# Patient Record
Sex: Female | Born: 1980 | Race: White | Hispanic: No | Smoking: Current every day smoker
Health system: Southern US, Community
[De-identification: ages and names within clinical notes are randomized; demographics above are authoritative.]

## PROBLEM LIST (undated history)

## (undated) DIAGNOSIS — F419 Anxiety disorder, unspecified: Secondary | ICD-10-CM

## (undated) DIAGNOSIS — B1920 Unspecified viral hepatitis C without hepatic coma: Secondary | ICD-10-CM

## (undated) DIAGNOSIS — F909 Attention-deficit hyperactivity disorder, unspecified type: Secondary | ICD-10-CM

## (undated) DIAGNOSIS — J45909 Unspecified asthma, uncomplicated: Secondary | ICD-10-CM

## (undated) DIAGNOSIS — F1199 Opioid use, unspecified with unspecified opioid-induced disorder: Secondary | ICD-10-CM

## (undated) DIAGNOSIS — K589 Irritable bowel syndrome without diarrhea: Secondary | ICD-10-CM

## (undated) DIAGNOSIS — F4310 Post-traumatic stress disorder, unspecified: Secondary | ICD-10-CM

## (undated) DIAGNOSIS — F1190 Opioid use, unspecified, uncomplicated: Secondary | ICD-10-CM

## (undated) HISTORY — DX: Opioid use, unspecified, uncomplicated: F11.90

## (undated) HISTORY — DX: Attention-deficit hyperactivity disorder, unspecified type: F90.9

## (undated) HISTORY — DX: Opioid use, unspecified with unspecified opioid-induced disorder: F11.99

---

## 2003-12-31 HISTORY — PX: BREAST REDUCTION SURGERY: SHX8

## 2005-12-23 ENCOUNTER — Emergency Department: Payer: Self-pay | Admitting: Emergency Medicine

## 2007-06-21 ENCOUNTER — Emergency Department: Payer: Self-pay | Admitting: Emergency Medicine

## 2007-11-07 ENCOUNTER — Emergency Department: Payer: Self-pay | Admitting: Unknown Physician Specialty

## 2008-01-15 ENCOUNTER — Emergency Department: Payer: Self-pay | Admitting: Emergency Medicine

## 2008-01-18 ENCOUNTER — Emergency Department: Payer: Self-pay | Admitting: Emergency Medicine

## 2008-01-29 ENCOUNTER — Ambulatory Visit: Payer: Self-pay | Admitting: Unknown Physician Specialty

## 2008-02-01 ENCOUNTER — Ambulatory Visit: Payer: Self-pay | Admitting: Unknown Physician Specialty

## 2008-02-13 ENCOUNTER — Emergency Department: Payer: Self-pay | Admitting: Emergency Medicine

## 2008-02-28 ENCOUNTER — Ambulatory Visit: Payer: Self-pay | Admitting: Unknown Physician Specialty

## 2008-03-30 ENCOUNTER — Ambulatory Visit: Payer: Self-pay | Admitting: Unknown Physician Specialty

## 2008-04-07 ENCOUNTER — Emergency Department: Payer: Self-pay | Admitting: Emergency Medicine

## 2008-04-07 ENCOUNTER — Other Ambulatory Visit: Payer: Self-pay

## 2009-03-02 ENCOUNTER — Emergency Department (HOSPITAL_COMMUNITY): Admission: EM | Admit: 2009-03-02 | Discharge: 2009-03-02 | Payer: Self-pay | Admitting: Emergency Medicine

## 2010-05-05 ENCOUNTER — Inpatient Hospital Stay: Payer: Self-pay | Admitting: Psychiatry

## 2010-08-31 ENCOUNTER — Emergency Department (HOSPITAL_COMMUNITY): Admission: EM | Admit: 2010-08-31 | Discharge: 2010-08-31 | Payer: Self-pay | Admitting: Family Medicine

## 2010-09-01 ENCOUNTER — Emergency Department (HOSPITAL_COMMUNITY): Admission: EM | Admit: 2010-09-01 | Discharge: 2010-09-01 | Payer: Self-pay | Admitting: Family Medicine

## 2011-01-24 ENCOUNTER — Ambulatory Visit (HOSPITAL_COMMUNITY)
Admission: RE | Admit: 2011-01-24 | Discharge: 2011-01-24 | Payer: Self-pay | Source: Home / Self Care | Attending: Family Medicine | Admitting: Family Medicine

## 2011-01-30 ENCOUNTER — Ambulatory Visit: Admit: 2011-01-30 | Payer: Self-pay | Admitting: Obstetrics & Gynecology

## 2011-01-30 ENCOUNTER — Other Ambulatory Visit: Payer: Self-pay | Admitting: Obstetrics & Gynecology

## 2011-01-30 ENCOUNTER — Encounter: Payer: Self-pay | Admitting: Obstetrics & Gynecology

## 2011-01-30 ENCOUNTER — Other Ambulatory Visit (HOSPITAL_COMMUNITY)
Admission: RE | Admit: 2011-01-30 | Discharge: 2011-01-30 | Disposition: A | Discharge status: Home / Self Care | Payer: Self-pay | Source: Ambulatory Visit | Attending: Obstetrics & Gynecology | Admitting: Obstetrics & Gynecology

## 2011-01-30 DIAGNOSIS — Z01419 Encounter for gynecological examination (general) (routine) without abnormal findings: Secondary | ICD-10-CM | POA: Insufficient documentation

## 2011-02-13 ENCOUNTER — Emergency Department (HOSPITAL_COMMUNITY)
Admission: EM | Admit: 2011-02-13 | Discharge: 2011-02-14 | Disposition: A | Discharge status: Home / Self Care | Payer: Self-pay | Attending: Emergency Medicine | Admitting: Emergency Medicine

## 2011-02-13 DIAGNOSIS — R1110 Vomiting, unspecified: Secondary | ICD-10-CM | POA: Insufficient documentation

## 2011-02-13 DIAGNOSIS — F1910 Other psychoactive substance abuse, uncomplicated: Secondary | ICD-10-CM | POA: Insufficient documentation

## 2011-02-13 DIAGNOSIS — R1013 Epigastric pain: Secondary | ICD-10-CM | POA: Insufficient documentation

## 2011-02-13 LAB — URINALYSIS, ROUTINE W REFLEX MICROSCOPIC
Hgb urine dipstick: NEGATIVE
Ketones, ur: NEGATIVE mg/dL
Protein, ur: 30 mg/dL — AB
Urine Glucose, Fasting: NEGATIVE mg/dL

## 2011-02-13 LAB — DIFFERENTIAL
Basophils Absolute: 0 10*3/uL (ref 0.0–0.1)
Basophils Relative: 1 % (ref 0–1)
Eosinophils Absolute: 0.1 10*3/uL (ref 0.0–0.7)
Monocytes Absolute: 0.6 10*3/uL (ref 0.1–1.0)
Monocytes Relative: 13 % — ABNORMAL HIGH (ref 3–12)
Neutrophils Relative %: 45 % (ref 43–77)

## 2011-02-13 LAB — COMPREHENSIVE METABOLIC PANEL
Albumin: 4 g/dL (ref 3.5–5.2)
BUN: 12 mg/dL (ref 6–23)
Creatinine, Ser: 0.69 mg/dL (ref 0.4–1.2)
Total Protein: 7.1 g/dL (ref 6.0–8.3)

## 2011-02-13 LAB — CBC
MCH: 31.6 pg (ref 26.0–34.0)
MCHC: 34.8 g/dL (ref 30.0–36.0)
Platelets: 364 10*3/uL (ref 150–400)
RBC: 4.31 MIL/uL (ref 3.87–5.11)

## 2011-02-13 LAB — RAPID URINE DRUG SCREEN, HOSP PERFORMED
Barbiturates: NOT DETECTED
Benzodiazepines: NOT DETECTED
Cocaine: NOT DETECTED
Opiates: NOT DETECTED

## 2011-02-13 LAB — PREGNANCY, URINE: Preg Test, Ur: NEGATIVE

## 2011-02-13 LAB — URINE MICROSCOPIC-ADD ON

## 2011-02-13 LAB — ETHANOL: Alcohol, Ethyl (B): 8 mg/dL (ref 0–10)

## 2011-02-14 DIAGNOSIS — F1010 Alcohol abuse, uncomplicated: Secondary | ICD-10-CM

## 2011-03-22 NOTE — Progress Notes (Signed)
NAME:  Taylor Wiggins, Taylor Wiggins NO.:  [id number]  MEDICAL RECORD NO.:  [id number]           PATIENT TYPE:  A  LOCATION:  [facility]                   FACILITY:  [facility]  PHYSICIAN:  Scheryl Darter, MD       DATE OF BIRTH:  [birth year]  DATE OF SERVICE:                                 CLINIC NOTE  The patient comes to establish care for Pap smears and to discuss contraception.  The patient is a 30 year old white female gravida 1, para 0-0-1-0, last menstrual period December 24, 2010.  She was using condoms for contraception.  She is only having intercourse infrequently and four times she had abstaining from sex.  She had looked into having Mirena, but since she is not delivered a baby yet, she would prefer to not use that method.  PAST MEDICAL HISTORY: 1. Asthma. 2. Depression. 3. Heart murmur.  PAST SURGICAL HISTORY: 1. Breast reduction. 2. Termination of pregnancy 4 years ago.  MEDICATIONS:  None.  No known drug allergies.  Family history of alcoholism and diabetes, blood clots in her grandmother and heart disease and hypertension.  SOCIAL HISTORY:  The patient is single.  She currently lives with friends.  She smokes about half pack of cigarettes a day.  She denies alcohol or drug use.  She used to drink alcohol and has quit.  REVIEW OF SYSTEMS:  She is somewhat irregular, but only occasionally a weekly.  Periods are not heavy.  She occasionally needs to take an Advil because of cramps.  No abnormal discharge.  No urinary symptoms.  PHYSICAL EXAMINATION:  GENERAL:  Patient in no acute distress.  Affect appears normal. VITAL SIGNS:  Weight is 132.1 pounds, blood pressure 107/80, pulse 78, temperature 98.6. CHEST:  Clear. HEART:  Regular rhythm.  No thyromegaly. BREASTS:  Symmetric with well-healed scars from breast reduction surgery.  No masses or tenderness. ABDOMEN:  Flat, soft, nontender.  No mass. EXTREMITIES:  Show no swelling. PELVIC:  External  genitalia, vagina and cervix appeared normal.  Pap was done.  Uterus normal size.  No adnexal masses or tenderness.  IMPRESSION: 1. Normal gynecologic exam. 2. Desire for contraception.  PLAN:  We discussed different methods of contraception including hormonal method.  She says she is satisfied with using condoms at this time.  I recommended she use spermicide as well.  Also recommend that if she does get on another method of birth control like oral contraceptives, she should continue to use condoms to reduce the risk of sexually transmitted diseases.  She should have yearly exams and Pap test.  She will be notified of the result of today's Pap.     Scheryl Darter, MD    JA/MEDQ  D:  01/30/2011  T:  01/31/2011  Job:  [id number]

## 2011-04-11 LAB — RAPID URINE DRUG SCREEN, HOSP PERFORMED
Barbiturates: NOT DETECTED
Benzodiazepines: NOT DETECTED

## 2011-04-11 LAB — CBC
HCT: 39.5 % (ref 36.0–46.0)
Hemoglobin: 13.7 g/dL (ref 12.0–15.0)
MCV: 95.6 fL (ref 78.0–100.0)
Platelets: 367 10*3/uL (ref 150–400)
RBC: 4.13 MIL/uL (ref 3.87–5.11)
WBC: 5.3 10*3/uL (ref 4.0–10.5)

## 2011-04-11 LAB — POCT I-STAT, CHEM 8
Calcium, Ion: 1.07 mmol/L — ABNORMAL LOW (ref 1.12–1.32)
Creatinine, Ser: 0.8 mg/dL (ref 0.4–1.2)
Glucose, Bld: 87 mg/dL (ref 70–99)
Hemoglobin: 14.6 g/dL (ref 12.0–15.0)
TCO2: 27 mmol/L (ref 0–100)

## 2011-04-11 LAB — DIFFERENTIAL
Eosinophils Absolute: 0 10*3/uL (ref 0.0–0.7)
Eosinophils Relative: 0 % (ref 0–5)
Lymphs Abs: 2 10*3/uL (ref 0.7–4.0)
Monocytes Absolute: 0.4 10*3/uL (ref 0.1–1.0)
Monocytes Relative: 7 % (ref 3–12)

## 2011-04-11 LAB — URINALYSIS, ROUTINE W REFLEX MICROSCOPIC
Glucose, UA: NEGATIVE mg/dL
Protein, ur: NEGATIVE mg/dL

## 2011-05-17 ENCOUNTER — Other Ambulatory Visit: Payer: Self-pay | Admitting: Obstetrics and Gynecology

## 2011-05-17 ENCOUNTER — Ambulatory Visit (INDEPENDENT_AMBULATORY_CARE_PROVIDER_SITE_OTHER): Payer: Self-pay | Admitting: Obstetrics and Gynecology

## 2011-05-17 DIAGNOSIS — Z7251 High risk heterosexual behavior: Secondary | ICD-10-CM

## 2011-05-17 NOTE — Group Therapy Note (Signed)
NAMEALEGRA, ROST NO.:  [id number]  MEDICAL RECORD NO.:  [id number]           PATIENT TYPE:  A  LOCATION:  [facility]                   FACILITY:  [facility]  PHYSICIAN:  Catalina Antigua, MD     DATE OF BIRTH:  10-05-1981  DATE OF SERVICE:  05/17/2011                                 CLINIC NOTE  HISTORY OF PRESENT ILLNESS:  This is a 30 year old G1, P 0-0-1-0 with an LMP of March 16, 2011 who presents today requesting STD testing and discussion of birth control.  She is currently sexually active using condoms occasionally but does desire the use of other forms of birth control, particularly the oral contraceptive pills.  The patient is also requesting the evaluation of a cyst on her groin and states it has been present for the past couple of weeks but has progressively been decreasing in size and is nontender to touch.  PHYSICAL EXAMINATION:  VITAL SIGNS:  Her blood pressure is 110/81, pulse of 91, weight of 59.6 kg, and height of 67-1/2 inches. ABDOMEN:  Soft, nontender, and nondistended. PELVIC:  She has a 5-mm lesion located on the right aspect of her mons on the inferior aspect that is consistent with her ingrown hair.  It is nontender to touch, nonfluctuant.  She had normal vaginal mucosa and normal-appearing cervix.  No abnormal bleeding or discharge.  ASSESSMENT AND PLAN:  This is a 30 year old G1, P 0-0-1-0 who is here for initiation of birth control and STD testing.  GC and Chlamydia cultures were collected.  Blood work will be collected for hepatitis B, hepatitis C, syphilis, and HIV testing.  The patient does report that she has been under a lot of stress lately which may explain her irregular period as her urine pregnancy test has been negative.  The patient desires to be initiated on oral contraceptive pills.  The patient has no risk factors and therefore a prescription for Cyclessa is provided.  The patient will follow up on a p.r.n. basis and in  February for her annual exam.          ______________________________ Catalina Antigua, MD    PC/MEDQ  D:  05/17/2011  T:  05/17/2011  Job:  [id number]

## 2011-07-23 ENCOUNTER — Other Ambulatory Visit (HOSPITAL_COMMUNITY): Admission: RE | Admit: 2011-07-23 | Payer: Self-pay | Source: Ambulatory Visit | Admitting: Internal Medicine

## 2011-07-23 ENCOUNTER — Other Ambulatory Visit: Payer: Self-pay | Admitting: Family Medicine

## 2011-07-29 ENCOUNTER — Telehealth: Payer: Self-pay

## 2011-07-29 NOTE — Telephone Encounter (Signed)
Called pt and left message.  Pt called and said she was returning our call.

## 2011-08-01 NOTE — Telephone Encounter (Signed)
Pt never returned our call and no notes documented in the chart indicating what the call could be about.  Message left to call if any further concerns.

## 2012-02-15 ENCOUNTER — Emergency Department: Payer: Self-pay | Admitting: Emergency Medicine

## 2012-02-15 LAB — COMPREHENSIVE METABOLIC PANEL
Alkaline Phosphatase: 43 U/L — ABNORMAL LOW (ref 50–136)
Anion Gap: 15 (ref 7–16)
BUN: 11 mg/dL (ref 7–18)
Chloride: 104 mmol/L (ref 98–107)
Co2: 23 mmol/L (ref 21–32)
Creatinine: 0.59 mg/dL — ABNORMAL LOW (ref 0.60–1.30)
EGFR (African American): >60
EGFR (Non-African Amer.): >60
Potassium: 3.7 mmol/L (ref 3.5–5.1)
SGOT(AST): 27 U/L (ref 15–37)
Sodium: 142 mmol/L (ref 136–145)
Total Protein: 8.1 g/dL (ref 6.4–8.2)

## 2012-02-15 LAB — DRUG SCREEN, URINE
Amphetamines, Ur Screen: NEGATIVE (ref ?–1000)
Cocaine Metabolite,Ur ~~LOC~~: NEGATIVE (ref ?–300)
MDMA (Ecstasy)Ur Screen: NEGATIVE (ref ?–500)
Opiate, Ur Screen: NEGATIVE (ref ?–300)
Phencyclidine (PCP) Ur S: NEGATIVE (ref ?–25)
Tricyclic, Ur Screen: NEGATIVE (ref ?–1000)

## 2012-02-15 LAB — CBC
HCT: 39.1 % (ref 35.0–47.0)
MCV: 97 fL (ref 80–100)
Platelet: 367 10*3/uL (ref 150–440)
RBC: 4.04 10*6/uL (ref 3.80–5.20)
WBC: 10.1 10*3/uL (ref 3.6–11.0)

## 2012-02-15 LAB — ETHANOL
Ethanol %: 0.199 % — ABNORMAL HIGH (ref 0.000–0.080)
Ethanol: 326 mg/dL
Ethanol: 199 mg/dL

## 2012-02-15 LAB — SALICYLATE LEVEL: Salicylates, Serum: <1.7 mg/dL

## 2012-02-15 LAB — TSH: Thyroid Stimulating Horm: 1.11 u[IU]/mL

## 2012-05-24 IMAGING — US US ABDOMEN COMPLETE
2 series · 13 of 25 positions shown · non-contrast
Comparison: None

CLINICAL DATA: Abdominal pain.

ABDOMINAL ULTRASOUND COMPLETE

[Series 1: us abdomen complete · 0.26mm/px · 12 of 66 slices shown (1 of 2)]
[im 1/66]
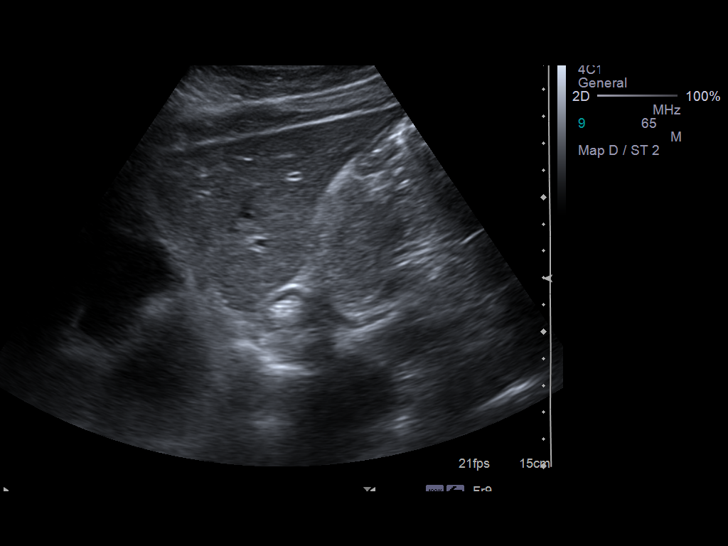
[im 6/66]
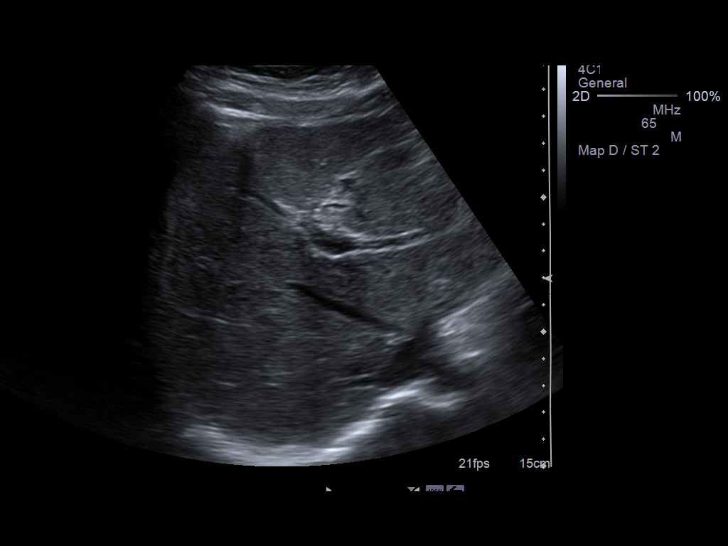
[im 12/66]
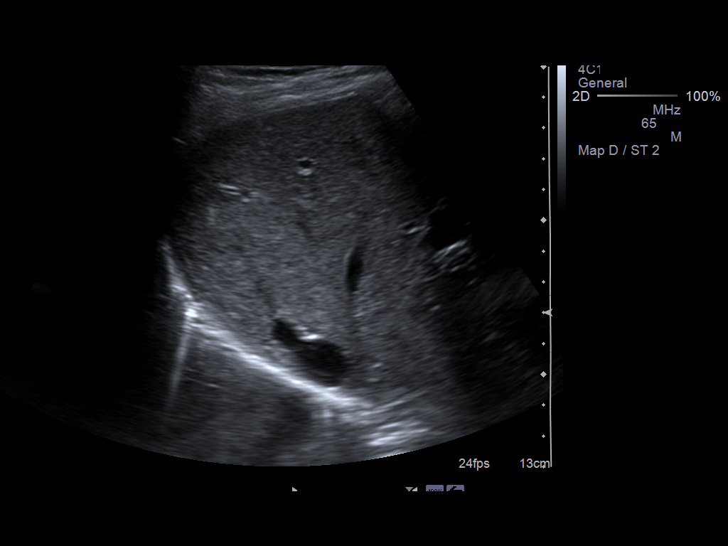
[im 17/66]
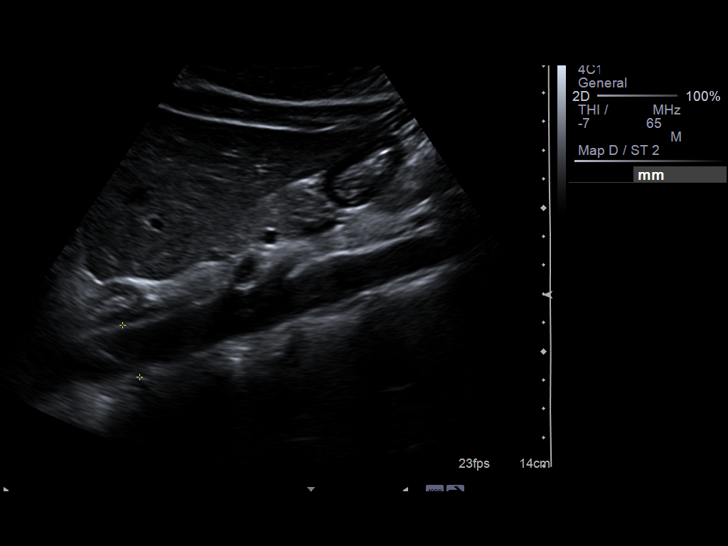
[im 23/66]
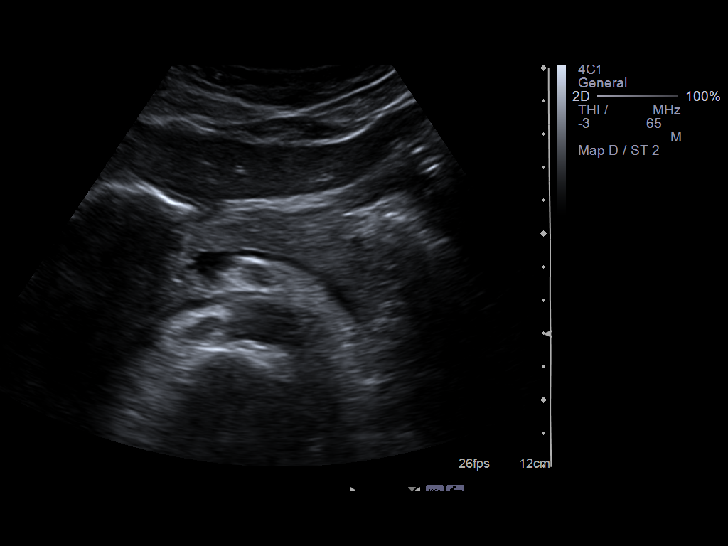
[im 29/66]
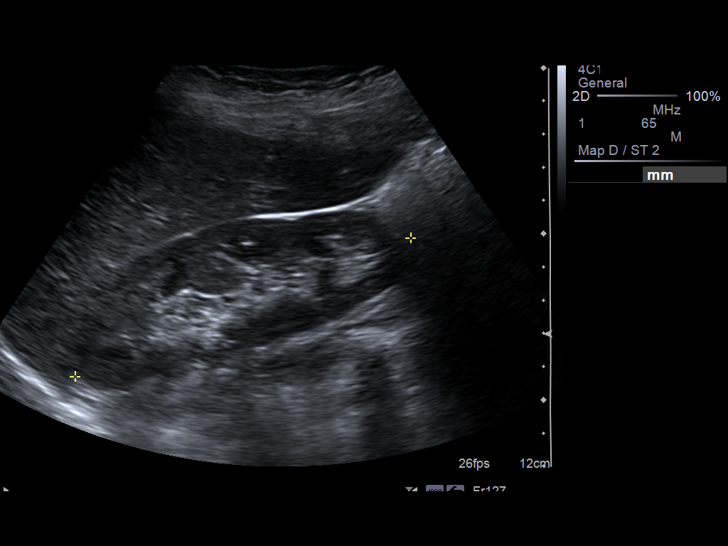
[im 34/66]
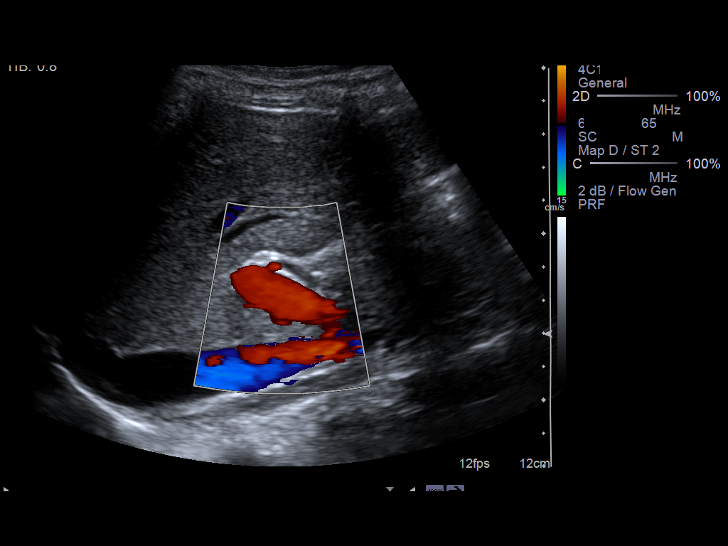
[im 40/66]
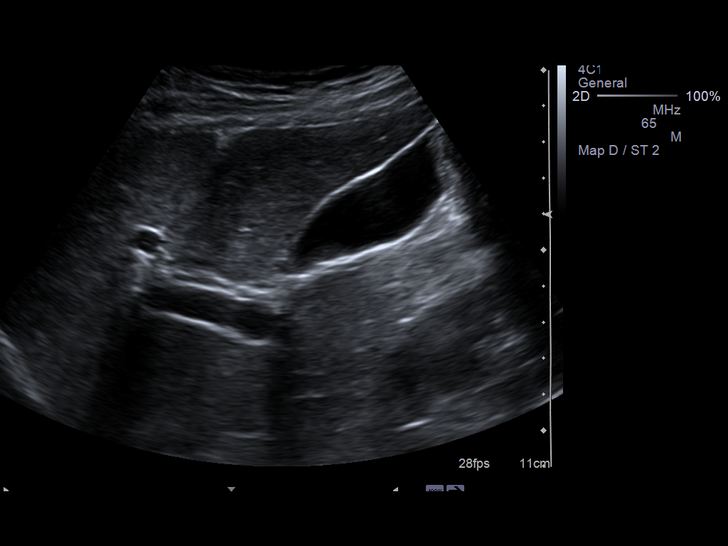
[im 46/66]
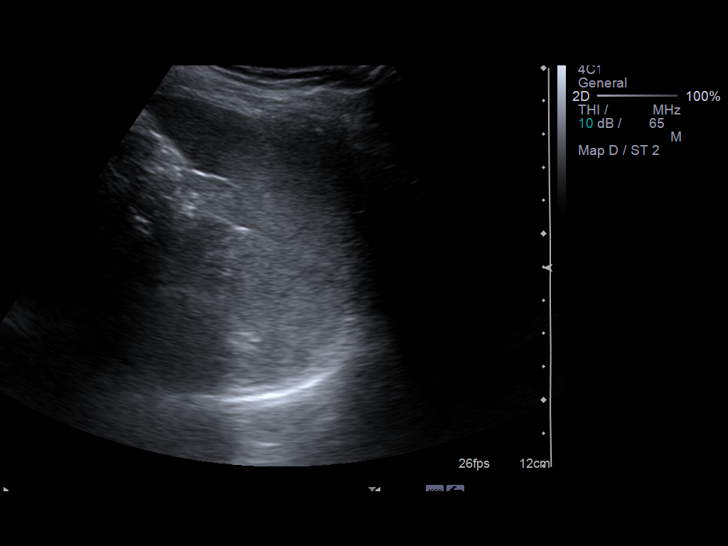
[im 51/66]
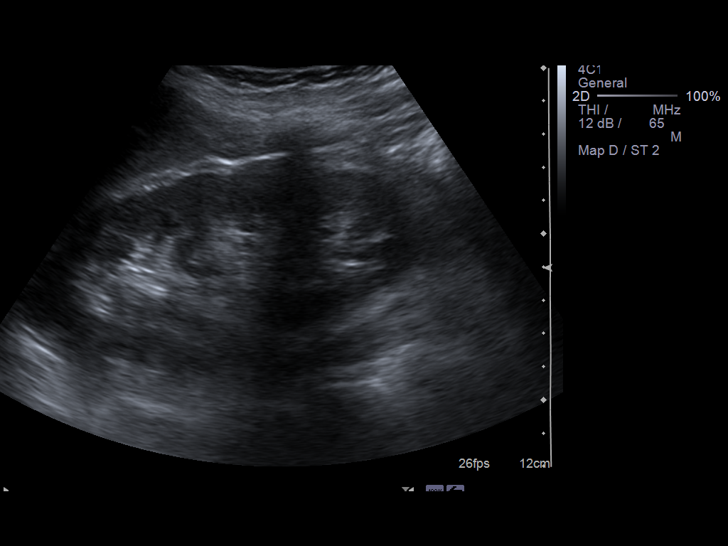
[im 57/66]
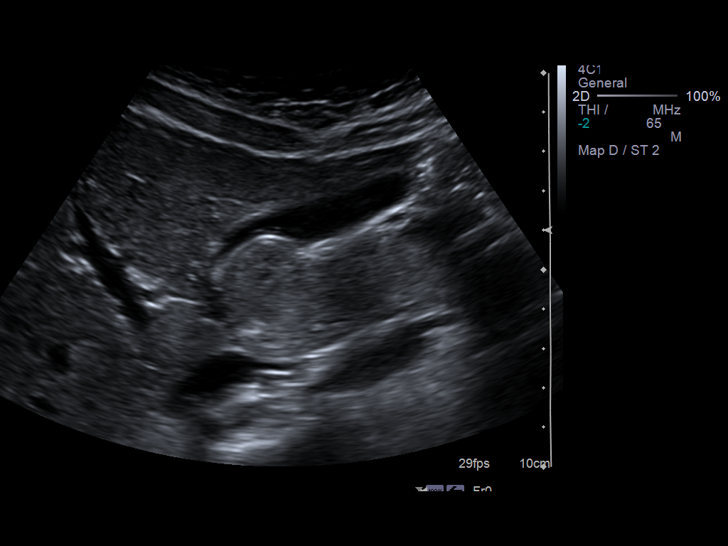
[im 63/66]
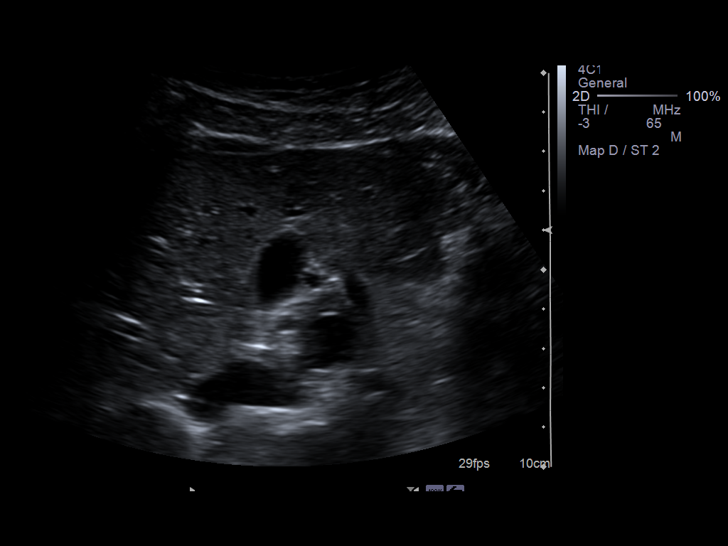

[Series 2: us abdomen complete · 0.21mm/px · 1 of 2 slices shown (2 of 2)]
[im 1/2]
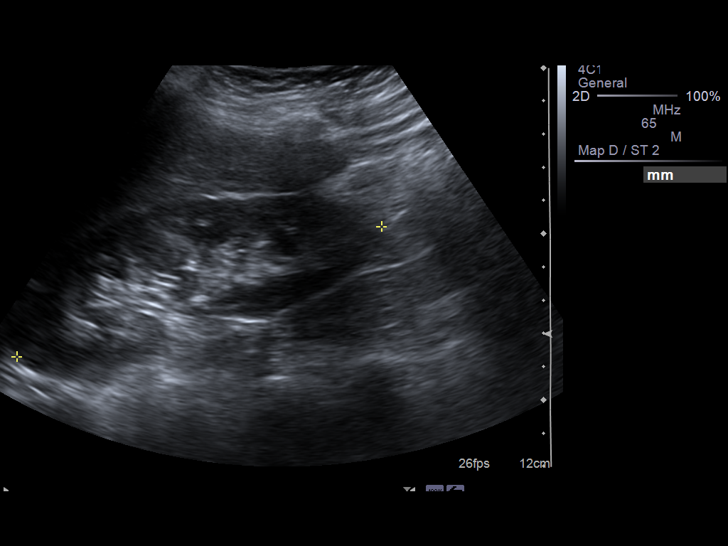

[13 of 25 positions shown; findings below may reference images not displayed]

FINDINGS: Gallbladder:  The gallbladder is unremarkable. There is no evidence
of gallstones, gallbladder wall thickening, or pericholecystic
fluid.

Common Bile Duct:  There is no evidence of intrahepatic or
extrahepatic biliary dilation. The CBD measures 3.5 mm in greatest
diameter.

Liver:  The liver is within normal limits in parenchymal
echogenicity. No focal abnormalities are identified.

IVC:  Appears normal.

Pancreas:  Although the pancreas is difficult to visualize in its
entirety, no focal pancreatic abnormality is identified.

Spleen:  Within normal limits in size and echotexture.

Right kidney:  The right kidney is normal in size and parenchymal
echogenicity.  There is no evidence of solid mass, hydronephrosis
or definite renal calculi.  The right kidney measures 11.7 cm.

Left kidney:  The left kidney is normal in size and parenchymal
echogenicity.  There is no evidence of solid mass, hydronephrosis
or definite renal calculi.  A 2 x 3 cm upper pole cyst is noted.
The left kidney measures 12.6 cm.

Abdominal Aorta:  No abdominal aortic aneurysm identified.

There is no evidence of ascites.
IMPRESSION: Left renal cyst otherwise negative abdominal
ultrasound.

## 2012-05-26 ENCOUNTER — Emergency Department: Payer: Self-pay | Admitting: Emergency Medicine

## 2012-08-24 ENCOUNTER — Encounter (HOSPITAL_COMMUNITY): Payer: Self-pay | Admitting: *Deleted

## 2012-08-24 ENCOUNTER — Emergency Department (INDEPENDENT_AMBULATORY_CARE_PROVIDER_SITE_OTHER)
Admission: EM | Admit: 2012-08-24 | Discharge: 2012-08-24 | Disposition: A | Discharge status: Home / Self Care | Payer: Self-pay | Source: Home / Self Care | Attending: Emergency Medicine | Admitting: Emergency Medicine

## 2012-08-24 DIAGNOSIS — M549 Dorsalgia, unspecified: Secondary | ICD-10-CM

## 2012-08-24 MED ORDER — CYCLOBENZAPRINE HCL 10 MG PO TABS: 10.0000 mg | ORAL_TABLET | Freq: Two times a day (BID) | ORAL | Status: AC | PRN

## 2012-08-24 MED ORDER — HYDROCODONE-ACETAMINOPHEN 5-325 MG PO TABS: 2.0000 | ORAL_TABLET | ORAL | Status: AC | PRN

## 2012-08-24 NOTE — ED Notes (Signed)
Pt reports " I moved a couch yesterday and I pulled something in my back"

## 2012-08-24 NOTE — ED Provider Notes (Signed)
History     CSN: [id number]  Arrival date & time 08/24/12  1556   First MD Initiated Contact with Patient 08/24/12 1757      Chief Complaint  Patient presents with  . Back Pain    (Consider location/radiation/quality/duration/timing/severity/associated sxs/prior treatment) Patient is a 31 y.o. female presenting with back pain. The history is provided by the patient. No language interpreter was used.  Back Pain  This is a new problem. The current episode started yesterday. The problem occurs constantly. The problem has not changed since onset.The pain is associated with no known injury. The pain is present in the thoracic spine. The quality of the pain is described as aching. The pain is at a severity of 6/10. The pain is moderate. The pain is worse during the day. She has tried nothing for the symptoms. The treatment provided moderate relief.    History reviewed. No pertinent past medical history.  History reviewed. No pertinent past surgical history.  History reviewed. No pertinent family history.  History  Substance Use Topics  . Smoking status: Current Some Day Smoker -- 0.5 packs/day    Types: Cigarettes  . Smokeless tobacco: Not on file  . Alcohol Use: Yes     socially    OB History    Grav Para Term Preterm Abortions TAB SAB Ect Mult Living                  Review of Systems  Musculoskeletal: Positive for back pain.  All other systems reviewed and are negative.    Allergies  Review of patient's allergies indicates no known allergies.  Home Medications  No current outpatient prescriptions on file.  BP 119/80  Pulse 82  Temp 98.1 F (36.7 C) (Oral)  Resp 18  SpO2 99%  LMP 07/20/2012  Physical Exam  Nursing note and vitals reviewed. Constitutional: She is oriented to person, place, and time. She appears well-developed and well-nourished.  HENT:  Head: Normocephalic and atraumatic.  Right Ear: External ear normal.  Left Ear: External ear normal.    Eyes: Conjunctivae are normal. Pupils are equal, round, and reactive to light.  Neck: Normal range of motion. Neck supple.  Cardiovascular: Normal rate, regular rhythm and normal heart sounds.   Pulmonary/Chest: Effort normal and breath sounds normal.  Abdominal: Soft. Bowel sounds are normal.  Musculoskeletal: Normal range of motion.  Neurological: She is alert and oriented to person, place, and time. She has normal reflexes.  Skin: Skin is warm.  Psychiatric: She has a normal mood and affect.    ED Course  Procedures (including critical care time)  Labs Reviewed - No data to display No results found.   No diagnosis found.    MDM  Pt given flexeril and hydrocodone         Lonia Skinner [place], [place] 08/24/12 1829

## 2012-08-24 NOTE — ED Provider Notes (Signed)
Medical screening examination/treatment/procedure(s) were performed by non-physician practitioner and as supervising physician I was immediately available for consultation/collaboration.  Leslee Home, M.D.   Reuben Likes, MD 08/24/12 [phone]

## 2012-08-25 ENCOUNTER — Telehealth (HOSPITAL_COMMUNITY): Payer: Self-pay | Admitting: *Deleted

## 2012-08-25 NOTE — ED Notes (Signed)
Pt. called and said she is having itching from the Hydrocodone and its not helping the pain.  I told her I would call back.  Discussed with Dr. Lorenz Coaster and he was made aware the pt. 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  He said to tell pt. The itching is not necessarily an allergic reaction. It can be a side effect.  He suggests she take Benadryl before she has to take it, she can try breaking it in half but he is not giving anything stronger.  If not relieving her pain she can come back but he doubts they will give her anything stronger.  I called pt. and told her this.  She said she would try the Benadryl. Vassie Moselle 08/25/2012

## 2012-10-01 ENCOUNTER — Encounter (HOSPITAL_COMMUNITY): Payer: Self-pay | Admitting: *Deleted

## 2012-10-01 ENCOUNTER — Emergency Department (INDEPENDENT_AMBULATORY_CARE_PROVIDER_SITE_OTHER)
Admission: EM | Admit: 2012-10-01 | Discharge: 2012-10-01 | Disposition: A | Discharge status: Home / Self Care | Payer: Self-pay | Source: Home / Self Care

## 2012-10-01 DIAGNOSIS — M545 Low back pain: Secondary | ICD-10-CM

## 2012-10-01 MED ORDER — CYCLOBENZAPRINE HCL 5 MG PO TABS: 5.0000 mg | ORAL_TABLET | Freq: Two times a day (BID) | ORAL | Status: DC | PRN

## 2012-10-01 MED ORDER — HYDROCODONE-ACETAMINOPHEN 5-325 MG PO TABS: 1.0000 | ORAL_TABLET | ORAL | Status: DC | PRN

## 2012-10-01 NOTE — ED Notes (Signed)
Pt states that back pain has not gotten better since last visit and does not know what to do - ask for possible referral.

## 2012-10-01 NOTE — ED Provider Notes (Signed)
Medical screening examination/treatment/procedure(s) were performed by non-physician practitioner and as supervising physician I was immediately available for consultation/collaboration.  Raynald Blend, MD 10/01/12 1747

## 2012-10-01 NOTE — ED Provider Notes (Signed)
History     CSN: [id number]  Arrival date & time 10/01/12  1555   None     Chief Complaint  Patient presents with  . Back Pain    (Consider location/radiation/quality/duration/timing/severity/associated sxs/prior treatment) HPI Comments: 31 year old female with chief complaint of low back pain. She's had this pain for approximately one month. She was seen in the urgent care by Dr. Lorenz Coaster at the end of August and treated conservatively with narcotics and instructions. She has since been continuing to move at work sideways it aggravates her lower back pain. The pain is located across the lower lumbar para musculature is worse with bending stooping lifting carrying. She also states her some sharp pains that she down the left thigh. Denies numbness focal weakness, loss control of bowel or bladder. No known blunt trauma fall or other type injury.   History reviewed. No pertinent past medical history.  History reviewed. No pertinent past surgical history.  Family History  Problem Relation Age of Onset  . Family history unknown: Yes    History  Substance Use Topics  . Smoking status: Current Some Day Smoker -- 0.5 packs/day    Types: Cigarettes  . Smokeless tobacco: Not on file  . Alcohol Use: Yes     socially    OB History    Grav Para Term Preterm Abortions TAB SAB Ect Mult Living                  Review of Systems  Constitutional: Negative for fever, chills and activity change.  HENT: Negative.   Respiratory: Negative.   Cardiovascular: Negative.   Musculoskeletal:       As per HPI  Skin: Negative for color change, pallor and rash.  Neurological: Negative.     Allergies  Review of patient's allergies indicates no known allergies.  Home Medications   Current Outpatient Rx  Name Route Sig Dispense Refill  . CYCLOBENZAPRINE HCL 5 MG PO TABS Oral Take 1 tablet (5 mg total) by mouth 2 (two) times daily as needed for muscle spasms. 20 tablet 0  .  HYDROCODONE-ACETAMINOPHEN 5-325 MG PO TABS Oral Take 1 tablet by mouth every 4 (four) hours as needed for pain. 10 tablet 0    BP 117/79  Pulse 71  Temp 97.5 F (36.4 C) (Oral)  Resp 18  SpO2 99%  LMP 09/01/2012  Physical Exam  Constitutional: She is oriented to person, place, and time. She appears well-developed and well-nourished. No distress.  HENT:  Head: Normocephalic and atraumatic.  Eyes: EOM are normal. Pupils are equal, round, and reactive to light.  Neck: Normal range of motion. Neck supple.  Musculoskeletal:       Tender across the bilateral paralumbar musculature. No spinal tenderness. Flexion beyond 90 without pain. Lower extremities motor and sensory is intact  Lymphadenopathy:    She has no cervical adenopathy.  Neurological: She is alert and oriented to person, place, and time. No cranial nerve deficit.  Skin: Skin is warm and dry. No rash noted.    ED Course  Procedures (including critical care time)  Labs Reviewed - No data to display No results found.   1. Lumbago       MDM  I discussed stretches for her back. She states she does not really want to do that. She is willing to accept a referral. And she also wants something for pain. We'll refer her to on call orthopedist Dr. Ranell Patrick. She will benefit from physical therapy. To 1Q4  to 6 hours when necessary pain #10 Cyclobenzaprine 5 mg twice a day at home when necessary muscle spasms. Continued the ibuprofen or Aleve. No bending lifting pulling and lifting overhead or other movements exacerbate lower back pain.         Hayden Rasmussen, NP 10/01/12 1728

## 2012-10-19 ENCOUNTER — Encounter (HOSPITAL_COMMUNITY): Payer: Self-pay

## 2012-10-19 ENCOUNTER — Emergency Department (INDEPENDENT_AMBULATORY_CARE_PROVIDER_SITE_OTHER)
Admission: EM | Admit: 2012-10-19 | Discharge: 2012-10-19 | Disposition: A | Discharge status: Home / Self Care | Payer: Self-pay | Source: Home / Self Care | Attending: Family Medicine | Admitting: Family Medicine

## 2012-10-19 DIAGNOSIS — K219 Gastro-esophageal reflux disease without esophagitis: Secondary | ICD-10-CM

## 2012-10-19 MED ORDER — PANTOPRAZOLE SODIUM 40 MG PO TBEC: 40.0000 mg | DELAYED_RELEASE_TABLET | Freq: Every day | ORAL | Status: DC

## 2012-10-19 MED ORDER — GI COCKTAIL ~~LOC~~
ORAL | Status: AC
  Filled 2012-10-19: qty 30

## 2012-10-19 NOTE — ED Provider Notes (Signed)
History     CSN: [id number]  Arrival date & time 10/19/12  1621   First MD Initiated Contact with Patient 10/19/12 1637      Chief Complaint  Patient presents with  . Heartburn    (Consider location/radiation/quality/duration/timing/severity/associated sxs/prior treatment) Patient is a 31 y.o. female presenting with heartburn. The history is provided by the patient.  Heartburn This is a new problem. The current episode started more than 1 week ago. The problem has been gradually worsening. Associated symptoms include abdominal pain. The symptoms are aggravated by swallowing and stress (stil with back pain, under a lot of stress from a lot of issues at once.).    History reviewed. No pertinent past medical history.  History reviewed. No pertinent past surgical history.  No family history on file.  History  Substance Use Topics  . Smoking status: Current Some Day Smoker -- 0.5 packs/day    Types: Cigarettes  . Smokeless tobacco: Not on file  . Alcohol Use: Yes     socially    OB History    Grav Para Term Preterm Abortions TAB SAB Ect Mult Living                  Review of Systems  Constitutional: Negative.   Gastrointestinal: Positive for heartburn and abdominal pain.    Allergies  Review of patient's allergies indicates no known allergies.  Home Medications   Current Outpatient Rx  Name Route Sig Dispense Refill  . CYCLOBENZAPRINE HCL 5 MG PO TABS Oral Take 1 tablet (5 mg total) by mouth 2 (two) times daily as needed for muscle spasms. 20 tablet 0  . HYDROCODONE-ACETAMINOPHEN 5-325 MG PO TABS Oral Take 1 tablet by mouth every 4 (four) hours as needed for pain. 10 tablet 0  . PANTOPRAZOLE SODIUM 40 MG PO TBEC Oral Take 1 tablet (40 mg total) by mouth daily. 30 tablet 1    BP 113/77  Pulse 88  Temp 98.1 F (36.7 C) (Oral)  Resp 16  SpO2 100%  LMP 10/15/2012  Physical Exam  Nursing note and vitals reviewed. Constitutional: She appears well-developed  and well-nourished.  Abdominal: Soft. Normal appearance and bowel sounds are normal. She exhibits no distension and no mass. There is no hepatosplenomegaly. There is tenderness in the epigastric area. There is no rigidity, no rebound, no guarding, no CVA tenderness and no tenderness at McBurney's point.  Skin: Skin is warm and dry.    ED Course  Procedures (including critical care time)  Labs Reviewed - No data to display No results found.   1. GERD (gastroesophageal reflux disease)       MDM          Linna Hoff, MD 10/19/12 1930

## 2012-10-19 NOTE — ED Notes (Signed)
Patient states that she has been having acid reflux / heartburn sx since Saturday, has tried Zantac OTC with no relief, also states that she has lower back pain

## 2013-04-25 ENCOUNTER — Emergency Department (HOSPITAL_COMMUNITY)
Admission: EM | Admit: 2013-04-25 | Discharge: 2013-04-26 | Disposition: A | Discharge status: Other Acute Inpatient Hospital | Payer: Self-pay | Attending: Emergency Medicine | Admitting: Emergency Medicine

## 2013-04-25 ENCOUNTER — Encounter (HOSPITAL_COMMUNITY): Payer: Self-pay

## 2013-04-25 DIAGNOSIS — F17200 Nicotine dependence, unspecified, uncomplicated: Secondary | ICD-10-CM | POA: Insufficient documentation

## 2013-04-25 DIAGNOSIS — Z3202 Encounter for pregnancy test, result negative: Secondary | ICD-10-CM | POA: Insufficient documentation

## 2013-04-25 DIAGNOSIS — R45851 Suicidal ideations: Secondary | ICD-10-CM | POA: Insufficient documentation

## 2013-04-25 DIAGNOSIS — Z79899 Other long term (current) drug therapy: Secondary | ICD-10-CM | POA: Insufficient documentation

## 2013-04-25 DIAGNOSIS — F1910 Other psychoactive substance abuse, uncomplicated: Secondary | ICD-10-CM | POA: Insufficient documentation

## 2013-04-25 LAB — COMPREHENSIVE METABOLIC PANEL
AST: 18 U/L (ref 0–37)
Albumin: 4.2 g/dL (ref 3.5–5.2)
Calcium: 9.1 mg/dL (ref 8.4–10.5)
Chloride: 103 mEq/L (ref 96–112)
Creatinine, Ser: 0.7 mg/dL (ref 0.50–1.10)
Sodium: 138 mEq/L (ref 135–145)
Total Bilirubin: 0.1 mg/dL — ABNORMAL LOW (ref 0.3–1.2)

## 2013-04-25 LAB — CBC
MCV: 92.8 fL (ref 78.0–100.0)
Platelets: 432 10*3/uL — ABNORMAL HIGH (ref 150–400)
RDW: 13.5 % (ref 11.5–15.5)
WBC: 6.7 10*3/uL (ref 4.0–10.5)

## 2013-04-25 LAB — RAPID URINE DRUG SCREEN, HOSP PERFORMED
Amphetamines: NOT DETECTED
Cocaine: NOT DETECTED
Opiates: NOT DETECTED
Tetrahydrocannabinol: NOT DETECTED

## 2013-04-25 MED ORDER — NICOTINE 21 MG/24HR TD PT24
21.0000 mg | MEDICATED_PATCH | Freq: Every day | TRANSDERMAL | Status: DC
  Administered 2013-04-25: 21 mg via TRANSDERMAL
  Filled 2013-04-25: qty 1

## 2013-04-25 MED ORDER — ONDANSETRON HCL 4 MG PO TABS: 4.0000 mg | ORAL_TABLET | Freq: Three times a day (TID) | ORAL | Status: DC | PRN

## 2013-04-25 MED ORDER — ZOLPIDEM TARTRATE 5 MG PO TABS
5.0000 mg | ORAL_TABLET | Freq: Every evening | ORAL | Status: DC | PRN
  Administered 2013-04-26: 5 mg via ORAL
  Filled 2013-04-25: qty 1

## 2013-04-25 MED ORDER — IBUPROFEN 600 MG PO TABS: 600.0000 mg | ORAL_TABLET | Freq: Three times a day (TID) | ORAL | Status: DC | PRN

## 2013-04-25 MED ORDER — ACETAMINOPHEN 325 MG PO TABS: 650.0000 mg | ORAL_TABLET | ORAL | Status: DC | PRN

## 2013-04-25 MED ORDER — ALUM & MAG HYDROXIDE-SIMETH 200-200-20 MG/5ML PO SUSP: 30.0000 mL | ORAL | Status: DC | PRN

## 2013-04-25 NOTE — ED Provider Notes (Signed)
History     CSN: [id number]  Arrival date & time 04/25/13  2038   First MD Initiated Contact with Patient 04/25/13 2129      Chief Complaint  Patient presents with  . Medical Clearance    (Consider location/radiation/quality/duration/timing/severity/associated sxs/prior treatment) HPI Pt presenting with c/o alcohol and substance abuse as well as suicidal thoughts.  She states she was in a rehab program for 30 days and was doing better until she relapsed with substance use - states she has been drinking daily.  States she has suicidal thoughts and dreams, she states she feels she is not able to control these thoughts and is afraid she will act on them.  No definite plan.  Denies recent illness, no fever/cough/vomiting/abdominal pain or other symptoms.  There are no other associated systemic symptoms, there are no other alleviating or modifying factors.   History reviewed. No pertinent past medical history.  History reviewed. No pertinent past surgical history.  History reviewed. No pertinent family history.  History  Substance Use Topics  . Smoking status: Current Some Day Smoker -- 0.50 packs/day    Types: Cigarettes  . Smokeless tobacco: Not on file  . Alcohol Use: Yes     Comment: socially    OB History   Grav Para Term Preterm Abortions TAB SAB Ect Mult Living                  Review of Systems ROS reviewed and all otherwise negative except for mentioned in HPI  Allergies  Review of patient's allergies indicates no known allergies.  Home Medications   Current Outpatient Rx  Name  Route  Sig  Dispense  Refill  . diazepam (VALIUM) 2 MG tablet   Oral   Take 2 mg by mouth every 6 (six) hours as needed for anxiety.         . diphenhydrAMINE (BENADRYL ALLERGY) 25 MG tablet   Oral   Take 25 mg by mouth at bedtime as needed (sleep).           BP 112/86  Pulse 108  Temp(Src) 98.6 F (37 C) (Oral)  Resp 18  SpO2 95%  LMP 04/25/2013 Vitals  reviewed Physical Exam Physical Examination: General appearance - alert, well appearing, and in no distress Mental status - alert, oriented to person, place, and time Eyes - no scleral icterus, no conjunctival injection Mouth - mucous membranes moist, pharynx normal without lesions Chest - clear to auscultation, no wheezes, rales or rhonchi, symmetric air entry Heart - normal rate, regular rhythm, normal S1, S2, no murmurs, rubs, clicks or gallops Abdomen - soft, nontender, nondistended, no masses or organomegaly Extremities - peripheral pulses normal, no pedal edema, no clubbing or cyanosis Skin - normal coloration and turgor, no rashes Psych- cooperative, intermittently tearful  ED Course  Procedures (including critical care time)  10:36 PM pt moved to psych ED, d/w ACT team who will assess patient  Labs Reviewed  CBC - Abnormal; Notable for the following:    Platelets 432 (*)    All other components within normal limits  COMPREHENSIVE METABOLIC PANEL - Abnormal; Notable for the following:    Total Bilirubin 0.1 (*)    All other components within normal limits  ETHANOL - Abnormal; Notable for the following:    Alcohol, Ethyl (B) 257 (*)    All other components within normal limits  URINE RAPID DRUG SCREEN (HOSP PERFORMED)  POCT PREGNANCY, URINE   No results found.   1.  Substance abuse   2. Suicidal ideation       MDM  Pt presenting with c/o alcohol and benzo abuse, suicidal thoughts.  She is medically clear, d/w ACT who will see patient and evaluate.  Psych holding orders written and patient moved to psych ED.         Ethelda Chick, MD 04/26/13 [phone]

## 2013-04-25 NOTE — ED Notes (Signed)
Pt was recently released from a treatment center and started drinking again, she states that she feels like she wants to harm herself and she states that she's acting out in public and destroying her property. Pt wants long term inpatient help.

## 2013-04-25 NOTE — ED Notes (Signed)
Pt searched and wanded by security. Pt with 2 belongings bags, located in triage

## 2013-04-25 NOTE — ED Notes (Signed)
Pt to room; no s/s of distress noted at this time.

## 2013-04-26 ENCOUNTER — Inpatient Hospital Stay (HOSPITAL_COMMUNITY)
Admission: AD | Admit: 2013-04-26 | Discharge: 2013-04-28 | DRG: 897 | Disposition: A | Discharge status: Home / Self Care | Payer: No Typology Code available for payment source | Attending: Psychiatry | Admitting: Psychiatry

## 2013-04-26 ENCOUNTER — Encounter (HOSPITAL_COMMUNITY): Payer: Self-pay | Admitting: *Deleted

## 2013-04-26 ENCOUNTER — Encounter (HOSPITAL_COMMUNITY): Payer: Self-pay | Admitting: Emergency Medicine

## 2013-04-26 DIAGNOSIS — Z79899 Other long term (current) drug therapy: Secondary | ICD-10-CM

## 2013-04-26 DIAGNOSIS — R45851 Suicidal ideations: Secondary | ICD-10-CM

## 2013-04-26 DIAGNOSIS — F10239 Alcohol dependence with withdrawal, unspecified: Principal | ICD-10-CM | POA: Diagnosis present

## 2013-04-26 DIAGNOSIS — F10939 Alcohol use, unspecified with withdrawal, unspecified: Principal | ICD-10-CM | POA: Diagnosis present

## 2013-04-26 DIAGNOSIS — F1020 Alcohol dependence, uncomplicated: Secondary | ICD-10-CM | POA: Diagnosis present

## 2013-04-26 MED ORDER — HYDROXYZINE HCL 25 MG PO TABS
25.0000 mg | ORAL_TABLET | Freq: Four times a day (QID) | ORAL | Status: DC | PRN
  Administered 2013-04-26: 25 mg via ORAL
  Filled 2013-04-26: qty 1

## 2013-04-26 MED ORDER — MAGNESIUM HYDROXIDE 400 MG/5ML PO SUSP
30.0000 mL | Freq: Every day | ORAL | Status: DC | PRN
  Administered 2013-04-26: 30 mL via ORAL

## 2013-04-26 MED ORDER — ADULT MULTIVITAMIN W/MINERALS CH
1.0000 | ORAL_TABLET | Freq: Every day | ORAL | Status: DC
  Administered 2013-04-26 – 2013-04-28 (×3): 1 via ORAL
  Filled 2013-04-26 (×5): qty 1

## 2013-04-26 MED ORDER — CHLORDIAZEPOXIDE HCL 25 MG PO CAPS: 25.0000 mg | ORAL_CAPSULE | ORAL | Status: DC

## 2013-04-26 MED ORDER — ONDANSETRON 4 MG PO TBDP: 4.0000 mg | ORAL_TABLET | Freq: Four times a day (QID) | ORAL | Status: DC | PRN

## 2013-04-26 MED ORDER — CLONIDINE HCL 0.1 MG PO TABS: 0.1000 mg | ORAL_TABLET | Freq: Every day | ORAL | Status: DC

## 2013-04-26 MED ORDER — LOPERAMIDE HCL 2 MG PO CAPS: 2.0000 mg | ORAL_CAPSULE | ORAL | Status: DC | PRN

## 2013-04-26 MED ORDER — CHLORDIAZEPOXIDE HCL 25 MG PO CAPS
25.0000 mg | ORAL_CAPSULE | Freq: Three times a day (TID) | ORAL | Status: AC
  Administered 2013-04-27 (×2): 25 mg via ORAL
  Filled 2013-04-26 (×3): qty 1

## 2013-04-26 MED ORDER — CHLORDIAZEPOXIDE HCL 25 MG PO CAPS: 25.0000 mg | ORAL_CAPSULE | Freq: Every day | ORAL | Status: DC

## 2013-04-26 MED ORDER — CLONIDINE HCL 0.1 MG PO TABS: 0.1000 mg | ORAL_TABLET | ORAL | Status: DC

## 2013-04-26 MED ORDER — NAPROXEN 500 MG PO TABS: 500.0000 mg | ORAL_TABLET | Freq: Two times a day (BID) | ORAL | Status: DC | PRN

## 2013-04-26 MED ORDER — DICYCLOMINE HCL 20 MG PO TABS
20.0000 mg | ORAL_TABLET | Freq: Four times a day (QID) | ORAL | Status: DC | PRN
  Administered 2013-04-26: 20 mg via ORAL
  Filled 2013-04-26: qty 1

## 2013-04-26 MED ORDER — DIPHENHYDRAMINE HCL 25 MG PO CAPS
50.0000 mg | ORAL_CAPSULE | Freq: Every evening | ORAL | Status: DC | PRN
  Administered 2013-04-26 – 2013-04-27 (×2): 50 mg via ORAL

## 2013-04-26 MED ORDER — VITAMIN B-1 100 MG PO TABS
100.0000 mg | ORAL_TABLET | Freq: Every day | ORAL | Status: DC
  Administered 2013-04-27 – 2013-04-28 (×2): 100 mg via ORAL
  Filled 2013-04-26 (×4): qty 1

## 2013-04-26 MED ORDER — ONDANSETRON 4 MG PO TBDP
4.0000 mg | ORAL_TABLET | Freq: Four times a day (QID) | ORAL | Status: DC | PRN
  Administered 2013-04-26: 4 mg via ORAL

## 2013-04-26 MED ORDER — METHOCARBAMOL 500 MG PO TABS: 500.0000 mg | ORAL_TABLET | Freq: Three times a day (TID) | ORAL | Status: DC | PRN

## 2013-04-26 MED ORDER — ACETAMINOPHEN 325 MG PO TABS
650.0000 mg | ORAL_TABLET | Freq: Four times a day (QID) | ORAL | Status: DC | PRN
  Administered 2013-04-26 – 2013-04-27 (×2): 650 mg via ORAL

## 2013-04-26 MED ORDER — THIAMINE HCL 100 MG/ML IJ SOLN: 100.0000 mg | Freq: Once | INTRAMUSCULAR | Status: DC

## 2013-04-26 MED ORDER — HYDROXYZINE HCL 25 MG PO TABS: 25.0000 mg | ORAL_TABLET | Freq: Four times a day (QID) | ORAL | Status: DC | PRN

## 2013-04-26 MED ORDER — CHLORDIAZEPOXIDE HCL 25 MG PO CAPS: 25.0000 mg | ORAL_CAPSULE | Freq: Four times a day (QID) | ORAL | Status: DC | PRN

## 2013-04-26 MED ORDER — SERTRALINE HCL 25 MG PO TABS
25.0000 mg | ORAL_TABLET | Freq: Every day | ORAL | Status: DC
  Administered 2013-04-26 – 2013-04-28 (×3): 25 mg via ORAL
  Filled 2013-04-26: qty 1
  Filled 2013-04-26: qty 14
  Filled 2013-04-26 (×5): qty 1

## 2013-04-26 MED ORDER — ALUM & MAG HYDROXIDE-SIMETH 200-200-20 MG/5ML PO SUSP
30.0000 mL | ORAL | Status: DC | PRN
  Administered 2013-04-26: 30 mL via ORAL

## 2013-04-26 MED ORDER — CLONIDINE HCL 0.1 MG PO TABS: 0.1000 mg | ORAL_TABLET | Freq: Four times a day (QID) | ORAL | Status: DC

## 2013-04-26 MED ORDER — CHLORDIAZEPOXIDE HCL 25 MG PO CAPS
25.0000 mg | ORAL_CAPSULE | Freq: Once | ORAL | Status: AC
  Administered 2013-04-26: 25 mg via ORAL
  Filled 2013-04-26: qty 1

## 2013-04-26 MED ORDER — CHLORDIAZEPOXIDE HCL 25 MG PO CAPS
25.0000 mg | ORAL_CAPSULE | Freq: Four times a day (QID) | ORAL | Status: AC
  Administered 2013-04-26 (×3): 25 mg via ORAL
  Filled 2013-04-26 (×4): qty 1

## 2013-04-26 NOTE — Progress Notes (Signed)
D: Patient in bed resting on approach.  Patient states sge is here strictly for detox.  Patient did not go into detail about her  OD on heroine or the use of other drugs.  Patient states, "I just want to be done with that stuff."   Patient states the bed in uncomfortable and states it is making her back sore. Patient denies SI/HI and denies AVH. A: Staff to monitor Q 15 mins for safety.  Encouragement and support offered.  Scheduled medications administered per orders.  Tylenol administered prn for pain. R: Patient remains safe on the unit.  Patient attended group tonight.  Patient visible on the unit and interacting with peers.  Patient cooperative and taking administered medications.

## 2013-04-26 NOTE — Tx Team (Signed)
Initial Interdisciplinary Treatment Plan  PATIENT STRENGTHS: (choose at least two) Ability for insight Average or above average intelligence Communication skills Physical Health  PATIENT STRESSORS: Financial difficulties Loss of housing Substance abuse   PROBLEM LIST: Problem List/Patient Goals Date to be addressed Date deferred Reason deferred Estimated date of resolution  Polysubstance dependence 04/26/13     SI (no plan)    "                                                DISCHARGE CRITERIA:  Adequate post-discharge living arrangements Improved stabilization in mood, thinking, and/or behavior Reduction of life-threatening or endangering symptoms to within safe limits Verbal commitment to aftercare and medication compliance Withdrawal symptoms are absent or subacute and managed without 24-hour nursing intervention  PRELIMINARY DISCHARGE PLAN: Attend aftercare/continuing care group Attend 12-step recovery group Placement in alternative living arrangements  PATIENT/FAMIILY INVOLVEMENT: This treatment plan has been presented to and reviewed with the patient, Taylor Wiggins, and/or family member.  The patient and family have been given the opportunity to ask questions and make suggestions.  Merian Capron [facility] 04/26/2013, 3:13 AM

## 2013-04-26 NOTE — ED Notes (Signed)
Patient agitated, with c/o noise from neighboring patients. RN explained that it may get noisy in an ER setting. Pt removed her Nicotine patch stating "I don't want it". Pt moved to another room due to noise. Pt given sleep aid as ordered.

## 2013-04-26 NOTE — Progress Notes (Signed)
Pt vol admittted from [facility] for polysub and SI with no plan. Pt reports attempted heroin OD last week via injection. Pt drinking up to 2 large bottles of wine every other day as well as using grandmother's valium 2-5mg /daily. Also using percocet 2x/week. Pt's stressors include lack of family support (both parents are substance abusers), unemployment, financial strain and pt is losing housing next week due to roommate's move out of state. On admit pt's CIWA and COWS are both a 7. Oriented to unit and given meal and fluids. Medicated with librium, zofran and bendaryl. Pt is calm, cooperative but anxious. Complains of all over body aches but refuses tylenol. No SI/HI/AVH. No medical hx other than reflux and seasonal allergies. NKDA. Lawrence Marseilles

## 2013-04-26 NOTE — Progress Notes (Signed)
Patient has been accepted at [facility] by Dr. Baron Sane to the services of Dr. Dub Mikes, room 303.1

## 2013-04-26 NOTE — Progress Notes (Signed)
D:  Patient's self inventory sheet, patient has poor sleep, poor appetite, low energy level, improving attention span.  Rated depression and hopelessness #6, anxiety #8.  Has experienced withdrawals consisting of chilling, aches, cravings, agitation.  Denied physical problems in past 24 hours.  Zero pain goal.  Worst pain #9.  Denied HI.  Denied A/V hallucinations.  No discharge plans.  Needs assistance buying meds after discharge. A:  Medications administered per MD orders.  Support and encouragement given throughout day.   R:  Following treatment plan.  Denied SSI & HI.  Denied A/V hallucinations.  Contracts for safety.

## 2013-04-26 NOTE — ED Notes (Signed)
Pt moved to room 36.  Pt's belongings moved to locker 36.
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
## 2013-04-26 NOTE — [facility] Suicide Risk Assessment (Signed)
[facility] INPATIENT:  Family/Significant Other Suicide Prevention Education  Suicide Prevention Education:  Patient Refusal for Family/Significant Other Suicide Prevention Education: The patient Taylor Wiggins has refused to provide written consent for family/significant other to be provided Family/Significant Other Suicide Prevention Education during admission and/or prior to discharge.  Physician notified.  Daryel Gerald B 04/26/2013, 5:19 PM

## 2013-04-26 NOTE — Tx Team (Signed)
  Interdisciplinary Treatment Plan Update   Date Reviewed:  04/26/2013  Time Reviewed:  8:30 AM  Progress in Treatment:   Attending groups: No Participating in groups:No Taking medication as prescribed: Yes  Tolerating medication: Yes Family/Significant other contact made: Yes  Patient understands diagnosis: Yes As evidenced by asking for help with opiate detox Discussing patient identified problems/goals with staff: Yes Medical problems stabilized or resolved: Yes Denies suicidal/homicidal ideation: Yes  In tx team Patient has not harmed self or others: Yes  For review of initial/current patient goals, please see plan of care.  Estimated Length of Stay:  2-3 days  Reason for Continuation of Hospitalization: Depression Medication stabilization Withdrawal symptoms  New Problems/Goals identified:  N/A  Discharge Plan or Barriers:   return home, follow up outpt  Additional Comments:Taylor Wiggins is an 32 y.o. female. Pt presents voluntarily to [facility] with request for detox from alcohol and she endorses SI with no plan. Pt has one prior suicide attempt last Thurs when she attempted to OD by injecting herself w/ heroin. Pt says she has only used heroin 8 times in her life and last Thurs was first use in a few mos. She is unsure of amount injected. Pt was at [facility] 30 days ago but was transferred from [facility] to [facility] as [facility] was having funding issues. She notes she stayed clean and sober for 2 weeks but then relapsed. Pt requests to go to [facility]. Pt endorses severe anxiety with frequent panic attacks with last panic attack today. She says she has no support system and is losing her housing next week when her roommate moves out of state. Pt is a Writer. Pt went to [facility] in 2007 and [facility] inpatient in 2012. Pt's BAL was 257 upon arrival at 2200 and UDS was positive for benzos. Pt states she isn't prescribed Valium but takes approx. 2 mg to 5 mg daily to control panic attacks.  She endorses depressed mood with isolating, loss of interest, anger/irritability, fatigue and early insomnia. Pt's parents are alcoholics and aren't supportive. Pt drinks alcohol every other day, approx. 2 lg bottles of wine. She isn't sure how much she drank tonight. Current withdrawal symptom is increased anxiety. Pt uses percocets 1-2 times per week, unsure of amount used. Pt has no hx of seizures and she has no court dates coming up. She says she really wants help and wants to get clean and sober and deal with issues in her life without using. Affect is depressed and anxious. She denies HI and denies [facility]. No delusions noted. She reports decreased concentration.   Attendees:  Signature:Himabindu Daleen Bo, MD 04/26/2013 8:30 AM   Signature: Richelle Ito, LCSW 04/26/2013 8:30 AM  Signature: Fransisca Kaufmann, NP  04/26/2013 8:30 AM  Signature: Neill Loft, RN  04/26/2013 8:30 AM  Signature:  04/26/2013 8:30 AM  Signature:  04/26/2013 8:30 AM  Signature:   04/26/2013 8:30 AM  Signature:    Signature:    Signature:    Signature:    Signature:    Signature:      Scribe for Treatment Team:   Richelle Ito, LCSW  04/26/2013 8:30 AM

## 2013-04-26 NOTE — BH Assessment (Addendum)
Assessment Note   Taylor Wiggins is an 32 y.o. female. Pt presents voluntarily to [facility] with request for detox from alcohol and she endorses SI with no plan. Pt has one prior suicide attempt last Thurs when she attempted to OD by injecting herself w/ heroin. Pt says she has only used heroin 8 times in her life and last Thurs was first use in a few mos. She is unsure of amount injected. Pt was at [facility] 30 days ago but was transferred from [facility] to [facility] as [facility] was having funding issues. She notes she stayed clean and sober for 2 weeks but then relapsed. Pt requests to go to [facility]. Pt endorses severe anxiety with frequent panic attacks with last panic attack today. She says she has no support system and is losing her housing next week when her roommate moves out of state. Pt is a Writer. Pt went to [facility] in 2007 and [facility] inpatient in 2012. Pt's BAL was 257 upon arrival at 2200 and UDS was positive for benzos. Pt states she isn't prescribed Valium but takes approx. 2 mg to 5 mg daily to control panic attacks. She endorses depressed mood with isolating, loss of interest, anger/irritability, fatigue and early insomnia. Pt's parents are alcoholics and aren't supportive.  Pt drinks alcohol every other day, approx. 2 lg bottles of wine. She isn't sure how much she drank tonight. Current withdrawal symptom is increased anxiety. Pt uses percocets 1-2 times per week, unsure of amount used. Pt has no hx of seizures and she has no court dates coming up. She says she really wants help and wants to get clean and sober and deal with issues in her life without using. Affect is depressed and anxious. She denies HI and denies [facility]. No delusions noted. She reports decreased concentration.  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  No pertinent past medical history. Axis IV: housing problems, other psychosocial or environmental problems, problems related to social environment and problems with primary support group Axis V: 31-40 impairment in reality testing  Past Medical History: History reviewed. No pertinent past medical history.  History reviewed. No pertinent past surgical history.  Family History: History reviewed. No pertinent family history.  Social History:  reports that she has been smoking Cigarettes.  She has been smoking about 0.50 packs per day. She does not have any smokeless tobacco history on file. She reports that  drinks alcohol. She reports that she uses illicit drugs (Heroin and Benzodiazepines).  Additional Social History:  Alcohol / Drug Use Pain Medications: pt uses percocets 2 x weekly Prescriptions: see PTA meds list Over the Counter: see PTA meds list History of alcohol / drug use?: Yes Longest period of sobriety (when/how long): 1.5 years clean and sober Withdrawal Symptoms: Agitation (anxiety) Substance #1 Name of Substance 1: etoh 1 - Age of First Use: 19 1 - Amount (size/oz): 2 lg wine bottles 1 - Frequency: every other day 1 - Duration: 12 years 1 - Last Use / Amount: 04/25/13 - lots of wine Substance #2 Name of Substance 2: valium 2 - Age of First Use: 21 2 - Amount (size/oz): 2mg  to 5 mg 2 - Frequency: daily 2 - Duration: years 2 - Last Use /  Amount: 04/25/13 Substance #3 Name of Substance 3: Percocet 3 - Age of First Use: 19 3 - Amount (size/oz): varies 3 - Frequency: twice weekly 3 - Duration: years 3 - Last Use / Amount: unsure of last use Substance #4 Name of Substance 4: heroin 4 - Age of First Use: 31 4 - Amount (size/oz): unsure of amoutn 4 - Frequency: has used 8 times total 4 - Duration: for past several mos 4 - Last Use / Amount: 04/22/13 - unsure of amount but tried to OD to commit suicide  CIWA: CIWA-Ar BP: 112/86 mmHg Pulse Rate: 108 Nausea and  Vomiting: no nausea and no vomiting Tactile Disturbances: none Tremor: no tremor Auditory Disturbances: not present Paroxysmal Sweats: no sweat visible Visual Disturbances: not present Anxiety: no anxiety, at ease Headache, Fullness in Head: none present Agitation: normal activity Orientation and Clouding of Sensorium: oriented and can do serial additions CIWA-Ar Total: 0 COWS:    Allergies: No Known Allergies  Home Medications:  (Not in a hospital admission)  OB/GYN Status:  Patient's last menstrual period was 04/25/2013.  General Assessment Data Location of Assessment: [facility] ED Living Arrangements: Non-relatives/Friends Can pt return to current living arrangement?: No Admission Status: Voluntary Is patient capable of signing voluntary admission?: Yes Transfer from: Acute Hospital Referral Source: Self/Family/Friend  Education Status Is patient currently in school?: No Current Grade: na Highest grade of school patient has completed: 95 Name of school: [employer]  Risk to self Suicidal Ideation: Yes-Currently Present Suicidal Intent: No Is patient at risk for suicide?: Yes Suicidal Plan?: No (pt denies intent or plan) Access to Means: No What has been your use of drugs/alcohol within the last 12 months?: etoh and opioid abuse Previous Attempts/Gestures: Yes How many times?: 1 (tried to OD on heroin 04/22/13) Other Self Harm Risks: none Triggers for Past Attempts: Other (Comment) (depression, etoh relapse) Intentional Self Injurious Behavior: None Family Suicide History: No (mom and dad alcohlics) Recent stressful life event(s): Conflict (Comment);Turmoil (Comment) (breakup with significant other, relapse, losing housing) Persecutory voices/beliefs?: No Depression: Yes Depression Symptoms: Loss of interest in usual pleasures;Isolating;Feeling angry/irritable;Insomnia;Fatigue Substance abuse history and/or treatment for substance abuse?: Yes Suicide prevention  information given to non-admitted patients: Not applicable  Risk to Others Homicidal Ideation: No Thoughts of Harm to Others: No Current Homicidal Intent: No Current Homicidal Plan: No Access to Homicidal Means: No Identified Victim: none History of harm to others?: No Assessment of Violence: None Noted Violent Behavior Description: pt calm and cooperative Does patient have access to weapons?: No Criminal Charges Pending?: No Does patient have a court date: No  Psychosis Hallucinations: None noted Delusions: None noted  Mental Status Report Appear/Hygiene: Other (Comment) (unremarkable) Eye Contact: Good Motor Activity: Freedom of movement Speech: Logical/coherent Level of Consciousness: Alert Mood: Depressed;Anxious;Sad;Anhedonia Affect: Appropriate to circumstance;Anxious;Depressed Anxiety Level: Panic Attacks Panic attack frequency: almost daily Most recent panic attack: 04/25/13 Thought Processes: Relevant;Coherent Judgement: Unimpaired Orientation: Person;Place;Time;Situation Obsessive Compulsive Thoughts/Behaviors: None  Cognitive Functioning Concentration: Decreased Memory: Remote Intact;Recent Intact IQ: Average Insight: Good Impulse Control: Poor Appetite: Fair Weight Loss: 0 Weight Gain: 0 Sleep: Decreased Total Hours of Sleep: 5 Vegetative Symptoms: None  ADLScreening [facility] Assessment Services) Patient's cognitive ability adequate to safely complete daily activities?: Yes Patient able to express need for assistance with ADLs?: Yes Independently performs ADLs?: Yes (appropriate for developmental age)  Abuse/Neglect [facility]) Physical Abuse: Denies Verbal Abuse: Denies Sexual Abuse: Denies  Prior Inpatient Therapy Prior Inpatient Therapy: Yes Prior Therapy Dates: 2014 /  2012 / 2007 Prior Therapy Facilty/Provider(s): [facility] C[facility] Reason for Treatment: depression, anxiety, substance abuse  Prior Outpatient  Therapy Prior Outpatient Therapy: No Prior Therapy Dates: na Prior Therapy Facilty/Provider(s): na Reason for Treatment: na  ADL Screening (condition at time of admission) Patient's cognitive ability adequate to safely complete daily activities?: Yes Patient able to express need for assistance with ADLs?: Yes Independently performs ADLs?: Yes (appropriate for developmental age) Weakness of Legs: None Weakness of Arms/Hands: None  Home Assistive Devices/Equipment Home Assistive Devices/Equipment: None    Abuse/Neglect Assessment (Assessment to be complete while patient is alone) Physical Abuse: Denies Verbal Abuse: Denies Sexual Abuse: Denies Exploitation of patient/patient's resources: Denies Self-Neglect: Denies Values / Beliefs Cultural Requests During Hospitalization: None Spiritual Requests During Hospitalization: None   Advance Directives (For Healthcare) Advance Directive: Patient does not have advance directive;Patient would not like information    Additional Information 1:1 In Past 12 Months?: No CIRT Risk: No Elopement Risk: No Does patient have medical clearance?: Yes     Disposition:  Disposition Initial Assessment Completed for this Encounter: Yes Disposition of Patient: Inpatient treatment program Type of inpatient treatment program: Adult  On Site Evaluation by:   Reviewed with Physician:     Donnamarie Rossetti P 04/26/2013 12:22 AM

## 2013-04-26 NOTE — BHH Counselor (Signed)
Support paperwork signed and faxed to [employer]. [employer] placed in pt's chart. EDP Otter & Marcelino Duster RN notified.   Evette Cristal, [place] Assessment Counselor

## 2013-04-26 NOTE — BHH Suicide Risk Assessment (Signed)
Suicide Risk Assessment  Admission Assessment     Nursing information obtained from:  Patient Demographic factors:  Caucasian;Unemployed Current Mental Status:  Suicidal ideation indicated by patient Loss Factors:  Loss of significant relationship;Financial problems / change in socioeconomic status Historical Factors:  Family history of mental illness or substance abuse;Prior suicide attempts Risk Reduction Factors:  Positive social support  CLINICAL FACTORS:   Alcohol/Substance Abuse/Dependencies  COGNITIVE FEATURES THAT CONTRIBUTE TO RISK:  Cognitively intact  SUICIDE RISK:   Mild:  Suicidal ideation of limited frequency, intensity, duration, and specificity.  There are no identifiable plans, no associated intent, mild dysphoria and related symptoms, good self-control (both objective and subjective assessment), few other risk factors, and identifiable protective factors, including available and accessible social support.  PLAN OF CARE: Start Librium protocol for alcohol detox. Start Zoloft at 25mg  po qd for mood symptoms. Provide supportive counselling and education.  I certify that inpatient services furnished can reasonably be expected to improve the patient's condition.  Larrisa Cravey 04/26/2013, 1:37 PM

## 2013-04-26 NOTE — BHH Counselor (Signed)
Adult Comprehensive Assessment  Patient ID: Taylor Wiggins, female   DOB: 10/20/1981, 32 y.o.   MRN: [id number]  Information Source: Information source: Patient  Current Stressors:  Educational / Learning stressors: N/A Employment / Job issues: Yes  Currently unemployed Family Relationships: Yes  Parents are also TEFL teacher / Lack of resources (include bankruptcy): Yes  No current income Housing / Lack of housing: Not acute  Says she can stay at current place until the end of May Physical health (include injuries & life threatening diseases): N/A Social relationships: Yes  Limited Substance abuse: Yes  Alcohol and opitates Bereavement / Loss: Yes  Recent break up of relationship  Living/Environment/Situation:  Living Arrangements: Non-relatives/Friends Living conditions (as described by patient or guardian): Temporary  Must fine another place to live by the end of May What is atmosphere in current home: Temporary  Family History:  Marital status: Single Does patient have children?: No  Childhood History:  By whom was/is the patient raised?: Mother Additional childhood history information: parents had joint custody Description of patient's relationship with caregiver when they were a child: good Patient's description of current relationship with people who raised him/her: good Does patient have siblings?: No Did patient suffer any verbal/emotional/physical/sexual abuse as a child?: No Did patient suffer from severe childhood neglect?: No Has patient ever been sexually abused/assaulted/raped as an adolescent or adult?: No Was the patient ever a victim of a crime or a disaster?: No Witnessed domestic violence?: No Has patient been effected by domestic violence as an adult?: No  Education:  Highest grade of school patient has completed: Engineer, mining 2007 Currently a student?: No Learning disability?: No  Employment/Work Situation:   Employment situation:  Unemployed What is the longest time patient has a held a job?: 2 years 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, administration Has patient ever been in the [employer]?: No Has patient ever served in Buyer, retail?: No  Financial Resources:   Financial resources: No income Does patient have a Lawyer or guardian?: No  Alcohol/Substance Abuse:   If attempted suicide, did drugs/alcohol play a role in this?: Yes Alcohol/Substance Abuse Treatment Hx: Past Tx, Inpatient If yes, describe treatment: [facility], then [facility]  Social Support System:   Patient's Community Support System: Fair Museum/gallery exhibitions officer System: folks in [place], family Type of faith/religion: [employer] How does patient's faith help to cope with current illness?: gives me hope  Leisure/Recreation:   Leisure and Hobbies: walk around [employer], watch TV  Strengths/Needs:   What things does the patient do well?: listening, being a good friend In what areas does patient struggle / problems for patient: sobriety  Discharge Plan:   Does patient have access to transportation?: Yes Will patient be returning to same living situation after discharge?: Yes Currently receiving community mental health services: Yes (From Whom) ([facility]) Does patient have financial barriers related to discharge medications?: Yes Patient description of barriers related to discharge medications: No income  Summary/Recommendations:   Summary and Recommendations (to be completed by the evaluator): Taylor Wiggins is a 32 year old Caucasian female who is here for depression secondary to the break up of a relationship, and detox.  She plans to return to [facility] IOP at d/c.  She can benefit from crises stabilization, med managment, detox, therapeutic milieu and referral for services.  Daryel Gerald B. 04/26/2013

## 2013-04-26 NOTE — ED Notes (Signed)
Pt has 1 [employer] bag and 1 black purse locked in locker 41.  Pt wanted to confirm that she had cash and a cell phone in her locker.  Belongings were checked by myself and [employer].  We both confirm that there is $14 cash and a Samsung cell phone in her belongings.  Info reported to pt.

## 2013-04-26 NOTE — ED Notes (Signed)
[name]; Dr. Norlene Campbell notified of complaints. Taylor Wiggins

## 2013-04-26 NOTE — Progress Notes (Signed)
Adult Psychoeducational Group Note  Date:  04/26/2013 Time:  9:30 PM  Group Topic/Focus:  Rediscovering Joy:   The focus of this group is to explore various ways to relieve stress in a positive manner.  Participation Level:  Active  Participation Quality:  Appropriate and Attentive  Affect:  Appropriate  Cognitive:  Appropriate  Insight: Appropriate  Engagement in Group:  Supportive  Modes of Intervention:  Problem-solving  Additional Comments: Taylor Wiggins was not a talkative person but was able to have a positive look on things.   Annell Greening Cassel 04/26/2013, 9:30 PM

## 2013-04-27 NOTE — Progress Notes (Signed)
Grief and Loss Group  Group members discussed significant losses in their life and their feelings associated with their losses. Members shared coping strategies that helped them through their grief and loss process.   Pt shared the multiple losses of her job, her romantic relationship, and her material possessions. Pt discussed the loss of her sobriety and the motivation to get it back. Pt shared both negative and positive coping strategies. Pt was receptive to feedback from other group members.   Sherol Dade Counselor Intern Haroldine Laws

## 2013-04-27 NOTE — Progress Notes (Addendum)
D: Patient in the hallway on approach.  Patient states she is feeling much better.  Patient states she believes she is ready to go home tomorrow.  Patient states she is going to work on relapse prevention and taking medications.  No scheduled medications administered tonight.  Patient states she is not having withdrawal symptoms.  Patient denies SI/HI and denies AVH. A: Staff to monitor Q 15 mins for safety.  Encouragement and support offered.  No scheduled medications  administered tonight.   R: Patient remains safe on the unit. Patient attended 500 hall group tonight because there was not a AA group this evening.  Patient taking administered medications.

## 2013-04-27 NOTE — Progress Notes (Signed)
[facility] MD Progress Note  04/27/2013 11:07 AM Taylor Wiggins  MRN:  [id number] Subjective:  Patient more awake today, denies withdrawal symptoms today. Tolerating Zoloft well without side effects. States she is looking forward to discharge and will resume living at her previous residence with room mate.  Diagnosis:  Axis I: Alcohol Abuse and Substance Induced Mood Disorder Axis II: Deferred Axis III: History reviewed. No pertinent past medical history. Axis IV: economic problems, occupational problems and other psychosocial or environmental problems Axis V: 41-50 serious symptoms  ADL's:  Intact  Sleep: Fair  Appetite:  Fair    Psychiatric Specialty Exam: Review of Systems  Constitutional: Negative.   HENT: Negative.   Eyes: Negative.   Respiratory: Negative.   Cardiovascular: Negative.   Gastrointestinal: Negative.   Genitourinary: Negative.   Musculoskeletal: Negative.   Skin: Negative.   Neurological: Negative.   Endo/Heme/Allergies: Negative.   Psychiatric/Behavioral: Positive for depression and substance abuse. The patient is nervous/anxious.     Blood pressure 114/80, pulse 64, temperature 97.6 F (36.4 C), temperature source Oral, resp. rate 18, height 5\' 7"  (1.702 m), weight 75.751 kg (167 lb), last menstrual period 04/25/2013.Body mass index is 26.15 kg/(m^2).  General Appearance: Casual  Eye Contact::  Fair  Speech:  Normal Rate  Volume:  Decreased  Mood:  Anxious and Dysphoric  Affect:  Constricted and Depressed  Thought Process:  Circumstantial  Orientation:  Full (Time, Place, and Person)  Thought Content:  Rumination  Suicidal Thoughts:  No  Homicidal Thoughts:  No  Memory:  Immediate;   Fair Recent;   Fair Remote;   Fair  Judgement:  Fair  Insight:  Shallow  Psychomotor Activity:  Decreased  Concentration:  Fair  Recall:  Fair  Akathisia:  No  Handed:  Right  AIMS (if indicated):     Assets:  Communication Skills Desire for Improvement Housing   Sleep:  Number of Hours: 6.75   Current Medications: Current Facility-Administered Medications  Medication Dose Route Frequency Provider Last Rate Last Dose  . acetaminophen (TYLENOL) tablet 650 mg  650 mg Oral Q6H PRN Larena Sox, MD   650 mg at 04/26/13 2132  . alum & mag hydroxide-simeth (MAALOX/MYLANTA) 200-200-20 MG/5ML suspension 30 mL  30 mL Oral Q4H PRN Larena Sox, MD   30 mL at 04/26/13 1649  . chlordiazePOXIDE (LIBRIUM) capsule 25 mg  25 mg Oral Q6H PRN Larena Sox, MD      . chlordiazePOXIDE (LIBRIUM) capsule 25 mg  25 mg Oral TID Larena Sox, MD   25 mg at 04/27/13 0755   Followed by  . [START ON 04/28/2013] chlordiazePOXIDE (LIBRIUM) capsule 25 mg  25 mg Oral BH-qamhs Larena Sox, MD       Followed by  . [START ON 04/29/2013] chlordiazePOXIDE (LIBRIUM) capsule 25 mg  25 mg Oral Daily Larena Sox, MD      . diphenhydrAMINE (BENADRYL) capsule 50 mg  50 mg Oral QHS PRN Larena Sox, MD   50 mg at 04/26/13 0242  . hydrOXYzine (ATARAX/VISTARIL) tablet 25 mg  25 mg Oral Q6H PRN Larena Sox, MD      . loperamide (IMODIUM) capsule 2-4 mg  2-4 mg Oral PRN Larena Sox, MD      . magnesium hydroxide (MILK OF MAGNESIA) suspension 30 mL  30 mL Oral Daily PRN Larena Sox, MD   30 mL at 04/26/13 1650  . multivitamin with minerals tablet 1 tablet  1 tablet Oral Daily Larena Sox, MD   1 tablet at 04/27/13 0755  . ondansetron (ZOFRAN-ODT) disintegrating tablet 4 mg  4 mg Oral Q6H PRN Larena Sox, MD   4 mg at 04/26/13 0242  . sertraline (ZOLOFT) tablet 25 mg  25 mg Oral Daily Binta Statzer, MD   25 mg at 04/27/13 0756  . thiamine (B-1) injection 100 mg  100 mg Intramuscular Once Larena Sox, MD      . thiamine (VITAMIN B-1) tablet 100 mg  100 mg Oral Daily Larena Sox, MD   100 mg at 04/27/13 0756    Lab Results:  Results for orders placed during the hospital encounter of 04/25/13 (from the past 48 hour(s))  URINE RAPID DRUG  SCREEN (HOSP PERFORMED)     Status: None   Collection Time    04/25/13  9:43 PM      Result Value Range   Opiates NONE DETECTED  NONE DETECTED   Cocaine NONE DETECTED  NONE DETECTED   Benzodiazepines NONE DETECTED  NONE DETECTED   Amphetamines NONE DETECTED  NONE DETECTED   Tetrahydrocannabinol NONE DETECTED  NONE DETECTED   Barbiturates NONE DETECTED  NONE DETECTED   Comment:            DRUG SCREEN FOR MEDICAL PURPOSES     ONLY.  IF CONFIRMATION IS NEEDED     FOR ANY PURPOSE, NOTIFY LAB     WITHIN 5 DAYS.                LOWEST DETECTABLE LIMITS     FOR URINE DRUG SCREEN     Drug Class       Cutoff (ng/mL)     Amphetamine      1000     Barbiturate      200     Benzodiazepine   200     Tricyclics       300     Opiates          300     Cocaine          300     THC              50  CBC     Status: Abnormal   Collection Time    04/25/13  9:50 PM      Result Value Range   WBC 6.7  4.0 - 10.5 K/uL   RBC 4.29  3.87 - 5.11 MIL/uL   Hemoglobin 13.6  12.0 - 15.0 g/dL   HCT 19.1  47.8 - 29.5 %   MCV 92.8  78.0 - 100.0 fL   MCH 31.7  26.0 - 34.0 pg   MCHC 34.2  30.0 - 36.0 g/dL   RDW 62.1  30.8 - 65.7 %   Platelets 432 (*) 150 - 400 K/uL  COMPREHENSIVE METABOLIC PANEL     Status: Abnormal   Collection Time    04/25/13  9:50 PM      Result Value Range   Sodium 138  135 - 145 mEq/L   Potassium 3.7  3.5 - 5.1 mEq/L   Chloride 103  96 - 112 mEq/L   CO2 23  19 - 32 mEq/L   Glucose, Bld 98  70 - 99 mg/dL   BUN 8  6 - 23 mg/dL   Creatinine, Ser 8.46  0.50 - 1.10 mg/dL   Calcium 9.1  8.4 - 96.2 mg/dL   Total Protein 7.7  6.0 - 8.3 g/dL   Albumin 4.2  3.5 - 5.2 g/dL   AST 18  0 - 37 U/L   ALT 17  0 - 35 U/L   Alkaline Phosphatase 56  39 - 117 U/L   Total Bilirubin 0.1 (*) 0.3 - 1.2 mg/dL   GFR calc non Af Amer >90  >90 mL/min   GFR calc Af Amer >90  >90 mL/min   Comment:            The eGFR has been calculated     using the CKD EPI equation.     This calculation has not been      validated in all clinical     situations.     eGFR's persistently     <90 mL/min signify     possible Chronic Kidney Disease.  ETHANOL     Status: Abnormal   Collection Time    04/25/13  9:50 PM      Result Value Range   Alcohol, Ethyl (B) 257 (*) 0 - 11 mg/dL   Comment:            LOWEST DETECTABLE LIMIT FOR     SERUM ALCOHOL IS 11 mg/dL     FOR MEDICAL PURPOSES ONLY  POCT PREGNANCY, URINE     Status: None   Collection Time    04/25/13  9:51 PM      Result Value Range   Preg Test, Ur NEGATIVE  NEGATIVE   Comment:            THE SENSITIVITY OF THIS     METHODOLOGY IS >24 mIU/mL    Physical Findings: AIMS: Facial and Oral Movements Muscles of Facial Expression: None, normal Lips and Perioral Area: None, normal Jaw: None, normal Tongue: None, normal,Extremity Movements Upper (arms, wrists, hands, fingers): None, normal Lower (legs, knees, ankles, toes): None, normal, Trunk Movements Neck, shoulders, hips: None, normal, Overall Severity Severity of abnormal movements (highest score from questions above): None, normal Incapacitation due to abnormal movements: None, normal Patient's awareness of abnormal movements (rate only patient's report): No Awareness, Dental Status Current problems with teeth and/or dentures?: No Does patient usually wear dentures?: No  CIWA:  CIWA-Ar Total: 2 COWS:  COWS Total Score: 1  Treatment Plan Summary: Daily contact with patient to assess and evaluate symptoms and progress in treatment Medication management  Plan: Continue current plan. Provide supportive counselling and education. Plan for discharge tomorrow if stabilized.  Medical Decision Making Problem Points:  Established problem, stable/improving (1), Review of last therapy session (1) and Review of psycho-social stressors (1) Data Points:  Review of medication regiment & side effects (2)  I certify that inpatient services furnished can reasonably be expected to improve the  patient's condition.   Yoali Conry 04/27/2013, 11:07 AM

## 2013-04-27 NOTE — Progress Notes (Signed)
Adult Psychoeducational Group Note  Date:  04/27/2013 Time:  9:05 PM  Group Topic/Focus:  Wrap-Up Group:   The focus of this group is to help patients review their daily goal of treatment and discuss progress on daily workbooks.  Participation Level:  Active  Participation Quality:  Appropriate and Sharing  Affect:  Appropriate  Cognitive:  Alert and Appropriate  Insight: Appropriate  Engagement in Group:  Developing/Improving  Modes of Intervention:  Discussion and Problem-solving  Additional Comments:  Patient stated that she has decreased depression with no withdrawal symptoms. Patient shared that she will be discharged tomorrow and plans to go to [facility] for aftercare treatment. Patient plans to incorporate the use of her support system with includes her sponsor and her friend.   Lyndee Hensen 04/27/2013, 9:05 PM

## 2013-04-27 NOTE — Progress Notes (Signed)
D:  Patient's self inventory sheet, patient sleeps well, has good appetite, normal energy level, improving attention span.  Rated depression #1, denied hopelessness.  Denied withdrawals.  Denied SI.  Zero pain goal.  After discharge, plans to following through with [facility] and move, AA & NA.  Denied HI.  Denied A/V hallucinations.  Denied pain.  "I feel great, normal."  Does have discharge plans.  No problems taking meds after discharge. A:  Medications administered per MD orders.  Support and encouragement given throughout day.  R:  Following treatment plan.  Denied SI and HI, contracts for safety.  Denied A/V hallucinations.   Will continue to monitor patient for safety with 15 minute checks.

## 2013-04-27 NOTE — [facility] Group Notes (Signed)
[facility] LCSW Group Therapy      Feelings About Diagnosis 1:15 - 2:30 PM          04/27/2013 3:05 PM  Type of Therapy:  Group Therapy  Participation Level:  Active  Participation Quality:  Appropriate and Attentive  Affect:  Appropriate  Cognitive:  Alert and Appropriate  Insight:  Engaged  Engagement in Therapy:  Engaged  Modes of Intervention:  Discussion, Education, Exploration, Problem-Solving, Rapport Building, Support  Summary of Progress/Problems:  Patient shared she is an alcoholic and has steps she must take to maintain sobriety.  Patient shared she has to go to AA, work with her sponsor and stay away from places where alcohol is served.  Wynn Banker 04/27/2013, 3:05 PM

## 2013-04-27 NOTE — [facility] Group Notes (Signed)
[facility] LCSW Aftercare Discharge Planning Group Note   04/27/2013 12:20 PM  Participation Quality:  Appropriate  Mood/Affect:  Appropriate  Depression Rating:  0  Anxiety Rating:  3  Thoughts of Suicide:  No  Will you contract for safety?   NA  Current AVH:  Yes  Plan for Discharge/Comments:  Patient reports being "great" and not having any withdrawal symptoms.  She is hopeful to discharge home today.  She reports she and CSW have plans in place for d/c  Transportation Means:   Patient has transportation.  Supports:  Patient has a good support system.   Sire Poet, Joesph July

## 2013-04-27 NOTE — Progress Notes (Signed)
Adult Psychoeducational Group Note  Date:  04/27/2013 Time:  1:30 PM  Group Topic/Focus:  Therapeutic Activity- "Apples to Apples"  Participation Level:  Active  Participation Quality:  Appropriate, Attentive and Sharing  Affect:  Appropriate  Cognitive:  Alert and Appropriate  Insight: Appropriate  Engagement in Group:  Engaged  Modes of Intervention:  Activity  Additional Comments:  Pt appeared to have enjoyed the activity and agreed that she didn't feel that it was unfair when the judge chose the best card. Pt did state that the activity was refreshing and fun.  Sharyn Lull 04/27/2013, 1:30 PM

## 2013-04-28 DIAGNOSIS — F1020 Alcohol dependence, uncomplicated: Secondary | ICD-10-CM | POA: Diagnosis present

## 2013-04-28 MED ORDER — SERTRALINE HCL 25 MG PO TABS: 25.0000 mg | ORAL_TABLET | Freq: Every day | ORAL | Status: DC

## 2013-04-28 NOTE — [facility] Suicide Risk Assessment (Signed)
Suicide Risk Assessment  Discharge Assessment     Demographic Factors:  Female, caucasian, single  Mental Status Per Nursing Assessment::   On Admission:  Suicidal ideation indicated by patient  Current Mental Status by Physician: Alert and oriented to 4. Denies AH/VH/SI/HI.  Loss Factors: Financial problems/change in socioeconomic status  Historical Factors: Impulsivity  Risk Reduction Factors:   Positive social support and Positive coping skills or problem solving skills  Continued Clinical Symptoms:  Alcohol/Substance Abuse/Dependencies  Cognitive Features That Contribute To Risk:  Cognitively intact   Suicide Risk:  Minimal: No identifiable suicidal ideation.  Patients presenting with no risk factors but with morbid ruminations; may be classified as minimal risk based on the severity of the depressive symptoms  Discharge Diagnoses:   AXIS I:  Alcohol Abuse AXIS II:  Deferred AXIS III:  History reviewed. No pertinent past medical history. AXIS IV:  economic problems, occupational problems and other psychosocial or environmental problems AXIS V:  61-70 mild symptoms  Plan Of Care/Follow-up recommendations:  Activity:  as tolerated Diet:  regular Follow  up with outpatient appointments.  Is patient on multiple antipsychotic therapies at discharge:  No   Has Patient had three or more failed trials of antipsychotic monotherapy by history:  No  Recommended Plan for Multiple Antipsychotic Therapies: NA  Jayin Derousse 04/28/2013, 9:36 AM

## 2013-04-28 NOTE — Progress Notes (Signed)
Adult Psychoeducational Group Note  Date:  04/28/2013 Time:  12:07 PM  Group Topic/Focus:  Identifying Needs:   The focus of this group is to help patients identify their personal needs that have been historically problematic and identify healthy behaviors to address their needs.  Participation Level:  Active  Participation Quality:  Attentive  Affect:  Appropriate and Flat  Cognitive:  Alert and Oriented  Insight: Good  Engagement in Group:  Engaged  Modes of Intervention:  Discussion, Education and Support  Additional Comments:  PTs goal is to stay positive.  Emelly Wurtz T 04/28/2013, 12:07 PM

## 2013-04-28 NOTE — Progress Notes (Signed)
Adult Psychoeducational Group Note  Date:  04/28/2013 Time:  11:13 AM  Group Topic/Focus:  Therapeutic Activity- Our Perception "Apples to Apples"  Participation Level:  Active  Participation Quality:  Appropriate, Attentive and Sharing  Affect:  Appropriate  Cognitive:  Alert and Appropriate  Insight: Appropriate  Engagement in Group:  Engaged  Modes of Intervention:  Activity  Additional Comments:  Pt was appropriate and sharing while attending group. Pt participated in a therapeutic activity that highlighted every patient's perception. Pt appeared pleasant and excited about the interactions that took place during this activity.   Sharyn Lull 04/28/2013, 11:13 AM

## 2013-04-28 NOTE — Tx Team (Signed)
  Interdisciplinary Treatment Plan Update   Date Reviewed:  04/28/2013  Time Reviewed:  10:36 AM  Progress in Treatment:   Attending groups: Yes Participating in groups:Yes Taking medication as prescribed: Yes  Tolerating medication: Yes Family/Significant other contact made: Yes  Patient understands diagnosis: Yes As evidenced by asking for help with opiate detox Discussing patient identified problems/goals with staff: Yes Medical problems stabilized or resolved: Yes Denies suicidal/homicidal ideation: Yes  In tx team Patient has not harmed self or others: Yes  For review of initial/current patient goals, please see plan of care.  Estimated Length of Stay:  D/C today  Reason for Continuation of Hospitalization:   New Problems/Goals identified:  N/A  Discharge Plan or Barriers:   return home, follow up outpt  Additional Comments:   Attendees:  Signature:Himabindu Daleen Bo, MD 04/28/2013 10:36 AM   Signature: Richelle Ito, LCSW 04/28/2013 10:36 AM  Signature: Tera Helper, NP  04/28/2013 10:36 AM  Signature:  04/28/2013 10:36 AM  Signature:  04/28/2013 10:36 AM  Signature:  04/28/2013 10:36 AM  Signature:   04/28/2013 10:36 AM  Signature:    Signature:    Signature:    Signature:    Signature:    Signature:      Scribe for Treatment Team:   Richelle Ito, LCSW  04/28/2013 10:36 AM

## 2013-04-28 NOTE — Progress Notes (Signed)
Discharge Note  D: Patient's mood was appropriate to the circumstance. She reported on the self inventory sheet that her energy level is normal. Patient rated depression "1" and feelings of hopelessness "0". Patient reported that changes she plans to make to better care for self are to attend AA, NA, therapy and [facility].  A: Support and encouragement provided to patient. Administered scheduled medications per ordering MD. Discharge instructions/prescription/medication samples given to patient. Returned belongings to patient.  R: Patient receptive. Denies SI/HI/AVH. Patient d/c without incident. Patient verbalized understanding of discharge instructions and prescriptions.

## 2013-04-28 NOTE — Progress Notes (Signed)
[facility] Adult Case Management Discharge Plan :  Will you be returning to the same living situation after discharge: Yes,  home At discharge, do you have transportation home?:Yes,  roomate Do you have the ability to pay for your medications:Yes,  mental health  Release of information consent forms completed and in the chart;  Patient's signature needed at discharge.  Patient to Follow up at: Follow-up Information   Follow up with [facility]. (Go to the walk-in clinic M-F between 8 and 9AM for your hospital follow up appointment.  This is where you will see a psychiatrist for your medications.  they also have a pharmacy there.)    Contact information:   [address] [place]  [phone]      Follow up with [facility] On 04/30/2013. (Resume your IOP groups on Friday)    Contact information:    [address]  [place]  [phone]      Patient denies SI/HI:   Yes,  yes    Safety Planning and Suicide Prevention discussed:  Yes,  yes  Ida Rogue 04/28/2013, 10:36 AM

## 2013-04-28 NOTE — [facility] Group Notes (Signed)
[facility] LCSW Aftercare Discharge Planning Group Note   04/28/2013 1:12 PM  Participation Quality:  Appropriate  Mood/Affect:  Appropriate  Depression Rating:  0  Anxiety Rating:  0  Thoughts of Suicide:  No  Will you contract for safety?   NA  Current AVH:  No  Plan for Discharge/Comments:  Patient reports doing great and looks forward to discharging home today. She shared plans to see her MD, check on a job and follow up with an AA meeting this evening.  Transportation Means:  Patient has transportation.   Supports:  Patient has a good support system.    Kayonna Lawniczak, Joesph July

## 2013-04-28 NOTE — Discharge Summary (Signed)
Physician Discharge Summary Note  Patient:  Taylor Wiggins is an 32 y.o., female MRN:  [id number] DOB:  12/14/1981 Patient phone:  [phone] (home)  Patient address:   [address] [place] [address],   Date of Admission:  04/26/2013 Date of Discharge: 04/28/2013  Reason for Admission:  Alcohol dependency/withdrawal  Discharge Diagnoses: Active Problems:   Alcohol dependence  Review of Systems  Constitutional: Negative.   HENT: Negative.   Eyes: Negative.   Respiratory: Negative.   Cardiovascular: Negative.   Gastrointestinal: Negative.   Genitourinary: Negative.   Musculoskeletal: Negative.   Skin: Negative.   Neurological: Negative.   Endo/Heme/Allergies: Negative.   Psychiatric/Behavioral: Positive for substance abuse.   Axis Diagnosis:   AXIS I:  Alcohol Abuse, Substance Abuse and Substance Induced Mood Disorder AXIS II:  Deferred AXIS III:  History reviewed. No pertinent past medical history. AXIS IV:  other psychosocial or environmental problems, problems related to social environment and problems with primary support group AXIS V:  61-70 mild symptoms  Level of Care:  IOP  Hospital Course:  On admission:  Taylor Wiggins presented to the ED with request for detox from alcohol and she endorses SI with no plan. Per [facility] assessment, Pt has one prior suicide attempt last Thurs when she attempted to OD by injecting herself w/ heroin. Pt says she has only used heroin 8 times in her life and last Thurs was first use in a few mos. She is unsure of amount injected. Pt was at [facility] 30 days ago but was transferred from [facility] to [facility] as [facility] was having funding issues. She notes she stayed clean and sober for 2 weeks but then relapsed.  Patient drowsy from Librium, information obtained from [facility] assessment.   Pt endorsed severe anxiety with frequent panic attacks with last panic attack today. She says she has no support system and is losing her housing next week when her roommate  moves out of state. Pt's BAL was 257 upon arrival and UDS was positive for benzos. Pt states she isn't prescribed Valium but takes approx. 2 mg to 5 mg daily to control panic attacks. She endorses depressed mood with isolating, loss of interest, anger/irritability, fatigue and early insomnia. Pt's parents are alcoholics and aren't supportive. Pt drinks alcohol every other day, approx. 2 lg bottles of wine. She isn't sure how much she drank tonight. Current withdrawal symptom is increased anxiety. Pt uses percocets 1-2 times per week, unsure of amount used. Pt has no hx of seizures and she has no court dates coming up. She says she really wants help and wants to get clean and sober and deal with issues in her life without using. Affect is depressed and anxious. She denies HI and denies [facility]. No delusions noted. She reports decreased concentration.   During hospitalization:  Librium protocol was implemented to assist patient to medically detox from alcohol, Vistaril given for anxiety, and Zoloft 25 mg for depression.  The Zoloft 25 mg for depression continued for after discharge.  She attended most groups with participation.  Patient denied suicidal/homicidal ideations and auditory/visual hallucinations, follow-up appointments encouraged to attend, outside support groups encouraged and information given.  She will go to [facility] three times a week to continue her sobriety and will start Friday.  Taylor Wiggins will go to [facility] for her psychiatry care.  Taylor Wiggins is mentally and physically stable for discharge, Rx and 14 day supply of medications given at discharge.  Consults:  None  Significant Diagnostic Studies:  labs:  Completed and reviewed, stable  Discharge Vitals:   Blood pressure 106/82, pulse 72, temperature 98.3 F (36.8 C), temperature source Oral, resp. rate 18, height 5\' 7"  (1.702 m), weight 75.751 kg (167 lb), last menstrual period 04/25/2013. Body mass index is 26.15 kg/(m^2). Lab Results:   Results for  orders placed during the hospital encounter of 04/25/13 (from the past 72 hour(s))  URINE RAPID DRUG SCREEN (HOSP PERFORMED)     Status: None   Collection Time    04/25/13  9:43 PM      Result Value Range   Opiates NONE DETECTED  NONE DETECTED   Cocaine NONE DETECTED  NONE DETECTED   Benzodiazepines NONE DETECTED  NONE DETECTED   Amphetamines NONE DETECTED  NONE DETECTED   Tetrahydrocannabinol NONE DETECTED  NONE DETECTED   Barbiturates NONE DETECTED  NONE DETECTED   Comment:            DRUG SCREEN FOR MEDICAL PURPOSES     ONLY.  IF CONFIRMATION IS NEEDED     FOR ANY PURPOSE, NOTIFY LAB     WITHIN 5 DAYS.                LOWEST DETECTABLE LIMITS     FOR URINE DRUG SCREEN     Drug Class       Cutoff (ng/mL)     Amphetamine      1000     Barbiturate      200     Benzodiazepine   200     Tricyclics       300     Opiates          300     Cocaine          300     THC              50  CBC     Status: Abnormal   Collection Time    04/25/13  9:50 PM      Result Value Range   WBC 6.7  4.0 - 10.5 K/uL   RBC 4.29  3.87 - 5.11 MIL/uL   Hemoglobin 13.6  12.0 - 15.0 g/dL   HCT 40.9  81.1 - 91.4 %   MCV 92.8  78.0 - 100.0 fL   MCH 31.7  26.0 - 34.0 pg   MCHC 34.2  30.0 - 36.0 g/dL   RDW 78.2  95.6 - 21.3 %   Platelets 432 (*) 150 - 400 K/uL  COMPREHENSIVE METABOLIC PANEL     Status: Abnormal   Collection Time    04/25/13  9:50 PM      Result Value Range   Sodium 138  135 - 145 mEq/L   Potassium 3.7  3.5 - 5.1 mEq/L   Chloride 103  96 - 112 mEq/L   CO2 23  19 - 32 mEq/L   Glucose, Bld 98  70 - 99 mg/dL   BUN 8  6 - 23 mg/dL   Creatinine, Ser 0.86  0.50 - 1.10 mg/dL   Calcium 9.1  8.4 - 57.8 mg/dL   Total Protein 7.7  6.0 - 8.3 g/dL   Albumin 4.2  3.5 - 5.2 g/dL   AST 18  0 - 37 U/L   ALT 17  0 - 35 U/L   Alkaline Phosphatase 56  39 - 117 U/L   Total Bilirubin 0.1 (*) 0.3 - 1.2 mg/dL   GFR calc non Af Amer >90  >90 mL/min  GFR calc Af Amer >90  >90 mL/min   Comment:             The eGFR has been calculated     using the CKD EPI equation.     This calculation has not been     validated in all clinical     situations.     eGFR's persistently     <90 mL/min signify     possible Chronic Kidney Disease.  ETHANOL     Status: Abnormal   Collection Time    04/25/13  9:50 PM      Result Value Range   Alcohol, Ethyl (B) 257 (*) 0 - 11 mg/dL   Comment:            LOWEST DETECTABLE LIMIT FOR     SERUM ALCOHOL IS 11 mg/dL     FOR MEDICAL PURPOSES ONLY  POCT PREGNANCY, URINE     Status: None   Collection Time    04/25/13  9:51 PM      Result Value Range   Preg Test, Ur NEGATIVE  NEGATIVE   Comment:            THE SENSITIVITY OF THIS     METHODOLOGY IS >24 mIU/mL    Physical Findings: AIMS: Facial and Oral Movements Muscles of Facial Expression: None, normal Lips and Perioral Area: None, normal Jaw: None, normal Tongue: None, normal,Extremity Movements Upper (arms, wrists, hands, fingers): None, normal Lower (legs, knees, ankles, toes): None, normal, Trunk Movements Neck, shoulders, hips: None, normal, Overall Severity Severity of abnormal movements (highest score from questions above): None, normal Incapacitation due to abnormal movements: None, normal Patient's awareness of abnormal movements (rate only patient's report): No Awareness, Dental Status Current problems with teeth and/or dentures?: No Does patient usually wear dentures?: No  CIWA:  CIWA-Ar Total: 0 COWS:  COWS Total Score: 1  Psychiatric Specialty Exam: See Psychiatric Specialty Exam and Suicide Risk Assessment completed by Attending Physician prior to discharge.  Discharge destination:  Home  Is patient on multiple antipsychotic therapies at discharge:  No   Has Patient had three or more failed trials of antipsychotic monotherapy by history:  No Recommended Plan for Multiple Antipsychotic Therapies:  N/A  Discharge Orders   Future Orders Complete By Expires     Activity as  tolerated - No restrictions  As directed     Diet - low sodium heart healthy  As directed         Medication List    STOP taking these medications       BENADRYL ALLERGY 25 MG tablet  Generic drug:  diphenhydrAMINE     diazepam 2 MG tablet  Commonly known as:  VALIUM      TAKE these medications     Indication   sertraline 25 MG tablet  Commonly known as:  ZOLOFT  Take 1 tablet (25 mg total) by mouth daily.   Indication:  Major Depressive Disorder           Follow-up Information   Follow up with [facility]. (Go to the walk-in clinic M-F between 8 and 9AM for your hospital follow up appointment.  This is where you will see a psychiatrist for your medications.  they also have a pharmacy there.)    Contact information:   [address] [place]  [phone]      Follow up with [facility] On 04/30/2013. (Resume your IOP groups on Friday)    Contact  information:    [address]  [place]  [phone]      Follow-up recommendations:  Activity:  As tolerated Diet:  Low-sodium heart healthy diet  Comments:  Patient will continue her care at [facility] and [facility].  Total Discharge Time:  Greater than 30 minutes.  SignedNanine Means, PMH-NP 04/28/2013, 9:43 AM

## 2013-05-03 NOTE — Progress Notes (Signed)
Patient Discharge Instructions:  After Visit Summary (AVS):   Faxed to:  05/03/13 Discharge Summary Note:   Faxed to:  05/03/13 Psychiatric Admission Assessment Note:   Faxed to:  05/03/13 Suicide Risk Assessment - Discharge Assessment:   Faxed to:  05/03/13 Faxed/Sent to the Next Level Care provider:  05/03/13 Faxed to [facility] @ [phone] Faxed to [facility] @ [phone]  Jerelene Redden, 05/03/2013, 3:16 PM

## 2014-02-15 ENCOUNTER — Encounter (HOSPITAL_COMMUNITY): Payer: Self-pay | Admitting: Emergency Medicine

## 2014-02-15 ENCOUNTER — Emergency Department (HOSPITAL_COMMUNITY)
Admission: EM | Admit: 2014-02-15 | Discharge: 2014-02-15 | Disposition: A | Discharge status: Home / Self Care | Payer: Self-pay | Attending: Emergency Medicine | Admitting: Emergency Medicine

## 2014-02-15 DIAGNOSIS — Z79899 Other long term (current) drug therapy: Secondary | ICD-10-CM | POA: Insufficient documentation

## 2014-02-15 DIAGNOSIS — K5641 Fecal impaction: Secondary | ICD-10-CM | POA: Insufficient documentation

## 2014-02-15 DIAGNOSIS — F17200 Nicotine dependence, unspecified, uncomplicated: Secondary | ICD-10-CM | POA: Insufficient documentation

## 2014-02-15 MED ORDER — POLYETHYLENE GLYCOL 3350 17 GM/SCOOP PO POWD: 17.0000 g | Freq: Every day | ORAL | Status: DC

## 2014-02-15 NOTE — ED Notes (Signed)
Pt c/o pain from impaction. Pt took two laxatives yesterday with no relief. Pt believes she may have pushed hemorrhoid out when straining bc she notes blood.  Pt states been over a week since last BM.

## 2014-02-15 NOTE — ED Provider Notes (Signed)
CSN: [id number]     Arrival date & time 02/15/14  0919 History   First MD Initiated Contact with Patient 02/15/14 0935     Chief Complaint  Patient presents with  . constipated      (Consider location/radiation/quality/duration/timing/severity/associated sxs/prior Treatment) HPI Patient reports constipation over the past week with worsening pain and inability to move her bowels today.  She states she feels like she has a large fecal impaction.  She manually tried to disimpact her without improvement in her symptoms.  She tried a fleets enema but had no relief.  She's been taking laxatives without improvement in her symptoms.   History reviewed. No pertinent past medical history. History reviewed. No pertinent past surgical history. No family history on file. History  Substance Use Topics  . Smoking status: Current Some Day Smoker -- 0.50 packs/day    Types: Cigarettes  . Smokeless tobacco: Not on file  . Alcohol Use: Yes     Comment: socially   OB History   Grav Para Term Preterm Abortions TAB SAB Ect Mult Living                 Review of Systems  All other systems reviewed and are negative.      Allergies  Review of patient's allergies indicates no known allergies.  Home Medications   Current Outpatient Rx  Name  Route  Sig  Dispense  Refill  . bisacodyl (LAXATIVE) 5 MG EC tablet   Oral   Take 10 mg by mouth once.         . docusate sodium (COLACE) 100 MG capsule   Oral   Take 100 mg by mouth 2 (two) times daily.         . mineral oil enema   Rectal   Place 1 enema rectally once.         Marland Kitchen omeprazole (PRILOSEC) 20 MG capsule   Oral   Take 20 mg by mouth 2 (two) times daily before a meal.         . polyethylene glycol powder (MIRALAX) powder   Oral   Take 17 g by mouth daily.   255 g   0    BP 120/93  Pulse 120  Temp(Src) 98.1 F (36.7 C) (Oral)  Resp 18  SpO2 100%  LMP 02/09/2014 Physical Exam  Nursing note and vitals  reviewed. Constitutional: She is oriented to person, place, and time. She appears well-developed and well-nourished.  HENT:  Head: Normocephalic.  Eyes: EOM are normal.  Neck: Normal range of motion.  Pulmonary/Chest: Effort normal.  Abdominal: She exhibits no distension.  Genitourinary:  Rectal exam demonstrates no gross bleeding.  Brown stool.  Large fecal impaction which was relieved with manual disimpaction.  Musculoskeletal: Normal range of motion.  Neurological: She is alert and oriented to person, place, and time.  Psychiatric: She has a normal mood and affect.    ED Course  Fecal disimpaction Date/Time: 02/15/2014 10:07 AM Performed by: Hoy Morn Authorized by: Hoy Morn Consent: Verbal consent obtained. Risks and benefits: risks, benefits and alternatives were discussed Consent given by: patient Local anesthesia used: no Patient sedated: no Patient tolerance: Patient tolerated the procedure well with no immediate complications. Comments: Large stool burden removed   (including critical care time) Labs Review Labs Reviewed - No data to display Imaging Review No results found.  EKG Interpretation   None       MDM   Final diagnoses:  Fecal  impaction    Disimpacted with significant improvement in symptoms    Hoy Morn, MD 02/15/14 1008

## 2014-02-15 NOTE — Discharge Instructions (Signed)

## 2014-02-16 ENCOUNTER — Encounter (HOSPITAL_COMMUNITY): Payer: Self-pay | Admitting: Emergency Medicine

## 2014-02-16 ENCOUNTER — Emergency Department (INDEPENDENT_AMBULATORY_CARE_PROVIDER_SITE_OTHER)
Admission: EM | Admit: 2014-02-16 | Discharge: 2014-02-16 | Disposition: A | Discharge status: Home / Self Care | Payer: Self-pay | Source: Home / Self Care | Attending: Family Medicine | Admitting: Family Medicine

## 2014-02-16 DIAGNOSIS — J410 Simple chronic bronchitis: Secondary | ICD-10-CM

## 2014-02-16 DIAGNOSIS — Z720 Tobacco use: Secondary | ICD-10-CM

## 2014-02-16 DIAGNOSIS — J40 Bronchitis, not specified as acute or chronic: Principal | ICD-10-CM

## 2014-02-16 DIAGNOSIS — N390 Urinary tract infection, site not specified: Secondary | ICD-10-CM

## 2014-02-16 LAB — POCT URINALYSIS DIP (DEVICE)
BILIRUBIN URINE: NEGATIVE
GLUCOSE, UA: NEGATIVE mg/dL
Ketones, ur: NEGATIVE mg/dL
Nitrite: NEGATIVE
Protein, ur: NEGATIVE mg/dL
Specific Gravity, Urine: <=1.005 (ref 1.005–1.030)
Urobilinogen, UA: 0.2 mg/dL (ref 0.0–1.0)
pH: 5.5 (ref 5.0–8.0)

## 2014-02-16 LAB — POCT PREGNANCY, URINE: Preg Test, Ur: NEGATIVE

## 2014-02-16 MED ORDER — CEPHALEXIN 500 MG PO CAPS: 500.0000 mg | ORAL_CAPSULE | Freq: Four times a day (QID) | ORAL | Status: DC

## 2014-02-16 NOTE — ED Provider Notes (Signed)
CSN: [id number]     Arrival date & time 02/16/14  1420 History   First MD Initiated Contact with Patient 02/16/14 1636     Chief Complaint  Patient presents with  . Cough     (Consider location/radiation/quality/duration/timing/severity/associated sxs/prior Treatment) Patient is a 33 y.o. female presenting with cough and frequency. The history is provided by the patient.  Cough Cough characteristics:  Productive Sputum characteristics:  Yellow Severity:  Mild Onset quality:  Gradual Duration:  6 days Chronicity:  New Smoker: yes   Associated symptoms: no chest pain, no chills, no fever and no wheezing   Urinary Frequency This is a new problem. The current episode started more than 1 week ago (2-3 wks). The problem has not changed since onset.Pertinent negatives include no chest pain and no abdominal pain.    History reviewed. No pertinent past medical history. History reviewed. No pertinent past surgical history. History reviewed. No pertinent family history. History  Substance Use Topics  . Smoking status: Current Some Day Smoker -- 0.50 packs/day    Types: Cigarettes  . Smokeless tobacco: Not on file  . Alcohol Use: Yes     Comment: socially   OB History   Grav Para Term Preterm Abortions TAB SAB Ect Mult Living                 Review of Systems  Constitutional: Negative.  Negative for fever and chills.  Respiratory: Positive for cough. Negative for wheezing.   Cardiovascular: Negative for chest pain.  Gastrointestinal: Negative.  Negative for abdominal pain.  Genitourinary: Positive for dysuria, urgency and frequency.      Allergies  Review of patient's allergies indicates no known allergies.  Home Medications   Current Outpatient Rx  Name  Route  Sig  Dispense  Refill  . bisacodyl (LAXATIVE) 5 MG EC tablet   Oral   Take 10 mg by mouth once.         . cephALEXin (KEFLEX) 500 MG capsule   Oral   Take 1 capsule (500 mg total) by mouth 4 (four) times  daily. Take all of medicine and drink lots of fluids   28 capsule   0   . docusate sodium (COLACE) 100 MG capsule   Oral   Take 100 mg by mouth 2 (two) times daily.         . mineral oil enema   Rectal   Place 1 enema rectally once.         Marland Kitchen omeprazole (PRILOSEC) 20 MG capsule   Oral   Take 20 mg by mouth 2 (two) times daily before a meal.         . polyethylene glycol powder (MIRALAX) powder   Oral   Take 17 g by mouth daily.   255 g   0    BP 99/65  Pulse 91  Temp(Src) 98.1 F (36.7 C) (Oral)  Resp 18  SpO2 100%  LMP 02/09/2014 Physical Exam  Nursing note and vitals reviewed. Constitutional: She is oriented to person, place, and time. She appears well-developed and well-nourished.  Neck: Normal range of motion. Neck supple.  Cardiovascular: Normal rate, regular rhythm, normal heart sounds and intact distal pulses.   Pulmonary/Chest: Effort normal and breath sounds normal.  Abdominal: Soft. Bowel sounds are normal.  Lymphadenopathy:    She has no cervical adenopathy.  Neurological: She is alert and oriented to person, place, and time.  Skin: Skin is warm and dry.    ED  Course  Procedures (including critical care time) Labs Review Labs Reviewed  POCT URINALYSIS DIP (DEVICE) - Abnormal; Notable for the following:    Hgb urine dipstick TRACE (*)    Leukocytes, UA TRACE (*)    All other components within normal limits  POCT PREGNANCY, URINE   Imaging Review No results found.    MDM   Final diagnoses:  Bronchitis due to tobacco use  UTI (lower urinary tract infection)        Billy Fischer, MD 02/16/14 [phone]

## 2014-02-16 NOTE — ED Notes (Signed)
2 complaints. C/o cough x past 6 weeks, no relief w OTC medication (asthma, smokes ) also concern for poss UTI, as she has pain every time she urinates

## 2014-02-16 NOTE — Discharge Instructions (Signed)
Take all of medicine, drink lots of fluids, no more smoking, see your doctor if further problems  °

## 2014-05-24 ENCOUNTER — Emergency Department: Payer: Self-pay | Admitting: Emergency Medicine

## 2014-05-24 LAB — CBC
HCT: 36.1 % (ref 35.0–47.0)
HGB: 12.2 g/dL (ref 12.0–16.0)
MCH: 31.3 pg (ref 26.0–34.0)
MCHC: 33.7 g/dL (ref 32.0–36.0)
MCV: 93 fL (ref 80–100)
PLATELETS: 353 10*3/uL (ref 150–440)
RBC: 3.89 10*6/uL (ref 3.80–5.20)
RDW: 13.6 % (ref 11.5–14.5)
WBC: 17.5 10*3/uL — AB (ref 3.6–11.0)

## 2014-05-24 LAB — GC/CHLAMYDIA PROBE AMP

## 2014-05-24 LAB — WET PREP, GENITAL

## 2014-05-24 LAB — HCG, QUANTITATIVE, PREGNANCY: Beta Hcg, Quant.: 19128 m[IU]/mL — ABNORMAL HIGH

## 2014-06-25 ENCOUNTER — Emergency Department: Payer: Self-pay | Admitting: Emergency Medicine

## 2014-06-25 ENCOUNTER — Emergency Department (INDEPENDENT_AMBULATORY_CARE_PROVIDER_SITE_OTHER)
Admission: EM | Admit: 2014-06-25 | Discharge: 2014-06-25 | Disposition: A | Discharge status: Home / Self Care | Payer: Self-pay | Source: Home / Self Care | Attending: Family Medicine | Admitting: Family Medicine

## 2014-06-25 ENCOUNTER — Encounter (HOSPITAL_COMMUNITY): Payer: Self-pay | Admitting: Emergency Medicine

## 2014-06-25 DIAGNOSIS — M549 Dorsalgia, unspecified: Secondary | ICD-10-CM

## 2014-06-25 DIAGNOSIS — Z041 Encounter for examination and observation following transport accident: Secondary | ICD-10-CM

## 2014-06-25 MED ORDER — DICLOFENAC POTASSIUM 50 MG PO TABS: 50.0000 mg | ORAL_TABLET | Freq: Three times a day (TID) | ORAL | Status: DC

## 2014-06-25 MED ORDER — METAXALONE 800 MG PO TABS: 800.0000 mg | ORAL_TABLET | Freq: Three times a day (TID) | ORAL | Status: DC
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
## 2014-06-27 ENCOUNTER — Emergency Department (HOSPITAL_COMMUNITY)
Admission: EM | Admit: 2014-06-27 | Discharge: 2014-06-27 | Discharge status: Against Medical Advice | Payer: Self-pay | Attending: Emergency Medicine | Admitting: Emergency Medicine

## 2014-06-27 ENCOUNTER — Encounter (HOSPITAL_COMMUNITY): Payer: Self-pay | Admitting: Emergency Medicine

## 2014-06-27 DIAGNOSIS — F17200 Nicotine dependence, unspecified, uncomplicated: Secondary | ICD-10-CM | POA: Insufficient documentation

## 2014-06-27 DIAGNOSIS — R1013 Epigastric pain: Secondary | ICD-10-CM | POA: Insufficient documentation

## 2014-06-27 LAB — COMPREHENSIVE METABOLIC PANEL
ALT: 14 U/L (ref 0–35)
AST: 21 U/L (ref 0–37)
Albumin: 4.4 g/dL (ref 3.5–5.2)
Alkaline Phosphatase: 58 U/L (ref 39–117)
BUN: 17 mg/dL (ref 6–23)
CALCIUM: 8.8 mg/dL (ref 8.4–10.5)
CO2: 19 meq/L (ref 19–32)
Chloride: 105 mEq/L (ref 96–112)
Creatinine, Ser: 0.74 mg/dL (ref 0.50–1.10)
GLUCOSE: 130 mg/dL — AB (ref 70–99)
Potassium: 3.6 mEq/L — ABNORMAL LOW (ref 3.7–5.3)
Sodium: 142 mEq/L (ref 137–147)
Total Bilirubin: 0.5 mg/dL (ref 0.3–1.2)
Total Protein: 7.5 g/dL (ref 6.0–8.3)

## 2014-06-27 LAB — CBC WITH DIFFERENTIAL/PLATELET
Basophils Absolute: 0 10*3/uL (ref 0.0–0.1)
Basophils Relative: 0 % (ref 0–1)
EOS PCT: 0 % (ref 0–5)
Eosinophils Absolute: 0.1 10*3/uL (ref 0.0–0.7)
HCT: 41.3 % (ref 36.0–46.0)
HEMOGLOBIN: 14.1 g/dL (ref 12.0–15.0)
LYMPHS ABS: 1.1 10*3/uL (ref 0.7–4.0)
LYMPHS PCT: 5 % — AB (ref 12–46)
MCH: 30.7 pg (ref 26.0–34.0)
MCHC: 34.1 g/dL (ref 30.0–36.0)
MCV: 90 fL (ref 78.0–100.0)
MONO ABS: 2.3 10*3/uL — AB (ref 0.1–1.0)
MONOS PCT: 11 % (ref 3–12)
Neutro Abs: 17.3 10*3/uL — ABNORMAL HIGH (ref 1.7–7.7)
Neutrophils Relative %: 84 % — ABNORMAL HIGH (ref 43–77)
Platelets: 440 10*3/uL — ABNORMAL HIGH (ref 150–400)
RBC: 4.59 MIL/uL (ref 3.87–5.11)
RDW: 13.6 % (ref 11.5–15.5)
WBC: 20.9 10*3/uL — AB (ref 4.0–10.5)

## 2014-06-27 LAB — LIPASE, BLOOD: Lipase: 58 U/L (ref 11–59)

## 2014-06-27 MED ORDER — ONDANSETRON 8 MG PO TBDP
8.0000 mg | ORAL_TABLET | Freq: Once | ORAL | Status: AC
  Administered 2014-06-27: 8 mg via ORAL
  Filled 2014-06-27: qty 1

## 2014-06-27 NOTE — ED Notes (Signed)
Per pt, has hx of pancreatitis with alcohol.  Pt here with epigastric/abdominal pain and n/v.  Pt did drink alcohol last night.

## 2014-07-27 ENCOUNTER — Encounter (HOSPITAL_COMMUNITY): Payer: Self-pay | Admitting: Emergency Medicine

## 2014-07-27 ENCOUNTER — Emergency Department (INDEPENDENT_AMBULATORY_CARE_PROVIDER_SITE_OTHER)
Admission: EM | Admit: 2014-07-27 | Discharge: 2014-07-27 | Disposition: A | Discharge status: Home / Self Care | Payer: Self-pay | Source: Home / Self Care | Attending: Family Medicine | Admitting: Family Medicine

## 2014-07-27 DIAGNOSIS — K529 Noninfective gastroenteritis and colitis, unspecified: Secondary | ICD-10-CM

## 2014-07-27 DIAGNOSIS — K5289 Other specified noninfective gastroenteritis and colitis: Secondary | ICD-10-CM

## 2014-07-27 MED ORDER — ONDANSETRON 4 MG PO TBDP
ORAL_TABLET | ORAL | Status: AC
  Filled 2014-07-27: qty 1

## 2014-07-27 MED ORDER — ONDANSETRON HCL 4 MG PO TABS: 4.0000 mg | ORAL_TABLET | Freq: Four times a day (QID) | ORAL | Status: DC

## 2014-07-27 MED ORDER — ONDANSETRON 4 MG PO TBDP
4.0000 mg | ORAL_TABLET | Freq: Once | ORAL | Status: AC
  Administered 2014-07-27: 4 mg via ORAL

## 2014-07-27 NOTE — ED Provider Notes (Signed)
CSN: [id number]     Arrival date & time 07/27/14  1824 History   First MD Initiated Contact with Patient 07/27/14 1853     Chief Complaint  Patient presents with  . Emesis   (Consider location/radiation/quality/duration/timing/severity/associated sxs/prior Treatment) Patient is a 33 y.o. female presenting with vomiting. The history is provided by the patient.  Emesis Severity:  Mild Duration:  12 hours Quality:  Stomach contents Progression:  Unchanged Chronicity:  New Relieved by:  None tried Worsened by:  Nothing tried Ineffective treatments:  None tried Associated symptoms: no chills and no fever   Risk factors: suspect food intake   Risk factors: not pregnant now and no sick contacts     History reviewed. No pertinent past medical history. History reviewed. No pertinent past surgical history. History reviewed. No pertinent family history. History  Substance Use Topics  . Smoking status: Current Some Day Smoker -- 0.50 packs/day    Types: Cigarettes  . Smokeless tobacco: Not on file  . Alcohol Use: Yes     Comment: socially   OB History   Grav Para Term Preterm Abortions TAB SAB Ect Mult Living                 Review of Systems  Constitutional: Negative for chills.  Gastrointestinal: Positive for vomiting.    Allergies  Tramadol  Home Medications   Prior to Admission medications   Medication Sig Start Date End Date Taking? Authorizing Provider  bisacodyl (LAXATIVE) 5 MG EC tablet Take 10 mg by mouth once.    Historical Provider, MD  cephALEXin (KEFLEX) 500 MG capsule Take 1 capsule (500 mg total) by mouth 4 (four) times daily. Take all of medicine and drink lots of fluids 02/16/14   Billy Fischer, MD  diclofenac (CATAFLAM) 50 MG tablet Take 1 tablet (50 mg total) by mouth 3 (three) times daily. For back pain 06/25/14   Billy Fischer, MD  docusate sodium (COLACE) 100 MG capsule Take 100 mg by mouth 2 (two) times daily.    Historical Provider, MD  metaxalone  (SKELAXIN) 800 MG tablet Take 1 tablet (800 mg total) by mouth 3 (three) times daily. As muscle relaxer 06/25/14   Billy Fischer, MD  mineral oil enema Place 1 enema rectally once.    Historical Provider, MD  omeprazole (PRILOSEC) 20 MG capsule Take 20 mg by mouth 2 (two) times daily before a meal.    Historical Provider, MD  ondansetron (ZOFRAN) 4 MG tablet Take 1 tablet (4 mg total) by mouth every 6 (six) hours. Prn n/v. 07/27/14   Billy Fischer, MD  polyethylene glycol powder (MIRALAX) powder Take 17 g by mouth daily. 02/15/14   Hoy Morn, MD   BP 115/84  Pulse 103  Temp(Src) 97.6 F (36.4 C) (Oral)  Resp 19  SpO2 98%  LMP 07/27/2014 Physical Exam  Nursing note and vitals reviewed. Constitutional: She is oriented to person, place, and time. She appears well-developed and well-nourished.  HENT:  Mouth/Throat: Oropharynx is clear and moist.  Neck: Normal range of motion. Neck supple.  Abdominal: Soft. Bowel sounds are normal. She exhibits no distension and no mass. There is no tenderness. There is no rebound and no guarding.  Neurological: She is alert and oriented to person, place, and time.  Skin: Skin is warm and dry.    ED Course  Procedures (including critical care time) Labs Review Labs Reviewed - No data to display  Imaging Review No results  found.   MDM   1. Gastroenteritis, acute        Billy Fischer, MD 07/27/14 [phone]

## 2014-07-27 NOTE — ED Notes (Signed)
C/o  Nausea and vomiting on set early this a.m.  States "I ate some chicken salad yesterday that kind of tasted funny".   Denies diarrhea and fever.  No otc meds taken.

## 2014-07-30 NOTE — ED Notes (Signed)
Pt  Called  Requesting  A  Work  Note  Extension to  Return today  Dr  Jake Michaelis  Notified     Ok     Left a  Message  On pts    Phone  To pick it  Up at  [facility]	[place]

## 2014-09-16 ENCOUNTER — Emergency Department (HOSPITAL_COMMUNITY)
Admission: EM | Admit: 2014-09-16 | Discharge: 2014-09-16 | Disposition: A | Discharge status: Home / Self Care | Payer: Self-pay | Attending: Emergency Medicine | Admitting: Emergency Medicine

## 2014-09-16 ENCOUNTER — Encounter (HOSPITAL_COMMUNITY): Payer: Self-pay | Admitting: Emergency Medicine

## 2014-09-16 DIAGNOSIS — K5904 Chronic idiopathic constipation: Secondary | ICD-10-CM

## 2014-09-16 DIAGNOSIS — F17200 Nicotine dependence, unspecified, uncomplicated: Secondary | ICD-10-CM | POA: Insufficient documentation

## 2014-09-16 DIAGNOSIS — Z79899 Other long term (current) drug therapy: Secondary | ICD-10-CM | POA: Insufficient documentation

## 2014-09-16 DIAGNOSIS — K5909 Other constipation: Secondary | ICD-10-CM | POA: Insufficient documentation

## 2014-09-16 DIAGNOSIS — K5900 Constipation, unspecified: Secondary | ICD-10-CM | POA: Insufficient documentation

## 2014-09-16 HISTORY — DX: Irritable bowel syndrome, unspecified: K58.9

## 2014-09-16 NOTE — Progress Notes (Signed)
[place],  Did not get to see patient but will be sending information about [facility] to help patient establish primary care, using the address provided.

## 2014-09-16 NOTE — ED Provider Notes (Signed)
CSN: [id number]     Arrival date & time 09/16/14  8841 History   First MD Initiated Contact with Patient 09/16/14 [phone]     Chief Complaint  Patient presents with  . Constipation     (Consider location/radiation/quality/duration/timing/severity/associated sxs/prior Treatment) Patient is a 33 y.o. female presenting with constipation.  Constipation  She presents for evaluation of constipation, defined by no stooling in 2 days. She tried that today, without relief. She was too uncomfortable to go to work, so she came here for evaluation. She denies fever, vomiting, cough, chest pain, weakness, or dizziness. She has been eating well. She's had this previously with her IBD. There are no other known modifying factors.  Past Medical History  Diagnosis Date  . IBS (irritable bowel syndrome)    History reviewed. No pertinent past surgical history. No family history on file. History  Substance Use Topics  . Smoking status: Current Some Day Smoker -- 0.50 packs/day    Types: Cigarettes  . Smokeless tobacco: Not on file  . Alcohol Use: Yes     Comment: socially   OB History   Grav Para Term Preterm Abortions TAB SAB Ect Mult Living                 Review of Systems  Gastrointestinal: Positive for constipation.  All other systems reviewed and are negative.     Allergies  Tramadol  Home Medications   Prior to Admission medications   Medication Sig Start Date End Date Taking? Authorizing Provider  docusate sodium (COLACE) 100 MG capsule Take 100 mg by mouth daily.    Yes Historical Provider, MD  Multiple Vitamin (MULTIVITAMIN WITH MINERALS) TABS tablet Take 1 tablet by mouth daily.   Yes Historical Provider, MD   BP 101/71  Pulse 94  Temp(Src) 98.7 F (37.1 C) (Oral)  SpO2 100%  LMP 08/26/2014 Physical Exam  Nursing note and vitals reviewed. Constitutional: She is oriented to person, place, and time. She appears well-developed and well-nourished.  HENT:  Head:  Normocephalic and atraumatic.  Eyes: Conjunctivae and EOM are normal. Pupils are equal, round, and reactive to light.  Neck: Normal range of motion and phonation normal. Neck supple.  Cardiovascular: Normal rate and regular rhythm.   Pulmonary/Chest: Effort normal and breath sounds normal. She exhibits no tenderness.  Abdominal: Soft. She exhibits no distension. There is no tenderness. There is no guarding.  The abdomen is nontender to palpation and not distended  Genitourinary:  Anus normal. No stool in rectum. No fecal impaction.  Musculoskeletal: Normal range of motion.  Neurological: She is alert and oriented to person, place, and time. She exhibits normal muscle tone.  Skin: Skin is warm and dry.  Psychiatric: She has a normal mood and affect. Her behavior is normal. Judgment and thought content normal.    ED Course  Procedures (including critical care time)  Findings discussed with patient, questions answered. She prefers to try home treatments, with over-the-counter medications for stooling enhancement.  Labs Review Labs Reviewed - No data to display  Imaging Review No results found.   EKG Interpretation None      MDM   Final diagnoses:  Constipation - functional    Recurrent constipation, related to irritable bowel disease. Doubt significant infection or metabolic instability.  Nursing Notes Reviewed/ Care Coordinated Applicable Imaging Reviewed Interpretation of Laboratory Data incorporated into ED treatment  The patient appears reasonably screened and/or stabilized for discharge and I doubt any other medical condition or other [facility]  requiring further screening, evaluation, or treatment in the ED at this time prior to discharge.  Plan: Home Medications- OTC stool softeren and enema prn; Home Treatments- rest; return here if the recommended treatment, does not improve the symptoms; Recommended follow up- PCP prn     Richarda Blade, MD 09/16/14 [phone]

## 2014-09-16 NOTE — ED Notes (Signed)
Pt c/ constipation stating last BM was 3 days ago. States she did enema about 15 mins ago w/o relief. Pt states she has IBS ans usually goes everyday or other day, pt states she is normally on Miralax but has not had it in the past week. Pain in rectum.

## 2014-09-16 NOTE — Discharge Instructions (Signed)
Use magnesium citrate, one half bottle today, and one half bottle tomorrow. Other possible treatments include MiraLax 2-3 times a day until you have a soft bowel movement, and  2-3 enemas today to encourage stooling. See the doctor of your choice, if needed, for problems.  Constipation Constipation is when a person has fewer than three bowel movements a week, has difficulty having a bowel movement, or has stools that are dry, hard, or larger than normal. As people grow older, constipation is more common. If you try to fix constipation with medicines that make you have a bowel movement (laxatives), the problem may get worse. Long-term laxative use may cause the muscles of the colon to become weak. A low-fiber diet, not taking in enough fluids, and taking certain medicines may make constipation worse.  CAUSES   Certain medicines, such as antidepressants, pain medicine, iron supplements, antacids, and water pills.   Certain diseases, such as diabetes, irritable bowel syndrome (IBS), thyroid disease, or depression.   Not drinking enough water.   Not eating enough fiber-rich foods.   Stress or travel.   Lack of physical activity or exercise.   Ignoring the urge to have a bowel movement.   Using laxatives too much.  SIGNS AND SYMPTOMS   Having fewer than three bowel movements a week.   Straining to have a bowel movement.   Having stools that are hard, dry, or larger than normal.   Feeling full or bloated.   Pain in the lower abdomen.   Not feeling relief after having a bowel movement.  DIAGNOSIS  Your health care provider will take a medical history and perform a physical exam. Further testing may be done for severe constipation. Some tests may include:  A barium enema X-ray to examine your rectum, colon, and, sometimes, your small intestine.   A sigmoidoscopy to examine your lower colon.   A colonoscopy to examine your entire colon. TREATMENT  Treatment will  depend on the severity of your constipation and what is causing it. Some dietary treatments include drinking more fluids and eating more fiber-rich foods. Lifestyle treatments may include regular exercise. If these diet and lifestyle recommendations do not help, your health care provider may recommend taking over-the-counter laxative medicines to help you have bowel movements. Prescription medicines may be prescribed if over-the-counter medicines do not work.  HOME CARE INSTRUCTIONS   Eat foods that have a lot of fiber, such as fruits, vegetables, whole grains, and beans.  Limit foods high in fat and processed sugars, such as french fries, hamburgers, cookies, candies, and soda.   A fiber supplement may be added to your diet if you cannot get enough fiber from foods.   Drink enough fluids to keep your urine clear or pale yellow.   Exercise regularly or as directed by your health care provider.   Go to the restroom when you have the urge to go. Do not hold it.   Only take over-the-counter or prescription medicines as directed by your health care provider. Do not take other medicines for constipation without talking to your health care provider first.  [place] IF:   You have bright red blood in your stool.   Your constipation lasts for more than 4 days or gets worse.   You have abdominal or rectal pain.   You have thin, pencil-like stools.   You have unexplained weight loss. MAKE SURE YOU:   Understand these instructions.  Will watch your condition.  Will get help right  away if you are not doing well or get worse. Document Released: 09/13/2004 Document Revised: 12/21/2013 Document Reviewed: 09/27/2013 [facility] Patient Information 2015 [place], [place]. This information is not intended to replace advice given to you by your health care provider. Make sure you discuss any questions you have with your health care provider.

## 2014-10-06 ENCOUNTER — Encounter (HOSPITAL_COMMUNITY): Payer: Self-pay | Admitting: Emergency Medicine

## 2014-10-06 ENCOUNTER — Emergency Department (HOSPITAL_COMMUNITY)
Admission: EM | Admit: 2014-10-06 | Discharge: 2014-10-06 | Disposition: A | Discharge status: Home / Self Care | Payer: Self-pay | Attending: Emergency Medicine | Admitting: Emergency Medicine

## 2014-10-06 DIAGNOSIS — Z8719 Personal history of other diseases of the digestive system: Secondary | ICD-10-CM | POA: Insufficient documentation

## 2014-10-06 DIAGNOSIS — Z79899 Other long term (current) drug therapy: Secondary | ICD-10-CM | POA: Insufficient documentation

## 2014-10-06 DIAGNOSIS — S025XXA Fracture of tooth (traumatic), initial encounter for closed fracture: Secondary | ICD-10-CM | POA: Insufficient documentation

## 2014-10-06 DIAGNOSIS — X58XXXA Exposure to other specified factors, initial encounter: Secondary | ICD-10-CM | POA: Insufficient documentation

## 2014-10-06 DIAGNOSIS — K029 Dental caries, unspecified: Secondary | ICD-10-CM | POA: Insufficient documentation

## 2014-10-06 DIAGNOSIS — K0889 Other specified disorders of teeth and supporting structures: Secondary | ICD-10-CM

## 2014-10-06 DIAGNOSIS — Y9289 Other specified places as the place of occurrence of the external cause: Secondary | ICD-10-CM | POA: Insufficient documentation

## 2014-10-06 DIAGNOSIS — Y9389 Activity, other specified: Secondary | ICD-10-CM | POA: Insufficient documentation

## 2014-10-06 DIAGNOSIS — Z87891 Personal history of nicotine dependence: Secondary | ICD-10-CM | POA: Insufficient documentation

## 2014-10-06 MED ORDER — DOXYCYCLINE HYCLATE 100 MG PO CAPS: 100.0000 mg | ORAL_CAPSULE | Freq: Two times a day (BID) | ORAL | Status: DC

## 2014-10-06 MED ORDER — HYDROCODONE-ACETAMINOPHEN 5-325 MG PO TABS: 1.0000 | ORAL_TABLET | Freq: Four times a day (QID) | ORAL | Status: DC | PRN

## 2014-10-06 MED ORDER — NAPROXEN 500 MG PO TABS: 500.0000 mg | ORAL_TABLET | Freq: Two times a day (BID) | ORAL | Status: DC | PRN

## 2014-10-06 NOTE — Discharge Instructions (Signed)
Apply warm compresses to jaw throughout the day. Take antibiotic until finished and avoid direct sunlight while taking medication. Take naprosyn and norco as directed, as needed for pain but do not drive or operate machinery with pain medication use. Followup with a dentist is very important for ongoing evaluation and management of recurrent dental pain. Return to emergency department for emergent changing or worsening symptoms.   Dental Pain A tooth ache may be caused by cavities (tooth decay). Cavities expose the nerve of the tooth to air and hot or cold temperatures. It may come from an infection or abscess (also called a boil or furuncle) around your tooth. It is also often caused by dental caries (tooth decay). This causes the pain you are having. DIAGNOSIS  Your caregiver can diagnose this problem by exam. TREATMENT   If caused by an infection, it may be treated with medications which kill germs (antibiotics) and pain medications as prescribed by your caregiver. Take medications as directed.  Only take over-the-counter or prescription medicines for pain, discomfort, or fever as directed by your caregiver.  Whether the tooth ache today is caused by infection or dental disease, you should see your dentist as soon as possible for further care. SEEK MEDICAL CARE IF: The exam and treatment you received today has been provided on an emergency basis only. This is not a substitute for complete medical or dental care. If your problem worsens or new problems (symptoms) appear, and you are unable to meet with your dentist, call or return to this location. SEEK IMMEDIATE MEDICAL CARE IF:   You have a fever.  You develop redness and swelling of your face, jaw, or neck.  You are unable to open your mouth.  You have severe pain uncontrolled by pain medicine. MAKE SURE YOU:   Understand these instructions.  Will watch your condition.  Will get help right away if you are not doing well or get  worse. Document Released: 12/16/2005 Document Revised: 03/09/2012 Document Reviewed: 08/03/2008 [facility] Patient Information 2015 [place], [place]. This information is not intended to replace advice given to you by your health care provider. Make sure you discuss any questions you have with your health care provider.  Dental Fracture You have a dental fracture or injury. This can mean the tooth is loose, has a chip in the enamel or is broken. If just the outer enamel is chipped, there is a good chance the tooth will not become infected. The only treatment needed may be to smooth off a rough edge. Fractures into the deeper layers (dentin and pulp) cause greater pain and are more likely to become infected. These require you to see a dentist as soon as possible to save the tooth. Loose teeth may need to be wired or bonded with a plastic splint to hold them in place. A paste may be painted on the open area of the broken tooth to reduce the pain. Antibiotics and pain medicine may be prescribed. Choosing a soft or liquid diet and rinsing the mouth out with warm water after meals may be helpful. See your dentist as recommended. Failure to seek care or follow up with a dentist or other specialist as recommended could result in the loss of your tooth, infection, or permanent dental problems. SEEK MEDICAL CARE IF:   You have increased pain not controlled with medicines.  You have swelling around the tooth, in the face or neck.  You have bleeding which starts, continues, or gets worse.  You have a  fever. Document Released: 01/23/2005 Document Revised: 03/09/2012 Document Reviewed: 11/07/2009 [facility] Patient Information 2015 [place], [place]. This information is not intended to replace advice given to you by your health care provider. Make sure you discuss any questions you have with your health care provider.  Tooth Loss Another word for a tooth getting knocked out is avulsion. When a tooth is knocked out,  treatment includes controlling bleeding and pain. Permanent teeth may be successfully reimplanted in some cases. The sooner the tooth is cleaned and replaced in the proper position in the socket, the better the chance it can be saved. Evaluation by a dentist as soon as possible is needed so that the tooth may be positioned and stabilized. A temporary splint may be used to hold the tooth in place for the first 3 to 4 weeks. A splint joins the weak tooth to a strong tooth to increase the strength of the weak tooth. Baby teeth that are knocked out do not need to be reimplanted. Reimplantation can be helped with emergency care if the entire tooth has been knocked out. This type of repair can be done only if the tooth can be reinserted within 2 hours of the accident. It is best if it can be done within a few minutes of the accident. A dentist should be seen as soon as possible. After 2 hours, the chances of saving the tooth are minimal. However, dental referral can be beneficial. Your dentist will discuss your options with you.  STEPS TO TAKE IF YOU LOSE A TOOTH  Do not handle the tooth by the root.  Wash the tooth off in clean water but not under a tap. Do not scrub the tooth.  You may try to replace the tooth in the socket. Gently bite down on it to get it in place.  If trying to replace the tooth does not work, keep the tooth in a glass of milk or water. You may keep it in your own mouth if there is no danger of swallowing it. However, this is not recommended for children. HOME CARE INSTRUCTIONS   You can use ice packs along your jaw to help control swelling and pain.  For the next few days, eat a liquid or soft diet and rinse your mouth out with warm water after meals.  Watch for signs of infection.  You should take all medications for pain and antibiotics as prescribed by your dentist.  An avulsed tooth will require stabilization for 1 to 2 months to ensure proper healing. This means it should  not move around. SEEK MEDICAL CARE IF:  Pain is becoming worse or uncontrollable with medication.  You have increased swelling in your face or around the reimplanted tooth.  You have an oral temperature above 102F (38.9C) not controlled by medication.  You cannot open your mouth. Document Released: 09/10/2001 Document Revised: 05/02/2014 Document Reviewed: 04/16/2010 [facility] Patient Information 2015 [place], [place]. This information is not intended to replace advice given to you by your health care provider. Make sure you discuss any questions you have with your health care provider.

## 2014-10-06 NOTE — ED Notes (Addendum)
Pt reports that tooth r/upper mouth broke off exposing root. She contacted her dentist and was advised to report to ED to be evaluated for pain and need for antibiotic.Pt took 3 Motrin and 3 Tylenol this afternoon

## 2014-10-06 NOTE — ED Provider Notes (Signed)
CSN: [id number]     Arrival date & time 10/06/14  1451 History   First MD Initiated Contact with Patient 10/06/14 1508     Chief Complaint  Patient presents with  . Dental Pain  . Dental Injury    tooth broke off     (Consider location/radiation/quality/duration/timing/severity/associated sxs/prior Treatment) HPI Comments: Taylor Wiggins is a 33 y.o. female with a PMHx of IBS, who presents to the ED with complaints of RU 1st molar (tooth #3) pain that began suddenly 2hrs PTA after the tooth fractured off while she was eating food. States she had known issues with this tooth and was supposed to have a root canal on this tooth but had not scheduled that yet. When the tooth fell out today she called her dentist who stated she could see her on Monday for removal of tooth but that pt needed to see ED for antibiotics. Pain is 10/10 throbbing constant nonradiating worse with pressure and unrelieved with tylenol and motrin. Pt is a nonsmoker and has no other dental issues. Denies any fevers, chills, congestion, drooling, trismus, trouble swallowing, sore throat, neck pain/swelling, gum swelling or drainage, HA, vision changes, paresthesias or weakness of facial expressions, oral bleeding, or prior dental abscesses. Denies hx of DM. No known antibiotic allergies.   Patient is a 33 y.o. female presenting with dental injury and tooth pain. The history is provided by the patient. No language interpreter was used.  Dental Injury This is a new problem. The current episode started today. The problem has been unchanged. Pertinent negatives include no abdominal pain, chills, congestion, coughing, fever, headaches, nausea, neck pain, numbness, rash, sore throat, swollen glands, vomiting or weakness. Nothing aggravates the symptoms. She has tried acetaminophen and NSAIDs for the symptoms. The treatment provided no relief.  Dental Pain Location:  Upper Upper teeth location:  3/RU 1st molar Quality:   Throbbing Severity:  Severe (10/10) Onset quality:  Sudden Duration:  2 hours Timing:  Constant Progression:  Unchanged Chronicity:  New Context: dental fracture   Relieved by:  Nothing Worsened by:  Pressure Ineffective treatments:  Acetaminophen and NSAIDs Associated symptoms: no congestion, no difficulty swallowing, no drooling, no facial pain, no facial swelling, no fever, no gum swelling, no headaches, no neck pain, no neck swelling, no oral bleeding and no trismus   Risk factors: no diabetes, sufficient dental care, no periodontal disease and no smoking     Past Medical History  Diagnosis Date  . IBS (irritable bowel syndrome)    History reviewed. No pertinent past surgical history. History reviewed. No pertinent family history. History  Substance Use Topics  . Smoking status: Former Smoker -- 0.50 packs/day    Types: Cigarettes    Quit date: 09/06/2014  . Smokeless tobacco: Not on file  . Alcohol Use: Yes     Comment: socially   OB History   Grav Para Term Preterm Abortions TAB SAB Ect Mult Living                 Review of Systems  Constitutional: Negative for fever and chills.  HENT: Positive for dental problem. Negative for congestion, drooling, facial swelling, sinus pressure, sore throat and trouble swallowing.   Respiratory: Negative for cough and choking.   Gastrointestinal: Negative for nausea, vomiting and abdominal pain.  Musculoskeletal: Negative for neck pain and neck stiffness.  Skin: Negative for color change and rash.  Neurological: Negative for facial asymmetry, speech difficulty, weakness, numbness and headaches.  Hematological: Negative for  adenopathy.   10 Systems reviewed and are negative for acute change except as noted in the HPI.    Allergies  Tramadol  Home Medications   Prior to Admission medications   Medication Sig Start Date End Date Taking? Authorizing Provider  docusate sodium (COLACE) 100 MG capsule Take 100 mg by mouth  daily.     Historical Provider, MD  Multiple Vitamin (MULTIVITAMIN WITH MINERALS) TABS tablet Take 1 tablet by mouth daily.    Historical Provider, MD   BP 129/76  Pulse 89  Temp(Src) 98.5 F (36.9 C) (Oral)  Resp 20  SpO2 99%  LMP 08/30/2014 Physical Exam  Nursing note and vitals reviewed. Constitutional: She is oriented to person, place, and time. Vital signs are normal. She appears well-developed and well-nourished. No distress.  VSS, NAD, holding her R face, has tooth with her  HENT:  Head: Normocephalic and atraumatic.  Nose: Nose normal.  Mouth/Throat: Uvula is midline, oropharynx is clear and moist and mucous membranes are normal. No trismus in the jaw. Abnormal dentition. No dental abscesses, uvula swelling or dental caries.    RU 1st molar tooth #3 fractured exposing root, exquisitely TTP without evidence of gingival swelling or abscess. Oropharynx clear, no trismus  Eyes: Conjunctivae and EOM are normal. Right eye exhibits no discharge. Left eye exhibits no discharge.  Neck: Normal range of motion. Neck supple.  Cardiovascular: Normal rate.   Pulmonary/Chest: Effort normal. No respiratory distress.  Abdominal: Normal appearance. She exhibits no distension.  Musculoskeletal: Normal range of motion.  Lymphadenopathy:    She has no cervical adenopathy.  Neurological: She is alert and oriented to person, place, and time. No cranial nerve deficit or sensory deficit.  CN 5 and 7 intact  Skin: Skin is warm, dry and intact. No rash noted.  No erythema or facial swelling  Psychiatric: She has a normal mood and affect.    ED Course  Procedures (including critical care time) Labs Review Labs Reviewed - No data to display  Imaging Review No results found.   EKG Interpretation None      MDM   Final diagnoses:  Dental decay  Pain in tooth  Tooth fracture, closed, initial encounter    33y/o female with dental fracture and pain. Sent here by dentist to have  antibiotics started so she could have tooth pulled Monday or Tuesday. No signs/symptoms of abscess but agree with prophylactic abx for upcoming dental procedure given that pt has exposed root. Declined dental injection block but will send home with pain meds. Discussed oragel use and warm compresses. Will give doxy as this is the cheaper alternative vs clinda. Pt understands to avoid sunlight.  Patient afebrile, non toxic appearing and swallowing secretions well. I stressed the importance of dental follow up for ultimate management of dental pain and pt has appt on Monday.  I have also discussed reasons to return immediately to the ER.  Patient expresses understanding and agrees with plan.  BP 129/76  Pulse 89  Temp(Src) 98.5 F (36.9 C) (Oral)  Resp 20  SpO2 99%  LMP 08/30/2014  Meds ordered this encounter  Medications  . naproxen (NAPROSYN) 500 MG tablet    Sig: Take 1 tablet (500 mg total) by mouth 2 (two) times daily as needed for mild pain, moderate pain or headache (TAKE WITH MEALS.).    Dispense:  20 tablet    Refill:  0    Order Specific Question:  Supervising Provider    Answer:  Sabra Heck,  BRIAN D [[id number]]  . HYDROcodone-acetaminophen (NORCO) 5-325 MG per tablet    Sig: Take 1 tablet by mouth every 6 (six) hours as needed for severe pain.    Dispense:  20 tablet    Refill:  0    Order Specific Question:  Supervising Provider    Answer:  Noemi Chapel D [[id number]]  . doxycycline (VIBRAMYCIN) 100 MG capsule    Sig: Take 1 capsule (100 mg total) by mouth 2 (two) times daily. One po bid x 7 days    Dispense:  14 capsule    Refill:  0    Order Specific Question:  Supervising Provider    Answer:  Johnna Acosta [address] Camprubi-Soms, PA-C 10/06/14 1542

## 2014-10-07 NOTE — ED Provider Notes (Signed)
Medical screening examination/treatment/procedure(s) were performed by non-physician practitioner and as supervising physician I was immediately available for consultation/collaboration.   EKG Interpretation None        Blanchie Dessert, MD 10/07/14 1255

## 2014-10-09 ENCOUNTER — Emergency Department: Payer: Self-pay | Admitting: Emergency Medicine

## 2014-10-14 ENCOUNTER — Emergency Department: Payer: Self-pay | Admitting: Emergency Medicine

## 2014-10-18 ENCOUNTER — Ambulatory Visit: Payer: Self-pay

## 2014-10-31 ENCOUNTER — Emergency Department: Payer: Self-pay | Admitting: Emergency Medicine

## 2014-10-31 LAB — COMPREHENSIVE METABOLIC PANEL
ALT: 20 U/L
ANION GAP: 14 (ref 7–16)
Albumin: 3.5 g/dL (ref 3.4–5.0)
Alkaline Phosphatase: 52 U/L
BILIRUBIN TOTAL: 0.3 mg/dL (ref 0.2–1.0)
BUN: 14 mg/dL (ref 7–18)
CHLORIDE: 107 mmol/L (ref 98–107)
CO2: 22 mmol/L (ref 21–32)
Calcium, Total: 8.6 mg/dL (ref 8.5–10.1)
Creatinine: 0.7 mg/dL (ref 0.60–1.30)
EGFR (Non-African Amer.): >60
Glucose: 106 mg/dL — ABNORMAL HIGH (ref 65–99)
Osmolality: 286 (ref 275–301)
Potassium: 3.6 mmol/L (ref 3.5–5.1)
SGOT(AST): 20 U/L (ref 15–37)
SODIUM: 143 mmol/L (ref 136–145)
Total Protein: 6.4 g/dL (ref 6.4–8.2)

## 2014-10-31 LAB — DIFFERENTIAL
BASOS ABS: 0.1 10*3/uL (ref 0.0–0.1)
Basophil %: 0.3 %
EOS ABS: 0.2 10*3/uL (ref 0.0–0.7)
Eosinophil %: 1.1 %
LYMPHS PCT: 8.3 %
Lymphocyte #: 1.5 10*3/uL (ref 1.0–3.6)
MONO ABS: 1.4 x10 3/mm — AB (ref 0.2–0.9)
MONOS PCT: 7.4 %
NEUTROS ABS: 15.4 10*3/uL — AB (ref 1.4–6.5)
Neutrophil %: 82.9 %

## 2014-10-31 LAB — CBC
HCT: 44.3 %
HGB: 14.4 g/dL
MCH: 30.2 pg
MCHC: 32.5 g/dL
MCV: 93 fL
Platelet: 426 x10 3/mm 3
RBC: 4.77 X10 6/mm 3
RDW: 14.1 %
WBC: 18.6 x10 3/mm 3 — ABNORMAL HIGH

## 2014-10-31 LAB — LIPASE, BLOOD: Lipase: 163 U/L (ref 73–393)

## 2014-10-31 LAB — ETHANOL: Ethanol: <3 mg/dL

## 2014-11-01 ENCOUNTER — Emergency Department (INDEPENDENT_AMBULATORY_CARE_PROVIDER_SITE_OTHER)
Admission: EM | Admit: 2014-11-01 | Discharge: 2014-11-01 | Disposition: A | Discharge status: Home / Self Care | Payer: Self-pay | Source: Home / Self Care | Attending: Emergency Medicine | Admitting: Emergency Medicine

## 2014-11-01 ENCOUNTER — Encounter (HOSPITAL_COMMUNITY): Payer: Self-pay | Admitting: *Deleted

## 2014-11-01 DIAGNOSIS — S025XXA Fracture of tooth (traumatic), initial encounter for closed fracture: Secondary | ICD-10-CM

## 2014-11-01 MED ORDER — HYDROCODONE-ACETAMINOPHEN 5-325 MG PO TABS: 1.0000 | ORAL_TABLET | Freq: Four times a day (QID) | ORAL | Status: DC | PRN

## 2014-11-01 MED ORDER — PENICILLIN V POTASSIUM 500 MG PO TABS: 500.0000 mg | ORAL_TABLET | Freq: Four times a day (QID) | ORAL | Status: AC

## 2014-11-01 NOTE — ED Provider Notes (Signed)
CSN: [id number]     Arrival date & time 11/01/14  47 History   First MD Initiated Contact with Patient 11/01/14 1826     Chief Complaint  Patient presents with  . Dental Problem   (Consider location/radiation/quality/duration/timing/severity/associated sxs/prior Treatment) HPI She is a 33 year old woman here for evaluation of tooth pain. She states this tooth, the right upper molar, has been causing her problems for the last month or so. A portion of it broke off a month ago at which time she was treated for an infection. The rest of the tooth fell out over the weekend, and she has had significant pain since then. She has tried ibuprofen and over-the-counter putty with minimal improvement.  She denies any fevers or chills. She has spoken to her dentist and is planning on getting the tooth pulled next week.  Past Medical History  Diagnosis Date  . IBS (irritable bowel syndrome)    Past Surgical History  Procedure Laterality Date  . Breast reduction surgery  2005   History reviewed. No pertinent family history. History  Substance Use Topics  . Smoking status: Former Smoker -- 0.50 packs/day    Types: Cigarettes    Quit date: 09/06/2014  . Smokeless tobacco: Not on file  . Alcohol Use: No   OB History    No data available     Review of Systems  Constitutional: Negative for fever and chills.  HENT: Positive for dental problem.     Allergies  Mold extract  Home Medications   Prior to Admission medications   Medication Sig Start Date End Date Taking? Authorizing Provider  ibuprofen (ADVIL,MOTRIN) 100 MG tablet Take 400 mg by mouth every 6 (six) hours as needed for pain or fever (tooth pain).    Yes Historical Provider, MD  Multiple Vitamin (MULTIVITAMIN WITH MINERALS) TABS tablet Take 1 tablet by mouth daily.   Yes Historical Provider, MD  doxycycline (VIBRAMYCIN) 100 MG capsule Take 1 capsule (100 mg total) by mouth 2 (two) times daily. One po bid x 7 days 10/06/14    Patty Sermons Camprubi-Soms, PA-C  HYDROcodone-acetaminophen (NORCO) 5-325 MG per tablet Take 1-2 tablets by mouth every 6 (six) hours as needed for severe pain. 11/01/14   Melony Overly, MD  naproxen (NAPROSYN) 500 MG tablet Take 1 tablet (500 mg total) by mouth 2 (two) times daily as needed for mild pain, moderate pain or headache (TAKE WITH MEALS.). 10/06/14   Mercedes Strupp Camprubi-Soms, PA-C  penicillin v potassium (VEETID) 500 MG tablet Take 1 tablet (500 mg total) by mouth 4 (four) times daily. 11/01/14 11/08/14  Melony Overly, MD   BP 105/72 mmHg  Pulse 79  Temp(Src) 98.3 F (36.8 C) (Oral)  Resp 20  SpO2 100%  LMP 10/25/2014 Physical Exam  Constitutional: She is oriented to person, place, and time. She appears well-developed and well-nourished. She appears distressed (mild).  HENT:  Mouth/Throat:    No jaw swelling.  Cardiovascular: Normal rate.   Pulmonary/Chest: Effort normal.  Neurological: She is alert and oriented to person, place, and time.    ED Course  Procedures (including critical care time) Labs Review Labs Reviewed - No data to display  Imaging Review No results found.   MDM   1. Broken tooth, closed, initial encounter    Penicillin 4 times a day for 10 days. Norco as needed for severe pain. Ibuprofen 600 mg 4 times a day. Follow-up with dentist next week for tooth extraction.    Junie Panning  Earlene Plater, MD 11/01/14 [phone]

## 2014-11-01 NOTE — Discharge Instructions (Signed)
Take Penicillin 1 pill 4 times a day until gone. Take  Ibuprofen 600mg  every 6 hours. Use the Norco every 6 hours as needed for severe pain.  Follow up with your dentist as soon as possible.

## 2014-11-01 NOTE — ED Notes (Signed)
Tooth started to crumble and pain in R upper molar a month ago.  She was given Doxycycline and it got better.  Tooth broke off Sat. Night.  She put OTC putty in it to cover the nerve but it did not help.  She said it is infected again.

## 2014-11-21 ENCOUNTER — Emergency Department (INDEPENDENT_AMBULATORY_CARE_PROVIDER_SITE_OTHER)
Admission: EM | Admit: 2014-11-21 | Discharge: 2014-11-21 | Disposition: A | Discharge status: Home / Self Care | Payer: Self-pay | Source: Home / Self Care | Attending: Emergency Medicine | Admitting: Emergency Medicine

## 2014-11-21 ENCOUNTER — Encounter (HOSPITAL_COMMUNITY): Payer: Self-pay | Admitting: Emergency Medicine

## 2014-11-21 DIAGNOSIS — K040 Pulpitis: Secondary | ICD-10-CM

## 2014-11-21 DIAGNOSIS — K0401 Reversible pulpitis: Secondary | ICD-10-CM

## 2014-11-21 MED ORDER — OXYCODONE-ACETAMINOPHEN 5-325 MG PO TABS: ORAL_TABLET | ORAL | Status: DC

## 2014-11-21 MED ORDER — CLINDAMYCIN HCL 300 MG PO CAPS: 300.0000 mg | ORAL_CAPSULE | Freq: Four times a day (QID) | ORAL | Status: DC

## 2014-11-21 MED ORDER — DICLOFENAC SODIUM 75 MG PO TBEC: 75.0000 mg | DELAYED_RELEASE_TABLET | Freq: Two times a day (BID) | ORAL | Status: DC

## 2014-11-21 NOTE — Discharge Instructions (Signed)
Look up the [employer]'s [facility] for free dental clinics. Http://www.ncdental.org/ncds/Schedule.asp ° °Get there early and be prepared to wait. [employer] and [employer] have dental hygienist schools that provide low cost routine dental care.  ° °Other resources: °[facility] °[address] °[place], [place] °[phone] ° °Patients with Medicaid: °[facility]                     [facility] °[address]                                [address] °Phone:  [phone]                                                  Phone:  [phone] ° °Dr. Janice Civils °[address] °[phone] ° °If unable to pay or uninsured, contact:  [facility] or [facility] to become qualified for the adult dental clinic. ° °No matter what dental problem you have, it will not get better unless you get good dental care.  If the tooth is not taken care of, your symptoms will come back in time and you will be visiting us again in the Urgent Care Center with a bad toothache.  So, see your dentist as soon as possible.  If you don't have a dentist, we can give you a list of dentists.  Sometimes the most cost effective treatment is removal of the tooth.  This can be done very inexpensively through one of the low cost [facility] Dent[facility] Centers such as the facility on [address] in [place] ([phone]).  The downside to this is that you will have one less tooth and this can effect your ability to chew. ° °Some other things that can be done for a dental infection include the following: ° °· Rinse your mouth out with hot salt water (1/2 tsp of table salt and a pinch of baking soda in 8 oz of hot water).  You can do this every 2 or 3 hours. °· Avoid cold foods, beverages, and cold air.  This will make your symptoms worse. °· Sleep with your head elevated.  Sleeping flat will cause your gums and oral tissues to swell and make them hurt  more.  You can sleep on several pillows.  Even better is to sleep in a recliner with your head higher than your heart. °· For mild to moderate pain, you can take Tylenol, ibuprofen, or Aleve. °· External application of heat by a heating pad, hot water bottle, or hot wet towel can help with pain and speed healing.  You can do this every 2 to 3 hours. Do not fall asleep on a heating pad since this can cause a burn.  °·  °

## 2014-11-21 NOTE — ED Provider Notes (Signed)
  Chief Complaint   No chief complaint on file.   History of Present Illness   Taylor Wiggins is a 33 year old female who has had problems with her right, upper first molar. This is been crumbling for about a month. She is seeing her dentist for this and took a round of Augmentin. Over the weekend the top of the tooth just cracked off completely. Is very painful. Hurts to chew on that side. No swelling of the gingiva or the cheek. She denies any swelling of the floor the mouth hasn't had no difficulty swallowing or breathing. She denies fever, headache, facial pain, neck pain, swelling, chest pain, or shortness of breath.  Review of Systems   Other than as noted above, the patient denies any of the following symptoms: Systemic:  No fever or chills. ENT:  No headache, ear ache, sore throat, nasal congestion, facial pain, or swelling. Neck:  No adenopathy or neck swelling. Lungs:  No coughing or shortness of breath.  [place]   Past medical history, family history, social history, meds, and allergies were reviewed.   Physical Examination     Vital signs:  BP 115/77 mmHg  Pulse 100  Temp(Src) 98.3 F (36.8 C) (Oral)  Resp 16  SpO2 99%  LMP 10/25/2014 General:  Alert, oriented, in no distress. ENT:  TMs and canals normal.  Nasal mucosa normal. Mouth exam:  The entire top of the right, upper first molar has completely cracked off. The pulp is exposed and is decayed. There is no swelling of the gingiva, no collection of pus, the pharynx is clear, no swelling of the tongue or the floor the mouth. Neck:  No swelling or adenopathy. Lungs:  Breath sounds clear and equal bilaterally.  No wheezes, rales or rhonchi. Heart:  Regular rhythm.  No gallops or murmers. Skin:  Clear, warm and dry.   Assessment   The encounter diagnosis was Pulpitis.  No evidence of Ludwig's angina.    Plan   1.  Meds:  The following meds were prescribed:   Discharge Medication List as of 11/21/2014 10:08 AM     START taking these medications   Details  clindamycin (CLEOCIN) 300 MG capsule Take 1 capsule (300 mg total) by mouth 4 (four) times daily., Starting 11/21/2014, Until Discontinued, Print    diclofenac (VOLTAREN) 75 MG EC tablet Take 1 tablet (75 mg total) by mouth 2 (two) times daily., Starting 11/21/2014, Until Discontinued, Print    oxyCODONE-acetaminophen (PERCOCET) 5-325 MG per tablet 1 to 2 tablets every 6 hours as needed for pain., Print        2.  Patient Education/Counseling:  The patient was given appropriate handouts, self care instructions, and instructed in pain control. Suggested sleeping with head of bed elevated and hot salt water mouthwash.   3.  Follow up:  The patient was told to follow up if no better in 3 to 4 days, if becoming worse in any way, and given some red flag symptoms such as difficulty swallowing or breathing which would prompt immediate return.  Follow up with a dentist as soon as posssible.     Harden Mo, MD 11/21/14 [phone]

## 2014-11-24 ENCOUNTER — Emergency Department (HOSPITAL_COMMUNITY)
Admission: EM | Admit: 2014-11-24 | Discharge: 2014-11-24 | Disposition: A | Discharge status: Home / Self Care | Payer: Self-pay | Attending: Emergency Medicine | Admitting: Emergency Medicine

## 2014-11-24 ENCOUNTER — Encounter (HOSPITAL_COMMUNITY): Payer: Self-pay | Admitting: *Deleted

## 2014-11-24 DIAGNOSIS — Z79899 Other long term (current) drug therapy: Secondary | ICD-10-CM | POA: Insufficient documentation

## 2014-11-24 DIAGNOSIS — Z87891 Personal history of nicotine dependence: Secondary | ICD-10-CM | POA: Insufficient documentation

## 2014-11-24 DIAGNOSIS — K047 Periapical abscess without sinus: Secondary | ICD-10-CM | POA: Insufficient documentation

## 2014-11-24 DIAGNOSIS — Z791 Long term (current) use of non-steroidal anti-inflammatories (NSAID): Secondary | ICD-10-CM | POA: Insufficient documentation

## 2014-11-24 DIAGNOSIS — Z792 Long term (current) use of antibiotics: Secondary | ICD-10-CM | POA: Insufficient documentation

## 2014-11-24 DIAGNOSIS — Z8719 Personal history of other diseases of the digestive system: Secondary | ICD-10-CM | POA: Insufficient documentation

## 2014-11-24 DIAGNOSIS — K029 Dental caries, unspecified: Secondary | ICD-10-CM | POA: Insufficient documentation

## 2014-11-24 MED ORDER — OXYCODONE-ACETAMINOPHEN 5-325 MG PO TABS: 2.0000 | ORAL_TABLET | ORAL | Status: DC | PRN

## 2014-11-24 MED ORDER — CLINDAMYCIN HCL 150 MG PO CAPS
ORAL_CAPSULE | ORAL | Status: AC
  Administered 2014-11-24: 16:00:00
  Filled 2014-11-24: qty 3

## 2014-11-24 MED ORDER — CLINDAMYCIN HCL 150 MG PO CAPS: 150.0000 mg | ORAL_CAPSULE | Freq: Four times a day (QID) | ORAL | Status: DC

## 2014-11-24 MED ORDER — CLINDAMYCIN HCL 150 MG PO CAPS
300.0000 mg | ORAL_CAPSULE | Freq: Once | ORAL | Status: AC
  Administered 2014-11-24: 300 mg via ORAL
  Filled 2014-11-24: qty 2

## 2014-11-24 NOTE — ED Provider Notes (Signed)
CSN: [id number]     Arrival date & time 11/24/14  1525 History   First MD Initiated Contact with Patient 11/24/14 1546     Chief Complaint  Patient presents with  . Dental Pain     (Consider location/radiation/quality/duration/timing/severity/associated sxs/prior Treatment) Patient is a 33 y.o. female presenting with tooth pain. The history is provided by the patient.  Dental Pain Location:  Upper Upper teeth location:  3/RU 1st molar Quality:  Throbbing Severity:  Moderate Onset quality:  Gradual  Taylor Wiggins is a 33 y.o. female who presents to the ED with dental pain and abscess. She was evaluated in the Urgent Care 11/23 for same. She came to [place] to stay with her mom for a couple days and forgot her medication. She is here today with increased pain and drainage from the tooth. She has had a low grade fever. She has not taken her medication in 2 days.   Past Medical History  Diagnosis Date  . IBS (irritable bowel syndrome)    Past Surgical History  Procedure Laterality Date  . Breast reduction surgery  2005   History reviewed. No pertinent family history. History  Substance Use Topics  . Smoking status: Former Smoker -- 0.50 packs/day    Types: Cigarettes    Quit date: 09/06/2014  . Smokeless tobacco: Not on file  . Alcohol Use: No   OB History    No data available     Review of Systems Negative except as stated in HPI   Allergies  Mold extract  Home Medications   Prior to Admission medications   Medication Sig Start Date End Date Taking? Authorizing Provider  clindamycin (CLEOCIN) 300 MG capsule Take 1 capsule (300 mg total) by mouth 4 (four) times daily. 11/21/14   Harden Mo, MD  diclofenac (VOLTAREN) 75 MG EC tablet Take 1 tablet (75 mg total) by mouth 2 (two) times daily. 11/21/14   Harden Mo, MD  doxycycline (VIBRAMYCIN) 100 MG capsule Take 1 capsule (100 mg total) by mouth 2 (two) times daily. One po bid x 7 days 10/06/14   Patty Sermons Camprubi-Soms, PA-C  HYDROcodone-acetaminophen (NORCO) 5-325 MG per tablet Take 1-2 tablets by mouth every 6 (six) hours as needed for severe pain. 11/01/14   Melony Overly, MD  ibuprofen (ADVIL,MOTRIN) 100 MG tablet Take 400 mg by mouth every 6 (six) hours as needed for pain or fever (tooth pain).     Historical Provider, MD  Multiple Vitamin (MULTIVITAMIN WITH MINERALS) TABS tablet Take 1 tablet by mouth daily.    Historical Provider, MD  naproxen (NAPROSYN) 500 MG tablet Take 1 tablet (500 mg total) by mouth 2 (two) times daily as needed for mild pain, moderate pain or headache (TAKE WITH MEALS.). 10/06/14   Mercedes Strupp Camprubi-Soms, PA-C  oxyCODONE-acetaminophen (PERCOCET) 5-325 MG per tablet 1 to 2 tablets every 6 hours as needed for pain. 11/21/14   Harden Mo, MD   BP 105/79 mmHg  Pulse 106  Temp(Src) 99.5 F (37.5 C) (Oral)  Resp 18  Ht 5\' 7"  (1.702 m)  Wt 140 lb (63.504 kg)  BMI 21.92 kg/m2  SpO2 100%  LMP 11/23/2014 Physical Exam  Constitutional: She is oriented to person, place, and time. She appears well-developed and well-nourished. No distress.  HENT:  Head: Normocephalic and atraumatic.  Nose: Nose normal.  Mouth/Throat: Uvula is midline, oropharynx is clear and moist and mucous membranes are normal. Dental abscesses and dental caries present.  First molar lower right decayed into the gumline, swelling and erythema of the gum surrounding the tooth. Purulent drainage.   Eyes: EOM are normal.  Neck: Normal range of motion. Neck supple.  Cardiovascular: Normal rate and regular rhythm.   Pulmonary/Chest: Effort normal. She has no wheezes. She has no rales.  Abdominal: Soft. Bowel sounds are normal. There is no tenderness.  Musculoskeletal: Normal range of motion.  Neurological: She is alert and oriented to person, place, and time. She has normal strength. No cranial nerve deficit or sensory deficit. Gait normal.  Reflex Scores:      Bicep reflexes are 2+ on  the right side and 2+ on the left side.      Brachioradialis reflexes are 2+ on the right side and 2+ on the left side.      Patellar reflexes are 2+ on the right side and 2+ on the left side.      Achilles reflexes are 2+ on the right side and 2+ on the left side. Skin: Skin is warm and dry.  Psychiatric: She has a normal mood and affect. Her behavior is normal.  Nursing note and vitals reviewed.   ED Course  Procedures   MDM  33 y.o. female with dental pain due to caries and abscess. Will give her 24 hours of her antibiotic and pain medication until she can return home for her Rx. Discussed with the patient and all questioned fully answered. She will follow up with a dentist as soon as possible.     [place], NP 11/24/14 2426  Janice Norrie, MD 11/24/14 2352

## 2014-11-24 NOTE — ED Notes (Signed)
Rt upper molar pain, tooth is broken and pt says "draining pus"

## 2014-11-24 NOTE — Discharge Instructions (Signed)
We are giving you medication to take for the next 24 hours since you left your medication at home. You will need to finish the medications as directed.  Dental Abscess A dental abscess is a collection of infected fluid (pus) from a bacterial infection in the inner part of the tooth (pulp). It usually occurs at the end of the tooth's root.  CAUSES   Severe tooth decay.  Trauma to the tooth that allows bacteria to enter into the pulp, such as a broken or chipped tooth. SYMPTOMS   Severe pain in and around the infected tooth.  Swelling and redness around the abscessed tooth or in the mouth or face.  Tenderness.  Pus drainage.  Bad breath.  Bitter taste in the mouth.  Difficulty swallowing.  Difficulty opening the mouth.  Nausea.  Vomiting.  Chills.  Swollen neck glands. DIAGNOSIS   A medical and dental history will be taken.  An examination will be performed by tapping on the abscessed tooth.  X-rays may be taken of the tooth to identify the abscess. TREATMENT The goal of treatment is to eliminate the infection. You may be prescribed antibiotic medicine to stop the infection from spreading. A root canal may be performed to save the tooth. If the tooth cannot be saved, it may be pulled (extracted) and the abscess may be drained.  HOME CARE INSTRUCTIONS  Only take over-the-counter or prescription medicines for pain, fever, or discomfort as directed by your caregiver.  Rinse your mouth (gargle) often with salt water ( tsp salt in 8 oz [250 ml] of warm water) to relieve pain or swelling.  Do not drive after taking pain medicine (narcotics).  Do not apply heat to the outside of your face.  Return to your dentist for further treatment as directed. SEEK MEDICAL CARE IF:  Your pain is not helped by medicine.  Your pain is getting worse instead of better. SEEK IMMEDIATE MEDICAL CARE IF:  You have a fever or persistent symptoms for more than 2-3 days.  You have a  fever and your symptoms suddenly get worse.  You have chills or a very bad headache.  You have problems breathing or swallowing.  You have trouble opening your mouth.  You have swelling in the neck or around the eye. Document Released: 12/16/2005 Document Revised: 09/09/2012 Document Reviewed: 03/26/2011 [facility] Patient Information 2015 [place], [place]. This information is not intended to replace advice given to you by your health care provider. Make sure you discuss any questions you have with your health care provider. Continue to take ibuprofen in addition to the other medications. Follow up with a dentist as soon as possible.

## 2014-11-25 DIAGNOSIS — F319 Bipolar disorder, unspecified: Secondary | ICD-10-CM | POA: Insufficient documentation

## 2014-11-25 DIAGNOSIS — Z765 Malingerer [conscious simulation]: Secondary | ICD-10-CM | POA: Insufficient documentation

## 2014-12-02 MED FILL — Oxycodone w/ Acetaminophen Tab 5-325 MG: ORAL | Qty: 6 | Status: AC

## 2015-04-28 ENCOUNTER — Emergency Department
Admit: 2015-04-28 | Disposition: A | Discharge status: Home / Self Care | Payer: Self-pay | Admitting: Emergency Medicine

## 2015-04-28 LAB — CBC
HCT: 41 % (ref 35.0–47.0)
HGB: 13.6 g/dL (ref 12.0–16.0)
MCH: 30.9 pg (ref 26.0–34.0)
MCHC: 33.2 g/dL (ref 32.0–36.0)
MCV: 93 fL (ref 80–100)
PLATELETS: 430 10*3/uL (ref 150–440)
RBC: 4.4 10*6/uL (ref 3.80–5.20)
RDW: 13.9 % (ref 11.5–14.5)
WBC: 10.8 10*3/uL (ref 3.6–11.0)

## 2015-04-28 LAB — BASIC METABOLIC PANEL
Anion Gap: 9 (ref 7–16)
BUN: 14 mg/dL
CALCIUM: 9.5 mg/dL
CREATININE: 0.69 mg/dL
Chloride: 101 mmol/L
Co2: 29 mmol/L
EGFR (African American): >60
GLUCOSE: 117 mg/dL — AB
Potassium: 3.5 mmol/L
Sodium: 139 mmol/L

## 2015-04-28 LAB — PREGNANCY, URINE: Pregnancy Test, Urine: NEGATIVE m[IU]/mL

## 2015-04-28 LAB — URINALYSIS, COMPLETE
BILIRUBIN, UR: NEGATIVE
Blood: NEGATIVE
Glucose,UR: NEGATIVE mg/dL (ref 0–75)
Ketone: NEGATIVE
Leukocyte Esterase: NEGATIVE
Nitrite: NEGATIVE
PROTEIN: NEGATIVE
Ph: 7 (ref 4.5–8.0)
RBC, UR: NONE SEEN /HPF (ref 0–5)
Specific Gravity: 1.005 (ref 1.003–1.030)

## 2015-05-01 LAB — URINE CULTURE

## 2015-09-12 ENCOUNTER — Telehealth: Payer: Self-pay | Admitting: *Deleted

## 2015-09-12 NOTE — Telephone Encounter (Signed)
Call from pt came to my phone concerning a mammogram needed and pt was given this number and told to ask for Tammy. Pt does not have any doctors or insurance. I redirected her to [facility] to get this arranged. I told pt if she has any problems to call this nurse back @ [phone].

## 2015-09-13 ENCOUNTER — Emergency Department (HOSPITAL_COMMUNITY)
Admission: EM | Admit: 2015-09-13 | Discharge: 2015-09-13 | Discharge status: Against Medical Advice | Payer: Self-pay | Attending: Emergency Medicine | Admitting: Emergency Medicine

## 2015-09-13 ENCOUNTER — Encounter (HOSPITAL_COMMUNITY): Payer: Self-pay | Admitting: Emergency Medicine

## 2015-09-13 DIAGNOSIS — Z5321 Procedure and treatment not carried out due to patient leaving prior to being seen by health care provider: Secondary | ICD-10-CM | POA: Insufficient documentation

## 2015-09-13 DIAGNOSIS — R1110 Vomiting, unspecified: Secondary | ICD-10-CM | POA: Insufficient documentation

## 2015-09-13 NOTE — ED Notes (Signed)
Pt c/o HA and emesis x1 day, palpitations x2 weeks.

## 2015-09-13 NOTE — ED Notes (Signed)
At this time she asks me to check her temperature, which I do.  Upon seeing her temperature is 99.5 she tells me "I'm feeling better, just let the doctor know I'm leaving--I don't want to waste anyone's time".  I inform her that we will be only to happy to see if at any time she changes her mind.  She ambulates without difficulty as she leaves our department.

## 2015-09-14 NOTE — ED Provider Notes (Signed)
Pt LWBS, I did not evaluate.  Leo Grosser, MD 09/14/15 0157

## 2015-09-15 ENCOUNTER — Encounter (HOSPITAL_COMMUNITY): Payer: Self-pay | Admitting: Emergency Medicine

## 2015-09-15 ENCOUNTER — Emergency Department (HOSPITAL_COMMUNITY): Payer: Self-pay

## 2015-09-15 ENCOUNTER — Emergency Department (HOSPITAL_COMMUNITY)
Admission: EM | Admit: 2015-09-15 | Discharge: 2015-09-15 | Disposition: A | Discharge status: Home / Self Care | Payer: Self-pay | Attending: Emergency Medicine | Admitting: Emergency Medicine

## 2015-09-15 DIAGNOSIS — R002 Palpitations: Secondary | ICD-10-CM

## 2015-09-15 DIAGNOSIS — Z87891 Personal history of nicotine dependence: Secondary | ICD-10-CM | POA: Insufficient documentation

## 2015-09-15 DIAGNOSIS — Z8719 Personal history of other diseases of the digestive system: Secondary | ICD-10-CM | POA: Insufficient documentation

## 2015-09-15 DIAGNOSIS — R109 Unspecified abdominal pain: Secondary | ICD-10-CM | POA: Insufficient documentation

## 2015-09-15 LAB — I-STAT TROPONIN, ED: TROPONIN I, POC: 0 ng/mL (ref 0.00–0.08)

## 2015-09-15 LAB — CBC WITH DIFFERENTIAL/PLATELET
Basophils Absolute: 0.1 10*3/uL (ref 0.0–0.1)
Basophils Relative: 1 %
EOS ABS: 0.2 10*3/uL (ref 0.0–0.7)
EOS PCT: 3 %
HCT: 37.6 % (ref 36.0–46.0)
Hemoglobin: 12.6 g/dL (ref 12.0–15.0)
LYMPHS ABS: 3.9 10*3/uL (ref 0.7–4.0)
Lymphocytes Relative: 45 %
MCH: 31.4 pg (ref 26.0–34.0)
MCHC: 33.5 g/dL (ref 30.0–36.0)
MCV: 93.8 fL (ref 78.0–100.0)
MONOS PCT: 9 %
Monocytes Absolute: 0.8 10*3/uL (ref 0.1–1.0)
NEUTROS PCT: 42 %
Neutro Abs: 3.5 10*3/uL (ref 1.7–7.7)
PLATELETS: 397 10*3/uL (ref 150–400)
RBC: 4.01 MIL/uL (ref 3.87–5.11)
RDW: 13.6 % (ref 11.5–15.5)
WBC: 8.5 10*3/uL (ref 4.0–10.5)

## 2015-09-15 LAB — I-STAT CHEM 8, ED
BUN: 16 mg/dL (ref 6–20)
CALCIUM ION: 1.2 mmol/L (ref 1.12–1.23)
Chloride: 103 mmol/L (ref 101–111)
Creatinine, Ser: 0.8 mg/dL (ref 0.44–1.00)
Glucose, Bld: 82 mg/dL (ref 65–99)
HEMATOCRIT: 39 % (ref 36.0–46.0)
HEMOGLOBIN: 13.3 g/dL (ref 12.0–15.0)
Potassium: 3.7 mmol/L (ref 3.5–5.1)
SODIUM: 141 mmol/L (ref 135–145)
TCO2: 27 mmol/L (ref 0–100)

## 2015-09-15 LAB — T4, FREE: Free T4: 1.01 ng/dL (ref 0.61–1.12)

## 2015-09-15 LAB — TSH: TSH: 1.038 u[IU]/mL (ref 0.350–4.500)

## 2015-09-15 NOTE — ED Provider Notes (Signed)
CSN: [id number]     Arrival date & time 09/15/15  0132 History   First MD Initiated Contact with Patient 09/15/15 0211     No chief complaint on file.    (Consider location/radiation/quality/duration/timing/severity/associated sxs/prior Treatment) HPI Comments: Patient states that the past couple weeks.  She's had a feeling of heart palpitations.  She said it will be really fast and then she'll feel a bunk or pounding in her chest and then normal heartbeat.  This is been occurring more more frequently over the past 2 weeks.  She cannot relate it to food, time of day, activity.  She states she does not drink alcohol to excess any longer.  She does not use any street drugs.  Does not have any thyroid problem that she is aware of.  She states she's been evaluated for a renal cyst and some kind of kidney disease but is unsure exactly what they're looking for.  She states her urine has been dark over the past couple weeks as well.  She states she drinks 3-4 32 ounce Gatorade a day.  She does not have any pain when she urinates, but she does state that her kidneys hurt.  The history is provided by the patient.    Past Medical History  Diagnosis Date  . IBS (irritable bowel syndrome)    Past Surgical History  Procedure Laterality Date  . Breast reduction surgery  2005   No family history on file. Social History  Substance Use Topics  . Smoking status: Former Smoker -- 0.50 packs/day    Types: Cigarettes    Quit date: 09/06/2014  . Smokeless tobacco: None  . Alcohol Use: No   OB History    No data available     Review of Systems  Constitutional: Negative for fever and chills.  Respiratory: Negative for cough and shortness of breath.   Cardiovascular: Positive for palpitations. Negative for chest pain and leg swelling.  Gastrointestinal: Negative for nausea and vomiting.  Genitourinary: Positive for flank pain. Negative for dysuria, frequency, hematuria, vaginal bleeding, vaginal  discharge and vaginal pain.  Musculoskeletal: Negative for myalgias.  Skin: Negative for rash.  Neurological: Negative for dizziness and headaches.  All other systems reviewed and are negative.     Allergies  Mold extract  Home Medications   Prior to Admission medications   Medication Sig Start Date End Date Taking? Authorizing Provider  ibuprofen (ADVIL,MOTRIN) 100 MG tablet Take 400 mg by mouth every 6 (six) hours as needed for pain or fever (tooth pain).    Yes Historical Provider, MD  clindamycin (CLEOCIN) 150 MG capsule Take 1 capsule (150 mg total) by mouth every 6 (six) hours. Patient not taking: Reported on 09/15/2015 11/24/14   Ashley Murrain, NP  diclofenac (VOLTAREN) 75 MG EC tablet Take 1 tablet (75 mg total) by mouth 2 (two) times daily. Patient not taking: Reported on 09/15/2015 11/21/14   Harden Mo, MD  doxycycline (VIBRAMYCIN) 100 MG capsule Take 1 capsule (100 mg total) by mouth 2 (two) times daily. One po bid x 7 days Patient not taking: Reported on 09/15/2015 10/06/14   Mercedes Camprubi-Soms, PA-C  HYDROcodone-acetaminophen (NORCO) 5-325 MG per tablet Take 1-2 tablets by mouth every 6 (six) hours as needed for severe pain. Patient not taking: Reported on 09/15/2015 11/01/14   Melony Overly, MD  naproxen (NAPROSYN) 500 MG tablet Take 1 tablet (500 mg total) by mouth 2 (two) times daily as needed for mild pain, moderate pain  or headache (TAKE WITH MEALS.). Patient not taking: Reported on 09/15/2015 10/06/14   Mercedes Camprubi-Soms, PA-C  oxyCODONE-acetaminophen (PERCOCET/ROXICET) 5-325 MG per tablet Take 2 tablets by mouth every 4 (four) hours as needed for moderate pain or severe pain. Patient not taking: Reported on 09/15/2015 11/24/14   Ashley Murrain, NP   BP 106/62 mmHg  Pulse 83  Temp(Src) 98.4 F (36.9 C) (Oral)  Resp 16  SpO2 98%  LMP 09/12/2015 Physical Exam  Constitutional: She is oriented to person, place, and time. She appears well-developed and  well-nourished.  HENT:  Head: Normocephalic.  Eyes: Pupils are equal, round, and reactive to light.  Neck: Normal range of motion.  Cardiovascular: Normal rate and regular rhythm.   Pulmonary/Chest: Effort normal. She has no wheezes. She has no rales.  Abdominal: Soft. She exhibits no distension. There is no tenderness.  Neurological: She is alert and oriented to person, place, and time.  Skin: Skin is warm and dry.    ED Course  Procedures (including critical care time) Labs Review Labs Reviewed  CBC WITH DIFFERENTIAL/PLATELET  TSH  T4, FREE  I-STAT CHEM 8, ED  I-STAT TROPOININ, ED    Imaging Review Dg Chest 2 View  09/15/2015   CLINICAL DATA:  34 year old female with palpitation  EXAM: CHEST  2 VIEW  COMPARISON:  09/03/2013  FINDINGS: The heart size and mediastinal contours are within normal limits. Both lungs are clear. The visualized skeletal structures are unremarkable.  IMPRESSION: No active cardiopulmonary disease.   Electronically Signed   By: Anner Crete M.D.   On: 09/15/2015 03:15   I have personally reviewed and evaluated these images and lab results as part of my medical decision-making.   EKG Interpretation   Date/Time:  Friday September 15 2015 01:41:21 EDT Ventricular Rate:  80 PR Interval:  142 QRS Duration: 87 QT Interval:  381 QTC Calculation: 439 R Axis:   71 Text Interpretation:  Sinus rhythm No significant change since last  tracing Confirmed by Glynn Octave [phone]) on 09/15/2015 5:04:27 AM     patient has not had any palpitations in the emergency department cardiac evaluation is negative.  EKG, chest x-ray, thyroid screen troponin labs all within normal parameters.  She's been referred to cardiology if palpitations continue to worry some to her  MDM   Final diagnoses:  Palpitations         Junius Creamer, NP 09/15/15 0507  Everlene Balls, MD 09/15/15 [phone]

## 2015-09-15 NOTE — ED Notes (Signed)
Pt c/o recurrent heart palpitations, and bilateral flank pain with dark urine.

## 2015-09-15 NOTE — Discharge Instructions (Signed)
Today you were evaluated for palpitations.  No definitive cause has been identified, your cardiac evaluation is normal.  Chest x-ray, normal EKG as well as labs.  Your thyroid screening is normal as well to be given a information sheet on potential causes for palpitations.  Please read this and try to eliminate any T2-weighting factors from your diet.  You've also been given a referral to cardiology for follow-up if these palpitations continue to be concerning to you

## 2016-07-03 DIAGNOSIS — Z1239 Encounter for other screening for malignant neoplasm of breast: Secondary | ICD-10-CM | POA: Insufficient documentation

## 2016-07-23 ENCOUNTER — Emergency Department
Admission: EM | Admit: 2016-07-23 | Discharge: 2016-07-23 | Disposition: A | Discharge status: Home / Self Care | Payer: Self-pay | Attending: Emergency Medicine | Admitting: Emergency Medicine

## 2016-07-23 ENCOUNTER — Encounter: Payer: Self-pay | Admitting: Emergency Medicine

## 2016-07-23 DIAGNOSIS — Z87891 Personal history of nicotine dependence: Secondary | ICD-10-CM | POA: Insufficient documentation

## 2016-07-23 DIAGNOSIS — R569 Unspecified convulsions: Secondary | ICD-10-CM | POA: Insufficient documentation

## 2016-07-23 LAB — CBC
HEMATOCRIT: 39.9 % (ref 35.0–47.0)
Hemoglobin: 13.9 g/dL (ref 12.0–16.0)
MCH: 31.8 pg (ref 26.0–34.0)
MCHC: 34.9 g/dL (ref 32.0–36.0)
MCV: 91.1 fL (ref 80.0–100.0)
Platelets: 364 10*3/uL (ref 150–440)
RBC: 4.37 MIL/uL (ref 3.80–5.20)
RDW: 12.9 % (ref 11.5–14.5)
WBC: 10 10*3/uL (ref 3.6–11.0)

## 2016-07-23 LAB — BASIC METABOLIC PANEL
Anion gap: 8 (ref 5–15)
BUN: 15 mg/dL (ref 6–20)
CALCIUM: 9.1 mg/dL (ref 8.9–10.3)
CO2: 27 mmol/L (ref 22–32)
Chloride: 102 mmol/L (ref 101–111)
Creatinine, Ser: 0.77 mg/dL (ref 0.44–1.00)
GFR calc Af Amer: >60 mL/min (ref >60)
GLUCOSE: 92 mg/dL (ref 65–99)
Potassium: 3.9 mmol/L (ref 3.5–5.1)
Sodium: 137 mmol/L (ref 135–145)

## 2016-07-23 NOTE — Discharge Instructions (Signed)
As we discussed it is very important that you do not drive, go up on roofs, swim, or put yourself in any situation that might be dangerous for you or others if you were to have another seizure until you are cleared by a neurologist. Please seek medical attention for any high fevers, chest pain, shortness of breath, change in behavior, persistent vomiting, bloody stool or any other new or concerning symptoms. ° °

## 2016-07-23 NOTE — ED Triage Notes (Signed)
Pt reports seizure today after taking tramadol; pt reports hx of the same reaction after tramadol. Pt alert and oriented upon arrival, speech clear. Pt denies any pain in head, reports she bit her tongue.

## 2016-07-23 NOTE — ED Provider Notes (Signed)
[facility] Emergency Department Provider Note    ____________________________________________   I have reviewed the triage vital signs and the nursing notes.   HISTORY  Chief Complaint Seizures   History limited by: Not Limited   HPI Taylor Wiggins is a 35 y.o. female who presents to the emergency department today because of concern for seizure. The patient was at work. Coworkers did not discribe the episode to her but stated that it lasted about a minute. The patient did bite her tongue. She was not incontinent of urine. The patient states that she did take a tramadol today. Roughly 2 years ago the patient states she had a seizure. She did take tramadol at that time. Was seen at a hospital and had an MRI and seen by neurologist without any concerning findings. In addition the patient does say she has been under a lot of stress recently, has not been sleeping and had a beer last night.   Past Medical History:  Diagnosis Date  . IBS (irritable bowel syndrome)     Patient Active Problem List   Diagnosis Date Noted  . Alcohol dependence ([place]) 04/28/2013    Past Surgical History:  Procedure Laterality Date  . BREAST REDUCTION SURGERY  2005    Current Outpatient Rx  . Order #: [id number] Class: Print  . Order #: [id number] Class: Print  . Order #: [id number] Class: Print  . Order #: [id number] Class: Print  . Order #: [id number] Class: Historical Med  . Order #: [id number] Class: Print  . Order #: [id number] Class: Print    Allergies Tramadol and Mold extract [trichophyton]  No family history on file.  Social History Social History  Substance Use Topics  . Smoking status: Former Smoker    Packs/day: 0.50    Types: Cigarettes    Quit date: 09/06/2014  . Smokeless tobacco: Not on file  . Alcohol use No    Review of Systems  Constitutional: Negative for fever. Cardiovascular: Negative for chest pain. Respiratory: Negative for shortness of  breath. Gastrointestinal: Negative for abdominal pain, vomiting and diarrhea. Neurological: Negative for headaches, focal weakness or numbness.  10-point ROS otherwise negative.  ____________________________________________   PHYSICAL EXAM:  VITAL SIGNS: ED Triage Vitals [07/23/16 1411]  Enc Vitals Group     BP 118/88     Pulse Rate 86     Resp 16     Temp 97.6 F (36.4 C)     Temp Source Oral     SpO2 98 %     Weight 140 lb (63.5 kg)     Height 5\' 7"  (1.702 m)   Constitutional: Alert and oriented. Well appearing and in no distress. Eyes: Conjunctivae are normal. PERRL. Normal extraocular movements. ENT   Head: Normocephalic and atraumatic.   Nose: No congestion/rhinnorhea.   Mouth/Throat: Mucous membranes are moist. Some swelling to right side of tongue consistent with a bite injury.   Neck: No stridor. Hematological/Lymphatic/Immunilogical: No cervical lymphadenopathy. Cardiovascular: Normal rate, regular rhythm.  No murmurs, rubs, or gallops. Respiratory: Normal respiratory effort without tachypnea nor retractions. Breath sounds are clear and equal bilaterally. No wheezes/rales/rhonchi. Gastrointestinal: Soft and nontender. No distention. There is no CVA tenderness. Genitourinary: Deferred Musculoskeletal: Normal range of motion in all extremities. No joint effusions.  No lower extremity tenderness nor edema. Neurologic:  Normal speech and language. No gross focal neurologic deficits are appreciated.  Skin:  Skin is warm, dry and intact. No rash noted. Psychiatric: Mood and affect are normal. Speech and behavior are normal. Patient  exhibits appropriate insight and judgment.  ____________________________________________    LABS (pertinent positives/negatives)  CBC wnl BMP wnl  ____________________________________________   EKG  None  ____________________________________________     RADIOLOGY  None  ____________________________________________   PROCEDURES  Procedures  ____________________________________________   INITIAL IMPRESSION / ASSESSMENT AND PLAN / ED COURSE  Pertinent labs & imaging results that were available during my care of the patient were reviewed by me and considered in my medical decision making (see chart for details).  Patient presents after apparent seizure like episode. States had a similar episode occur 2 years ago with negative workup. At this point would hesitate to start on antiepileptics given possible drug interaction (tramadol taken both times), stress, lack of sleep and some alcohol use last night. Did discuss seizure precautions including no driving. Will have patient follow up with neurology.   ________________________________________   FINAL CLINICAL IMPRESSION(S) / ED DIAGNOSES  Final diagnoses:  Seizure-like activity ([place])     Note: This dictation was prepared with Dragon dictation. Any transcriptional errors that result from this process are unintentional    Nance Pear, MD 07/23/16 1642

## 2016-07-23 NOTE — ED Notes (Signed)
Pt discharged to home.  Family member driving.  Discharge instructions reviewed.  Verbalized understanding.  No questions or concerns at this time.  Teach back verified.  Pt in NAD.  No items left in ED.   

## 2016-07-23 NOTE — ED Notes (Signed)
Pt presents post seizure this afternoon after taking tramadol for pain from yoga injury. Pt states she feels ok now - no headache or pain. States she had prior seizure after taking tramadol 3 years ago. Mother states seizure lasted about a minute. Denies hitting head or losing bladder/bowel control.

## 2018-02-06 ENCOUNTER — Encounter: Payer: Self-pay | Admitting: Emergency Medicine

## 2018-02-06 ENCOUNTER — Emergency Department: Payer: Self-pay

## 2018-02-06 ENCOUNTER — Emergency Department
Admission: EM | Admit: 2018-02-06 | Discharge: 2018-02-07 | Disposition: A | Discharge status: Home / Self Care | Payer: Self-pay | Attending: Emergency Medicine | Admitting: Emergency Medicine

## 2018-02-06 ENCOUNTER — Other Ambulatory Visit: Payer: Self-pay

## 2018-02-06 DIAGNOSIS — R519 Headache, unspecified: Secondary | ICD-10-CM

## 2018-02-06 DIAGNOSIS — J101 Influenza due to other identified influenza virus with other respiratory manifestations: Secondary | ICD-10-CM | POA: Insufficient documentation

## 2018-02-06 DIAGNOSIS — F419 Anxiety disorder, unspecified: Secondary | ICD-10-CM | POA: Insufficient documentation

## 2018-02-06 DIAGNOSIS — R51 Headache: Secondary | ICD-10-CM

## 2018-02-06 DIAGNOSIS — J45909 Unspecified asthma, uncomplicated: Secondary | ICD-10-CM | POA: Insufficient documentation

## 2018-02-06 DIAGNOSIS — Z87891 Personal history of nicotine dependence: Secondary | ICD-10-CM | POA: Insufficient documentation

## 2018-02-06 DIAGNOSIS — Z79899 Other long term (current) drug therapy: Secondary | ICD-10-CM | POA: Insufficient documentation

## 2018-02-06 HISTORY — DX: Unspecified asthma, uncomplicated: J45.909

## 2018-02-06 LAB — BASIC METABOLIC PANEL
ANION GAP: 10 (ref 5–15)
BUN: 10 mg/dL (ref 6–20)
CALCIUM: 9.4 mg/dL (ref 8.9–10.3)
CO2: 23 mmol/L (ref 22–32)
Chloride: 103 mmol/L (ref 101–111)
Creatinine, Ser: 0.92 mg/dL (ref 0.44–1.00)
GLUCOSE: 109 mg/dL — AB (ref 65–99)
Potassium: 3.4 mmol/L — ABNORMAL LOW (ref 3.5–5.1)
Sodium: 136 mmol/L (ref 135–145)

## 2018-02-06 LAB — TROPONIN I

## 2018-02-06 LAB — CBC
HCT: 37.8 % (ref 35.0–47.0)
HEMOGLOBIN: 13.1 g/dL (ref 12.0–16.0)
MCH: 31.8 pg (ref 26.0–34.0)
MCHC: 34.6 g/dL (ref 32.0–36.0)
MCV: 91.9 fL (ref 80.0–100.0)
Platelets: 303 10*3/uL (ref 150–440)
RBC: 4.11 MIL/uL (ref 3.80–5.20)
RDW: 13.8 % (ref 11.5–14.5)
WBC: 7.2 10*3/uL (ref 3.6–11.0)

## 2018-02-06 NOTE — ED Provider Notes (Signed)
[facility] Emergency Department Provider Note  ____________________________________________   First MD Initiated Contact with Patient 02/06/18 2313     (approximate)  I have reviewed the triage vital signs and the nursing notes.   HISTORY  Chief Complaint Chest Pain and Shortness of Breath    HPI Taylor Wiggins is a 37 y.o. female who presents by private vehicle for evaluation of shortness of breath, chest pain, low-grade fever, general malaise, and body aches.  All of the symptoms started earlier today and are relatively acute in onset.  She describes them as severe.  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  She is concerned that she has "mold all throughout my lungs".  Nothing in particular makes her symptoms better and she describes them as severe.  She says that she has a history of anxiety and as she was getting increasingly concerned she was sick, her anxiety was getting worse.  She was tearful in triage and was obviously anxious when I was talking with her.  She is otherwise generally healthy.  She has no doctor right now and does not have an albuterol inhaler at home.  She recently got insurance but has not yet established a provider.  She denies fever/chills but does endorse generalized myalgias and fatigue/malaise.  She denies sore throat, difficulty swallowing, chest pain except for when she was short of breath and coughing.  She has had a cough that is occasionally productive.  She denies nausea, vomiting, and abdominal pain.  No dysuria.  +Headache, gradual in onset.  Past Medical History:  Diagnosis Date  . Asthma   . IBS (irritable bowel syndrome)     Patient Active Problem List   Diagnosis Date Noted  . Alcohol dependence ([place]) 04/28/2013    Past Surgical History:  Procedure  Laterality Date  . BREAST REDUCTION SURGERY  2005    Prior to Admission medications   Medication Sig Start Date End Date Taking? Authorizing Provider  albuterol (PROVENTIL HFA;VENTOLIN HFA) 108 (90 Base) MCG/ACT inhaler Inhale 2-4 puffs by mouth every 4 hours as needed for wheezing, cough, and/or shortness of breath 02/07/18   Hinda Kehr, MD  clindamycin (CLEOCIN) 150 MG capsule Take 1 capsule (150 mg total) by mouth every 6 (six) hours. Patient not taking: Reported on 09/15/2015 11/24/14   Ashley Murrain, NP  diclofenac (VOLTAREN) 75 MG EC tablet Take 1 tablet (75 mg total) by mouth 2 (two) times daily. Patient not taking: Reported on 09/15/2015 11/21/14   Harden Mo, MD  doxycycline (VIBRAMYCIN) 100 MG capsule Take 1 capsule (100 mg total) by mouth 2 (two) times daily. One po bid x 7 days Patient not taking: Reported on 09/15/2015 10/06/14   Street, [place], PA-C  HYDROcodone-acetaminophen (NORCO) 5-325 MG per tablet Take 1-2 tablets by mouth every 6 (six) hours as needed for severe pain. Patient not taking: Reported on 09/15/2015 11/01/14   Melony Overly, MD  ibuprofen (ADVIL,MOTRIN) 100 MG tablet Take 400 mg by mouth every 6 (six) hours as needed for pain or fever (tooth pain).     [provider]  naproxen (NAPROSYN) 500 MG tablet Take 1 tablet (500 mg total) by mouth 2 (two) times daily as needed for mild pain, moderate pain or headache (TAKE WITH MEALS.). Patient  not taking: Reported on 09/15/2015 10/06/14   Street, [place], PA-C  ondansetron [facility]) 4 MG tablet Take 1-2 tabs by mouth every 8 hours as needed for nausea/vomiting 02/07/18   Hinda Kehr, MD  oxyCODONE-acetaminophen (PERCOCET/ROXICET) 5-325 MG per tablet Take 2 tablets by mouth every 4 (four) hours as needed for moderate pain or severe pain. Patient not taking: Reported on 09/15/2015 11/24/14   Ashley Murrain, NP    Allergies Tramadol and Mold extract [trichophyton]  No family history on file.  Social  History Social History   Tobacco Use  . Smoking status: Former Smoker    Packs/day: 0.50    Types: Cigarettes    Last attempt to quit: 09/06/2014    Years since quitting: 3.4  . Smokeless tobacco: Never Used  Substance Use Topics  . Alcohol use: No  . Drug use: No    Review of Systems Constitutional: No fever/chills.  Myalgias and malaise/fatigue Eyes: No visual changes. ENT: No sore throat. Cardiovascular: chest pain when coughing Respiratory: Mild SOB with cough. Gastrointestinal: No abdominal pain.  No nausea, no vomiting.  No diarrhea.  No constipation. Genitourinary: Negative for dysuria. Musculoskeletal: Negative for neck pain.  Negative for back pain. Integumentary: Negative for rash. Neurological: Headache, gradual in onset.  No focal weakness or numbness. Psychiatric:+anxiety/panic attacks  ____________________________________________   PHYSICAL EXAM:  VITAL SIGNS: ED Triage Vitals  Enc Vitals Group     BP 02/06/18 2021 114/82     Pulse Rate 02/06/18 2021 (!) 108     Resp 02/06/18 2021 16     Temp 02/06/18 2021 (!) 100.7 F (38.2 C)     Temp Source 02/06/18 2021 Oral     SpO2 02/06/18 2021 100 %     Weight 02/06/18 2022 68 kg (150 lb)     Height 02/06/18 2022 1.727 m (5\' 8" )     Head Circumference --      Peak Flow --      Pain Score 02/06/18 2022 8     Pain Loc --      Pain Edu? --      Excl. in [place]? --     Constitutional: Alert and oriented. Well appearing and in no acute distress, but anxious and upset Eyes: Conjunctivae are normal.  Head: Atraumatic. Nose: +congestion/rhinnorhea (mild) Mouth/Throat: Mucous membranes are moist.  Oropharynx non-erythematous. Neck: No stridor.  No meningeal signs.   Cardiovascular: Borderline tachycardia, regular rhythm. Good peripheral circulation. Grossly normal heart sounds. Respiratory: Normal respiratory effort.  No retractions. Lungs CTAB.  Occasional cough. Gastrointestinal: Soft and nontender. No  distention.  Musculoskeletal: No lower extremity tenderness nor edema. No gross deformities of extremities. Neurologic:  Normal speech and language. No gross focal neurologic deficits are appreciated.  Skin:  Skin is warm, dry and intact. No rash noted. Psychiatric: Mood and affect are normal. Speech and behavior are normal.  ____________________________________________   LABS (all labs ordered are listed, but only abnormal results are displayed)  Labs Reviewed  BASIC METABOLIC PANEL - Abnormal; Notable for the following components:      Result Value   Potassium 3.4 (*)    Glucose, Bld 109 (*)    All other components within normal limits  INFLUENZA PANEL BY PCR (TYPE A & B) - Abnormal; Notable for the following components:   Influenza A By PCR POSITIVE (*)    All other components within normal limits  CBC  TROPONIN I   ____________________________________________  EKG  ED ECG REPORT I,  Hinda Kehr, the attending physician, personally viewed and interpreted this ECG.  Date: 02/06/2018 EKG Time: 20:17 Rate: 108 Rhythm: sinus tachycardia QRS Axis: normal Intervals: normal ST/T Wave abnormalities: normal Narrative Interpretation: no evidence of acute ischemia  ____________________________________________  RADIOLOGY I, Hinda Kehr, personally viewed and evaluated these images (plain radiographs) as part of my medical decision making, as well as reviewing the written report by the radiologist.  ED MD interpretation:  No acute infiltrates  Official radiology report(s): Dg Chest 2 View  Result Date: 02/06/2018 CLINICAL DATA:  Chest pain and shortness of breath EXAM: CHEST  2 VIEW COMPARISON:  September 15, 2015 FINDINGS: There is no edema or consolidation. The heart size and pulmonary vascularity are normal. No adenopathy. No pneumothorax. No bone lesions. IMPRESSION: No edema or consolidation. Electronically Signed   By: Lowella Grip III M.D.   On: 02/06/2018 20:57     ____________________________________________   PROCEDURES  Critical Care performed: No   Procedure(s) performed:   Procedures   ____________________________________________   INITIAL IMPRESSION / ASSESSMENT AND PLAN / ED COURSE  As part of my medical decision making, I reviewed the following data within the [place] notes reviewed and incorporated and Radiograph reviewed , EKG reviewed    Differential includes, but is not limited to, viral syndrome, bronchitis including COPD exacerbation, pneumonia, reactive airway disease including asthma, CHF including exacerbation with or without pulmonary/interstitial edema, pneumothorax, ACS, thoracic trauma, and pulmonary embolism.  However in this case I believe the patient most likely has a viral infection and possibly the flu.  I reassured her that at least in the short-term she does not need to worry about mold exposure as she has not immunocompromised and that while cutting back on the exposure is probably beneficial she should not worry about her lungs being filled with mold or fungus.  Her chest x-ray is reassuring as is her EKG. she calmed down considerably after we spent some time talking.  Lab work is reassuring and vital signs are suggestive of a mild viral illness with a slightly elevated temperature and a slightly elevated heart rate.  I am waiting for influenza results but anticipate she will be appropriate for outpatient follow-up.   Clinical Course as of Feb 07 131  Sat Feb 07, 2018  0028 Influenza A By PCR: (!) POSITIVE [CF]  0045 I advised the patient of her positive influenza A results and had my usual/customary discussion with management recommendations and return precautions.  [CF]  0045 Patient is comfortable with the plan to go home and follow up as needed.  [CF]    Clinical Course User Index [CF] Hinda Kehr, MD    ____________________________________________  FINAL CLINICAL  IMPRESSION(S) / ED DIAGNOSES  Final diagnoses:  Acute nonintractable headache, unspecified headache type  Anxiety  Influenza A     MEDICATIONS GIVEN DURING THIS VISIT:  Medications  acetaminophen (TYLENOL) tablet 1,000 mg (1,000 mg Oral Given 02/07/18 0007)     ED Discharge Orders        Ordered    albuterol (PROVENTIL HFA;VENTOLIN HFA) 108 (90 Base) MCG/ACT inhaler     02/07/18 0014    ondansetron (ZOFRAN) 4 MG tablet     02/07/18 0046       Note:  This document was prepared using Dragon voice recognition software and may include unintentional dictation errors.    Hinda Kehr, MD 02/07/18 [phone]

## 2018-02-06 NOTE — ED Triage Notes (Signed)
Pt to ED via POV with c/o SOB and CP that started today. Pt hx of asthma, states she has had bronchitis but getting worse. Pt tearful in triage

## 2018-02-07 LAB — INFLUENZA PANEL BY PCR (TYPE A & B)
INFLBPCR: NEGATIVE
Influenza A By PCR: POSITIVE — AB

## 2018-02-07 MED ORDER — ALBUTEROL SULFATE HFA 108 (90 BASE) MCG/ACT IN AERS: INHALATION_SPRAY | RESPIRATORY_TRACT | 1 refills | Status: DC

## 2018-02-07 MED ORDER — ACETAMINOPHEN 500 MG PO TABS
1000.0000 mg | ORAL_TABLET | Freq: Once | ORAL | Status: AC
  Administered 2018-02-07: 1000 mg via ORAL

## 2018-02-07 MED ORDER — ONDANSETRON HCL 4 MG PO TABS: ORAL_TABLET | ORAL | 0 refills | Status: DC

## 2018-02-07 MED ORDER — ACETAMINOPHEN 500 MG PO TABS
ORAL_TABLET | ORAL | Status: AC
  Filled 2018-02-07: qty 2

## 2018-02-07 NOTE — Discharge Instructions (Signed)

## 2018-04-29 ENCOUNTER — Other Ambulatory Visit: Payer: Self-pay

## 2018-04-29 ENCOUNTER — Encounter (HOSPITAL_COMMUNITY): Payer: Self-pay | Admitting: *Deleted

## 2018-04-29 ENCOUNTER — Emergency Department (HOSPITAL_COMMUNITY): Payer: PRIVATE HEALTH INSURANCE

## 2018-04-29 ENCOUNTER — Observation Stay (HOSPITAL_COMMUNITY)
Admission: EM | Admit: 2018-04-29 | Discharge: 2018-04-30 | Disposition: A | Discharge status: Home / Self Care | Payer: PRIVATE HEALTH INSURANCE | Attending: Internal Medicine | Admitting: Internal Medicine

## 2018-04-29 DIAGNOSIS — F419 Anxiety disorder, unspecified: Secondary | ICD-10-CM | POA: Diagnosis present

## 2018-04-29 DIAGNOSIS — Y990 Civilian activity done for income or pay: Secondary | ICD-10-CM | POA: Insufficient documentation

## 2018-04-29 DIAGNOSIS — K589 Irritable bowel syndrome without diarrhea: Secondary | ICD-10-CM | POA: Insufficient documentation

## 2018-04-29 DIAGNOSIS — W5501XS Bitten by cat, sequela: Secondary | ICD-10-CM | POA: Diagnosis not present

## 2018-04-29 DIAGNOSIS — Y9289 Other specified places as the place of occurrence of the external cause: Secondary | ICD-10-CM | POA: Diagnosis not present

## 2018-04-29 DIAGNOSIS — Z79899 Other long term (current) drug therapy: Secondary | ICD-10-CM | POA: Insufficient documentation

## 2018-04-29 DIAGNOSIS — L03113 Cellulitis of right upper limb: Secondary | ICD-10-CM | POA: Diagnosis not present

## 2018-04-29 DIAGNOSIS — J45909 Unspecified asthma, uncomplicated: Secondary | ICD-10-CM | POA: Insufficient documentation

## 2018-04-29 DIAGNOSIS — F1120 Opioid dependence, uncomplicated: Secondary | ICD-10-CM | POA: Diagnosis not present

## 2018-04-29 DIAGNOSIS — Z87891 Personal history of nicotine dependence: Secondary | ICD-10-CM | POA: Diagnosis not present

## 2018-04-29 DIAGNOSIS — W5501XA Bitten by cat, initial encounter: Secondary | ICD-10-CM | POA: Diagnosis not present

## 2018-04-29 DIAGNOSIS — Z885 Allergy status to narcotic agent status: Secondary | ICD-10-CM | POA: Diagnosis not present

## 2018-04-29 DIAGNOSIS — F1121 Opioid dependence, in remission: Secondary | ICD-10-CM

## 2018-04-29 DIAGNOSIS — F1020 Alcohol dependence, uncomplicated: Secondary | ICD-10-CM | POA: Diagnosis not present

## 2018-04-29 DIAGNOSIS — Z7982 Long term (current) use of aspirin: Secondary | ICD-10-CM | POA: Insufficient documentation

## 2018-04-29 DIAGNOSIS — Y93K9 Activity, other involving animal care: Secondary | ICD-10-CM | POA: Insufficient documentation

## 2018-04-29 DIAGNOSIS — Z720 Tobacco use: Secondary | ICD-10-CM | POA: Diagnosis present

## 2018-04-29 LAB — CBC WITH DIFFERENTIAL/PLATELET
BASOS PCT: 0 %
Basophils Absolute: 0 10*3/uL (ref 0.0–0.1)
EOS ABS: 0.2 10*3/uL (ref 0.0–0.7)
EOS PCT: 2 %
HCT: 38.1 % (ref 36.0–46.0)
Hemoglobin: 13 g/dL (ref 12.0–15.0)
Lymphocytes Relative: 21 %
Lymphs Abs: 1.6 10*3/uL (ref 0.7–4.0)
MCH: 31.4 pg (ref 26.0–34.0)
MCHC: 34.1 g/dL (ref 30.0–36.0)
MCV: 92 fL (ref 78.0–100.0)
MONOS PCT: 10 %
Monocytes Absolute: 0.8 10*3/uL (ref 0.1–1.0)
Neutro Abs: 5.3 10*3/uL (ref 1.7–7.7)
Neutrophils Relative %: 67 %
PLATELETS: 347 10*3/uL (ref 150–400)
RBC: 4.14 MIL/uL (ref 3.87–5.11)
RDW: 13.8 % (ref 11.5–15.5)
WBC: 7.9 10*3/uL (ref 4.0–10.5)

## 2018-04-29 LAB — BASIC METABOLIC PANEL
Anion gap: 9 (ref 5–15)
BUN: 9 mg/dL (ref 6–20)
CO2: 27 mmol/L (ref 22–32)
CREATININE: 0.69 mg/dL (ref 0.44–1.00)
Calcium: 9.5 mg/dL (ref 8.9–10.3)
Chloride: 106 mmol/L (ref 101–111)
Glucose, Bld: 106 mg/dL — ABNORMAL HIGH (ref 65–99)
Potassium: 4.1 mmol/L (ref 3.5–5.1)
SODIUM: 142 mmol/L (ref 135–145)

## 2018-04-29 MED ORDER — SODIUM CHLORIDE 0.9 % IV SOLN
3.0000 g | Freq: Four times a day (QID) | INTRAVENOUS | Status: DC
  Administered 2018-04-29 – 2018-04-30 (×3): 3 g via INTRAVENOUS
  Filled 2018-04-29 (×5): qty 3

## 2018-04-29 MED ORDER — KETOROLAC TROMETHAMINE 15 MG/ML IJ SOLN
15.0000 mg | Freq: Four times a day (QID) | INTRAMUSCULAR | Status: DC | PRN
  Administered 2018-04-29: 15 mg via INTRAVENOUS
  Filled 2018-04-29: qty 1

## 2018-04-29 MED ORDER — POLYETHYLENE GLYCOL 3350 17 G PO PACK: 17.0000 g | PACK | Freq: Every day | ORAL | Status: DC | PRN

## 2018-04-29 MED ORDER — ACETAMINOPHEN 650 MG RE SUPP
650.0000 mg | Freq: Four times a day (QID) | RECTAL | Status: DC | PRN
Start: 2018-04-29 — End: 2018-04-30

## 2018-04-29 MED ORDER — ONDANSETRON HCL 4 MG/2ML IJ SOLN: 4.0000 mg | Freq: Four times a day (QID) | INTRAMUSCULAR | Status: DC | PRN

## 2018-04-29 MED ORDER — ACETAMINOPHEN 325 MG PO TABS: 650.0000 mg | ORAL_TABLET | Freq: Four times a day (QID) | ORAL | Status: DC | PRN

## 2018-04-29 MED ORDER — SODIUM CHLORIDE 0.9 % IV SOLN
3.0000 g | Freq: Once | INTRAVENOUS | Status: AC
  Administered 2018-04-29: 3 g via INTRAVENOUS
  Filled 2018-04-29: qty 3

## 2018-04-29 MED ORDER — ALPRAZOLAM 0.25 MG PO TABS: 0.2500 mg | ORAL_TABLET | Freq: Three times a day (TID) | ORAL | Status: DC | PRN

## 2018-04-29 MED ORDER — AMOXICILLIN-POT CLAVULANATE 875-125 MG PO TABS
1.0000 | ORAL_TABLET | Freq: Once | ORAL | Status: AC
  Administered 2018-04-29: 1 via ORAL
  Filled 2018-04-29: qty 1

## 2018-04-29 MED ORDER — ONDANSETRON HCL 4 MG PO TABS: 4.0000 mg | ORAL_TABLET | Freq: Four times a day (QID) | ORAL | Status: DC | PRN

## 2018-04-29 MED ORDER — ENOXAPARIN SODIUM 40 MG/0.4ML ~~LOC~~ SOLN
40.0000 mg | SUBCUTANEOUS | Status: DC
  Administered 2018-04-29: 40 mg via SUBCUTANEOUS
  Filled 2018-04-29: qty 0.4

## 2018-04-29 NOTE — Progress Notes (Signed)
Pharmacy Antibiotic Note  Taylor Wiggins is a 37 y.o. female admitted on 04/29/2018 with cellulitis of R wrist and forearm following a cat bite Pharmacy has been consulted for Unasyn dosing.  Plan:  Unasyn 3g IV q6 hr will cover P multocida common in cat bite  Pharmacy will sign off as renal function stable WNL and need for further adjustment appears unlikely. Recommend narrowing to Augmentin once improved, unless cultures dictate otherwise.    Temp (24hrs), Avg:98.2 F (36.8 C), Min:98.1 F (36.7 C), Max:98.2 F (36.8 C)  Recent Labs  Lab 04/29/18 1346  WBC 7.9  CREATININE 0.69    CrCl cannot be calculated (Unknown ideal weight.).    Allergies  Allergen Reactions  . Tramadol Other (See Comments)    Seizures   . Mold Extract [Trichophyton] Other (See Comments)    Sneezing    Thank you for allowing pharmacy to be a part of this patient's care.  Reuel Boom, PharmD, BCPS [phone] 04/29/2018, 7:20 PM

## 2018-04-29 NOTE — ED Notes (Signed)
ED TO INPATIENT HANDOFF REPORT  Name/Age/Gender Taylor Wiggins 37 y.o. female  Code Status    Code Status Orders  (From admission, onward)        Start     Ordered   04/29/18 1731  Full code  Continuous     04/29/18 1732    Code Status History    Date Active Date Inactive Code Status Order ID Comments User Context   04/25/2013 2152 04/26/2013 0455 Full Code [id number]  Threasa Beards, MD ED      Home/SNF/Other Home  Chief Complaint cat bite / hand swelling   Level of Care/Admitting Diagnosis ED Disposition    ED Disposition Condition [facility] Area: [facility] [[id number]]  Level of Care: Med-Surg [16]  Diagnosis: Cellulitis of right hand [177939]  Admitting Physician: Junius Argyle  Attending Physician: Annita Brod [[id number]]  PT Class (Do Not Modify): Observation [104]  PT Acc Code (Do Not Modify): Observation [10022]       Medical History Past Medical History:  Diagnosis Date  . Asthma   . IBS (irritable bowel syndrome)     Allergies Allergies  Allergen Reactions  . Tramadol Other (See Comments)    Seizures   . Mold Extract [Trichophyton] Other (See Comments)    Sneezing    IV Location/Drains/Wounds Patient Lines/Drains/Airways Status   Active Line/Drains/Airways    Name:   Placement date:   Placement time:   Site:   Days:   Peripheral IV 04/29/18 Left Forearm   04/29/18    1602    Forearm   less than 1          Labs/Imaging Results for orders placed or performed during the hospital encounter of 04/29/18 (from the past 48 hour(s))  CBC with Differential/Platelet     Status: None   Collection Time: 04/29/18  1:46 PM  Result Value Ref Range   WBC 7.9 4.0 - 10.5 K/uL   RBC 4.14 3.87 - 5.11 MIL/uL   Hemoglobin 13.0 12.0 - 15.0 g/dL   HCT 38.1 36.0 - 46.0 %   MCV 92.0 78.0 - 100.0 fL   MCH 31.4 26.0 - 34.0 pg   MCHC 34.1 30.0 - 36.0 g/dL   RDW 13.8 11.5 - 15.5 %   Platelets 347 150 - 400  K/uL   Neutrophils Relative % 67 %   Neutro Abs 5.3 1.7 - 7.7 K/uL   Lymphocytes Relative 21 %   Lymphs Abs 1.6 0.7 - 4.0 K/uL   Monocytes Relative 10 %   Monocytes Absolute 0.8 0.1 - 1.0 K/uL   Eosinophils Relative 2 %   Eosinophils Absolute 0.2 0.0 - 0.7 K/uL   Basophils Relative 0 %   Basophils Absolute 0.0 0.0 - 0.1 K/uL    Comment: Performed at [facility], [place] [address], [place], [place] [address]  Basic metabolic panel     Status: Abnormal   Collection Time: 04/29/18  1:46 PM  Result Value Ref Range   Sodium 142 135 - 145 mmol/L   Potassium 4.1 3.5 - 5.1 mmol/L   Chloride 106 101 - 111 mmol/L   CO2 27 22 - 32 mmol/L   Glucose, Bld 106 (H) 65 - 99 mg/dL   BUN 9 6 - 20 mg/dL   Creatinine, Ser 0.69 0.44 - 1.00 mg/dL   Calcium 9.5 8.9 - 10.3 mg/dL   GFR calc non Af Amer >60 >60 mL/min  GFR calc Af Amer >60 >60 mL/min    Comment: (NOTE) The eGFR has been calculated using the CKD EPI equation. This calculation has not been validated in all clinical situations. eGFR's persistently <60 mL/min signify possible Chronic Kidney Disease.    Anion gap 9 5 - 15    Comment: Performed at [facility], [place] [address], [place], [place] [address]   Dg Wrist Complete Right  Result Date: 04/29/2018 CLINICAL DATA:  Cat bite.  Pain and swelling EXAM: RIGHT WRIST - COMPLETE 3+ VIEW COMPARISON:  None. FINDINGS: Frontal, oblique lateral, and ulnar deviation scaphoid images were obtained. No fracture or dislocation. No appreciable joint space narrowing or erosion. No bony destruction. No radiopaque foreign body or soft tissue air. IMPRESSION: No fracture or dislocation. No radiopaque foreign body. No erosive change or bony destruction. No appreciable arthropathic change. Electronically Signed   By: Lowella Grip III M.D.   On: 04/29/2018 13:57    Pending Labs Unresulted Labs (From admission, onward)   Start     Ordered   05/06/18 0500  Creatinine,  serum  (enoxaparin (LOVENOX)    CrCl >/= 30 ml/min)  Weekly,   R    Comments:  while on enoxaparin therapy    04/29/18 1732   04/30/18 0500  CBC  Tomorrow morning,   R     04/29/18 1732   04/30/18 5859  Basic metabolic panel  Tomorrow morning,   R     04/29/18 1732   04/29/18 1730  HIV antibody (Routine Testing)  Once,   R     04/29/18 1732      Vitals/Pain Today's Vitals   04/29/18 1228 04/29/18 1230 04/29/18 1601  BP: 125/85  116/82  Pulse: 93  90  Resp: 18  18  Temp: 98.1 F (36.7 C)  98.2 F (36.8 C)  TempSrc: Oral  Oral  SpO2: 99%  100%  PainSc:  8  7     Isolation Precautions No active isolations  Medications Medications  enoxaparin (LOVENOX) injection 40 mg (has no administration in time range)  ondansetron (ZOFRAN) tablet 4 mg (has no administration in time range)    Or  ondansetron (ZOFRAN) injection 4 mg (has no administration in time range)  acetaminophen (TYLENOL) tablet 650 mg (has no administration in time range)    Or  acetaminophen (TYLENOL) suppository 650 mg (has no administration in time range)  ketorolac (TORADOL) 15 MG/ML injection 15 mg (has no administration in time range)  polyethylene glycol (MIRALAX / GLYCOLAX) packet 17 g (has no administration in time range)  amoxicillin-clavulanate (AUGMENTIN) 875-125 MG per tablet 1 tablet (1 tablet Oral Given 04/29/18 1404)  Ampicillin-Sulbactam (UNASYN) 3 g in sodium chloride 0.9 % 100 mL IVPB (0 g Intravenous Stopped 04/29/18 1752)    Mobility walks

## 2018-04-29 NOTE — ED Notes (Signed)
Patient has had x-ray and has stepped out side to call her mother. She reported in an early conversation that her mother was having surgery on Friday. This incident has upset her. Patient reports she will be right back but needs to step out a minute. Will give medication when she returns.

## 2018-04-29 NOTE — ED Triage Notes (Addendum)
Pt states a cat bit her right wrist at work yesterday. Pt is a Camera operator. Pt now has swelling and pain to her left hand. Pt is unable to move wrist and has limited ROM in fingers. Pt's employer gave the pt amoxicillin, which she took one pill yesterday.

## 2018-04-29 NOTE — ED Notes (Signed)
Patient is aware of room number and transportation.

## 2018-04-29 NOTE — ED Provider Notes (Signed)
[facility] DEPT Provider Note   CSN: [id number] Arrival date & time: 04/29/18  1212     History   Chief Complaint Chief Complaint  Patient presents with  . Animal Bite    HPI Taylor Wiggins is a 37 y.o. female who presents to the ED for pain and swelling of the right wrist and hand s/p cat bite. The patient reports she works for a [employer], clinical and yesterday a cat bit her right wrist. The vet started her on Amoxil but today when she woke she had severe pain and swelling to the wrist and hand. Difficulty moving finger due to swelling. The cat is up to date on all immunizations.   HPI  Past Medical History:  Diagnosis Date  . Asthma   . IBS (irritable bowel syndrome)     Patient Active Problem List   Diagnosis Date Noted  . Alcohol dependence ([place]) 04/28/2013    Past Surgical History:  Procedure Laterality Date  . BREAST REDUCTION SURGERY  2005     OB History   None      Home Medications    Prior to Admission medications   Medication Sig Start Date End Date Taking? Authorizing Provider  albuterol (PROVENTIL HFA;VENTOLIN HFA) 108 (90 Base) MCG/ACT inhaler Inhale 2-4 puffs by mouth every 4 hours as needed for wheezing, cough, and/or shortness of breath 02/07/18   Hinda Kehr, MD  clindamycin (CLEOCIN) 150 MG capsule Take 1 capsule (150 mg total) by mouth every 6 (six) hours. Patient not taking: Reported on 09/15/2015 11/24/14   Ashley Murrain, NP  diclofenac (VOLTAREN) 75 MG EC tablet Take 1 tablet (75 mg total) by mouth 2 (two) times daily. Patient not taking: Reported on 09/15/2015 11/21/14   Harden Mo, MD  doxycycline (VIBRAMYCIN) 100 MG capsule Take 1 capsule (100 mg total) by mouth 2 (two) times daily. One po bid x 7 days Patient not taking: Reported on 09/15/2015 10/06/14   Street, [place], PA-C  HYDROcodone-acetaminophen (NORCO) 5-325 MG per tablet Take 1-2 tablets by mouth every 6 (six) hours as needed for severe pain. Patient not  taking: Reported on 09/15/2015 11/01/14   Melony Overly, MD  ibuprofen (ADVIL,MOTRIN) 100 MG tablet Take 400 mg by mouth every 6 (six) hours as needed for pain or fever (tooth pain).     [provider]  naproxen (NAPROSYN) 500 MG tablet Take 1 tablet (500 mg total) by mouth 2 (two) times daily as needed for mild pain, moderate pain or headache (TAKE WITH MEALS.). Patient not taking: Reported on 09/15/2015 10/06/14   Street, [place], PA-C  ondansetron [facility]) 4 MG tablet Take 1-2 tabs by mouth every 8 hours as needed for nausea/vomiting 02/07/18   Hinda Kehr, MD  oxyCODONE-acetaminophen (PERCOCET/ROXICET) 5-325 MG per tablet Take 2 tablets by mouth every 4 (four) hours as needed for moderate pain or severe pain. Patient not taking: Reported on 09/15/2015 11/24/14   Ashley Murrain, NP    Family History History reviewed. No pertinent family history.  Social History Social History   Tobacco Use  . Smoking status: Former Smoker    Packs/day: 0.50    Types: Cigarettes    Last attempt to quit: 09/06/2014    Years since quitting: 3.6  . Smokeless tobacco: Never Used  Substance Use Topics  . Alcohol use: No  . Drug use: No    Types: Heroin, Benzodiazepines     Allergies   Tramadol and Mold extract [trichophyton]  Review of Systems Review of Systems  Musculoskeletal: Positive for arthralgias.  Skin: Positive for color change and wound.  All other systems reviewed and are negative.    Physical Exam Updated Vital Signs BP 116/82 (BP Location: Left Arm)   Pulse 90   Temp 98.2 F (36.8 C) (Oral)   Resp 18   LMP 03/30/2018   SpO2 100%   Physical Exam  Constitutional: She appears well-developed and well-nourished. No distress.  HENT:  Head: Normocephalic.  Eyes: EOM are normal.  Neck: Neck supple.  Cardiovascular: Normal rate.  Pulmonary/Chest: Effort normal.  Musculoskeletal:       Right wrist: She exhibits tenderness and swelling. Decreased range of motion: due to  pain and swelling. Lacerations: puncture wounds.  There is erythema noted to the dorsum of the right wrist and the right hand. There is swelling to the wrist and fingers. There are puncture wounds noted to the ulnar aspect of the right wrist and multiple punctures to the dorsum of the wrist. The focal point of tenderness is along the ulnar aspect of the wrist. Pain with extension of the fingers.  Neurological: She is alert.  Psychiatric: She has a normal mood and affect.  Nursing note and vitals reviewed.    ED Treatments / Results  Labs (all labs ordered are listed, but only abnormal results are displayed) Labs Reviewed  BASIC METABOLIC PANEL - Abnormal; Notable for the following components:      Result Value   Glucose, Bld 106 (*)    All other components within normal limits  CBC WITH DIFFERENTIAL/PLATELET   Radiology Dg Wrist Complete Right  Result Date: 04/29/2018 CLINICAL DATA:  Cat bite.  Pain and swelling EXAM: RIGHT WRIST - COMPLETE 3+ VIEW COMPARISON:  None. FINDINGS: Frontal, oblique lateral, and ulnar deviation scaphoid images were obtained. No fracture or dislocation. No appreciable joint space narrowing or erosion. No bony destruction. No radiopaque foreign body or soft tissue air. IMPRESSION: No fracture or dislocation. No radiopaque foreign body. No erosive change or bony destruction. No appreciable arthropathic change. Electronically Signed   By: Lowella Grip III M.D.   On: 04/29/2018 13:57    Procedures Procedures (including critical care time)  Medications Ordered in ED Medications  Ampicillin-Sulbactam (UNASYN) 3 g in sodium chloride 0.9 % 100 mL IVPB (has no administration in time range)  amoxicillin-clavulanate (AUGMENTIN) 875-125 MG per tablet 1 tablet (1 tablet Oral Given 04/29/18 1404)   Dr. Venora Maples in to evaluate the patient. Will start IV antibiotics and admit patient to medicine for cellulitis.   Initial Impression / Assessment and Plan / ED Course  I  have reviewed the triage vital signs and the nursing notes. Consult with hospitalist and he will see patient for admission. Final Clinical Impressions(s) / ED Diagnoses   Final diagnoses:  Cat bite, initial encounter    ED Discharge Orders    None       Debroah Baller [place], [place] 04/29/18 Marlana Latus    Jola Schmidt, MD 04/29/18 [phone]

## 2018-04-29 NOTE — ED Notes (Signed)
Bed: WTR5 Expected date:  Expected time:  Means of arrival:  Comments: 

## 2018-04-29 NOTE — ED Notes (Signed)
Gave report to Judson Roch, RN for room [id number].

## 2018-04-29 NOTE — H&P (Signed)
History and Physical  Taylor Wiggins DPO:[id number] DOB: 29-Jul-1981 DOA: 04/29/2018  Referring physician: Jola Schmidt, ER physician PCP: Patient, No Pcp Per  Outpatient Specialists: None Patient coming from: Home & is able to ambulate without assistance  Chief Complaint: Cat bite  HPI: Taylor Wiggins is a 37 y.o. female with medical history significant for alcohol and opiate abuse, although she is trying to rehabilitate and works at a veterinary clinic where yesterday, 4/30, she was holding a cat who got upset and then scratched and bit her, breaking her skin on her wrist on her right hand.  The veterinarian gave the patient amoxicillin.  Patient's arm became more swollen with erythema spreading up her arm so she came into the emergency room for further evaluation.  In the emergency room, white count stable at 7.9.  No evidence of sepsis.  No fever.  X-rays of her right wrist were unrevealing.  She was given a dose of Unasyn and case discussed with hand surgery.  Because of no urgent need to take her to the operating room, they recommended IV antibiotics and repeat evaluation in 24 hours.  Hospitalist were called for further evaluation.  Review of Systems: Patient seen in the emergency room Pt complains of right arm and wrist pain.  Improved from earlier.  Pt denies any headaches, vision changes, dysphagia, chest pain, palpitations, shortness of breath, wheeze, cough, abdominal pain, hematuria, dysuria, constipation, diarrhea, focal extremity numbness weakness or pain other than described above.  Review of systems are otherwise negative   Past Medical History:  Diagnosis Date  . Asthma   . IBS (irritable bowel syndrome)    Past Surgical History:  Procedure Laterality Date  . BREAST REDUCTION SURGERY  2005    Social History:  reports that she quit smoking about 3 years ago. Her smoking use included cigarettes. She smoked 0.50 packs per day. She has never used smokeless tobacco. She  reports that she drinks alcohol, although she does not drink anywhere near the amounts that she used to.  She no longer abuses opiates.  She is trying to get off of them altogether and is currently on buprenorphine.  Lives at home by herself.  Ambulates without assistance.   Allergies  Allergen Reactions  . Tramadol Other (See Comments)    Seizures   . Mold Extract [Trichophyton] Other (See Comments)    Sneezing    Family history: Mom with arthritis.  No diabetes or hypertension reported  Prior to Admission medications   Medication Sig Start Date End Date Taking? Authorizing Provider  albuterol (PROVENTIL HFA;VENTOLIN HFA) 108 (90 Base) MCG/ACT inhaler Inhale 2-4 puffs by mouth every 4 hours as needed for wheezing, cough, and/or shortness of breath 02/07/18   Hinda Kehr, MD  clindamycin (CLEOCIN) 150 MG capsule Take 1 capsule (150 mg total) by mouth every 6 (six) hours. Patient not taking: Reported on 09/15/2015 11/24/14   Ashley Murrain, NP  diclofenac (VOLTAREN) 75 MG EC tablet Take 1 tablet (75 mg total) by mouth 2 (two) times daily. Patient not taking: Reported on 09/15/2015 11/21/14   Harden Mo, MD  doxycycline (VIBRAMYCIN) 100 MG capsule Take 1 capsule (100 mg total) by mouth 2 (two) times daily. One po bid x 7 days Patient not taking: Reported on 09/15/2015 10/06/14   Street, [place], PA-C  HYDROcodone-acetaminophen (NORCO) 5-325 MG per tablet Take 1-2 tablets by mouth every 6 (six) hours as needed for severe pain. Patient not taking: Reported on 09/15/2015 11/01/14  Melony Overly, MD  ibuprofen (ADVIL,MOTRIN) 100 MG tablet Take 400 mg by mouth every 6 (six) hours as needed for pain or fever (tooth pain).     [provider]  naproxen (NAPROSYN) 500 MG tablet Take 1 tablet (500 mg total) by mouth 2 (two) times daily as needed for mild pain, moderate pain or headache (TAKE WITH MEALS.). Patient not taking: Reported on 09/15/2015 10/06/14   Street, [place], PA-C    ondansetron [facility]) 4 MG tablet Take 1-2 tabs by mouth every 8 hours as needed for nausea/vomiting 02/07/18   Hinda Kehr, MD  oxyCODONE-acetaminophen (PERCOCET/ROXICET) 5-325 MG per tablet Take 2 tablets by mouth every 4 (four) hours as needed for moderate pain or severe pain. Patient not taking: Reported on 09/15/2015 11/24/14   Ashley Murrain, NP    Physical Exam: BP (!) 120/91 (BP Location: Left Arm)   Pulse 86   Temp 98.4 F (36.9 C) (Oral)   Resp 18   LMP 03/30/2018   SpO2 97%   General: Alert and oriented x3, mild distress secondary to feeling anxious Eyes: Sclera nonicteric, extra ocular movements are intact ENT: Normocephalic and atraumatic, mucous membrane's are moist Neck: Supple, no JVD  Cardiovascular: Regular rate and rhythm, S1-S2 Respiratory: Clear to auscultation bilaterally Abdomen: Soft, nontender, nondistended, positive bowel sounds Skin: Right arm at the area of hand near wrist with multiple scratches and some superficial and deeper bites with surrounding erythema and swelling going as far back as her elbow.  Tender.  She is not able to make a fist with her right hand. Musculoskeletal: No clubbing or cyanosis or edema Psychiatric: Appropriate although anxious, no evidence of psychoses Neurologic: No focal deficits          Labs on Admission:  Basic Metabolic Panel: Recent Labs  Lab 04/29/18 1346  NA 142  K 4.1  CL 106  CO2 27  GLUCOSE 106*  BUN 9  CREATININE 0.69  CALCIUM 9.5   Liver Function Tests: No results for input(s): AST, ALT, ALKPHOS, BILITOT, PROT, ALBUMIN in the last 168 hours. No results for input(s): LIPASE, AMYLASE in the last 168 hours. No results for input(s): AMMONIA in the last 168 hours. CBC: Recent Labs  Lab 04/29/18 1346  WBC 7.9  NEUTROABS 5.3  HGB 13.0  HCT 38.1  MCV 92.0  PLT 347   Cardiac Enzymes: No results for input(s): CKTOTAL, CKMB, CKMBINDEX, TROPONINI in the last 168 hours.  BNP (last 3 results) No  results for input(s): BNP in the last 8760 hours.  ProBNP (last 3 results) No results for input(s): PROBNP in the last 8760 hours.  CBG: No results for input(s): GLUCAP in the last 168 hours.  Radiological Exams on Admission: Dg Wrist Complete Right  Result Date: 04/29/2018 CLINICAL DATA:  Cat bite.  Pain and swelling EXAM: RIGHT WRIST - COMPLETE 3+ VIEW COMPARISON:  None. FINDINGS: Frontal, oblique lateral, and ulnar deviation scaphoid images were obtained. No fracture or dislocation. No appreciable joint space narrowing or erosion. No bony destruction. No radiopaque foreign body or soft tissue air. IMPRESSION: No fracture or dislocation. No radiopaque foreign body. No erosive change or bony destruction. No appreciable arthropathic change. Electronically Signed   By: Lowella Grip III M.D.   On: 04/29/2018 13:57    EKG: Not done  Assessment/Plan Present on Admission: . Alcohol dependence [facility]): Patient states that she is trying to quit. Marland Kitchen Opiate addiction [facility]): Patient currently on buprenorphine, interested in getting off of all  narcotics altogether.  We will give her contact info for Suboxone clinic through [facility]. . Cellulitis of right hand: Principal problem.  Given significant swelling (although may in part be due to her underlying cat allergy), will go with IV Unasyn.  If her swelling/erythema is no better or starts to develop worsening pain, hand surgery to be called.  Otherwise, may be able to go home tomorrow on p.o. Augmentin which would cover most bacteria including Pasteurella seen in cat bite . Tobacco abuse: Patient states that she is really trying to quit.  She declined a nicotine patch, says she is down to less than a pack a day. . Anxiety disorder PRN Xanax.  Principal Problem:   Cellulitis of right hand Active Problems:   Alcohol dependence (HCC)   Opiate addiction ([place])   Tobacco abuse   Anxiety disorder   DVT prophylaxis: Lovenox  Code Status: Full  code  Family Communication: Patient declined for me to call family  Disposition Plan: Potential discharge tomorrow if hand swelling improved  Consults called: Case discussed with hand surgery  Admission status: Given potential for discharge tomorrow, placed under observation    Annita Brod MD [facility] Pager [phone]  If 7PM-7AM, please contact night-coverage [email or web address] Password [email or web address]  04/29/2018, 8:00 PM

## 2018-04-30 DIAGNOSIS — L03113 Cellulitis of right upper limb: Secondary | ICD-10-CM | POA: Diagnosis not present

## 2018-04-30 LAB — CBC
HEMATOCRIT: 36.6 % (ref 36.0–46.0)
Hemoglobin: 12.4 g/dL (ref 12.0–15.0)
MCH: 31.6 pg (ref 26.0–34.0)
MCHC: 33.9 g/dL (ref 30.0–36.0)
MCV: 93.1 fL (ref 78.0–100.0)
Platelets: 320 10*3/uL (ref 150–400)
RBC: 3.93 MIL/uL (ref 3.87–5.11)
RDW: 14 % (ref 11.5–15.5)
WBC: 5.3 10*3/uL (ref 4.0–10.5)

## 2018-04-30 LAB — BASIC METABOLIC PANEL
ANION GAP: 8 (ref 5–15)
BUN: 13 mg/dL (ref 6–20)
CALCIUM: 8.8 mg/dL — AB (ref 8.9–10.3)
CO2: 24 mmol/L (ref 22–32)
Chloride: 106 mmol/L (ref 101–111)
Creatinine, Ser: 0.73 mg/dL (ref 0.44–1.00)
GFR calc Af Amer: >60 mL/min (ref >60)
GFR calc non Af Amer: >60 mL/min (ref >60)
Glucose, Bld: 100 mg/dL — ABNORMAL HIGH (ref 65–99)
Potassium: 3.5 mmol/L (ref 3.5–5.1)
Sodium: 138 mmol/L (ref 135–145)

## 2018-04-30 LAB — HIV ANTIBODY (ROUTINE TESTING W REFLEX): HIV Screen 4th Generation wRfx: NONREACTIVE

## 2018-04-30 MED ORDER — AMOXICILLIN-POT CLAVULANATE 875-125 MG PO TABS: 1.0000 | ORAL_TABLET | Freq: Two times a day (BID) | ORAL | 0 refills | Status: AC

## 2018-04-30 NOTE — Discharge Summary (Signed)
Physician Discharge Summary  Taylor Wiggins XLK:[id number] DOB: May 23, 1981 DOA: 04/29/2018  PCP: Patient, No Pcp Per  Admit date: 04/29/2018 Discharge date: 04/30/2018  Time spent: 20 minutes  Recommendations for Outpatient Follow-up:   1. Complete antibiotics 2. Follow up with PCP   Discharge Diagnoses:  Principal Problem:   Cellulitis of right hand Active Problems:   Alcohol dependence ([place])   Opiate addiction ([place])   Tobacco abuse   Anxiety disorder   Discharge Condition: Fair  Diet recommendation: Regular  There were no vitals filed for this visit.  History of present illness:  Taylor Wiggins is a 37 y.o. female with medical history significant for alcohol and opiate abuse, although she is trying to rehabilitate and works at a veterinary clinic where yesterday, 4/30, she was holding a cat who got upset and then scratched and bit her, breaking her skin on her wrist on her right hand.  The veterinarian gave the patient amoxicillin.  Patient's arm became more swollen with erythema spreading up her arm so she came into the emergency room for further evaluation.  In the emergency room, white count stable at 7.9.  No evidence of sepsis.  No fever.  X-rays of her right wrist were unrevealing.  She was given a dose of Unasyn and case discussed with hand surgery.  Because of no urgent need to take her to the operating room, they recommended IV antibiotics and repeat evaluation in 24 hours.  Hospitalist were called for further evaluation.    Hospital Course:  Patient was admitted and started on IV Unasyn. Her hand appears to be improving with no evidence of worsening infection. The swelling has gone down. Patient is therefore being discharged on oral Augmentin to follow-up with PCP. Patient to also follow-up at Suboxone clinic as well as alcohol rehabilitation programs. She has improved and will need to follow with PCP. Patient also has tobacco abuse with counseling given as well as  nicotine patch. She has anxiety disorder but maintain on home regimen. Alprazolam was given when necessary in the hospital.  Procedures: None  Consultations:  Orthopedics in the ER  Discharge Exam: Vitals:   04/29/18 2141 04/30/18 0602  BP: 95/62 92/62  Pulse: 77 77  Resp: 18 14  Temp: 98.1 F (36.7 C) 97.9 F (36.6 C)  SpO2: 99% 95%    General: stable no acute distress Cardiovascular: regular rate and rhythm Respiratory: ood entry bilaterally with no wheeze arthritis crackles Right hand: Swelling warm but improvedover yesterday  Discharge Instructions   Discharge Instructions    Diet - low sodium heart healthy   Complete by:  As directed    Increase activity slowly   Complete by:  As directed      Allergies as of 04/30/2018      Reactions   Tramadol Other (See Comments)   Seizures   Mold Extract [trichophyton] Other (See Comments)   Sneezing      Medication List    TAKE these medications   albuterol 108 (90 Base) MCG/ACT inhaler Commonly known as:  PROVENTIL HFA;VENTOLIN HFA Inhale 2-4 puffs by mouth every 4 hours as needed for wheezing, cough, and/or shortness of breath   amoxicillin-clavulanate 875-125 MG tablet Commonly known as:  AUGMENTIN Take 1 tablet by mouth 2 (two) times daily for 14 days.   amphetamine-dextroamphetamine 20 MG tablet Commonly known as:  ADDERALL Take 20 mg by mouth 2 (two) times daily.   aspirin EC 325 MG tablet Take 325 mg by  mouth every 6 (six) hours as needed for moderate pain.   ibuprofen 100 MG tablet Commonly known as:  ADVIL,MOTRIN Take 400 mg by mouth every 6 (six) hours as needed for pain or fever (tooth pain).   ondansetron 4 MG tablet Commonly known as:  ZOFRAN Take 1-2 tabs by mouth every 8 hours as needed for nausea/vomiting      Allergies  Allergen Reactions  . Tramadol Other (See Comments)    Seizures   . Mold Extract [Trichophyton] Other (See Comments)    Sneezing      The results of  significant diagnostics from this hospitalization (including imaging, microbiology, ancillary and laboratory) are listed below for reference.    Significant Diagnostic Studies: Dg Wrist Complete Right  Result Date: 04/29/2018 CLINICAL DATA:  Cat bite.  Pain and swelling EXAM: RIGHT WRIST - COMPLETE 3+ VIEW COMPARISON:  None. FINDINGS: Frontal, oblique lateral, and ulnar deviation scaphoid images were obtained. No fracture or dislocation. No appreciable joint space narrowing or erosion. No bony destruction. No radiopaque foreign body or soft tissue air. IMPRESSION: No fracture or dislocation. No radiopaque foreign body. No erosive change or bony destruction. No appreciable arthropathic change. Electronically Signed   By: Lowella Grip III M.D.   On: 04/29/2018 13:57    Microbiology: No results found for this or any previous visit (from the past 240 hour(s)).   Labs: Basic Metabolic Panel: Recent Labs  Lab 04/29/18 1346 04/30/18 0452  NA 142 138  K 4.1 3.5  CL 106 106  CO2 27 24  GLUCOSE 106* 100*  BUN 9 13  CREATININE 0.69 0.73  CALCIUM 9.5 8.8*   Liver Function Tests: No results for input(s): AST, ALT, ALKPHOS, BILITOT, PROT, ALBUMIN in the last 168 hours. No results for input(s): LIPASE, AMYLASE in the last 168 hours. No results for input(s): AMMONIA in the last 168 hours. CBC: Recent Labs  Lab 04/29/18 1346 04/30/18 0452  WBC 7.9 5.3  NEUTROABS 5.3  --   HGB 13.0 12.4  HCT 38.1 36.6  MCV 92.0 93.1  PLT 347 320   Cardiac Enzymes: No results for input(s): CKTOTAL, CKMB, CKMBINDEX, TROPONINI in the last 168 hours. BNP: BNP (last 3 results) No results for input(s): BNP in the last 8760 hours.  ProBNP (last 3 results) No results for input(s): PROBNP in the last 8760 hours.  CBG: No results for input(s): GLUCAP in the last 168 hours.     SignedBarbette Merino MD.  [facility] 04/30/2018, 1:15 PM

## 2018-04-30 NOTE — Progress Notes (Signed)
Assessment unchanged. Pt verbalized understanding of dc instructions through teach back including when to call the doctor/or seek medical help, antibiotics to resume, and care to RUE. Understands to keep RUE elevated and alternate cool packs for swelling. Discharged via wc to front entrance via foot per request accompanied by NT.

## 2018-04-30 NOTE — Progress Notes (Signed)
Area resources for opiate use treatment clinics provided in pt's discharge instructions.  Sharren Bridge, MSW, LCSW Clinical Social Work 04/30/2018 [phone] Coverage for [phone]

## 2018-04-30 NOTE — Discharge Instructions (Signed)
[facility] [address] [place], [place] [address] [phone]   [facility] [address]el[address] Bhat [place], [place] [address] [phone]  [facility] [address], [place], [place] [address] [phone]  [facility] [place], [place], [place] [address] [phone]

## 2018-06-10 DIAGNOSIS — F902 Attention-deficit hyperactivity disorder, combined type: Secondary | ICD-10-CM | POA: Insufficient documentation

## 2018-06-10 DIAGNOSIS — F1190 Opioid use, unspecified, uncomplicated: Secondary | ICD-10-CM | POA: Insufficient documentation

## 2019-01-06 ENCOUNTER — Emergency Department
Admission: EM | Admit: 2019-01-06 | Discharge: 2019-01-06 | Disposition: A | Discharge status: Home / Self Care | Payer: BLUE CROSS/BLUE SHIELD | Attending: Emergency Medicine | Admitting: Emergency Medicine

## 2019-01-06 ENCOUNTER — Other Ambulatory Visit: Payer: Self-pay

## 2019-01-06 DIAGNOSIS — J45909 Unspecified asthma, uncomplicated: Secondary | ICD-10-CM | POA: Insufficient documentation

## 2019-01-06 DIAGNOSIS — F419 Anxiety disorder, unspecified: Secondary | ICD-10-CM | POA: Insufficient documentation

## 2019-01-06 DIAGNOSIS — Z87891 Personal history of nicotine dependence: Secondary | ICD-10-CM | POA: Diagnosis not present

## 2019-01-06 DIAGNOSIS — Z79899 Other long term (current) drug therapy: Secondary | ICD-10-CM | POA: Insufficient documentation

## 2019-01-06 DIAGNOSIS — Z008 Encounter for other general examination: Secondary | ICD-10-CM | POA: Insufficient documentation

## 2019-01-06 LAB — COMPREHENSIVE METABOLIC PANEL
ALK PHOS: 41 U/L (ref 38–126)
ALT: 16 U/L (ref 0–44)
ANION GAP: 5 (ref 5–15)
AST: 18 U/L (ref 15–41)
Albumin: 4.7 g/dL (ref 3.5–5.0)
BILIRUBIN TOTAL: 0.6 mg/dL (ref 0.3–1.2)
BUN: 11 mg/dL (ref 6–20)
CALCIUM: 9.7 mg/dL (ref 8.9–10.3)
CO2: 25 mmol/L (ref 22–32)
Chloride: 106 mmol/L (ref 98–111)
Creatinine, Ser: 0.7 mg/dL (ref 0.44–1.00)
GFR calc non Af Amer: >60 mL/min (ref >60)
Glucose, Bld: 115 mg/dL — ABNORMAL HIGH (ref 70–99)
Potassium: 4.6 mmol/L (ref 3.5–5.1)
Sodium: 136 mmol/L (ref 135–145)
TOTAL PROTEIN: 7.6 g/dL (ref 6.5–8.1)

## 2019-01-06 LAB — URINALYSIS, COMPLETE (UACMP) WITH MICROSCOPIC
Bacteria, UA: NONE SEEN
Bilirubin Urine: NEGATIVE
GLUCOSE, UA: NEGATIVE mg/dL
HGB URINE DIPSTICK: NEGATIVE
Ketones, ur: NEGATIVE mg/dL
Leukocytes, UA: NEGATIVE
NITRITE: NEGATIVE
Protein, ur: NEGATIVE mg/dL
SPECIFIC GRAVITY, URINE: 1.01 (ref 1.005–1.030)
pH: 5 (ref 5.0–8.0)

## 2019-01-06 LAB — CBC
HEMATOCRIT: 41.2 % (ref 36.0–46.0)
HEMOGLOBIN: 13.9 g/dL (ref 12.0–15.0)
MCH: 31.6 pg (ref 26.0–34.0)
MCHC: 33.7 g/dL (ref 30.0–36.0)
MCV: 93.6 fL (ref 80.0–100.0)
Platelets: 401 10*3/uL — ABNORMAL HIGH (ref 150–400)
RBC: 4.4 MIL/uL (ref 3.87–5.11)
RDW: 13.1 % (ref 11.5–15.5)
WBC: 10.3 10*3/uL (ref 4.0–10.5)
nRBC: 0 % (ref 0.0–0.2)

## 2019-01-06 LAB — POCT PREGNANCY, URINE: PREG TEST UR: NEGATIVE

## 2019-01-06 LAB — ETHANOL: Alcohol, Ethyl (B): <10 mg/dL (ref <10)

## 2019-01-06 MED ORDER — LORAZEPAM 0.5 MG PO TABS
0.5000 mg | ORAL_TABLET | Freq: Once | ORAL | Status: AC
  Administered 2019-01-06: 0.5 mg via ORAL
  Filled 2019-01-06: qty 1

## 2019-01-06 NOTE — ED Provider Notes (Signed)
[facility] Emergency Department Provider Note  ____________________________________________   I have reviewed the triage vital signs and the nursing notes. Where available I have reviewed prior notes and, if possible and indicated, outside hospital notes.    HISTORY  Chief Complaint Psychiatric Evaluation    HPI Taylor Wiggins is a 38 y.o. female Taylor Wiggins today voluntarily.  She has marital stress, she denies being physically abused she has no SI she has no HI, but she would like "something for her nerves".  She states she will never take an antidepressant.  She denies taking an overdose, she has a safe place to go with her mother's, she is not driving.  She would like me to give her a prescription for "nerve medication".  However she does not want antidepressants.  She denies any plan or thought of any harm to anyone. States that symptoms been going on for several days to weeks, nothing is better nothing makes it worse no other alleviating or aggravating factors no auditory or visual hallucinations   Past Medical History:  Diagnosis Date  . Asthma   . IBS (irritable bowel syndrome)     Patient Active Problem List   Diagnosis Date Noted  . Opiate addiction ([place]) 04/29/2018  . Cellulitis of right hand 04/29/2018  . Tobacco abuse 04/29/2018  . Anxiety disorder 04/29/2018  . Alcohol dependence ([place]) 04/28/2013    Past Surgical History:  Procedure Laterality Date  . BREAST REDUCTION SURGERY  2005    Prior to Admission medications   Medication Sig Start Date End Date Taking? Authorizing Provider  albuterol (PROVENTIL HFA;VENTOLIN HFA) 108 (90 Base) MCG/ACT inhaler Inhale 2-4 puffs by mouth every 4 hours as needed for wheezing, cough, and/or shortness of breath 02/07/18   Hinda Kehr, MD  amphetamine-dextroamphetamine (ADDERALL) 20 MG tablet Take 20 mg by mouth 2 (two) times daily.    [provider]  aspirin EC 325 MG tablet Take 325 mg by mouth  every 6 (six) hours as needed for moderate pain.    [provider]  ibuprofen (ADVIL,MOTRIN) 100 MG tablet Take 400 mg by mouth every 6 (six) hours as needed for pain or fever (tooth pain).     [provider]  ondansetron (ZOFRAN) 4 MG tablet Take 1-2 tabs by mouth every 8 hours as needed for nausea/vomiting 02/07/18   Hinda Kehr, MD    Allergies Tramadol and Mold extract [trichophyton]  No family history on file.  Social History Social History   Tobacco Use  . Smoking status: Former Smoker    Packs/day: 0.50    Types: Cigarettes    Last attempt to quit: 09/06/2014    Years since quitting: 4.3  . Smokeless tobacco: Never Used  Substance Use Topics  . Alcohol use: No  . Drug use: No    Types: Heroin, Benzodiazepines    Review of Systems Constitutional: No fever/chills Eyes: No visual changes. ENT: No sore throat. No stiff neck no neck pain Cardiovascular: Denies chest pain. Respiratory: Denies shortness of breath. Gastrointestinal:   no vomiting.  No diarrhea.  No constipation. Genitourinary: Negative for dysuria. Musculoskeletal: Negative lower extremity swelling Skin: Negative for rash. Neurological: Negative for severe headaches, focal weakness or numbness.   ____________________________________________   PHYSICAL EXAM:  VITAL SIGNS: ED Triage Vitals [01/06/19 1909]  Enc Vitals Group     BP 111/79     Pulse Rate (!) 103     Resp 20     Temp 98.3 F (  36.8 C)     Temp Source Oral     SpO2 100 %     Weight 140 lb (63.5 kg)     Height      Head Circumference      Peak Flow      Pain Score 0     Pain Loc      Pain Edu?      Excl. in [place]?     Constitutional: Alert and oriented. Well appearing and in no acute distress. Eyes: Conjunctivae are normal Head: Atraumatic HEENT: No congestion/rhinnorhea. Mucous membranes are moist.  Oropharynx non-erythematous Neck:   Nontender with no meningismus, no masses, no stridor Cardiovascular: Normal  rate, regular rhythm. Grossly normal heart sounds.  Good peripheral circulation. Respiratory: Normal respiratory effort.  No retractions. Lungs CTAB. Abdominal: Soft and nontender. No distention. No guarding no rebound Back:  There is no focal tenderness or step off.  there is no midline tenderness there are no lesions noted. there is no CVA tenderness Musculoskeletal: No lower extremity tenderness, no upper extremity tenderness. No joint effusions, no DVT signs strong distal pulses no edema Neurologic:  Normal speech and language. No gross focal neurologic deficits are appreciated.  Skin:  Skin is warm, dry and intact. No rash noted. Psychiatric: Mood and affect are normal. Speech and behavior are normal.  ____________________________________________   LABS (all labs ordered are listed, but only abnormal results are displayed)  Labs Reviewed  CBC - Abnormal; Notable for the following components:      Result Value   Platelets 401 (*)    All other components within normal limits  COMPREHENSIVE METABOLIC PANEL - Abnormal; Notable for the following components:   Glucose, Bld 115 (*)    All other components within normal limits  ETHANOL  URINALYSIS, COMPLETE (UACMP) WITH MICROSCOPIC  POCT PREGNANCY, URINE  POC URINE PREG, ED    Pertinent labs  results that were available during my care of the patient were reviewed by me and considered in my medical decision making (see chart for details). ____________________________________________  EKG  I personally interpreted any EKGs ordered by me or triage  ____________________________________________  RADIOLOGY  Pertinent labs & imaging results that were available during my care of the patient were reviewed by me and considered in my medical decision making (see chart for details). If possible, patient and/or family made aware of any abnormal findings.  No results found. ____________________________________________     PROCEDURES  Procedure(s) performed: None  Procedures  Critical Care performed: None  ____________________________________________   INITIAL IMPRESSION / ASSESSMENT AND PLAN / ED COURSE  Pertinent labs & imaging results that were available during my care of the patient were reviewed by me and considered in my medical decision making (see chart for details).  Here in no acute distress, has anxiety.  Repeatedly asking me for "nerve medication but not antidepressants.  I have told her I can give her a dose here but I cannot send her home with a prescription for them. She is therefore at this time ready for discharge she has no SI she has no HI contracts for safety cannot legally keep her no toxidrome suggestive of acute overdose.  No evidence of self-harm.  States she has a safe place to go.  Very comfortable with departure.  Return precautions follow-up given and understood.  She was evaluated by counselor and given resources for outpatient follow-up which I will do as well.   ____________________________________________   FINAL CLINICAL IMPRESSION(S) /  ED DIAGNOSES  Final diagnoses:  None      This chart was dictated using voice recognition software.  Despite best efforts to proofread,  errors can occur which can change meaning.      Schuyler Amor, MD 01/06/19 2207

## 2019-01-06 NOTE — ED Triage Notes (Signed)
Pt states she has been arguing with husband, states has been having panic attacks. Husband talked to her about divorce today, states here for anxiety. States wants to see psychiatrist, denies any SI or HI.
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
## 2019-01-06 NOTE — ED Notes (Signed)
Pt belongings consists of 1 book, 1 necklace, 1 ring, 1 black legging, 1 white shirt, 1 green jacket, 1 jean jacket, 1 underwear, a pair of socks and a pair of shoes.

## 2019-01-06 NOTE — ED Notes (Signed)
Pt. Discharged to lobby to talk to mother.

## 2019-01-06 NOTE — ED Notes (Signed)
Pt. Presents to the ED voluntarily, pt. States financial and marital stressors at home.  Pt. States hx of anxiety and ADD.  Pt. Denies SI or HI.  Pt. Very tearful to this nurse.

## 2019-01-06 NOTE — Discharge Instructions (Addendum)
Please seek help with psychiatry as we discussed.  Return to the emergency room for any new or worrisome symptoms including thoughts of hurting yourself or anyone else.  Do not drive after the medication received today.

## 2019-02-01 ENCOUNTER — Ambulatory Visit: Payer: Self-pay | Admitting: Licensed Clinical Social Worker

## 2019-03-08 ENCOUNTER — Ambulatory Visit: Payer: Medicaid Other | Admitting: Family Medicine

## 2019-03-08 ENCOUNTER — Encounter: Payer: Self-pay | Admitting: Family Medicine

## 2019-03-08 NOTE — Progress Notes (Deleted)
Office Note 03/08/2019  CC: No chief complaint on file.   HPI:  Taylor Wiggins is a 38 y.o. White female who is here to establish care Patient's most recent primary MD: [place] (at [place], Dr. Neta Mends and others). Old records in EPIC/HL were reviewed prior to or during today's visit.  Most recent o/v with prior provider was 11/03/2018, at which time pt's adderall was RF'd and her diagnoses for that o/v were ADHD, opioid use disorder, and opioid dependence on agonist therapy. A UDS was pending as of the documentation in EMR.  The next o/v in EMR, 12/18/18 was listed as "cancelled by provider".   Past Medical History:  Diagnosis Date  . Asthma   . IBS (irritable bowel syndrome)     Past Surgical History:  Procedure Laterality Date  . BREAST REDUCTION SURGERY  2005    No family history on file.  Social History   Socioeconomic History  . Marital status: Single    Spouse name: Not on file  . Number of children: Not on file  . Years of education: Not on file  . Highest education level: Not on file  Occupational History  . Not on file  Social Needs  . Financial resource strain: Not on file  . Food insecurity:    Worry: Not on file    Inability: Not on file  . Transportation needs:    Medical: Not on file    Non-medical: Not on file  Tobacco Use  . Smoking status: Former Smoker    Packs/day: 0.50    Types: Cigarettes    Last attempt to quit: 09/06/2014    Years since quitting: 4.5  . Smokeless tobacco: Never Used  Substance and Sexual Activity  . Alcohol use: No  . Drug use: No    Types: Heroin, Benzodiazepines  . Sexual activity: Never    Birth control/protection: Abstinence  Lifestyle  . Physical activity:    Days per week: Not on file    Minutes per session: Not on file  . Stress: Not on file  Relationships  . Social connections:    Talks on phone: Not on file    Gets together: Not on file    Attends religious  service: Not on file    Active member of club or organization: Not on file    Attends meetings of clubs or organizations: Not on file    Relationship status: Not on file  . Intimate partner violence:    Fear of current or ex partner: Not on file    Emotionally abused: Not on file    Physically abused: Not on file    Forced sexual activity: Not on file  Other Topics Concern  . Not on file  Social History Narrative  . Not on file    Outpatient Encounter Medications as of 03/08/2019  Medication Sig  . albuterol (PROVENTIL HFA;VENTOLIN HFA) 108 (90 Base) MCG/ACT inhaler Inhale 2-4 puffs by mouth every 4 hours as needed for wheezing, cough, and/or shortness of breath  . amphetamine-dextroamphetamine (ADDERALL) 20 MG tablet Take 20 mg by mouth 2 (two) times daily.  Marland Kitchen aspirin EC 325 MG tablet Take 325 mg by mouth every 6 (six) hours as needed for moderate pain.  Marland Kitchen ibuprofen (ADVIL,MOTRIN) 100 MG tablet Take 400 mg by mouth every 6 (six) hours as needed for pain or fever (tooth pain).   . ondansetron (ZOFRAN) 4 MG tablet Take 1-2 tabs by mouth every 8 hours  as needed for nausea/vomiting   No facility-administered encounter medications on file as of 03/08/2019.     Allergies  Allergen Reactions  . Tramadol Other (See Comments)    Seizures   . Mold Extract [Trichophyton] Other (See Comments)    Sneezing    ROS *** PE; There were no vitals taken for this visit. *** Pertinent labs:    Chemistry      Component Value Date/Time   NA 136 01/06/2019 1912   NA 139 04/28/2015 1654   K 4.6 01/06/2019 1912   K 3.5 04/28/2015 1654   CL 106 01/06/2019 1912   CL 101 04/28/2015 1654   CO2 25 01/06/2019 1912   CO2 29 04/28/2015 1654   BUN 11 01/06/2019 1912   BUN 14 04/28/2015 1654   CREATININE 0.70 01/06/2019 1912   CREATININE 0.69 04/28/2015 1654      Component Value Date/Time   CALCIUM 9.7 01/06/2019 1912   CALCIUM 9.5 04/28/2015 1654   ALKPHOS 41 01/06/2019 1912   ALKPHOS 52  10/31/2014 0605   AST 18 01/06/2019 1912   AST 20 10/31/2014 0605   ALT 16 01/06/2019 1912   ALT 20 10/31/2014 0605   BILITOT 0.6 01/06/2019 1912   BILITOT 0.3 10/31/2014 0605     Lab Results  Component Value Date   WBC 10.3 01/06/2019   HGB 13.9 01/06/2019   HCT 41.2 01/06/2019   MCV 93.6 01/06/2019   PLT 401 (H) 01/06/2019   HIV neg 04/30/2018  ASSESSMENT AND PLAN:   No problem-specific Assessment & Plan notes found for this encounter.   An After Visit Summary was printed and given to the patient.  No follow-ups on file.

## 2019-03-09 ENCOUNTER — Ambulatory Visit: Payer: Self-pay | Admitting: Family Medicine

## 2019-04-16 DIAGNOSIS — F32A Depression, unspecified: Secondary | ICD-10-CM | POA: Insufficient documentation

## 2019-05-12 DIAGNOSIS — F1021 Alcohol dependence, in remission: Secondary | ICD-10-CM | POA: Insufficient documentation

## 2019-07-06 DIAGNOSIS — D2371 Other benign neoplasm of skin of right lower limb, including hip: Secondary | ICD-10-CM | POA: Insufficient documentation

## 2019-08-07 ENCOUNTER — Encounter (HOSPITAL_COMMUNITY): Payer: Self-pay | Admitting: Family Medicine

## 2019-08-07 ENCOUNTER — Other Ambulatory Visit: Payer: Self-pay

## 2019-08-07 ENCOUNTER — Ambulatory Visit (HOSPITAL_COMMUNITY)
Admission: EM | Admit: 2019-08-07 | Discharge: 2019-08-07 | Disposition: A | Discharge status: Home / Self Care | Payer: BLUE CROSS/BLUE SHIELD | Attending: Family Medicine | Admitting: Family Medicine

## 2019-08-07 DIAGNOSIS — R21 Rash and other nonspecific skin eruption: Secondary | ICD-10-CM

## 2019-08-07 MED ORDER — IVERMECTIN 3 MG PO TABS: 12.0000 mg | ORAL_TABLET | Freq: Once | ORAL | 0 refills | Status: AC

## 2019-08-07 NOTE — Discharge Instructions (Addendum)
The rash could be scabies, so we are treating with the single oral dose of ivermectin that eradicates scabies.  Use the bactroban for the sores on your legs after washing three times daily with soap and water.

## 2019-08-07 NOTE — ED Provider Notes (Signed)
Taylor Wiggins    CSN: [id number] Arrival date & time: 08/07/19  1518     History   Chief Complaint Chief Complaint  Patient presents with  . Insect Bite    HPI Taylor Wiggins is a 38 y.o. female.   Initial [facility] patient visit  38 yo woman presents with husband, both of whom are complaining of rash and possible scabies.   Note:   Both patient and spouse are on multiple CNS altering medications.     Past Medical History:  Diagnosis Date  . Adult ADHD   . Asthma   . IBS (irritable bowel syndrome)   . Opioid use disorder ([place])    on agonist therapy as of 10/2018 note by prior PCP    Patient Active Problem List   Diagnosis Date Noted  . Opiate addiction ([place]) 04/29/2018  . Cellulitis of right hand 04/29/2018  . Tobacco abuse 04/29/2018  . Anxiety disorder 04/29/2018  . Alcohol dependence ([place]) 04/28/2013    Past Surgical History:  Procedure Laterality Date  . BREAST REDUCTION SURGERY  2005    OB History   No obstetric history on file.      Home Medications    Prior to Admission medications   Medication Sig Start Date End Date Taking? Authorizing Provider  albuterol (PROVENTIL HFA;VENTOLIN HFA) 108 (90 Base) MCG/ACT inhaler Inhale 2-4 puffs by mouth every 4 hours as needed for wheezing, cough, and/or shortness of breath 02/07/18   Hinda Kehr, MD  amphetamine-dextroamphetamine (ADDERALL) 20 MG tablet Take 20 mg by mouth 2 (two) times daily.    [provider]  aspirin EC 325 MG tablet Take 325 mg by mouth every 6 (six) hours as needed for moderate pain.    [provider]  ibuprofen (ADVIL,MOTRIN) 100 MG tablet Take 400 mg by mouth every 6 (six) hours as needed for pain or fever (tooth pain).     [provider]  ondansetron (ZOFRAN) 4 MG tablet Take 1-2 tabs by mouth every 8 hours as needed for nausea/vomiting 02/07/18   Hinda Kehr, MD    Family History History reviewed. No pertinent family history.  Social  History Social History   Tobacco Use  . Smoking status: Former Smoker    Packs/day: 0.50    Types: Cigarettes    Quit date: 09/06/2014    Years since quitting: 4.9  . Smokeless tobacco: Never Used  Substance Use Topics  . Alcohol use: No  . Drug use: No    Types: Heroin, Benzodiazepines     Allergies   Tramadol and Mold extract [trichophyton]   Review of Systems Review of Systems  Skin: Positive for rash.  All other systems reviewed and are negative.    Physical Exam Triage Vital Signs ED Triage Vitals  Enc Vitals Group     BP      Pulse      Resp      Temp      Temp src      SpO2      Weight      Height      Head Circumference      Peak Flow      Pain Score      Pain Loc      Pain Edu?      Excl. in [place]?    No data found.  Updated Vital Signs BP 105/71 (BP Location: Left Arm)   Pulse 66   Temp 98.4 F (36.9 C) (  Oral)   Resp 16   LMP 08/07/2019   SpO2 99%    Physical Exam Vitals signs and nursing note reviewed.  Neck:     Musculoskeletal: Normal range of motion and neck supple.  Pulmonary:     Effort: Pulmonary effort is normal.  Musculoskeletal: Normal range of motion.  Skin:    Comments: Poor hygiene.  Several leg areas excoriated with surrounding erythema.  Neurological:     General: No focal deficit present.     Mental Status: She is alert.      UC Treatments / Results  Labs (all labs ordered are listed, but only abnormal results are displayed) Labs Reviewed - No data to display  EKG   Radiology No results found.  Procedures Procedures (including critical care time)  Medications Ordered in UC Medications - No data to display  Initial Impression / Assessment and Plan / UC Course  I have reviewed the triage vital signs and the nursing notes.  Pertinent labs & imaging results that were available during my care of the patient were reviewed by me and considered in my medical decision making (see chart for details).    Final  Clinical Impressions(s) / UC Diagnoses   Final diagnoses:  None   Discharge Instructions   None    ED Prescriptions    None     Controlled Substance Prescriptions [place] Controlled Substance Registry consulted? Not Applicable   Robyn Haber, MD 08/07/19 [phone]

## 2019-08-07 NOTE — ED Triage Notes (Signed)
Pt present insect bite all over her body. Pt states she is itching all over.

## 2019-11-24 DIAGNOSIS — L309 Dermatitis, unspecified: Secondary | ICD-10-CM | POA: Insufficient documentation

## 2020-02-19 ENCOUNTER — Inpatient Hospital Stay: Admit: 2020-02-19 | Payer: Self-pay | Source: Intra-hospital | Admitting: Psychiatry

## 2020-02-19 ENCOUNTER — Emergency Department
Admission: EM | Admit: 2020-02-19 | Discharge: 2020-02-20 | Disposition: A | Discharge status: Home / Self Care | Payer: BLUE CROSS/BLUE SHIELD | Attending: Emergency Medicine | Admitting: Emergency Medicine

## 2020-02-19 ENCOUNTER — Other Ambulatory Visit: Payer: Self-pay

## 2020-02-19 DIAGNOSIS — Z79899 Other long term (current) drug therapy: Secondary | ICD-10-CM | POA: Insufficient documentation

## 2020-02-19 DIAGNOSIS — F23 Brief psychotic disorder: Secondary | ICD-10-CM

## 2020-02-19 DIAGNOSIS — Z20822 Contact with and (suspected) exposure to covid-19: Secondary | ICD-10-CM | POA: Insufficient documentation

## 2020-02-19 DIAGNOSIS — F1120 Opioid dependence, uncomplicated: Secondary | ICD-10-CM | POA: Diagnosis present

## 2020-02-19 DIAGNOSIS — F17210 Nicotine dependence, cigarettes, uncomplicated: Secondary | ICD-10-CM | POA: Insufficient documentation

## 2020-02-19 DIAGNOSIS — F19950 Other psychoactive substance use, unspecified with psychoactive substance-induced psychotic disorder with delusions: Secondary | ICD-10-CM | POA: Insufficient documentation

## 2020-02-19 HISTORY — DX: Attention-deficit hyperactivity disorder, unspecified type: F90.9

## 2020-02-19 HISTORY — DX: Anxiety disorder, unspecified: F41.9

## 2020-02-19 HISTORY — DX: Post-traumatic stress disorder, unspecified: F43.10

## 2020-02-19 LAB — CBC
HCT: 38.2 % (ref 36.0–46.0)
Hemoglobin: 12.9 g/dL (ref 12.0–15.0)
MCH: 31 pg (ref 26.0–34.0)
MCHC: 33.8 g/dL (ref 30.0–36.0)
MCV: 91.8 fL (ref 80.0–100.0)
Platelets: 412 10*3/uL — ABNORMAL HIGH (ref 150–400)
RBC: 4.16 MIL/uL (ref 3.87–5.11)
RDW: 13 % (ref 11.5–15.5)
WBC: 8.9 10*3/uL (ref 4.0–10.5)
nRBC: 0 % (ref 0.0–0.2)

## 2020-02-19 LAB — COMPREHENSIVE METABOLIC PANEL
ALT: 14 U/L (ref 0–44)
AST: 17 U/L (ref 15–41)
Albumin: 4.3 g/dL (ref 3.5–5.0)
Alkaline Phosphatase: 38 U/L (ref 38–126)
Anion gap: 9 (ref 5–15)
BUN: 13 mg/dL (ref 6–20)
CO2: 27 mmol/L (ref 22–32)
Calcium: 9.3 mg/dL (ref 8.9–10.3)
Chloride: 104 mmol/L (ref 98–111)
Creatinine, Ser: 0.73 mg/dL (ref 0.44–1.00)
GFR calc Af Amer: >60 mL/min (ref >60)
GFR calc non Af Amer: >60 mL/min (ref >60)
Glucose, Bld: 102 mg/dL — ABNORMAL HIGH (ref 70–99)
Potassium: 3.9 mmol/L (ref 3.5–5.1)
Sodium: 140 mmol/L (ref 135–145)
Total Bilirubin: 0.7 mg/dL (ref 0.3–1.2)
Total Protein: 7.5 g/dL (ref 6.5–8.1)

## 2020-02-19 LAB — ACETAMINOPHEN LEVEL: Acetaminophen (Tylenol), Serum: <10 ug/mL — ABNORMAL LOW (ref 10–30)

## 2020-02-19 LAB — URINE DRUG SCREEN, QUALITATIVE (ARMC ONLY)
Amphetamines, Ur Screen: POSITIVE — AB
Barbiturates, Ur Screen: NOT DETECTED
Benzodiazepine, Ur Scrn: POSITIVE — AB
Cannabinoid 50 Ng, Ur ~~LOC~~: POSITIVE — AB
Cocaine Metabolite,Ur ~~LOC~~: NOT DETECTED
MDMA (Ecstasy)Ur Screen: NOT DETECTED
Methadone Scn, Ur: POSITIVE — AB
Opiate, Ur Screen: NOT DETECTED
Phencyclidine (PCP) Ur S: NOT DETECTED
Tricyclic, Ur Screen: NOT DETECTED

## 2020-02-19 LAB — RESPIRATORY PANEL BY RT PCR (FLU A&B, COVID)
Influenza A by PCR: NEGATIVE
Influenza B by PCR: NEGATIVE
SARS Coronavirus 2 by RT PCR: NEGATIVE

## 2020-02-19 LAB — PREGNANCY, URINE: Preg Test, Ur: NEGATIVE

## 2020-02-19 LAB — SALICYLATE LEVEL: Salicylate Lvl: <7 mg/dL — ABNORMAL LOW (ref 7.0–30.0)

## 2020-02-19 MED ORDER — ADULT MULTIVITAMIN W/MINERALS CH: 1.0000 | ORAL_TABLET | Freq: Every day | ORAL | Status: DC

## 2020-02-19 MED ORDER — GABAPENTIN 300 MG PO CAPS: 300.0000 mg | ORAL_CAPSULE | Freq: Three times a day (TID) | ORAL | Status: DC

## 2020-02-19 MED ORDER — LORAZEPAM 1 MG PO TABS: 1.0000 mg | ORAL_TABLET | Freq: Three times a day (TID) | ORAL | Status: DC

## 2020-02-19 MED ORDER — LORAZEPAM 1 MG PO TABS: 1.0000 mg | ORAL_TABLET | Freq: Two times a day (BID) | ORAL | Status: DC

## 2020-02-19 MED ORDER — LORAZEPAM 1 MG PO TABS: 1.0000 mg | ORAL_TABLET | Freq: Four times a day (QID) | ORAL | Status: DC | PRN

## 2020-02-19 MED ORDER — LORAZEPAM 1 MG PO TABS: 1.0000 mg | ORAL_TABLET | Freq: Four times a day (QID) | ORAL | Status: DC

## 2020-02-19 MED ORDER — ONDANSETRON 4 MG PO TBDP
4.0000 mg | ORAL_TABLET | Freq: Four times a day (QID) | ORAL | Status: DC | PRN
  Filled 2020-02-19: qty 1

## 2020-02-19 MED ORDER — LORAZEPAM 1 MG PO TABS: 1.0000 mg | ORAL_TABLET | Freq: Every day | ORAL | Status: DC

## 2020-02-19 MED ORDER — THIAMINE HCL 100 MG PO TABS: 100.0000 mg | ORAL_TABLET | Freq: Every day | ORAL | Status: DC

## 2020-02-19 MED ORDER — LOPERAMIDE HCL 2 MG PO CAPS
2.0000 mg | ORAL_CAPSULE | ORAL | Status: DC | PRN
  Filled 2020-02-19: qty 2

## 2020-02-19 MED ORDER — THIAMINE HCL 100 MG/ML IJ SOLN: 100.0000 mg | Freq: Once | INTRAMUSCULAR | Status: DC

## 2020-02-19 MED ORDER — HYDROXYZINE HCL 25 MG PO TABS: 25.0000 mg | ORAL_TABLET | Freq: Four times a day (QID) | ORAL | Status: DC | PRN

## 2020-02-19 MED ORDER — DROPERIDOL 2.5 MG/ML IJ SOLN
5.0000 mg | Freq: Once | INTRAMUSCULAR | Status: AC
  Administered 2020-02-19: 19:00:00 5 mg via INTRAVENOUS
  Filled 2020-02-19: qty 2

## 2020-02-19 MED ORDER — OLANZAPINE 5 MG PO TABS: 2.5000 mg | ORAL_TABLET | Freq: Two times a day (BID) | ORAL | Status: DC
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
## 2020-02-19 NOTE — ED Notes (Signed)
Patients husband called last night.  Stated he was staying in hotel, but would call again in morning when pt. Was awake.

## 2020-02-19 NOTE — ED Notes (Signed)
Husband allowed to speak to pt with one monitored phone call.

## 2020-02-19 NOTE — ED Notes (Signed)
Pt. Currently sleeping in [facility] bed #4.

## 2020-02-19 NOTE — ED Notes (Signed)
Pt changed out by this RN and Airline pilot.   Pt belongings placed in 2 bags and labeled.   1 pair of jeans, 1 long sleeve black shirt, 1 jean jacket, 1 other jacket,1 brown purse, 1 underwear, 1 paid of slip on slide shoes, 1 pair earrings, 1 necklace, 1 hair-tie/bracelet, 1 white ring. No bra. No cell phone noted but could have been in jacket pocket.
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
## 2020-02-19 NOTE — ED Notes (Signed)
Pt refusing all meds. Pt does state that she will go downstairs willingly. PRN med held at this time. Pt remains upset and wants to use phone but is explained to multiple times that she cannot use the phone due to calling 911.

## 2020-02-19 NOTE — BH Assessment (Signed)
Patient is to be admitted to [facility] by Psychiatric Nurse Practitioner Waylan Boga.  Attending Physician will be Dr. Weber Cooks.   Patient has been assigned to room 319, by [facility] Charge Nurse [place] staff is aware of the admission:  N[place], ER Secretary    Dr. Charna Archer, ER MD   Janett Billow, Patient's Nurse   Vikki Ports, Patient Access

## 2020-02-19 NOTE — ED Notes (Signed)
Pt to ED via POV, accompanied by [place] [employer]. Pt states that she is here for evaluation because her family thinks she is harming herself but she thinks the marks she has is from an infection.

## 2020-02-19 NOTE — ED Notes (Signed)
Pt was given food tray and drink. Which she did begin eating.

## 2020-02-19 NOTE — Consult Note (Addendum)
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 female patient admitted with psychosis.  Patient seen and evaluated in person by this provider along with TTS.  She was very tangential on assessment and fixated on her skin condition.  Reports that she was misdiagnosed with scabies 6 to 7 months ago and needs to go to a dermatologist where they will lay her off her skin per her report.  She feels like it is a fungal infection and responds only to Monistat.  Fingernails are very erythematous and she continues to scratch at multiple scabs on her legs and arms during assessment.  Long history of substance abuse including alcohol and opiates.  She was stabilized on Suboxone and then switched to methadone obtained by her boyfriend as they can no longer afford the Suboxone.  Continues to use her Adderall and Klonopin prescribed from her provider, negative for benzos on admission.  Ativan detox protocol started.  Prior to admission she was arguing with her spouse and the police were called to the scene.  She reports her mother is on her "death bed" and her father has advanced dementia.  Minimizes her paranoia that her mother is trying to kill her and jealous of her life.  Patient feels she does not have a problem at this time.  Patient requested this provider speak to her husband, Montee.  Telephone conversation ensued where he was waiting for her to be released as he feels like he can keep her safe but yet he does acknowledge she does need help.  Reports he has been going to a methadone clinic and not taking all  of his methadone so he can give her the methadone.  This is due to financial difficulties because they both have lost their jobs and have recently had to move in with her parents.  He is also concerned because he is does not know where he will sleep tonight.  Discussed the need for her to be stabilized prior to discharge in the inpatient facility based on this provider's evaluation and the EDPs.  He reports that she is a hypochondriac and minimizes her skin issues.  Patient continued to be highly anxious and agitated in the [place].  HPI per TTS:  Taylor Wiggins is an 39 y.o. female who presents to the ER via law enforcement due to her family having concerns about her mental and emotional state. Per information giving to ER staff, law enforcement was called to the home for a domestic dispute because the patient and her family were arguing. While law enforcement was at the home, the patient made several references and complaints about a rash on her arm, only she could see. The officer was able to convinced the patient to come to the ER, with the understanding she was coming address and care for the rash. However, law enforcement was concerned about her mental state as well.  Per the report of the patient, she is in the ER because her mother is mad at her. She further reports, her mother has a drinking problem and she's the one who needs help. Patient denies the use of any mind-altering substances,  without been asked by Probation officer. Patient UDS was positive for; Amphetamine, Cannabis, Benzodiazepines & Methadone. As well as alcohol. She briefly spoke about the "rash on her arm." After pointing to the area, she believed the rash was at, she started stating she didn't need to be in the hospital because "nothing is wrong with me." When asked about, what appeared to be injection sites, she said, "oh that's nothing. I'm here for the rash."  During the interview, the patient was hyperverbal and restless. She was tangential  but able to be redirected. At times her thoughts were disorganized and some of her answers didn't relate to the questions.  Several times during the interview, the patient became frustrated and annoyed with the questions. Due to patient's presentation and behaviors while in the ER, he shortened the time he spoke with her, in order to prevent further agitation and irritability.  Past Psychiatric History:  Substance use disorder  Risk to Self:  yes Risk to Others:  none Prior Inpatient Therapy:  none Prior Outpatient Therapy:  Dr Sherrie Mustache  Past Medical History:  Past Medical History:  Diagnosis Date  . ADHD   . Adult ADHD   . Anxiety   . Asthma   . IBS (irritable bowel syndrome)   . Opioid use disorder ([place])    on agonist therapy as of 10/2018 note by prior PCP  . PTSD (post-traumatic stress disorder)     Past Surgical History:  Procedure Laterality Date  . BREAST REDUCTION SURGERY  2005   Family History: History reviewed. No pertinent family history. Family Psychiatric  History: none Social History:  Social History   Substance and Sexual Activity  Alcohol Use No     Social History   Substance and Sexual Activity  Drug Use Yes  . Types: Heroin, Benzodiazepines, Marijuana    Social History   Socioeconomic History  . Marital status: Single    Spouse name: Not on file  . Number of children: Not on file  . Years of education: Not on file  . Highest education level: Not on file  Occupational History  . Not on file  Tobacco Use  . Smoking status: Current Every Day Smoker    Packs/day: 0.50    Types: Cigarettes    Last attempt to quit: 09/06/2014    Years since quitting: 5.4  . Smokeless tobacco: Never Used  Substance and Sexual Activity  . Alcohol use: No  . Drug use: Yes    Types: Heroin, Benzodiazepines, Marijuana  . Sexual activity: Never    Birth control/protection: Abstinence  Other Topics Concern  . Not on file  Social History Narrative  . Not on file    Social Determinants of Health   Financial Resource Strain:   . Difficulty of Paying Living Expenses: Not on file  Food Insecurity:   . Worried About Charity fundraiser in the Last Year: Not on file  . Ran Out of Food in the Last Year: Not on file  Transportation Needs:   . Lack of Transportation (Medical): Not on file  . Lack of Transportation (Non-Medical): Not on file  Physical Activity:   . Days of Exercise per Week: Not on file  . Minutes of Exercise per Session: Not on file  Stress:   . Feeling of Stress : Not on file  Social Connections:   . Frequency of Communication with Friends and Family: Not on file  . Frequency of Social Gatherings with Friends and Family: Not on file  .  Attends Religious Services: Not on file  . Active Member of Clubs or Organizations: Not on file  . Attends Archivist Meetings: Not on file  . Marital Status: Not on file   Additional Social History:    Allergies:   Allergies  Allergen Reactions  . Tramadol Other (See Comments)    Seizures   . Mold Extract [Trichophyton] Other (See Comments)    Sneezing    Labs:  Results for orders placed or performed during the hospital encounter of 02/19/20 (from the past 48 hour(s))  Comprehensive metabolic panel     Status: Abnormal   Collection Time: 02/19/20  2:08 PM  Result Value Ref Range   Sodium 140 135 - 145 mmol/L   Potassium 3.9 3.5 - 5.1 mmol/L   Chloride 104 98 - 111 mmol/L   CO2 27 22 - 32 mmol/L   Glucose, Bld 102 (H) 70 - 99 mg/dL   BUN 13 6 - 20 mg/dL   Creatinine, Ser 0.73 0.44 - 1.00 mg/dL   Calcium 9.3 8.9 - 10.3 mg/dL   Total Protein 7.5 6.5 - 8.1 g/dL   Albumin 4.3 3.5 - 5.0 g/dL   AST 17 15 - 41 U/L   ALT 14 0 - 44 U/L   Alkaline Phosphatase 38 38 - 126 U/L   Total Bilirubin 0.7 0.3 - 1.2 mg/dL   GFR calc non Af Amer >60 >60 mL/min   GFR calc Af Amer >60 >60 mL/min   Anion gap 9 5 - 15    Comment: Performed at [facility], [address]  [place], [place] [address]  Ethanol     Status: Abnormal   Collection Time: 02/19/20  2:08 PM  Result Value Ref Range   Alcohol, Ethyl (B) 14 (H) <10 mg/dL    Comment: (NOTE) Lowest detectable limit for serum alcohol is 10 mg/dL. For medical purposes only. Performed at [facility], [address] [place], [place] [id number]   Salicylate level     Status: Abnormal   Collection Time: 02/19/20  2:08 PM  Result Value Ref Range   Salicylate Lvl [id number] (L) 7.0 - 30.0 mg/dL    Comment: Performed at [facility], [address] [place], [place] [address]  Acetaminophen level     Status: Abnormal   Collection Time: 02/19/20  2:08 PM  Result Value Ref Range   Acetaminophen (Tylenol), Serum <10 (L) 10 - 30 ug/mL    Comment: (NOTE) Therapeutic concentrations vary significantly. A range of 10-30 ug/mL  may be an effective concentration for many patients. However, some  are best treated at concentrations outside of this range. Acetaminophen concentrations >150 ug/mL at 4 hours after ingestion  and >50 ug/mL at 12 hours after ingestion are often associated with  toxic reactions. Performed at [facility], [address] [place], [place] [address]   cbc     Status: Abnormal   Collection Time: 02/19/20  2:08 PM  Result Value Ref Range   WBC 8.9 4.0 - 10.5 K/uL   RBC 4.16 3.87 - 5.11 MIL/uL   Hemoglobin 12.9 12.0 - 15.0 g/dL   HCT 38.2 36.0 - 46.0 %   MCV 91.8 80.0 - 100.0 fL   MCH 31.0 26.0 - 34.0 pg   MCHC 33.8 30.0 - 36.0 g/dL   RDW 13.0 11.5 - 15.5 %   Platelets 412 (H) 150 - 400 K/uL   nRBC 0.0 0.0 - 0.2 %    Comment:  Performed at [facility], [address] [place], [place] [address]  Urine Drug Screen, Qualitative     Status: Abnormal   Collection Time: 02/19/20  2:08 PM  Result Value Ref Range   Tricyclic, Ur Screen NONE DETECTED NONE DETECTED   Amphetamines, Ur Screen POSITIVE (A) NONE DETECTED   MDMA (Ecstasy)Ur Screen NONE DETECTED NONE  DETECTED   Cocaine Metabolite,Ur [place] NONE DETECTED NONE DETECTED   Opiate, Ur Screen NONE DETECTED NONE DETECTED   Phencyclidine (PCP) Ur S NONE DETECTED NONE DETECTED   Cannabinoid 50 Ng, Ur [place] POSITIVE (A) NONE DETECTED   Barbiturates, Ur Screen NONE DETECTED NONE DETECTED   Benzodiazepine, Ur Scrn POSITIVE (A) NONE DETECTED   Methadone Scn, Ur POSITIVE (A) NONE DETECTED    Comment: (NOTE) Tricyclics + metabolites, urine    Cutoff 1000 ng/mL Amphetamines + metabolites, urine  Cutoff 1000 ng/mL MDMA (Ecstasy), urine              Cutoff 500 ng/mL Cocaine Metabolite, urine          Cutoff 300 ng/mL Opiate + metabolites, urine        Cutoff 300 ng/mL Phencyclidine (PCP), urine         Cutoff 25 ng/mL Cannabinoid, urine                 Cutoff 50 ng/mL Barbiturates + metabolites, urine  Cutoff 200 ng/mL Benzodiazepine, urine              Cutoff 200 ng/mL Methadone, urine                   Cutoff 300 ng/mL The urine drug screen provides only a preliminary, unconfirmed analytical test result and should not be used for non-medical purposes. Clinical consideration and professional judgment should be applied to any positive drug screen result due to possible interfering substances. A more specific alternate chemical method must be used in order to obtain a confirmed analytical result. Gas chromatography / mass spectrometry (GC/MS) is the preferred confirmat ory method. Performed at [facility], [address] [place], [place] [address]   Pregnancy, urine     Status: None   Collection Time: 02/19/20  2:08 PM  Result Value Ref Range   Preg Test, Ur NEGATIVE NEGATIVE    Comment: Performed at [facility], [address] [place], [place] [address]  Respiratory Panel by RT PCR (Flu A&B, Covid) - Nasopharyngeal Swab     Status: None   Collection Time: 02/19/20  3:42 PM   Specimen: Nasopharyngeal Swab  Result Value Ref Range   SARS Coronavirus 2 by RT PCR NEGATIVE  NEGATIVE    Comment: (NOTE) SARS-CoV-2 target nucleic acids are NOT DETECTED. The SARS-CoV-2 RNA is generally detectable in upper respiratoy specimens during the acute phase of infection. The lowest concentration of SARS-CoV-2 viral copies this assay can detect is 131 copies/mL. A negative result does not preclude SARS-Cov-2 infection and should not be used as the sole basis for treatment or other patient management decisions. A negative result may occur with  improper specimen collection/handling, submission of specimen other than nasopharyngeal swab, presence of viral mutation(s) within the areas targeted by this assay, and inadequate number of viral copies (<131 copies/mL). A negative result must be combined with clinical observations, patient history, and epidemiological information. The expected result is Negative. Fact Sheet for Patients:  PinkCheek.be Fact Sheet for Healthcare Providers:  GravelBags.it This test is not yet ap proved or  cleared by the [place] and  has been authorized for detection and/or diagnosis of SARS-CoV-2 by FDA under an Emergency Use Authorization (EUA). This EUA will remain  in effect (meaning this test can be used) for the duration of the COVID-19 declaration under Section 564(b)(1) of the Act, 21 U.S.C. section 360bbb-3(b)(1), unless the authorization is terminated or revoked sooner.    Influenza A by PCR NEGATIVE NEGATIVE   Influenza B by PCR NEGATIVE NEGATIVE    Comment: (NOTE) The Xpert Xpress SARS-CoV-2/FLU/RSV assay is intended as an aid in  the diagnosis of influenza from Nasopharyngeal swab specimens and  should not be used as a sole basis for treatment. Nasal washings and  aspirates are unacceptable for Xpert Xpress SARS-CoV-2/FLU/RSV  testing. Fact Sheet for Patients: PinkCheek.be Fact Sheet for Healthcare  Providers: GravelBags.it This test is not yet approved or cleared by the [place] FDA and  has been authorized for detection and/or diagnosis of SARS-CoV-2 by  FDA under an Emergency Use Authorization (EUA). This EUA will remain  in effect (meaning this test can be used) for the duration of the  Covid-19 declaration under Section 564(b)(1) of the Act, 21  U.S.C. section 360bbb-3(b)(1), unless the authorization is  terminated or revoked. Performed at [facility], [address], [place], [place] [address]     Current Facility-Administered Medications  Medication Dose Route Frequency Provider Last Rate Last Admin  . droperidol (INAPSINE) 2.5 MG/ML injection 5 mg  5 mg Intravenous Once Blake Divine, MD      . gabapentin (NEURONTIN) capsule 300 mg  300 mg Oral TID Patrecia Pour, NP      . hydrOXYzine (ATARAX/VISTARIL) tablet 25 mg  25 mg Oral Q6H PRN Patrecia Pour, NP      . loperamide (IMODIUM) capsule 2-4 mg  2-4 mg Oral PRN Patrecia Pour, NP      . LORazepam (ATIVAN) tablet 1 mg  1 mg Oral Q6H PRN Patrecia Pour, NP      . LORazepam (ATIVAN) tablet 1 mg  1 mg Oral QID Patrecia Pour, NP       Followed by  . [START ON 02/20/2020] LORazepam (ATIVAN) tablet 1 mg  1 mg Oral TID Patrecia Pour, NP       Followed by  . [START ON 02/21/2020] LORazepam (ATIVAN) tablet 1 mg  1 mg Oral BID Patrecia Pour, NP       Followed by  . [START ON 02/23/2020] LORazepam (ATIVAN) tablet 1 mg  1 mg Oral Daily Lord, Jamison Y, NP      . multivitamin with minerals tablet 1 tablet  1 tablet Oral Daily Lord, Jamison Y, NP      . OLANZapine (ZYPREXA) tablet 2.5 mg  2.5 mg Oral BID Patrecia Pour, NP      . ondansetron (ZOFRAN-ODT) disintegrating tablet 4 mg  4 mg Oral Q6H PRN Patrecia Pour, NP      . thiamine (B-1) injection 100 mg  100 mg Intramuscular Once Patrecia Pour, NP      . Derrill Memo ON 02/20/2020] thiamine tablet 100 mg  100 mg Oral Daily Patrecia Pour, NP       Current Outpatient Medications  Medication Sig Dispense Refill  . amphetamine-dextroamphetamine (ADDERALL) 30 MG tablet Take 30 mg by mouth 2 (two) times daily.     . clonazePAM (KLONOPIN) 1 MG tablet Take 1 mg by mouth 2 (two) times daily as  needed for anxiety.    . vitamin C (ASCORBIC ACID) 250 MG tablet Take 250 mg by mouth daily.    Marland Kitchen morphine (MSIR) 15 MG tablet Take 5 mg by mouth.      Musculoskeletal: Strength & Muscle Tone: within normal limits Gait & Station: normal Patient leans: N/A  Psychiatric Specialty Exam: Physical Exam  Nursing note and vitals reviewed. Constitutional: She is oriented to person, place, and time. She appears well-developed and well-nourished.  HENT:  Head: Normocephalic.  Respiratory: Effort normal.  Musculoskeletal:        General: Normal range of motion.     Cervical back: Normal range of motion.  Neurological: She is alert and oriented to person, place, and time.  Psychiatric: Her mood appears anxious. Her affect is labile. Her speech is rapid and/or pressured and tangential. She is hyperactive. Thought content is delusional. Cognition and memory are impaired. She expresses impulsivity. She is inattentive.    Review of Systems  Blood pressure (!) 123/93, pulse (!) 51, temperature 98.3 F (36.8 C), temperature source Oral, resp. rate 18, height 5\' 7"  (1.702 m), weight 68 kg, last menstrual period 02/16/2020, SpO2 98 %.Body mass index is 23.49 kg/m.  General Appearance: Disheveled  Eye Contact:  Fair  Speech:  Pressured  Volume:  Normal  Mood:  Anxious  Affect:  Congruent  Thought Process:  Coherent  Orientation:  Full (Time, Place, and Person)  Thought Content:  Delusions  Suicidal Thoughts:  No  Homicidal Thoughts:  No  Memory:  Immediate;   Fair Recent;   Fair Remote;   Fair  Judgement:  Impaired  Insight:  Lacking  Psychomotor Activity:  Increased  Concentration:  Concentration: Poor and Attention Span: Poor   Recall:  [employer] of Knowledge:  Fair  Language:  Fair  Akathisia:  No  Handed:  Right  AIMS (if indicated):     Assets:  Leisure Time Physical Health Resilience Social Support  ADL's:  Intact  Cognition:  Impaired,  Mild  Sleep:      39 year old female IVC'd by her mother for agitation and concerns for her safety, long history of substance abuse.  On admission she was highly agitated, tangential, and delusional.  Her anxiety and agitation continued and psychiatric admission was pending.  Treatment Plan Summary: Daily contact with patient to assess and evaluate symptoms and progress in treatment, Medication management and Plan substance induced psychosis:  -Started gabapentin 300 mg TID -Started Zypexa 2.5 mg BID  Benzodiazepine dependence: -Started Ativan alcohol detox protocol  Disposition: Psychiatric inpatient admission recommended  Waylan Boga, NP 02/19/2020 5:33 PM   Case discussed and plan agreed upon as outlined above by nurse practitioner Lord.

## 2020-02-19 NOTE — ED Provider Notes (Signed)
[facility] Emergency Department Provider Note  ____________________________________________  Time seen: Approximately 3:07 PM  I have reviewed the triage vital signs and the nursing notes.   HISTORY  Chief Complaint Psychiatric Evaluation    Level 5 Caveat: Portions of the History and Physical including HPI and review of systems are unable to be completely obtained due to patient being a poor historian   HPI Taylor Wiggins is a 39 y.o. female brought in by police due to a domestic disturbance at patient's home.  Patient was found to be agitated, states that she has had a yeast infection over her entire body that she has been treating with frequent application of hydrogen peroxide and trying to scrape it off with a knife.  Also feels afraid that her mother is trying to kill her.  Patient speaks rapidly and continuously.  Denies SI HI or hallucinations.      Past Medical History:  Diagnosis Date  . ADHD   . Adult ADHD   . Anxiety   . Asthma   . IBS (irritable bowel syndrome)   . Opioid use disorder ([place])    on agonist therapy as of 10/2018 note by prior PCP  . PTSD (post-traumatic stress disorder)      Patient Active Problem List   Diagnosis Date Noted  . Opiate addiction ([place]) 04/29/2018  . Cellulitis of right hand 04/29/2018  . Tobacco abuse 04/29/2018  . Anxiety disorder 04/29/2018  . Alcohol dependence ([place]) 04/28/2013     Past Surgical History:  Procedure Laterality Date  . BREAST REDUCTION SURGERY  2005     Prior to Admission medications   Medication Sig Start Date End Date Taking? Authorizing Provider  albuterol (PROVENTIL HFA;VENTOLIN HFA) 108 (90 Base) MCG/ACT inhaler Inhale 2-4 puffs by mouth every 4 hours as needed for wheezing, cough, and/or shortness of breath 02/07/18   Hinda Kehr, MD  amphetamine-dextroamphetamine (ADDERALL) 20 MG tablet Take 20 mg by mouth 2 (two) times daily.    [provider]  aspirin EC 325  MG tablet Take 325 mg by mouth every 6 (six) hours as needed for moderate pain.    [provider]  ibuprofen (ADVIL,MOTRIN) 100 MG tablet Take 400 mg by mouth every 6 (six) hours as needed for pain or fever (tooth pain).     [provider]  ondansetron (ZOFRAN) 4 MG tablet Take 1-2 tabs by mouth every 8 hours as needed for nausea/vomiting 02/07/18   Hinda Kehr, MD     Allergies Tramadol and Mold extract [trichophyton]   History reviewed. No pertinent family history.  Social History Social History   Tobacco Use  . Smoking status: Current Every Day Smoker    Packs/day: 0.50    Types: Cigarettes    Last attempt to quit: 09/06/2014    Years since quitting: 5.4  . Smokeless tobacco: Never Used  Substance Use Topics  . Alcohol use: No  . Drug use: Yes    Types: Heroin, Benzodiazepines, Marijuana    Review of Systems Level 5 Caveat: Portions of the History and Physical including HPI and review of systems are unable to be completely obtained due to patient being a poor historian   Constitutional:   No known fever.  ENT:   No rhinorrhea. Cardiovascular:   No chest pain or syncope. Respiratory:   No dyspnea or cough. Gastrointestinal:   Negative for abdominal pain, vomiting and diarrhea.  Musculoskeletal:   Negative for focal pain or swelling ____________________________________________  PHYSICAL EXAM:  VITAL SIGNS: ED Triage Vitals  Enc Vitals Group     BP 02/19/20 1400 (!) 123/93     Pulse Rate 02/19/20 1400 (!) 51     Resp 02/19/20 1400 18     Temp 02/19/20 1400 98.3 F (36.8 C)     Temp Source 02/19/20 1400 Oral     SpO2 02/19/20 1400 98 %     Weight 02/19/20 1401 150 lb (68 kg)     Height 02/19/20 1401 5\' 7"  (1.702 m)     Head Circumference --      Peak Flow --      Pain Score 02/19/20 1401 3     Pain Loc --      Pain Edu? --      Excl. in [place]? --     Vital signs reviewed, nursing assessments reviewed.   Constitutional:   Alert and  oriented. Non-toxic appearance.  Agitated Eyes:   Conjunctivae are normal. EOMI.  ENT      Head:   Normocephalic and atraumatic.  Cardiovascular:   RRR.  Respiratory:   Normal respiratory effort without tachypnea/retractions.  Musculoskeletal:   Normal range of motion in all extremities. No joint effusions.  No lower extremity tenderness.  No edema. Neurologic:   Pressured speech.  Motor grossly intact. No acute focal neurologic deficits are appreciated.  Skin:    Skin is warm, dry and intact.  Scattered scratch marks, but no significant wounds.  No rash noted.  No petechiae, purpura, or bullae.  ____________________________________________    LABS (pertinent positives/negatives) (all labs ordered are listed, but only abnormal results are displayed) Labs Reviewed  COMPREHENSIVE METABOLIC PANEL - Abnormal; Notable for the following components:      Result Value   Glucose, Bld 102 (*)    All other components within normal limits  ETHANOL - Abnormal; Notable for the following components:   Alcohol, Ethyl (B) 14 (*)    All other components within normal limits  SALICYLATE LEVEL - Abnormal; Notable for the following components:   Salicylate Lvl [id number] (*)    All other components within normal limits  ACETAMINOPHEN LEVEL - Abnormal; Notable for the following components:   Acetaminophen (Tylenol), Serum <10 (*)    All other components within normal limits  CBC - Abnormal; Notable for the following components:   Platelets 412 (*)    All other components within normal limits  URINE DRUG SCREEN, QUALITATIVE ([facility] ONLY) - Abnormal; Notable for the following components:   Amphetamines, Ur Screen POSITIVE (*)    Cannabinoid 50 Ng, Ur [place] POSITIVE (*)    Benzodiazepine, Ur Scrn POSITIVE (*)    Methadone Scn, Ur POSITIVE (*)    All other components within normal limits  PREGNANCY, URINE    ____________________________________________   EKG    ____________________________________________    RADIOLOGY  No results found.  ____________________________________________   PROCEDURES Procedures  ____________________________________________    CLINICAL IMPRESSION / ASSESSMENT AND PLAN / ED COURSE  Medications ordered in the ED: Medications - No data to display  Pertinent labs & imaging results that were available during my care of the patient were reviewed by me and considered in my medical decision making (see chart for details).   Taylor Wiggins was evaluated in Emergency Department on 02/19/2020 for the symptoms described in the history of present illness. She was evaluated in the context of the global COVID-19 pandemic, which necessitated consideration that the patient might be at risk for  infection with the SARS-CoV-2 virus that causes COVID-19. Institutional protocols and algorithms that pertain to the evaluation of patients at risk for COVID-19 are in a state of rapid change based on information released by regulatory bodies including the CDC and federal and state organizations. These policies and algorithms were followed during the patient's care in the ED.   Patient presents with paranoia, delusional disorder, agitation, pressured speech consistent with acute psychosis.  IVC initiated pending psychiatry evaluation and recommendations.      ____________________________________________   FINAL CLINICAL IMPRESSION(S) / ED DIAGNOSES    Final diagnoses:  Acute psychosis [facility])     ED Discharge Orders    None      Portions of this note were generated with dragon dictation software. Dictation errors may occur despite best attempts at proofreading.   Carrie Mew, MD 02/19/20 [phone]

## 2020-02-19 NOTE — ED Notes (Signed)
Pt called 911 with phone and therefore has NO PHONE PRIVILEGES at this time.

## 2020-02-20 MED ORDER — LACTATED RINGERS IV BOLUS: 1000.0000 mL | Freq: Once | INTRAVENOUS | Status: DC

## 2020-02-20 NOTE — ED Notes (Signed)
Pt visualized resting in bed with lights dimmed, pt able to reposition self in bed without difficulty. Respirations even and unlabored at this time. Breakfast tray remains at bedside untouched but their for patient when she awakens.

## 2020-02-20 NOTE — ED Notes (Signed)
Pt provided h2o and ginger ale and oral rehydration by this RN is encouraged for pt per EDP.

## 2020-02-20 NOTE — ED Notes (Signed)
This note is not being shared with the patient for the following reason: To prevent harm (release of this note would result in harm to the life or physical safety of the patient or another).  Pt moved to room 24 due to finding a low blood pressure reading while in bhu. Pt doesn't appear weak at this time, pt is agitated and pulls at bp cuff due to it coming disconnected and threw it in the floor. Pt states, "i'm not a psyc patient, im not supposed to be here! If you could please get me a phone so that I could call my husband and get this all straightened out." pt informed that this RN would see what I could do. Pt states that I do feel weak and I haven't had anything to drink in 2 days", mucous membranes appear moist at this time and pt requesting a gingerale.

## 2020-02-20 NOTE — ED Notes (Signed)
Provided pt w/ phone to call husband in preparation for DC.

## 2020-02-20 NOTE — ED Provider Notes (Signed)
-----------------------------------------   5:57 AM on 02/20/2020 -----------------------------------------   Blood pressure (!) 123/93, pulse (!) 51, temperature 98.3 F (36.8 C), temperature source Oral, resp. rate 18, height 5\' 7"  (1.702 m), weight 68 kg, last menstrual period 02/16/2020, SpO2 98 %.  The patient had no acute events since last update.  Calm and cooperative at this time.  Disposition is pending per Psychiatry/Behavioral Medicine team recommendations.     Alfred Levins, [place], MD 02/20/20 [phone]

## 2020-02-20 NOTE — ED Notes (Signed)
Pt ambulatory to the nurses station at this time. Pt noted to be tearful, VS obtained by this RN, pt noted to be hypotensive at this time, Wende Crease, Psych NP made aware, Charge RN made aware, plan in place to move patient to room 24 at this time for medical evaluation.

## 2020-02-20 NOTE — ED Notes (Signed)
Pt states "I need to have my husband come get me today but they did this IVC so he's going to have to call the magistrate". Pt repeatedly stating she doesn't belong here, repeatedly stating "I'm not suicidal, I'm not fucking suicidal". Pt states she was not seen by psychiatrist yesterday, was only seen by EDP, then pt states that psychiatrist came and "only asked [her] name". Pt noted to be tearful at this time. Pt states "my mom is an alcoholic, she thinks these (referring to her arms), are suicidal but they aren't, I have a fungal infection in my fingernails".

## 2020-02-20 NOTE — ED Provider Notes (Addendum)
Patient moved back to pod for concern for low blood pressures.  Patient's blood pressure on recheck was in the [id number] systolic.  Patient is tolerating p.o.  Patient looks very well.  We will continue to monitor blood pressures.   Patient was reevaluated by the psychiatric team and was now clear for discharge home.  They rescinded her IVC.  Vanessa Dovray, MD 02/20/20 1030    Vanessa [place], MD 02/20/20 [phone]

## 2020-02-20 NOTE — Consult Note (Addendum)
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  On admission she was highly agitated, tangential, and delusional.  Today after sleeping, she presents calm and cooperative with no delusions or hypomania.  Suspect her symptoms yesterday were facilitated by drug use as she stabilized so quickly.  UDS positive for amphetamines, benzodiazepines, and methadone.  Denies suicidal/homicidal ideations, hallucinations, and withdrawal symptoms.  Recommend substance abuse rehab, patient declined.  She does report that she is going to go live with her friend Bethann Berkshire in [place] and not return to live with her parents.  Collateral information obtained by her spouse, Monty. He continues to deny any safety concerns for his wife and plans to be with her after discharge.  Psychiatrically stable at this time.  Total Time spent with patient: 30 minutes  Past Psychiatric History: Substance abuse  Past Medical History:  Past Medical History:  Diagnosis Date  . ADHD   . Adult ADHD   . Anxiety   . Asthma   . IBS (irritable bowel syndrome)   . Opioid use disorder ([place])    on agonist therapy as of 10/2018 note by prior PCP  . PTSD (post-traumatic stress disorder)     Past Surgical History:  Procedure Laterality Date  . BREAST REDUCTION SURGERY  2005   Family History: History reviewed. No pertinent family history. Family Psychiatric  History: Mother with alcohol use disorder Social History:  Social History   Substance and Sexual Activity  Alcohol Use No     Social History   Substance and Sexual Activity   Drug Use Yes  . Types: Heroin, Benzodiazepines, Marijuana    Social History   Socioeconomic History  . Marital status: Single    Spouse name: Not on file  . Number of children: Not on file  . Years of education: Not on file  . Highest education level: Not on file  Occupational History  . Not on file  Tobacco Use  . Smoking status: Current Every Day Smoker    Packs/day: 0.50    Types: Cigarettes    Last attempt to quit: 09/06/2014    Years since quitting: 5.4  . Smokeless tobacco: Never Used  Substance and Sexual Activity  . Alcohol use: No  . Drug use: Yes    Types: Heroin, Benzodiazepines, Marijuana  . Sexual activity: Never    Birth control/protection: Abstinence  Other Topics Concern  . Not on file  Social History Narrative  . Not on file   Social Determinants of Health   Financial Resource Strain:   . Difficulty of Paying Living Expenses: Not on file  Food Insecurity:   . Worried About Charity fundraiser in the Last Year: Not on file  . Ran Out of Food in the Last Year: Not on file  Transportation Needs:   . Lack of Transportation (Medical): Not on file  . Lack of Transportation (Non-Medical): Not on file  Physical Activity:   . Days of Exercise per Week: Not on file  . Minutes of Exercise per Session: Not on file  Stress:   . Feeling of Stress : Not on file  Social Connections:   .  Frequency of Communication with Friends and Family: Not on file  . Frequency of Social Gatherings with Friends and Family: Not on file  . Attends Religious Services: Not on file  . Active Member of Clubs or Organizations: Not on file  . Attends Archivist Meetings: Not on file  . Marital Status: Not on file    Has this patient used any form of tobacco in the last 30 days? (Cigarettes, Smokeless Tobacco, Cigars, and/or Pipes) A prescription for an FDA-approved tobacco cessation medication was offered at discharge and the patient refused  Current Medications: Current  Facility-Administered Medications  Medication Dose Route Frequency Provider Last Rate Last Admin  . gabapentin (NEURONTIN) capsule 300 mg  300 mg Oral TID Patrecia Pour, NP   Stopped at 02/19/20 2239  . hydrOXYzine (ATARAX/VISTARIL) tablet 25 mg  25 mg Oral Q6H PRN Patrecia Pour, NP      . loperamide (IMODIUM) capsule 2-4 mg  2-4 mg Oral PRN Patrecia Pour, NP      . LORazepam (ATIVAN) tablet 1 mg  1 mg Oral Q6H PRN Patrecia Pour, NP      . LORazepam (ATIVAN) tablet 1 mg  1 mg Oral QID Patrecia Pour, NP   Stopped at 02/19/20 2240   Followed by  . LORazepam (ATIVAN) tablet 1 mg  1 mg Oral TID Patrecia Pour, NP       Followed by  . [START ON 02/21/2020] LORazepam (ATIVAN) tablet 1 mg  1 mg Oral BID Patrecia Pour, NP       Followed by  . [START ON 02/23/2020] LORazepam (ATIVAN) tablet 1 mg  1 mg Oral Daily Lord, Jamison Y, NP      . multivitamin with minerals tablet 1 tablet  1 tablet Oral Daily Lord, Jamison Y, NP      . OLANZapine (ZYPREXA) tablet 2.5 mg  2.5 mg Oral BID Patrecia Pour, NP      . ondansetron (ZOFRAN-ODT) disintegrating tablet 4 mg  4 mg Oral Q6H PRN Patrecia Pour, NP      . thiamine (B-1) injection 100 mg  100 mg Intramuscular Once Patrecia Pour, NP      . thiamine tablet 100 mg  100 mg Oral Daily Patrecia Pour, NP       Current Outpatient Medications  Medication Sig Dispense Refill  . vitamin C (ASCORBIC ACID) 250 MG tablet Take 250 mg by mouth daily.     PTA Medications: (Not in a hospital admission)   Musculoskeletal: Strength & Muscle Tone: within normal limits Gait & Station: normal Patient leans: N/A  Psychiatric Specialty Exam: Physical Exam Vitals and nursing note reviewed.  Constitutional:      Appearance: Normal appearance.  HENT:     Head: Normocephalic.     Nose: Nose normal.  Cardiovascular:     Rate and Rhythm: Normal rate.  Musculoskeletal:        General: Normal range of motion.  Neurological:     General: No focal deficit  present.     Mental Status: She is alert and oriented to person, place, and time.  Psychiatric:        Attention and Perception: Attention and perception normal.        Mood and Affect: Mood and affect normal.        Speech: Speech normal.        Behavior: Behavior normal. Behavior is cooperative.  Thought Content: Thought content normal.        Cognition and Memory: Cognition and memory normal.        Judgment: Judgment normal.     Review of Systems  Psychiatric/Behavioral: The patient is nervous/anxious.   All other systems reviewed and are negative.   Blood pressure 105/70, pulse 70, temperature 98.4 F (36.9 C), temperature source Oral, resp. rate 16, height 5\' 7"  (1.702 m), weight 68 kg, last menstrual period 02/16/2020, SpO2 98 %.Body mass index is 23.49 kg/m.  General Appearance: Casual  Eye Contact:  Good  Speech:  Normal Rate  Volume:  Normal  Mood:  Anxious, mild  Affect:  Congruent  Thought Process:  Coherent and Descriptions of Associations: Intact  Orientation:  Full (Time, Place, and Person)  Thought Content:  WDL and Logical  Suicidal Thoughts:  No  Homicidal Thoughts:  No  Memory:  Immediate;   Good Recent;   Good Remote;   Good  Judgement:  Fair  Insight:  Fair  Psychomotor Activity:  Normal  Concentration:  Concentration: Good and Attention Span: Good  Recall:  Good  Fund of Knowledge:  Fair  Language:  Good  Akathisia:  No  Handed:  Right  AIMS (if indicated):     Assets:  Leisure Time Physical Health Resilience Social Support  ADL's:  Intact  Cognition:  WNL  Sleep:        Demographic Factors:  Caucasian  Loss Factors: NA  Historical Factors: NA  Risk Reduction Factors:   Sense of responsibility to family, Living with another person, especially a relative, Positive social support and Positive therapeutic relationship  Continued Clinical Symptoms:  Anxiety, mild  Cognitive Features That Contribute To Risk:  None     Suicide Risk:  Minimal: No identifiable suicidal ideation.  Patients presenting with no risk factors but with morbid ruminations; may be classified as minimal risk based on the severity of the depressive symptoms   Plan Of Care/Follow-up recommendations:  Activity:  As tolerated Diet:  Heart healthy  Disposition: Discharge home  Waylan Boga, NP 02/20/2020, 1:52 PM   Case discussed and plan agreed upon as outlined above by nurse practitioner Reita Cliche.

## 2020-02-20 NOTE — BH Assessment (Signed)
Writer spoke with the patient to complete an updated/reassessment. Patient denies SI/HI and AV/H. Obtain collateral information from her boyfriend and he reports of having no concerns of her harming herself or anyone else.  Patient no longer meet inpatient criteria.

## 2020-02-20 NOTE — Discharge Instructions (Signed)
Psychosis Psychosis, also called thought disturbance, refers to a severe loss of contact with reality. People having a psychotic episode are not able to think clearly, and their emotions and responses do not match with what is actually happening. People having a psychotic episode may have false beliefs about what is happening or who they are (delusions). They may see, hear, taste, smell, or feel things that are not present (hallucinations). They may also be very upset (agitated), have chaotic behavior, or be very quiet and withdrawn. What are the causes? This condition may be caused by:  Very serious mental health (psychiatric) conditions such as schizophrenia, bipolar disorder, or major depression.  Use of drugs such as hallucinogens or alcohol.  Medical conditions such as delirium or neurological disorders. What are the signs or symptoms? Symptoms of this condition include:  Delusions, such as: ? Feeling a lot of fear or suspicion (paranoia). ? Believing something that is odd, unrealistic, or false, such as believing that you are someone else.  Hallucinations, such as: ? Hearing or seeing things, smelling odors, experiencing tastes, or feeling bodily sensations. ? Command hallucinations that direct you to do something that could be dangerous.  Disorganized thinking, such as thoughts that jump from one idea to another in a way that does not make sense.  Disorganized speech, such as saying things that do not make sense, echoing others, or using words based on their sound rather than their meaning.  Inappropriate behavior, such as talking to yourself, showing a clear increase or decrease in activity, or intruding on unfamiliar people. How is this diagnosed? This condition is diagnosed based on an assessment by a health care provider.  The health care provider may ask questions about: ? Your thoughts, feelings, and behavior. ? Any medical conditions you have. ? Any use of alcohol or  drugs.  One or more of the following may also be done: ? A physical exam. ? Blood tests. ? Brain imaging, such as a CT scan or MRI. ? A brain wave study (electroencephalogram, or EEG). The health care provider may refer you to a mental health professional for further tests. How is this treated? Treatment for this condition may depend on the cause of the psychosis. Treatment may include one or more of the following:  Supportive care and monitoring in the emergency room or hospital. You may need to stay in the hospital if you are a danger to yourself or others.  Taking antipsychotic medicines to reduce symptoms and to balance chemicals in the brain.  Treating an underlying medical condition.  Stopping or reducing drugs that are causing psychosis.  Therapy and other supportive programs, such as: ? Ongoing treatment and care from a mental health professional. ? Individual or family therapy. ? Training to learn new skills to cope with the psychosis and prevent further episodes. Follow these instructions at home:  Take over-the-counter and prescription medicines only as told by your health care provider.  Consult a health care provider before taking over-the-counter medicines, herbs, or supplements.  Surround yourself with people who care about you and can help manage your condition.  Keep stress under control. Stress may trigger psychosis and make symptoms worse.  Maintain a healthy lifestyle. This includes: ? Eating a healthy diet. ? Getting enough sleep. ? Exercising regularly. ? Avoiding alcohol, nicotine, and recreational drugs.  Keep all follow-up visits as told by your health care provider. This is important. Contact a health care provider if:  Medicines do not seem to be helping.    You or others notice that you: ? Continue to see, smell, or feel things that are not there. ? Hear voices telling you to do things. ? Feel extremely fearful and suspicious that someone or  something will harm you. ? Feel unable to leave your house. ? Have trouble taking care of yourself.  You have side effects of medicines, such as: ? Changes in sleep patterns. ? Dizziness. ? Weight gain. ? Restlessness. ? Movement changes. ? Shaking that you cannot control (tremors). Get help right away if:  You have serious side effects of medicine, such as: ? Swelling of the face, lips, tongue, or throat. ? Fever, confusion, muscle spasms, or seizures.  You have serious thoughts about harming yourself or hurting others. If you ever feel like you may hurt yourself or others, or have thoughts about taking your own life, get help right away. You can go to your nearest emergency department or call:  Your local emergency services (911 in the [place]  A suicide crisis helpline, such as the National Suicide Prevention Lifeline at 1-800-273-8255. This is open 24 hours a day. Summary  Psychosis refers to a severe loss of contact with reality. People having a psychotic episode are not able to think clearly, and they may have delusions or hallucinations.  Psychosis is a serious medical condition that should be treated by a medical professional as soon as possible. Being checked and treated right away can stop or reduce symptoms. This prevents more serious problems from developing.  In some cases, treatment may include taking antipsychotic medicines to reduce symptoms and to balance chemicals in the brain.  Support programs may help you learn new skills to cope with the psychosis and prevent further episodes. This information is not intended to replace advice given to you by your health care provider. Make sure you discuss any questions you have with your health care provider. Document Revised: 02/27/2018 Document Reviewed: 01/27/2018 Elsevier Patient Education  2020 [employer]  

## 2020-02-22 LAB — ETHANOL: Alcohol, Ethyl (B): <10 mg/dL (ref <10)

## 2020-04-01 ENCOUNTER — Encounter (HOSPITAL_COMMUNITY): Payer: Self-pay

## 2020-04-01 ENCOUNTER — Other Ambulatory Visit: Payer: Self-pay

## 2020-04-01 ENCOUNTER — Emergency Department (HOSPITAL_COMMUNITY)
Admission: EM | Admit: 2020-04-01 | Discharge: 2020-04-01 | Discharge status: Against Medical Advice | Payer: BLUE CROSS/BLUE SHIELD | Attending: Emergency Medicine | Admitting: Emergency Medicine

## 2020-04-01 DIAGNOSIS — R109 Unspecified abdominal pain: Secondary | ICD-10-CM | POA: Diagnosis not present

## 2020-04-01 DIAGNOSIS — F1210 Cannabis abuse, uncomplicated: Secondary | ICD-10-CM | POA: Diagnosis not present

## 2020-04-01 DIAGNOSIS — F22 Delusional disorders: Secondary | ICD-10-CM | POA: Diagnosis present

## 2020-04-01 DIAGNOSIS — Z5320 Procedure and treatment not carried out because of patient's decision for unspecified reasons: Secondary | ICD-10-CM | POA: Diagnosis not present

## 2020-04-01 DIAGNOSIS — F17210 Nicotine dependence, cigarettes, uncomplicated: Secondary | ICD-10-CM | POA: Diagnosis not present

## 2020-04-01 LAB — COMPREHENSIVE METABOLIC PANEL
ALT: 17 U/L (ref 0–44)
AST: 18 U/L (ref 15–41)
Albumin: 4.3 g/dL (ref 3.5–5.0)
Alkaline Phosphatase: 45 U/L (ref 38–126)
Anion gap: 6 (ref 5–15)
BUN: 14 mg/dL (ref 6–20)
CO2: 26 mmol/L (ref 22–32)
Calcium: 9 mg/dL (ref 8.9–10.3)
Chloride: 103 mmol/L (ref 98–111)
Creatinine, Ser: 0.7 mg/dL (ref 0.44–1.00)
GFR calc Af Amer: >60 mL/min (ref >60)
GFR calc non Af Amer: >60 mL/min (ref >60)
Glucose, Bld: 130 mg/dL — ABNORMAL HIGH (ref 70–99)
Potassium: 4 mmol/L (ref 3.5–5.1)
Sodium: 135 mmol/L (ref 135–145)
Total Bilirubin: 0.6 mg/dL (ref 0.3–1.2)
Total Protein: 7.5 g/dL (ref 6.5–8.1)

## 2020-04-01 LAB — CBC
HCT: 42.8 % (ref 36.0–46.0)
Hemoglobin: 14.1 g/dL (ref 12.0–15.0)
MCH: 30.8 pg (ref 26.0–34.0)
MCHC: 32.9 g/dL (ref 30.0–36.0)
MCV: 93.4 fL (ref 80.0–100.0)
Platelets: 466 10*3/uL — ABNORMAL HIGH (ref 150–400)
RBC: 4.58 MIL/uL (ref 3.87–5.11)
RDW: 13.1 % (ref 11.5–15.5)
WBC: 6.6 10*3/uL (ref 4.0–10.5)
nRBC: 0 % (ref 0.0–0.2)

## 2020-04-01 LAB — ETHANOL: Alcohol, Ethyl (B): <10 mg/dL (ref <10)

## 2020-04-01 LAB — PROTIME-INR
INR: 1 (ref 0.8–1.2)
Prothrombin Time: 13.2 seconds (ref 11.4–15.2)
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
## 2020-04-01 NOTE — ED Notes (Signed)
Pt moved to room 15. Pt states she needs to leave. Dr Alvino Chapel notified and states pt can go. Pt ambulatory off unit.
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
## 2020-04-01 NOTE — ED Notes (Signed)
Pt informed need for urine sample. Pt denies need to use restroom at this time.
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
## 2020-11-30 ENCOUNTER — Emergency Department
Admission: EM | Admit: 2020-11-30 | Discharge: 2020-11-30 | Disposition: A | Discharge status: Home / Self Care | Payer: BLUE CROSS/BLUE SHIELD | Attending: Student in an Organized Health Care Education/Training Program | Admitting: Student in an Organized Health Care Education/Training Program

## 2020-11-30 ENCOUNTER — Other Ambulatory Visit: Payer: Self-pay

## 2020-11-30 DIAGNOSIS — F17210 Nicotine dependence, cigarettes, uncomplicated: Secondary | ICD-10-CM | POA: Insufficient documentation

## 2020-11-30 DIAGNOSIS — J45909 Unspecified asthma, uncomplicated: Secondary | ICD-10-CM | POA: Insufficient documentation

## 2020-11-30 DIAGNOSIS — R5381 Other malaise: Secondary | ICD-10-CM

## 2020-11-30 LAB — URINE DRUG SCREEN, QUALITATIVE (ARMC ONLY)
Amphetamines, Ur Screen: POSITIVE — AB
Barbiturates, Ur Screen: NOT DETECTED
Benzodiazepine, Ur Scrn: POSITIVE — AB
Cannabinoid 50 Ng, Ur ~~LOC~~: NOT DETECTED
Cocaine Metabolite,Ur ~~LOC~~: NOT DETECTED
MDMA (Ecstasy)Ur Screen: NOT DETECTED
Methadone Scn, Ur: NOT DETECTED
Opiate, Ur Screen: NOT DETECTED
Phencyclidine (PCP) Ur S: NOT DETECTED
Tricyclic, Ur Screen: NOT DETECTED

## 2020-11-30 LAB — CBC WITH DIFFERENTIAL/PLATELET
Abs Immature Granulocytes: 0.02 10*3/uL (ref 0.00–0.07)
Basophils Absolute: 0 10*3/uL (ref 0.0–0.1)
Basophils Relative: 1 %
Eosinophils Absolute: 0.1 10*3/uL (ref 0.0–0.5)
Eosinophils Relative: 1 %
HCT: 43.6 % (ref 36.0–46.0)
Hemoglobin: 14.7 g/dL (ref 12.0–15.0)
Immature Granulocytes: 0 %
Lymphocytes Relative: 26 %
Lymphs Abs: 1.8 10*3/uL (ref 0.7–4.0)
MCH: 31 pg (ref 26.0–34.0)
MCHC: 33.7 g/dL (ref 30.0–36.0)
MCV: 92 fL (ref 80.0–100.0)
Monocytes Absolute: 0.5 10*3/uL (ref 0.1–1.0)
Monocytes Relative: 8 %
Neutro Abs: 4.3 10*3/uL (ref 1.7–7.7)
Neutrophils Relative %: 64 %
Platelets: 494 10*3/uL — ABNORMAL HIGH (ref 150–400)
RBC: 4.74 MIL/uL (ref 3.87–5.11)
RDW: 13.8 % (ref 11.5–15.5)
WBC: 6.7 10*3/uL (ref 4.0–10.5)
nRBC: 0 % (ref 0.0–0.2)

## 2020-11-30 LAB — GASTROINTESTINAL PANEL BY PCR, STOOL (REPLACES STOOL CULTURE)

## 2020-11-30 LAB — COMPREHENSIVE METABOLIC PANEL
ALT: 201 U/L — ABNORMAL HIGH (ref 0–44)
AST: 93 U/L — ABNORMAL HIGH (ref 15–41)
Albumin: 4 g/dL (ref 3.5–5.0)
Alkaline Phosphatase: 82 U/L (ref 38–126)
Anion gap: 11 (ref 5–15)
BUN: 10 mg/dL (ref 6–20)
CO2: 25 mmol/L (ref 22–32)
Calcium: 9.2 mg/dL (ref 8.9–10.3)
Chloride: 103 mmol/L (ref 98–111)
Creatinine, Ser: 0.69 mg/dL (ref 0.44–1.00)
GFR, Estimated: >60 mL/min (ref >60)
Glucose, Bld: 112 mg/dL — ABNORMAL HIGH (ref 70–99)
Potassium: 4.2 mmol/L (ref 3.5–5.1)
Sodium: 139 mmol/L (ref 135–145)
Total Bilirubin: 0.6 mg/dL (ref 0.3–1.2)
Total Protein: 7.7 g/dL (ref 6.5–8.1)

## 2020-11-30 LAB — URINALYSIS, COMPLETE (UACMP) WITH MICROSCOPIC
Bacteria, UA: NONE SEEN
Bilirubin Urine: NEGATIVE
Glucose, UA: NEGATIVE mg/dL
Ketones, ur: NEGATIVE mg/dL
Leukocytes,Ua: NEGATIVE
Nitrite: NEGATIVE
Protein, ur: NEGATIVE mg/dL
Specific Gravity, Urine: 1.011 (ref 1.005–1.030)
pH: 5 (ref 5.0–8.0)

## 2020-11-30 LAB — PREGNANCY, URINE: Preg Test, Ur: NEGATIVE

## 2020-11-30 LAB — ACETAMINOPHEN LEVEL: Acetaminophen (Tylenol), Serum: <10 ug/mL — ABNORMAL LOW (ref 10–30)

## 2020-11-30 MED ORDER — CEPHALEXIN 500 MG PO CAPS: 500.0000 mg | ORAL_CAPSULE | Freq: Three times a day (TID) | ORAL | 0 refills | Status: AC

## 2020-11-30 NOTE — ED Notes (Signed)
Pt here  Possible infection. Pt states  Drinks same water as dog who recently died.

## 2020-11-30 NOTE — ED Provider Notes (Signed)
[facility] Emergency Department Provider Note    First MD Initiated Contact with Patient 11/30/20 1246     (approximate)  I have reviewed the triage vital signs and the nursing notes.   HISTORY  Chief Complaint possible infection    HPI Taylor Wiggins is a 39 y.o. female the below listed past medical history presents to the ER for evaluation of generalized malaise.  States that she is worried that she contracted parasitic infection as her dog recently died.  She has been living on well water.  Recently moved to the area from [facility].  Had recent work-up and was told that she may have a kidney infection.  Was put on Keflex.  She denies any fevers.  No nausea or vomiting.  Just feels generalized fatigue.    Past Medical History:  Diagnosis Date  . ADHD   . Adult ADHD   . Anxiety   . Asthma   . IBS (irritable bowel syndrome)   . Opioid use disorder    on agonist therapy as of 10/2018 note by prior PCP  . PTSD (post-traumatic stress disorder)    No family history on file. Past Surgical History:  Procedure Laterality Date  . BREAST REDUCTION SURGERY  2005   Patient Active Problem List   Diagnosis Date Noted  . 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 Opiate addiction ([place]) 04/29/2018  . Cellulitis of right hand 04/29/2018  . Tobacco abuse 04/29/2018  . Anxiety disorder 04/29/2018  . Alcohol dependence ([place]) 04/28/2013      Prior to Admission medications   Medication Sig Start Date End Date Taking? Authorizing Provider  cephALEXin (KEFLEX) 500 MG capsule Take 1 capsule (500 mg total) by mouth 3 (three) times daily for 5 days. 11/30/20 12/05/20  Merlyn Lot, MD  vitamin C (ASCORBIC ACID) 250 MG tablet Take 250 mg by mouth daily.    [provider]    Allergies Tramadol and Mold extract [trichophyton]    Social History Social History   Tobacco Use  . Smoking status: Current Every  Day Smoker    Packs/day: 1.00    Types: Cigarettes    Last attempt to quit: 09/06/2014    Years since quitting: 6.2  . Smokeless tobacco: Never Used  Substance Use Topics  . Alcohol use: No  . Drug use: Yes    Types: Heroin, Benzodiazepines, Marijuana, Methamphetamines    Comment: meth 2 days ago    Review of Systems Patient denies headaches, rhinorrhea, blurry vision, numbness, shortness of breath, chest pain, edema, cough, abdominal pain, nausea, vomiting, diarrhea, dysuria, fevers, rashes or hallucinations unless otherwise stated above in HPI. ____________________________________________   PHYSICAL EXAM:  VITAL SIGNS: Vitals:   11/30/20 1044 11/30/20 1253  BP: 112/74 115/75  Pulse: 96 96  Resp: 16 17  Temp: 98.5 F (36.9 C)   SpO2: 100% 100%    Constitutional: Alert and oriented.  Eyes: Conjunctivae are normal.  Head: Atraumatic. Nose: No congestion/rhinnorhea. Mouth/Throat: Mucous membranes are moist.   Neck: No stridor. Painless ROM.  Cardiovascular: Normal rate, regular rhythm. Grossly normal heart sounds.  Good peripheral circulation. Respiratory: Normal respiratory effort.  No retractions. Lungs CTAB. Gastrointestinal: Soft and nontender. No distention. No abdominal bruits. No CVA tenderness. Genitourinary:  Musculoskeletal: No lower extremity tenderness nor edema.  No joint effusions. Neurologic:  Normal speech and language. No gross focal neurologic deficits are appreciated. No facial droop Skin:  Skin is warm, dry and intact.  No rash noted. Psychiatric: Mood and affect are normal. Speech and behavior are normal.  ____________________________________________   LABS (all labs ordered are listed, but only abnormal results are displayed)  Results for orders placed or performed during the hospital encounter of 11/30/20 (from the past 24 hour(s))  Comprehensive metabolic panel     Status: Abnormal   Collection Time: 11/30/20 11:00 AM  Result Value Ref Range    Sodium 139 135 - 145 mmol/L   Potassium 4.2 3.5 - 5.1 mmol/L   Chloride 103 98 - 111 mmol/L   CO2 25 22 - 32 mmol/L   Glucose, Bld 112 (H) 70 - 99 mg/dL   BUN 10 6 - 20 mg/dL   Creatinine, Ser 0.69 0.44 - 1.00 mg/dL   Calcium 9.2 8.9 - 10.3 mg/dL   Total Protein 7.7 6.5 - 8.1 g/dL   Albumin 4.0 3.5 - 5.0 g/dL   AST 93 (H) 15 - 41 U/L   ALT 201 (H) 0 - 44 U/L   Alkaline Phosphatase 82 38 - 126 U/L   Total Bilirubin 0.6 0.3 - 1.2 mg/dL   GFR, Estimated >60 >60 mL/min   Anion gap 11 5 - 15  Acetaminophen level     Status: Abnormal   Collection Time: 11/30/20 11:00 AM  Result Value Ref Range   Acetaminophen (Tylenol), Serum <10 (L) 10 - 30 ug/mL  Urine Drug Screen, Qualitative     Status: Abnormal   Collection Time: 11/30/20 11:00 AM  Result Value Ref Range   Tricyclic, Ur Screen NONE DETECTED NONE DETECTED   Amphetamines, Ur Screen POSITIVE (A) NONE DETECTED   MDMA (Ecstasy)Ur Screen NONE DETECTED NONE DETECTED   Cocaine Metabolite,Ur [place] NONE DETECTED NONE DETECTED   Opiate, Ur Screen NONE DETECTED NONE DETECTED   Phencyclidine (PCP) Ur S NONE DETECTED NONE DETECTED   Cannabinoid 50 Ng, Ur [place] NONE DETECTED NONE DETECTED   Barbiturates, Ur Screen NONE DETECTED NONE DETECTED   Benzodiazepine, Ur Scrn POSITIVE (A) NONE DETECTED   Methadone Scn, Ur NONE DETECTED NONE DETECTED  Urinalysis, Complete w Microscopic     Status: Abnormal   Collection Time: 11/30/20 11:00 AM  Result Value Ref Range   Color, Urine YELLOW (A) YELLOW   APPearance CLOUDY (A) CLEAR   Specific Gravity, Urine 1.011 1.005 - 1.030   pH 5.0 5.0 - 8.0   Glucose, UA NEGATIVE NEGATIVE mg/dL   Hgb urine dipstick SMALL (A) NEGATIVE   Bilirubin Urine NEGATIVE NEGATIVE   Ketones, ur NEGATIVE NEGATIVE mg/dL   Protein, ur NEGATIVE NEGATIVE mg/dL   Nitrite NEGATIVE NEGATIVE   Leukocytes,Ua NEGATIVE NEGATIVE   RBC / HPF 0-5 0 - 5 RBC/hpf   WBC, UA 0-5 0 - 5 WBC/hpf   Bacteria, UA NONE SEEN NONE SEEN   Squamous  Epithelial / LPF 21-50 0 - 5   Mucus PRESENT   Pregnancy, urine     Status: None   Collection Time: 11/30/20 11:00 AM  Result Value Ref Range   Preg Test, Ur NEGATIVE NEGATIVE  CBC with Differential     Status: Abnormal   Collection Time: 11/30/20 11:00 AM  Result Value Ref Range   WBC 6.7 4.0 - 10.5 K/uL   RBC 4.74 3.87 - 5.11 MIL/uL   Hemoglobin 14.7 12.0 - 15.0 g/dL   HCT 43.6 36 - 46 %   MCV 92.0 80.0 - 100.0 fL   MCH 31.0 26.0 - 34.0 pg   MCHC 33.7 30.0 - 36.0 g/dL  RDW 13.8 11.5 - 15.5 %   Platelets 494 (H) 150 - 400 K/uL   nRBC 0.0 0.0 - 0.2 %   Neutrophils Relative % 64 %   Neutro Abs 4.3 1.7 - 7.7 K/uL   Lymphocytes Relative 26 %   Lymphs Abs 1.8 0.7 - 4.0 K/uL   Monocytes Relative 8 %   Monocytes Absolute 0.5 0.1 - 1.0 K/uL   Eosinophils Relative 1 %   Eosinophils Absolute 0.1 0.0 - 0.5 K/uL   Basophils Relative 1 %   Basophils Absolute 0.0 0.0 - 0.1 K/uL   Immature Granulocytes 0 %   Abs Immature Granulocytes 0.02 0.00 - 0.07 K/uL  Gastrointestinal Panel by PCR , Stool     Status: None   Collection Time: 11/30/20 11:00 AM   Specimen: Stool  Result Value Ref Range   Campylobacter species NOT DETECTED NOT DETECTED   Plesimonas shigelloides NOT DETECTED NOT DETECTED   Salmonella species NOT DETECTED NOT DETECTED   Yersinia enterocolitica NOT DETECTED NOT DETECTED   Vibrio species NOT DETECTED NOT DETECTED   Vibrio cholerae NOT DETECTED NOT DETECTED   Enteroaggregative E coli (EAEC) NOT DETECTED NOT DETECTED   Enteropathogenic E coli (EPEC) NOT DETECTED NOT DETECTED   Enterotoxigenic E coli (ETEC) NOT DETECTED NOT DETECTED   Shiga like toxin producing E coli (STEC) NOT DETECTED NOT DETECTED   Shigella/Enteroinvasive E coli (EIEC) NOT DETECTED NOT DETECTED   Cryptosporidium NOT DETECTED NOT DETECTED   Cyclospora cayetanensis NOT DETECTED NOT DETECTED   Entamoeba histolytica NOT DETECTED NOT DETECTED   Giardia lamblia NOT DETECTED NOT DETECTED   Adenovirus  F40/41 NOT DETECTED NOT DETECTED   Astrovirus NOT DETECTED NOT DETECTED   Norovirus GI/GII NOT DETECTED NOT DETECTED   Rotavirus A NOT DETECTED NOT DETECTED   Sapovirus (I, II, IV, and V) NOT DETECTED NOT DETECTED   ____________________________________________ ____________________________________________  RADIOLOGY   ____________________________________________   PROCEDURES  Procedure(s) performed:  Procedures    Critical Care performed: no ____________________________________________   INITIAL IMPRESSION / ASSESSMENT AND PLAN / ED COURSE  Pertinent labs & imaging results that were available during my care of the patient were reviewed by me and considered in my medical decision making (see chart for details).   DDX: Dehydration, enterocolitis, dehydration, hepatitis, cystitis, pyelonephritis  LANGSTON SUMMERFIELD is a 39 y.o. who presents to the ED with presentation as described above.  Patient nontoxic-appearing.  Blood work does show mildly elevated LFTs with normal bilirubin.  She is not jaundiced.  She is otherwise well-appearing noticed sirs criteria.  Review of care everywhere her urinalysis did grow E. coli.  She has asked for a refill on her Keflex as she does not want to go back to the home where her previous partner was to pick up the prescription.  Also review it does appear that she has hep C antibody from 1125.  I will give her a referral to a local PCP as well as GI clinic for further work-up.  She is otherwise well-appearing.  Work-up is otherwise reassuring.  No indication for hospitalization.     The patient was evaluated in Emergency Department today for the symptoms described in the history of present illness. He/she was evaluated in the context of the global COVID-19 pandemic, which necessitated consideration that the patient might be at risk for infection with the SARS-CoV-2 virus that causes COVID-19. Institutional protocols and algorithms that pertain to the  evaluation of patients at risk for COVID-19 are in  a state of rapid change based on information released by regulatory bodies including the CDC and federal and state organizations. These policies and algorithms were followed during the patient's care in the ED.  As part of my medical decision making, I reviewed the following data within the [place] notes reviewed and incorporated, Labs reviewed, notes from prior ED visits and [place] Controlled Substance Database   ____________________________________________   FINAL CLINICAL IMPRESSION(S) / ED DIAGNOSES  Final diagnoses:  Malaise      NEW MEDICATIONS STARTED DURING THIS VISIT:  New Prescriptions   CEPHALEXIN (KEFLEX) 500 MG CAPSULE    Take 1 capsule (500 mg total) by mouth 3 (three) times daily for 5 days.     Note:  This document was prepared using Dragon voice recognition software and may include unintentional dictation errors.    Merlyn Lot, MD 11/30/20 1323

## 2020-11-30 NOTE — Discharge Instructions (Addendum)
Please follow up with GI clinic and PCP.  I have sent a prescription for keflex to the listed pharmacy.

## 2020-11-30 NOTE — ED Triage Notes (Signed)
Pt to ED for "possible parasitic infection".  States her dog recently died from parasite and drinks the same water she does.  Recently was seen by OBGYN and had blood work for possible miscarriage, did not have [place].   Reports low grade fever with "hot and cold" today with night time headaches.   Discussed pt with Dr Cinda Quest

## 2020-12-08 ENCOUNTER — Other Ambulatory Visit: Payer: Self-pay

## 2020-12-12 ENCOUNTER — Ambulatory Visit: Payer: Medicaid Other | Admitting: Gastroenterology

## 2021-01-15 ENCOUNTER — Ambulatory Visit: Payer: Medicaid Other | Admitting: Gastroenterology

## 2021-01-18 ENCOUNTER — Ambulatory Visit: Payer: Medicaid Other | Admitting: Gastroenterology

## 2021-02-12 ENCOUNTER — Ambulatory Visit: Payer: Medicaid Other | Admitting: Gastroenterology

## 2021-04-04 ENCOUNTER — Encounter: Payer: Self-pay | Admitting: Gastroenterology

## 2021-04-04 ENCOUNTER — Ambulatory Visit: Payer: Medicaid Other | Admitting: Gastroenterology

## 2021-07-18 ENCOUNTER — Encounter: Payer: Self-pay | Admitting: Obstetrics and Gynecology

## 2021-08-16 ENCOUNTER — Encounter: Payer: Self-pay | Admitting: *Deleted

## 2021-08-16 ENCOUNTER — Other Ambulatory Visit: Payer: Self-pay

## 2021-08-16 ENCOUNTER — Emergency Department
Admission: EM | Admit: 2021-08-16 | Discharge: 2021-08-16 | Disposition: A | Discharge status: Home / Self Care | Payer: 59 | Attending: Emergency Medicine | Admitting: Emergency Medicine

## 2021-08-16 DIAGNOSIS — J45909 Unspecified asthma, uncomplicated: Secondary | ICD-10-CM | POA: Insufficient documentation

## 2021-08-16 DIAGNOSIS — F17210 Nicotine dependence, cigarettes, uncomplicated: Secondary | ICD-10-CM | POA: Insufficient documentation

## 2021-08-16 DIAGNOSIS — R21 Rash and other nonspecific skin eruption: Secondary | ICD-10-CM | POA: Diagnosis not present

## 2021-08-16 DIAGNOSIS — Z86018 Personal history of other benign neoplasm: Secondary | ICD-10-CM | POA: Diagnosis not present

## 2021-08-16 NOTE — ED Triage Notes (Signed)
Burning rash on face, arms, legs for two years, "started when I lived in a house with mold". Has been using alcohol and tea tree oil without relief. Says the rash is getting worse and the skin is now peeling. Reports that she has a right sided headache, and the headache gets worse when the rash gets worse.

## 2021-08-16 NOTE — ED Provider Notes (Signed)
[facility] Provider Note   ____________________________________________   Event Date/Time   First MD Initiated Contact with Patient 08/16/21 [phone]     (approximate)  I have reviewed the triage vital signs and the nursing notes.   HISTORY  Chief Complaint Rash    HPI Taylor Wiggins is a 40 y.o. female with past medical history of polysubstance abuse and anxiety who presents to the ED complaining of rash.  Patient reports that for the past 2 years she has been having a rash that will occasionally pop up on her arms and her face.  She describes it as small red bumps that will occasionally drain black or white material.  She states that afterwards her skin becomes dry and flaky, she does admit to scratching and picking at these areas in order to get them to drain.  She denies any fevers and has not noticed any vesicles or blistering.  She is concerned that these areas could be due to MRSA or a fungal infection as she states she previously lived in a house with "black mold."  She has been using over-the-counter moisturizing creams along with tea tree oil.  She has never seen a medical provider for this issue.        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   Dermatitis 11/24/2019   Benign neoplasm of skin of right lower extremity 07/06/2019   History of alcohol dependence ([place]) 05/12/2019   Attention deficit hyperactivity disorder (ADHD), 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   Opioid use disorder 06/10/2018   Opiate addiction ([place]) 04/29/2018   Cellulitis of right hand 04/29/2018   Tobacco abuse 04/29/2018   Anxiety disorder  04/29/2018   Screening for breast cancer 07/03/2016   Bipolar disorder ([place]) 11/25/2014   Drug-seeking behavior 11/25/2014   Alcohol dependence ([place]) 04/28/2013    Past Surgical History:  Procedure Laterality Date   BREAST REDUCTION SURGERY  2005    Prior to Admission medications   Medication Sig Start Date End Date Taking? Authorizing Provider  amphetamine-dextroamphetamine (ADDERALL) 30 MG tablet Take 1 tablet by mouth 2 (two) times daily as needed. 11/09/20   [provider]  clonazePAM (KLONOPIN) 1 MG tablet Take 1 tablet by mouth 2 (two) times daily as needed. 12/07/20   [provider]  ibuprofen (ADVIL) 400 MG tablet Take 1 tablet by mouth every 8 (eight) hours as needed. 11/23/20   [provider]  vitamin C (ASCORBIC ACID) 250 MG tablet Take 250 mg by mouth daily.    [provider]    Allergies Tramadol and Mold extract [trichophyton]  No family history on file.  Social History Social History   Tobacco Use   Smoking status: Every Day    Packs/day: 1.00    Types: Cigarettes    Last attempt to quit: 09/06/2014    Years since quitting: 6.9   Smokeless tobacco: Never  Substance Use Topics   Alcohol use: No   Drug use: Yes    Types: Heroin, Benzodiazepines, Marijuana, Methamphetamines    Comment: meth 2 days ago  Review of Systems  Constitutional: No fever/chills Eyes: No visual changes. ENT: No sore throat. Cardiovascular: Denies chest pain. Respiratory: Denies shortness of breath. Gastrointestinal: No abdominal pain.  No nausea, no vomiting.  No diarrhea.  No constipation. Genitourinary: Negative for dysuria. Musculoskeletal: Negative for back pain. Skin: Positive for rash. Neurological: Negative for headaches, focal weakness or numbness.  ____________________________________________   PHYSICAL EXAM:  VITAL SIGNS: ED Triage Vitals  Enc Vitals Group     BP 08/16/21 0359 115/79     Pulse Rate 08/16/21 0359 (!) 110      Resp 08/16/21 0359 16     Temp 08/16/21 0359 98.2 F (36.8 C)     Temp Source 08/16/21 0359 Oral     SpO2 08/16/21 0359 97 %     Weight 08/16/21 0400 140 lb (63.5 kg)     Height 08/16/21 0400 '5\' 7"'$  (1.702 m)     Head Circumference --      Peak Flow --      Pain Score 08/16/21 0400 10     Pain Loc --      Pain Edu? --      Excl. in [place]? --     Constitutional: Alert and oriented. Eyes: Conjunctivae are normal. Head: Atraumatic. Nose: No congestion/rhinnorhea. Mouth/Throat: Mucous membranes are moist. Neck: Normal ROM Cardiovascular: Normal rate, regular rhythm. Grossly normal heart sounds. Respiratory: Normal respiratory effort.  No retractions. Lungs CTAB. Gastrointestinal: Soft and nontender. No distention. Genitourinary: deferred Musculoskeletal: No lower extremity tenderness nor edema. Neurologic:  Normal speech and language. No gross focal neurologic deficits are appreciated. Skin:  Skin is warm, dry and intact.  Excoriated areas to face and bilateral forearms, no erythema, warmth, or drainage noted.  No vesicles or blisters noted. Psychiatric: Mood and affect are normal. Speech and behavior are normal.  ____________________________________________   LABS (all labs ordered are listed, but only abnormal results are displayed)  Labs Reviewed - No data to display   PROCEDURES  Procedure(s) performed (including Critical Care):  Procedures   ____________________________________________   INITIAL IMPRESSION / ASSESSMENT AND PLAN / ED COURSE      40 year old female with past medical history of polysubstance abuse and anxiety who presents to the ED complaining of 2 years of intermittent rash to her forearms and face.  She is primarily concerned for infectious process such as MRSA, however there is no evidence of abscess or cellulitis at this time.  What she is describing sounds most consistent with comedones that she has then scratched with resulting excoriations.   She is appropriate for discharge home with PCP follow-up, is also requesting referral to dermatology, which I have provided.  She was counseled to return to the ED for new worsening symptoms, patient agrees with plan.      ____________________________________________   FINAL CLINICAL IMPRESSION(S) / ED DIAGNOSES  Final diagnoses:  Rash     ED Discharge Orders     None        Note:  This document was prepared using Dragon voice recognition software and may include unintentional dictation errors.    Blake Divine, MD 08/16/21 [phone]

## 2021-09-10 DIAGNOSIS — B182 Chronic viral hepatitis C: Secondary | ICD-10-CM | POA: Insufficient documentation

## 2021-10-03 ENCOUNTER — Encounter: Payer: Self-pay | Admitting: Family

## 2021-11-03 ENCOUNTER — Emergency Department: Payer: Medicaid Other

## 2021-11-03 ENCOUNTER — Other Ambulatory Visit: Payer: Self-pay

## 2021-11-03 ENCOUNTER — Emergency Department
Admission: EM | Admit: 2021-11-03 | Discharge: 2021-11-04 | Disposition: A | Discharge status: Home / Self Care | Payer: Medicaid Other | Attending: Emergency Medicine | Admitting: Emergency Medicine

## 2021-11-03 DIAGNOSIS — R21 Rash and other nonspecific skin eruption: Secondary | ICD-10-CM | POA: Insufficient documentation

## 2021-11-03 DIAGNOSIS — R682 Dry mouth, unspecified: Secondary | ICD-10-CM | POA: Insufficient documentation

## 2021-11-03 DIAGNOSIS — Z5321 Procedure and treatment not carried out due to patient leaving prior to being seen by health care provider: Secondary | ICD-10-CM | POA: Insufficient documentation

## 2021-11-03 DIAGNOSIS — R079 Chest pain, unspecified: Secondary | ICD-10-CM | POA: Insufficient documentation

## 2021-11-03 LAB — CBC
HCT: 39.8 % (ref 36.0–46.0)
Hemoglobin: 13.5 g/dL (ref 12.0–15.0)
MCH: 32.1 pg (ref 26.0–34.0)
MCHC: 33.9 g/dL (ref 30.0–36.0)
MCV: 94.5 fL (ref 80.0–100.0)
Platelets: 412 10*3/uL — ABNORMAL HIGH (ref 150–400)
RBC: 4.21 MIL/uL (ref 3.87–5.11)
RDW: 13.6 % (ref 11.5–15.5)
WBC: 9 10*3/uL (ref 4.0–10.5)
nRBC: 0 % (ref 0.0–0.2)
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
## 2021-11-04 LAB — SALICYLATE LEVEL: Salicylate Lvl: <7 mg/dL — ABNORMAL LOW (ref 7.0–30.0)

## 2021-11-04 LAB — HEPATIC FUNCTION PANEL
ALT: 14 U/L (ref 0–44)
AST: 16 U/L (ref 15–41)
Albumin: 4.1 g/dL (ref 3.5–5.0)
Alkaline Phosphatase: 42 U/L (ref 38–126)
Bilirubin, Direct: <0.1 mg/dL (ref 0.0–0.2)
Total Bilirubin: 0.6 mg/dL (ref 0.3–1.2)
Total Protein: 7.5 g/dL (ref 6.5–8.1)

## 2021-11-04 LAB — BASIC METABOLIC PANEL
Anion gap: 5 (ref 5–15)
BUN: 8 mg/dL (ref 6–20)
CO2: 25 mmol/L (ref 22–32)
Calcium: 9.2 mg/dL (ref 8.9–10.3)
Chloride: 106 mmol/L (ref 98–111)
Creatinine, Ser: 0.85 mg/dL (ref 0.44–1.00)
GFR, Estimated: >60 mL/min (ref >60)
Glucose, Bld: 97 mg/dL (ref 70–99)
Potassium: 4.4 mmol/L (ref 3.5–5.1)
Sodium: 136 mmol/L (ref 135–145)

## 2021-11-04 LAB — TROPONIN I (HIGH SENSITIVITY): Troponin I (High Sensitivity): <2 ng/L (ref <18)

## 2021-11-04 LAB — ACETAMINOPHEN LEVEL: Acetaminophen (Tylenol), Serum: <10 ug/mL — ABNORMAL LOW (ref 10–30)

## 2021-11-04 NOTE — ED Notes (Signed)
Not visualized in waiting room. Called with no answer.

## 2021-12-04 ENCOUNTER — Ambulatory Visit: Payer: 59 | Admitting: Gastroenterology

## 2022-03-15 ENCOUNTER — Ambulatory Visit: Payer: Medicaid Other | Admitting: Family

## 2022-05-03 ENCOUNTER — Emergency Department (HOSPITAL_COMMUNITY)
Admission: EM | Admit: 2022-05-03 | Discharge: 2022-05-03 | Disposition: A | Discharge status: Home / Self Care | Payer: Medicaid Other | Attending: Emergency Medicine | Admitting: Emergency Medicine

## 2022-05-03 ENCOUNTER — Other Ambulatory Visit: Payer: Self-pay

## 2022-05-03 ENCOUNTER — Encounter (HOSPITAL_COMMUNITY): Payer: Self-pay | Admitting: Emergency Medicine

## 2022-05-03 DIAGNOSIS — F1910 Other psychoactive substance abuse, uncomplicated: Secondary | ICD-10-CM | POA: Insufficient documentation

## 2022-05-03 DIAGNOSIS — Y900 Blood alcohol level of less than 20 mg/100 ml: Secondary | ICD-10-CM | POA: Insufficient documentation

## 2022-05-03 DIAGNOSIS — F1510 Other stimulant abuse, uncomplicated: Secondary | ICD-10-CM

## 2022-05-03 LAB — CBC
HCT: 40.2 % (ref 36.0–46.0)
Hemoglobin: 13.7 g/dL (ref 12.0–15.0)
MCH: 31.9 pg (ref 26.0–34.0)
MCHC: 34.1 g/dL (ref 30.0–36.0)
MCV: 93.7 fL (ref 80.0–100.0)
Platelets: 383 10*3/uL (ref 150–400)
RBC: 4.29 MIL/uL (ref 3.87–5.11)
RDW: 13.2 % (ref 11.5–15.5)
WBC: 6.6 10*3/uL (ref 4.0–10.5)
nRBC: 0 % (ref 0.0–0.2)

## 2022-05-03 LAB — COMPREHENSIVE METABOLIC PANEL
ALT: 19 U/L (ref 0–44)
AST: 17 U/L (ref 15–41)
Albumin: 4.1 g/dL (ref 3.5–5.0)
Alkaline Phosphatase: 43 U/L (ref 38–126)
Anion gap: 6 (ref 5–15)
BUN: 14 mg/dL (ref 6–20)
CO2: 26 mmol/L (ref 22–32)
Calcium: 9.2 mg/dL (ref 8.9–10.3)
Chloride: 106 mmol/L (ref 98–111)
Creatinine, Ser: 0.76 mg/dL (ref 0.44–1.00)
GFR, Estimated: >60 mL/min (ref >60)
Glucose, Bld: 103 mg/dL — ABNORMAL HIGH (ref 70–99)
Potassium: 3.9 mmol/L (ref 3.5–5.1)
Sodium: 138 mmol/L (ref 135–145)
Total Bilirubin: 0.8 mg/dL (ref 0.3–1.2)
Total Protein: 7.4 g/dL (ref 6.5–8.1)

## 2022-05-03 LAB — ETHANOL: Alcohol, Ethyl (B): <10 mg/dL (ref <10)

## 2022-05-03 NOTE — ED Provider Notes (Signed)
?[facility] ?Provider Note ? ? ?CSN: [id number] ?Arrival date & time: 05/03/22  2101 ? ?  ? ?History ? ?Chief Complaint  ?Patient presents with  ? Drug Problem  ? ? ?Taylor Wiggins is a 41 y.o. female. ? ? ?Drug Problem ? ? ?41 year old female, she has a history significant for methamphetamine abuse and has been using frequently recently.  The patient arrives stating that she think she has been poisoned by rat poison because her dog died in the car and she thinks that he was exposed to rat poison though she cannot tell me why.  She currently lives with her husband, they are currently separated but living together in a car, they are homeless and using methamphetamine every day.  The patient reports that she has just felt jittery today and the last few days she felt like she has been bruising easily or than normal. ? ?She denies abdominal pain chest pain coughing or shortness of breath.  The patient appears to be somewhat tangential and paranoid but denies any self-harm or concern for being harmed by others other than feeling like someone has poisoned her ? ?Home Medications ?Prior to Admission medications   ?Medication Sig Start Date End Date Taking? Authorizing Provider  ?amphetamine-dextroamphetamine (ADDERALL) 30 MG tablet Take 1 tablet by mouth 2 (two) times daily as needed. 11/09/20   [provider]  ?clonazePAM (KLONOPIN) 1 MG tablet Take 1 tablet by mouth 2 (two) times daily as needed. 12/07/20   [provider]  ?ibuprofen (ADVIL) 400 MG tablet Take 1 tablet by mouth every 8 (eight) hours as needed. 11/23/20   [provider]  ?vitamin C (ASCORBIC ACID) 250 MG tablet Take 250 mg by mouth daily.    [provider]  ?   ? ?Allergies    ?Tramadol and Mold extract [trichophyton]   ? ?Review of Systems   ?Review of Systems  ?All other systems reviewed and are negative. ? ?Physical Exam ?Updated Vital Signs ?BP (!) 125/93   Pulse 84   Temp 97.7 ?F (36.5 ?C)  (Oral)   Resp 13   Ht 1.702 m ('5\' 7"'$ )   Wt 56.7 kg   LMP 04/10/2022 (Approximate)   SpO2 98%   BMI 19.58 kg/m?  ?Physical Exam ?Vitals and nursing note reviewed.  ?Constitutional:   ?   General: She is not in acute distress. ?   Appearance: She is well-developed.  ?HENT:  ?   Head: Normocephalic and atraumatic.  ?   Mouth/Throat:  ?   Pharynx: No oropharyngeal exudate.  ?Eyes:  ?   General: No scleral icterus.    ?   Right eye: No discharge.     ?   Left eye: No discharge.  ?   Conjunctiva/sclera: Conjunctivae normal.  ?   Pupils: Pupils are equal, round, and reactive to light.  ?Neck:  ?   Thyroid: No thyromegaly.  ?   Vascular: No JVD.  ?Cardiovascular:  ?   Rate and Rhythm: Normal rate and regular rhythm.  ?   Heart sounds: Normal heart sounds. No murmur heard. ?  No friction rub. No gallop.  ?Pulmonary:  ?   Effort: Pulmonary effort is normal. No respiratory distress.  ?   Breath sounds: Normal breath sounds. No wheezing or rales.  ?Abdominal:  ?   General: Bowel sounds are normal. There is no distension.  ?   Palpations: Abdomen is soft. There is no mass.  ?   Tenderness:  There is no abdominal tenderness.  ?Musculoskeletal:     ?   General: No tenderness. Normal range of motion.  ?   Cervical back: Normal range of motion and neck supple.  ?Lymphadenopathy:  ?   Cervical: No cervical adenopathy.  ?Skin: ?   General: Skin is warm and dry.  ?   Findings: No erythema or rash.  ?   Comments: Has been picking on her face and her skin, no infected areas but multiple different picked over scabs  ?Neurological:  ?   Mental Status: She is alert.  ?   Coordination: Coordination normal.  ?   Comments: Awake alert able to move all 4 extremities and follow commands, the patient does not appear to be in any distress and has no focal weakness or cranial nerve deficit 3 through 12  ?Psychiatric:  ?   Comments: The patient exhibits some mild paranoia, she does have some tangential thought and flights of ideas but is able  to be redirected and answer questions.  She does not appear to be responding to any internal stimuli.  She does endorse using methamphetamine daily  ? ? ?ED Results / Procedures / Treatments   ?Labs ?(all labs ordered are listed, but only abnormal results are displayed) ?Labs Reviewed  ?COMPREHENSIVE METABOLIC PANEL - Abnormal; Notable for the following components:  ?    Result Value  ? Glucose, Bld 103 (*)   ? All other components within normal limits  ?CBC  ?ETHANOL  ? ? ?EKG ?None ? ?Radiology ?No results found. ? ?Procedures ?Procedures  ? ? ?Medications Ordered in ED ?Medications - No data to display ? ?ED Course/ Medical Decision Making/ A&P ?  ?                        ?Medical Decision Making ?Amount and/or Complexity of Data Reviewed ?Labs: ordered. ? ? ? ?Patient considers that some of the recent drugs that she is taking did not taste like normal and now she is having some increasing paranoia.  Her vital signs are unremarkable, she is not febrile, she is not tachycardic, she does not appear to be danger to herself or others, she needs reassurance that her blood work is okay which I think is reasonable and then discharge. ? ?Labs unremarkable, vital signs unremarkable, patient stable for discharge ? ? ? ? ? ? ? ?Final Clinical Impression(s) / ED Diagnoses ?Final diagnoses:  ?Methamphetamine abuse ([place])  ? ? ?Rx / DC Orders ?ED Discharge Orders   ? ? None  ? ?  ? ? ?  ?Noemi Chapel, MD ?05/03/22 2322 ? ?

## 2022-05-03 NOTE — ED Notes (Addendum)
Patient removed cardiac monitoring equipment; ED provider at patient bedside. ?

## 2022-05-03 NOTE — Discharge Instructions (Signed)
Substance Abuse Treatment Programs ° °Intensive Outpatient Programs °[facility]     °[address]      °[place], [place]                   °[phone]      ° °[facility] °[address] °[place], [place] °[phone] ° °[facility]     °(Inpatient and outpatient)     °[address]           °[phone]   ° °[facility] °[phone] (Suboxone and Methadone) ° °[address]      °[place], [place] [address]      °[phone]      ° °[address] °[place], [place] °[phone] ° °[facility] (Outpatient/Inpatient, [facility])    °(insurance only) [phone]      °       °[facility] (Groups & Residential) °[place], [place] °[phone] ° °   °[facility]     °[address]     °[place], [place]      °[phone]      ° °[facility] (for caregivers and family) °[address] °[place], [place] °[phone] ° ° ° ° ° °Residential Treatment Programs °[facility]      °[address], [place], [place] [address]  °[phone]      ° °[facility] °[address] [place], [place] [address] °[phone] ° °[facility]        °[phone]      ° °[facility] °[phone] ° °[facility]             °[address]                                         °[place], [place]                                                °[phone] or [phone]                              ° °[facility] [place] °[address] °[place], [address] °[phone] ° °[facility]    °[address]      °[place], [place]     °[phone]      ° °[facility] °[address] °[place], [place] °[phone] ° °[facility]   °[address]     °[place], [place] [address]     °[phone]      °Admissions: 8am-3pm M-F ° °[facility] °[address] °[place],  [place] °[phone] ° °[facility]: Residential Program (90 Days)   °[place], [place]      °336-725-8389 or [phone]    ° °[facility] °[place],  °(Walk in Hours over the weekend or by referral) ° °[facility] °[address], [place],  [address] °([phone] ° °Crisis Mobile: [facility]:  1-877-626-1772 (for crisis response 24 hours a day) °[facility] Hotline:      1-800-256-2452 °Outpatient Psychiatry and Counseling ° °[facility]: Mobile Crisis   Management 24 hours:  1-877-626-1772 ° °[facility] sliding scale fee and walk in schedule: M-F 8am-12pm/1pm-3pm °[address]  °[place], [place] [address] °[phone] ° °[facility] °[address] °[place], [place] [address] °[phone] ° °[facility] ([facility])- new patient walk-in appointments available Monday - Friday 8am -3pm.          °[address] °[place], [place] [address] °336-676-6840 or crisis line- 336-676-6905 ° °[facility] Intensive Outpatient Therapy Program °[address] °[place], [place] [address] °336-832-9804 ° °[facility]                  °Crisis Services      °[phone]      °[address]     °[place], [place] [address]                ° °[facility]   °[facility] °[phone] °[address] °[place], [place] [address] ° ° °[facility]          °[address]  °[place], [place] [address]       °[phone] ° °[facility] °[address] °[place], [place] [address] °336-292-1510 ° °[facility]    °[address]    °[place], [place] [address]     °[phone]      ° °Parish McKinney, MD     °[address]     °[place] [address]     °[phone]     °  °[facility] °[address] °[place] [address] ° °[facility]     °[address]     °[place], [place]      °336-542-2076      ° °[facility] °Shamsher Ahluwalia, MD °[address] °[place], [place] [address] °[phone] ° °[facility]     °[address]     °[place], [place] [address]     °336-274-1237      ° °[facility] °[address] °[place], [place] [address] °336-854-4450 °Resources for Temporary Residential Assistance/Crisis Centers ° °DAY CENTERS °[facility] °M-F 8am-3pm   °[address] [place], [place] [address]   336-332-0824 °Services include: laundry, barbering, support groups, case management, phone  & computer access, showers, AA/NA mtgs, mental health/substance abuse nurse, job skills class, disability information, VA assistance, spiritual classes, etc.  ° °HOMELESS SHELTERS ° °[facility]     °[facility]   °[address], [place]     °[phone]       °       °[facility] (women and children)       °[address] °[place], [place] [address] °336-275-0820 °[email or web address] for application and process °Application Required ° °[facility]   °[address]    °[place] [address]     °[phone]       °             °[facility] °[address] °[place], [place] [address] °[phone] °336-235-0363(schedule application appt.) °Application Required ° °[facility] (women only)    °[address]     °[place], [place] [address]     °336-884-1039      °  Intake starts 6pm daily °Need valid ID, SSC, & Police report °[facility] [place] °[address] °[place], [place] °336-881-5420 °Application Required ° °[facility] (men only)     °[address]      °[place], [place]     °[phone]      ° °[facility] °(Pregnant women only) °[address] °[place], [place] °336-275-0206 ° °[facility]      °[address]      °[place], [place] [address]     °[phone]      °       °[facility] °[address] °[place], [place] °336-723-1848 °90 day commitment/SA/Application process ° °[facility](men only)     °[address]     °[place], [place]     °[phone]       °Check-in at 7pm     °       °[facility] °[address] °[place], [place] [address] °336-248-6684 °Men/Women/Women and Children must be there by 7 pm ° °[facility] °[place], [place] °336-722-8721                ° °

## 2022-05-03 NOTE — ED Triage Notes (Addendum)
Pt to ED via [facility]. Thinks she's been poisoned. Vision is altered. Seeing spots. Left arm numbness. Metallic bitter taste. Sweet smell. Pt "feels off". Pt admitted to snorting an unknown substance this morning.  Pt demanded purse be taken out of the room as pt does not "trust it" and thinks there may be a recording device in it. Pt states she has had SI thoughts without a plan.  ?

## 2022-06-09 ENCOUNTER — Emergency Department (HOSPITAL_BASED_OUTPATIENT_CLINIC_OR_DEPARTMENT_OTHER)
Admission: EM | Admit: 2022-06-09 | Discharge: 2022-06-09 | Disposition: A | Discharge status: Home / Self Care | Payer: 59 | Attending: Emergency Medicine | Admitting: Emergency Medicine

## 2022-06-09 ENCOUNTER — Encounter (HOSPITAL_BASED_OUTPATIENT_CLINIC_OR_DEPARTMENT_OTHER): Payer: Self-pay | Admitting: Emergency Medicine

## 2022-06-09 DIAGNOSIS — F419 Anxiety disorder, unspecified: Secondary | ICD-10-CM | POA: Diagnosis not present

## 2022-06-09 DIAGNOSIS — F909 Attention-deficit hyperactivity disorder, unspecified type: Secondary | ICD-10-CM | POA: Diagnosis not present

## 2022-06-09 DIAGNOSIS — F17210 Nicotine dependence, cigarettes, uncomplicated: Secondary | ICD-10-CM | POA: Diagnosis not present

## 2022-06-09 DIAGNOSIS — J45909 Unspecified asthma, uncomplicated: Secondary | ICD-10-CM | POA: Diagnosis not present

## 2022-06-09 DIAGNOSIS — Z79899 Other long term (current) drug therapy: Secondary | ICD-10-CM | POA: Insufficient documentation

## 2022-06-09 DIAGNOSIS — R112 Nausea with vomiting, unspecified: Secondary | ICD-10-CM | POA: Insufficient documentation

## 2022-06-09 DIAGNOSIS — F1121 Opioid dependence, in remission: Secondary | ICD-10-CM | POA: Diagnosis not present

## 2022-06-09 DIAGNOSIS — R1013 Epigastric pain: Secondary | ICD-10-CM | POA: Insufficient documentation

## 2022-06-09 LAB — CBC WITH DIFFERENTIAL/PLATELET
Abs Immature Granulocytes: 0.01 10*3/uL (ref 0.00–0.07)
Basophils Absolute: 0.1 10*3/uL (ref 0.0–0.1)
Basophils Relative: 1 %
Eosinophils Absolute: 0 10*3/uL (ref 0.0–0.5)
Eosinophils Relative: 0 %
HCT: 39.6 % (ref 36.0–46.0)
Hemoglobin: 13.4 g/dL (ref 12.0–15.0)
Immature Granulocytes: 0 %
Lymphocytes Relative: 21 %
Lymphs Abs: 1.7 10*3/uL (ref 0.7–4.0)
MCH: 31.4 pg (ref 26.0–34.0)
MCHC: 33.8 g/dL (ref 30.0–36.0)
MCV: 92.7 fL (ref 80.0–100.0)
Monocytes Absolute: 0.6 10*3/uL (ref 0.1–1.0)
Monocytes Relative: 7 %
Neutro Abs: 6 10*3/uL (ref 1.7–7.7)
Neutrophils Relative %: 71 %
Platelets: 388 10*3/uL (ref 150–400)
RBC: 4.27 MIL/uL (ref 3.87–5.11)
RDW: 12.9 % (ref 11.5–15.5)
WBC: 8.3 10*3/uL (ref 4.0–10.5)
nRBC: 0 % (ref 0.0–0.2)

## 2022-06-09 LAB — COMPREHENSIVE METABOLIC PANEL
ALT: 43 U/L (ref 0–44)
AST: 29 U/L (ref 15–41)
Albumin: 3.8 g/dL (ref 3.5–5.0)
Alkaline Phosphatase: 48 U/L (ref 38–126)
Anion gap: 7 (ref 5–15)
BUN: 15 mg/dL (ref 6–20)
CO2: 27 mmol/L (ref 22–32)
Calcium: 9.4 mg/dL (ref 8.9–10.3)
Chloride: 104 mmol/L (ref 98–111)
Creatinine, Ser: 0.85 mg/dL (ref 0.44–1.00)
GFR, Estimated: >60 mL/min (ref >60)
Glucose, Bld: 150 mg/dL — ABNORMAL HIGH (ref 70–99)
Potassium: 3.9 mmol/L (ref 3.5–5.1)
Sodium: 138 mmol/L (ref 135–145)
Total Bilirubin: 0.4 mg/dL (ref 0.3–1.2)
Total Protein: 7.1 g/dL (ref 6.5–8.1)

## 2022-06-09 LAB — LIPASE, BLOOD: Lipase: 33 U/L (ref 11–51)

## 2022-06-09 LAB — ETHANOL: Alcohol, Ethyl (B): <10 mg/dL (ref <10)

## 2022-06-09 LAB — HCG, SERUM, QUALITATIVE: Preg, Serum: NEGATIVE

## 2022-06-09 MED ORDER — ONDANSETRON 8 MG PO TBDP
8.0000 mg | ORAL_TABLET | Freq: Three times a day (TID) | ORAL | 0 refills | Status: DC | PRN
Start: 2022-06-09 — End: 2022-06-11

## 2022-06-09 MED ORDER — SUCRALFATE 1 GM/10ML PO SUSP
1.0000 g | Freq: Once | ORAL | Status: AC
  Administered 2022-06-09: 1 g via ORAL
  Filled 2022-06-09: qty 10

## 2022-06-09 MED ORDER — SODIUM CHLORIDE 0.9 % IV BOLUS
1000.0000 mL | Freq: Once | INTRAVENOUS | Status: AC
  Administered 2022-06-09: 1000 mL via INTRAVENOUS

## 2022-06-09 MED ORDER — ONDANSETRON HCL 4 MG/2ML IJ SOLN
4.0000 mg | Freq: Once | INTRAMUSCULAR | Status: AC
  Administered 2022-06-09: 4 mg via INTRAVENOUS
  Filled 2022-06-09: qty 2

## 2022-06-09 MED ORDER — OMEPRAZOLE 20 MG PO CPDR: 20.0000 mg | DELAYED_RELEASE_CAPSULE | Freq: Every day | ORAL | 0 refills | Status: DC

## 2022-06-09 MED ORDER — PANTOPRAZOLE SODIUM 40 MG IV SOLR
40.0000 mg | Freq: Once | INTRAVENOUS | Status: AC
Start: 2022-06-09 — End: 2022-06-09
  Administered 2022-06-09: 40 mg via INTRAVENOUS
  Filled 2022-06-09: qty 10

## 2022-06-09 NOTE — ED Notes (Signed)
Patient also reports "I'm under a lot of stress and can't get in touch with my parents."  Patient reports last drug use was 1 week ago and "I'm not looking to get drugs, I just want to know what's going on."

## 2022-06-09 NOTE — ED Triage Notes (Signed)
Patient reports abdominal pain and vomiting x 12 hours.  Patient has history of pancreatitis and reports it feels the same. Patient reports last time she vomited was 2 hours ago.

## 2022-06-09 NOTE — ED Provider Notes (Signed)
[facility] Provider Note: Taylor Spurling, MD, FACEP  CSN: [id number] MRN: [id number] ARRIVAL: 06/09/22 at [place]: [place]  Abdominal Pain and Vomiting   HISTORY OF PRESENT ILLNESS  06/09/22 4:42 AM Taylor Wiggins is a 41 y.o. female with a history of polysubstance abuse.  She is currently on Adderall and Klonopin for her ADHD and anxiety.  She receives prescriptions for these monthly.  She was previously on buprenorphine for opioid use but has not had any prescriptions since 2022.  She is here with nausea and vomiting that began about 12 hours ago which was subsequently followed by epigastric pain.  She now rates her epigastric pain as an 8 out of 10.  It is worse with movement or palpation.  It is similar to an episode of pancreatitis ("mild") she had about 7 years ago.  She denies diarrhea.  She denies recent alcohol use.  She denies recent opioid use.   Past Medical History:  Diagnosis Date   Adult ADHD    Anxiety    Asthma    IBS (irritable bowel syndrome)    Opioid use disorder    No longer on buprenorphine therapy   PTSD (post-traumatic stress disorder)     Past Surgical History:  Procedure Laterality Date   BREAST REDUCTION SURGERY  2005    History reviewed. No pertinent family history.  Social History   Tobacco Use   Smoking status: Every Day    Packs/day: 1.00    Types: Cigarettes    Last attempt to quit: 09/06/2014    Years since quitting: 7.7   Smokeless tobacco: Never  Substance Use Topics   Alcohol use: No   Drug use: Yes    Types: Benzodiazepines, Marijuana, Methamphetamines    Comment: recently    Prior to Admission medications   Medication Sig Start Date End Date Taking? Authorizing Provider  omeprazole (PRILOSEC) 20 MG capsule Take 1 capsule (20 mg total) by mouth daily. 06/09/22  Yes Tirsa Gail, MD  ondansetron (ZOFRAN-ODT) 8 MG disintegrating tablet Take 1 tablet (8 mg total) by mouth every 8 (eight)  hours as needed. 06/09/22  Yes Moet Mikulski, MD  amphetamine-dextroamphetamine (ADDERALL) 30 MG tablet Take 1 tablet by mouth 2 (two) times daily as needed. 11/09/20   [provider]  clonazePAM (KLONOPIN) 1 MG tablet Take 1 tablet by mouth 2 (two) times daily as needed. 12/07/20   [provider]  vitamin C (ASCORBIC ACID) 250 MG tablet Take 250 mg by mouth daily.    [provider]    Allergies Tramadol and Mold extract [trichophyton]   REVIEW OF SYSTEMS  Negative except as noted here or in the History of Present Illness.   PHYSICAL EXAMINATION  Initial Vital Signs Blood pressure 119/85, pulse 91, temperature 98.4 F (36.9 C), temperature source Oral, resp. rate 20, height '5\' 7"'$  (1.702 m), weight 59 kg, last menstrual period 05/28/2022, SpO2 100 %.  Examination General: Well-developed, thin female in no acute distress; appearance consistent with age of record HENT: normocephalic; atraumatic Eyes: pupils equal, round and reactive to light; extraocular muscles intact Neck: supple Heart: regular rate and rhythm Lungs: clear to auscultation bilaterally Abdomen: soft; nondistended; epigastric tenderness; bowel sounds present Extremities: No deformity; full range of motion; pulses normal Neurologic: Awake, alert and oriented; motor function intact in all extremities and symmetric; no facial droop Skin: Warm and dry Psychiatric: Normal mood and affect   RESULTS  Summary of this visit's  results, reviewed and interpreted by myself:   EKG Interpretation  Date/Time:    Ventricular Rate:    PR Interval:    QRS Duration:   QT Interval:    QTC Calculation:   R Axis:     Text Interpretation:         Laboratory Studies: Results for orders placed or performed during the hospital encounter of 06/09/22 (from the past 24 hour(s))  Lipase, blood     Status: None   Collection Time: 06/09/22  5:01 AM  Result Value Ref Range   Lipase 33 11 - 51 U/L   Comprehensive metabolic panel     Status: Abnormal   Collection Time: 06/09/22  5:01 AM  Result Value Ref Range   Sodium 138 135 - 145 mmol/L   Potassium 3.9 3.5 - 5.1 mmol/L   Chloride 104 98 - 111 mmol/L   CO2 27 22 - 32 mmol/L   Glucose, Bld 150 (H) 70 - 99 mg/dL   BUN 15 6 - 20 mg/dL   Creatinine, Ser 0.85 0.44 - 1.00 mg/dL   Calcium 9.4 8.9 - 10.3 mg/dL   Total Protein 7.1 6.5 - 8.1 g/dL   Albumin 3.8 3.5 - 5.0 g/dL   AST 29 15 - 41 U/L   ALT 43 0 - 44 U/L   Alkaline Phosphatase 48 38 - 126 U/L   Total Bilirubin 0.4 0.3 - 1.2 mg/dL   GFR, Estimated >60 >60 mL/min   Anion gap 7 5 - 15  CBC with Differential     Status: None   Collection Time: 06/09/22  5:01 AM  Result Value Ref Range   WBC 8.3 4.0 - 10.5 K/uL   RBC 4.27 3.87 - 5.11 MIL/uL   Hemoglobin 13.4 12.0 - 15.0 g/dL   HCT 39.6 36.0 - 46.0 %   MCV 92.7 80.0 - 100.0 fL   MCH 31.4 26.0 - 34.0 pg   MCHC 33.8 30.0 - 36.0 g/dL   RDW 12.9 11.5 - 15.5 %   Platelets 388 150 - 400 K/uL   nRBC 0.0 0.0 - 0.2 %   Neutrophils Relative % 71 %   Neutro Abs 6.0 1.7 - 7.7 K/uL   Lymphocytes Relative 21 %   Lymphs Abs 1.7 0.7 - 4.0 K/uL   Monocytes Relative 7 %   Monocytes Absolute 0.6 0.1 - 1.0 K/uL   Eosinophils Relative 0 %   Eosinophils Absolute 0.0 0.0 - 0.5 K/uL   Basophils Relative 1 %   Basophils Absolute 0.1 0.0 - 0.1 K/uL   Immature Granulocytes 0 %   Abs Immature Granulocytes 0.01 0.00 - 0.07 K/uL  hCG, serum, qualitative     Status: None   Collection Time: 06/09/22  5:01 AM  Result Value Ref Range   Preg, Serum NEGATIVE NEGATIVE  Ethanol     Status: None   Collection Time: 06/09/22  5:01 AM  Result Value Ref Range   Alcohol, Ethyl (B) <10 <10 mg/dL   Imaging Studies: No results found.  ED COURSE and MDM  Nursing notes, initial and subsequent vitals signs, including pulse oximetry, reviewed and interpreted by myself.  Vitals:   06/09/22 0435 06/09/22 0439  BP:  119/85  Pulse:  91  Resp:  20  Temp:   98.4 F (36.9 C)  TempSrc:  Oral  SpO2:  100%  Weight: 59 kg   Height: '5\' 7"'$  (1.702 m)    Medications  sodium chloride 0.9 % bolus 1,000 mL (  0 mLs Intravenous Stopped 06/09/22 0603)  ondansetron (ZOFRAN) injection 4 mg (4 mg Intravenous Given 06/09/22 0458)  pantoprazole (PROTONIX) injection 40 mg (40 mg Intravenous Given 06/09/22 0555)  sucralfate (CARAFATE) 1 GM/10ML suspension 1 g (1 g Oral Given 06/09/22 0555)   6:11 AM The patient's lipase is normal and so I doubt pancreatitis.  She states she has a functioning pancreas.  She has not had recurrent pancreatitis and I would expect an elevated lipase if the pancreas was the etiology.  Her pain is epigastric and right upper quadrant so I doubt gallbladder etiology.  I suspect gastritis is the most likely cause.  We will start her on a PPI.   PROCEDURES  Procedures   ED DIAGNOSES     ICD-10-CM   1. Epigastric pain  R10.13     2. Nausea and vomiting in adult  R11.2          Shanon Rosser, MD 06/09/22 [phone]

## 2022-06-10 ENCOUNTER — Ambulatory Visit (HOSPITAL_COMMUNITY)
Admission: EM | Admit: 2022-06-10 | Discharge: 2022-06-11 | Disposition: A | Discharge status: Other Acute Inpatient Hospital | Payer: 59 | Attending: Nurse Practitioner | Admitting: Nurse Practitioner

## 2022-06-10 DIAGNOSIS — R45851 Suicidal ideations: Secondary | ICD-10-CM | POA: Insufficient documentation

## 2022-06-10 DIAGNOSIS — F22 Delusional disorders: Secondary | ICD-10-CM | POA: Diagnosis not present

## 2022-06-10 DIAGNOSIS — Z20822 Contact with and (suspected) exposure to covid-19: Secondary | ICD-10-CM | POA: Insufficient documentation

## 2022-06-10 DIAGNOSIS — F1510 Other stimulant abuse, uncomplicated: Secondary | ICD-10-CM | POA: Insufficient documentation

## 2022-06-10 DIAGNOSIS — Z5902 Unsheltered homelessness: Secondary | ICD-10-CM | POA: Insufficient documentation

## 2022-06-10 DIAGNOSIS — Z9151 Personal history of suicidal behavior: Secondary | ICD-10-CM | POA: Insufficient documentation

## 2022-06-10 DIAGNOSIS — F419 Anxiety disorder, unspecified: Secondary | ICD-10-CM | POA: Insufficient documentation

## 2022-06-10 DIAGNOSIS — F32A Depression, unspecified: Secondary | ICD-10-CM | POA: Insufficient documentation

## 2022-06-10 DIAGNOSIS — F17210 Nicotine dependence, cigarettes, uncomplicated: Secondary | ICD-10-CM | POA: Insufficient documentation

## 2022-06-10 MED ORDER — ALUM & MAG HYDROXIDE-SIMETH 200-200-20 MG/5ML PO SUSP: 30.0000 mL | ORAL | Status: DC | PRN

## 2022-06-10 MED ORDER — MAGNESIUM HYDROXIDE 400 MG/5ML PO SUSP: 30.0000 mL | Freq: Every day | ORAL | Status: DC | PRN

## 2022-06-10 MED ORDER — PANTOPRAZOLE SODIUM 40 MG PO TBEC
40.0000 mg | DELAYED_RELEASE_TABLET | Freq: Every day | ORAL | Status: DC
  Administered 2022-06-11: 40 mg via ORAL
  Filled 2022-06-10: qty 1

## 2022-06-10 MED ORDER — ACETAMINOPHEN 325 MG PO TABS: 650.0000 mg | ORAL_TABLET | Freq: Four times a day (QID) | ORAL | Status: DC | PRN

## 2022-06-10 MED ORDER — HYDROXYZINE HCL 25 MG PO TABS: 25.0000 mg | ORAL_TABLET | Freq: Three times a day (TID) | ORAL | Status: DC | PRN
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
## 2022-06-11 ENCOUNTER — Other Ambulatory Visit: Payer: Self-pay

## 2022-06-11 ENCOUNTER — Encounter (HOSPITAL_COMMUNITY): Payer: Self-pay | Admitting: Psychiatry

## 2022-06-11 ENCOUNTER — Encounter (HOSPITAL_COMMUNITY): Payer: Self-pay | Admitting: Emergency Medicine

## 2022-06-11 ENCOUNTER — Inpatient Hospital Stay (HOSPITAL_COMMUNITY)
Admission: AD | Admit: 2022-06-11 | Discharge: 2022-06-19 | DRG: 885 | Disposition: A | Discharge status: Home / Self Care | Payer: 59 | Source: Intra-hospital | Attending: Psychiatry | Admitting: Psychiatry

## 2022-06-11 DIAGNOSIS — F1111 Opioid abuse, in remission: Secondary | ICD-10-CM | POA: Diagnosis present

## 2022-06-11 DIAGNOSIS — F17210 Nicotine dependence, cigarettes, uncomplicated: Secondary | ICD-10-CM | POA: Diagnosis present

## 2022-06-11 DIAGNOSIS — Z20822 Contact with and (suspected) exposure to covid-19: Secondary | ICD-10-CM | POA: Diagnosis present

## 2022-06-11 DIAGNOSIS — Z720 Tobacco use: Secondary | ICD-10-CM | POA: Diagnosis present

## 2022-06-11 DIAGNOSIS — Z811 Family history of alcohol abuse and dependence: Secondary | ICD-10-CM | POA: Diagnosis not present

## 2022-06-11 DIAGNOSIS — F1220 Cannabis dependence, uncomplicated: Secondary | ICD-10-CM | POA: Diagnosis present

## 2022-06-11 DIAGNOSIS — F332 Major depressive disorder, recurrent severe without psychotic features: Principal | ICD-10-CM

## 2022-06-11 DIAGNOSIS — F419 Anxiety disorder, unspecified: Secondary | ICD-10-CM | POA: Diagnosis not present

## 2022-06-11 DIAGNOSIS — J45909 Unspecified asthma, uncomplicated: Secondary | ICD-10-CM | POA: Diagnosis present

## 2022-06-11 DIAGNOSIS — F1994 Other psychoactive substance use, unspecified with psychoactive substance-induced mood disorder: Secondary | ICD-10-CM | POA: Diagnosis present

## 2022-06-11 DIAGNOSIS — F1410 Cocaine abuse, uncomplicated: Secondary | ICD-10-CM | POA: Diagnosis present

## 2022-06-11 DIAGNOSIS — Z91048 Other nonmedicinal substance allergy status: Secondary | ICD-10-CM | POA: Diagnosis not present

## 2022-06-11 DIAGNOSIS — F4310 Post-traumatic stress disorder, unspecified: Secondary | ICD-10-CM | POA: Diagnosis present

## 2022-06-11 DIAGNOSIS — Z818 Family history of other mental and behavioral disorders: Secondary | ICD-10-CM | POA: Diagnosis not present

## 2022-06-11 DIAGNOSIS — F1510 Other stimulant abuse, uncomplicated: Secondary | ICD-10-CM | POA: Diagnosis present

## 2022-06-11 DIAGNOSIS — Z5900 Homelessness unspecified: Secondary | ICD-10-CM

## 2022-06-11 DIAGNOSIS — F1110 Opioid abuse, uncomplicated: Secondary | ICD-10-CM | POA: Diagnosis present

## 2022-06-11 DIAGNOSIS — F32A Depression, unspecified: Secondary | ICD-10-CM | POA: Diagnosis not present

## 2022-06-11 DIAGNOSIS — F22 Delusional disorders: Secondary | ICD-10-CM

## 2022-06-11 DIAGNOSIS — R45851 Suicidal ideations: Secondary | ICD-10-CM | POA: Diagnosis present

## 2022-06-11 DIAGNOSIS — Z5902 Unsheltered homelessness: Secondary | ICD-10-CM | POA: Diagnosis not present

## 2022-06-11 DIAGNOSIS — Z888 Allergy status to other drugs, medicaments and biological substances status: Secondary | ICD-10-CM | POA: Diagnosis not present

## 2022-06-11 DIAGNOSIS — Z9151 Personal history of suicidal behavior: Secondary | ICD-10-CM | POA: Diagnosis not present

## 2022-06-11 DIAGNOSIS — F1010 Alcohol abuse, uncomplicated: Secondary | ICD-10-CM | POA: Diagnosis present

## 2022-06-11 LAB — COMPREHENSIVE METABOLIC PANEL
ALT: 49 U/L — ABNORMAL HIGH (ref 0–44)
AST: 32 U/L (ref 15–41)
Albumin: 4.3 g/dL (ref 3.5–5.0)
Alkaline Phosphatase: 50 U/L (ref 38–126)
Anion gap: 12 (ref 5–15)
BUN: 9 mg/dL (ref 6–20)
CO2: 26 mmol/L (ref 22–32)
Calcium: 10.2 mg/dL (ref 8.9–10.3)
Chloride: 100 mmol/L (ref 98–111)
Creatinine, Ser: 0.77 mg/dL (ref 0.44–1.00)
GFR, Estimated: >60 mL/min (ref >60)
Glucose, Bld: 92 mg/dL (ref 70–99)
Potassium: 4 mmol/L (ref 3.5–5.1)
Sodium: 138 mmol/L (ref 135–145)
Total Bilirubin: 0.7 mg/dL (ref 0.3–1.2)
Total Protein: 7.7 g/dL (ref 6.5–8.1)

## 2022-06-11 LAB — POC SARS CORONAVIRUS 2 AG: SARSCOV2ONAVIRUS 2 AG: NEGATIVE

## 2022-06-11 LAB — HEMOGLOBIN A1C
Hgb A1c MFr Bld: 5.4 % (ref 4.8–5.6)
Mean Plasma Glucose: 108.28 mg/dL

## 2022-06-11 LAB — LIPID PANEL
Cholesterol: 168 mg/dL (ref 0–200)
HDL: 60 mg/dL (ref >40)
LDL Cholesterol: 87 mg/dL (ref 0–99)
Total CHOL/HDL Ratio: 2.8 RATIO
Triglycerides: 107 mg/dL (ref <150)
VLDL: 21 mg/dL (ref 0–40)

## 2022-06-11 LAB — CBC WITH DIFFERENTIAL/PLATELET
Abs Immature Granulocytes: 0.01 10*3/uL (ref 0.00–0.07)
Basophils Absolute: 0 10*3/uL (ref 0.0–0.1)
Basophils Relative: 1 %
Eosinophils Absolute: 0.1 10*3/uL (ref 0.0–0.5)
Eosinophils Relative: 2 %
HCT: 41.3 % (ref 36.0–46.0)
Hemoglobin: 14.3 g/dL (ref 12.0–15.0)
Immature Granulocytes: 0 %
Lymphocytes Relative: 40 %
Lymphs Abs: 2.6 10*3/uL (ref 0.7–4.0)
MCH: 32.4 pg (ref 26.0–34.0)
MCHC: 34.6 g/dL (ref 30.0–36.0)
MCV: 93.7 fL (ref 80.0–100.0)
Monocytes Absolute: 0.6 10*3/uL (ref 0.1–1.0)
Monocytes Relative: 8 %
Neutro Abs: 3.3 10*3/uL (ref 1.7–7.7)
Neutrophils Relative %: 49 %
Platelets: 390 10*3/uL (ref 150–400)
RBC: 4.41 MIL/uL (ref 3.87–5.11)
RDW: 13 % (ref 11.5–15.5)
WBC: 6.5 10*3/uL (ref 4.0–10.5)
nRBC: 0 % (ref 0.0–0.2)

## 2022-06-11 LAB — TSH: TSH: 1.855 u[IU]/mL (ref 0.350–4.500)

## 2022-06-11 LAB — RESP PANEL BY RT-PCR (FLU A&B, COVID) ARPGX2
Influenza A by PCR: NEGATIVE
Influenza B by PCR: NEGATIVE
SARS Coronavirus 2 by RT PCR: NEGATIVE

## 2022-06-11 MED ORDER — MAGNESIUM HYDROXIDE 400 MG/5ML PO SUSP: 30.0000 mL | Freq: Every day | ORAL | Status: DC | PRN

## 2022-06-11 MED ORDER — ALUM & MAG HYDROXIDE-SIMETH 200-200-20 MG/5ML PO SUSP: 30.0000 mL | ORAL | Status: DC | PRN

## 2022-06-11 MED ORDER — ENSURE ENLIVE PO LIQD
237.0000 mL | Freq: Two times a day (BID) | ORAL | Status: DC
  Administered 2022-06-13 – 2022-06-19 (×9): 237 mL via ORAL
  Filled 2022-06-11 (×17): qty 237

## 2022-06-11 MED ORDER — HYDROXYZINE HCL 25 MG PO TABS
25.0000 mg | ORAL_TABLET | Freq: Three times a day (TID) | ORAL | Status: DC | PRN
  Administered 2022-06-12 – 2022-06-18 (×7): 25 mg via ORAL
  Filled 2022-06-11 (×6): qty 1

## 2022-06-11 MED ORDER — ESCITALOPRAM OXALATE 5 MG PO TABS
5.0000 mg | ORAL_TABLET | Freq: Every day | ORAL | Status: DC
  Administered 2022-06-11: 5 mg via ORAL
  Filled 2022-06-11: qty 1

## 2022-06-11 MED ORDER — PANTOPRAZOLE SODIUM 40 MG PO TBEC
40.0000 mg | DELAYED_RELEASE_TABLET | Freq: Every day | ORAL | Status: DC
  Administered 2022-06-12 – 2022-06-19 (×8): 40 mg via ORAL
  Filled 2022-06-11 (×9): qty 1

## 2022-06-11 MED ORDER — TRAZODONE HCL 50 MG PO TABS
50.0000 mg | ORAL_TABLET | Freq: Every evening | ORAL | Status: DC | PRN
  Filled 2022-06-11: qty 1

## 2022-06-11 MED ORDER — ESCITALOPRAM OXALATE 5 MG PO TABS: 5.0000 mg | ORAL_TABLET | Freq: Every day | ORAL | Status: DC

## 2022-06-11 MED ORDER — ESCITALOPRAM OXALATE 5 MG PO TABS
5.0000 mg | ORAL_TABLET | Freq: Every day | ORAL | Status: DC
  Administered 2022-06-12 – 2022-06-13 (×2): 5 mg via ORAL
  Filled 2022-06-11 (×4): qty 1

## 2022-06-11 MED ORDER — ACETAMINOPHEN 325 MG PO TABS
650.0000 mg | ORAL_TABLET | Freq: Four times a day (QID) | ORAL | Status: DC | PRN
  Administered 2022-06-12: 650 mg via ORAL
  Filled 2022-06-11: qty 2

## 2022-06-11 NOTE — Progress Notes (Signed)
Patient is alert to self, calm and quiet on the unit, forwards very little. Admitted for observation due to paranoia. Patient has been compliant with medication, eating meals and then returning back to sleep. Patient has been reminded of needing Urine sample. Nursing staff will continue to monitor.

## 2022-06-11 NOTE — ED Notes (Signed)
Patient transferred to the [facility] via safe transport. All belongings from [facility] locker returned to patient.

## 2022-06-11 NOTE — ED Notes (Signed)
Pt sleeping at present, no distress noted.  Monitoring for safety. 

## 2022-06-11 NOTE — Discharge Instructions (Addendum)
Base on your presenting issue, outpatient psychiatry and therapeutic services addressing mental health and substance use have been recommended.  It is imperative to your mental health that seek out services 7-10 days from day of discharge to prevent another crisis. A list of referrals have been provided below based on your needs and recommendations.  You are not limited to the list provided.  In case of an urgent crisis, you may contact the Mobile Crisis Unit with [facility] at [phone].  Outpatient [facility] [place]  (private insurance only)       [address] [place], [place], [address]       [phone] phone       [place]      [address]      [place], [place], [address]      [phone] phone       [address] [place], [place] [address]      [phone]       [place]      [address] [place]      [place], [place] [address]      [phone]       [place]      [place] [address], [place], [place] [address]      [phone]       [facility] [place]      [address], [place] [address]      [place], [place] [address]      [phone]      Norma Fredrickson, MD specializes in geropsych)   Substance Use Counseling/Intensive Outpatient Therapy (CD-IOP, SAIOP)       [facility] - CD-IOP, Individual Therapy as well.      [address], [place], [address]      Contact [place] @ [phone] phone      [phone] fax       [facility] [place], [place], [address]      [phone] phone      [phone] fax       [place]      [address]      [place], [place], [address]      [phone] phone       [place], [place], [address]      [phone] phone  Residential Treatment Facilities  [place] [address] [place], [place], [address] [phone] phone [phone] fax  [facility] [address] [place], [place], [address] [phone] or [phone] phone [phone] fax  [address] [place], [place], [address] [phone] phone [phone] fax  [facility] [address] [place], [place], [address] [phone] phone [phone] fax  [place], [place], [address] [phone] phone    [facility] [place], [place], [place] phone ext. [phone] phone  [place]  [address], [place], [place] phone  [facility] [place] [address], [place] phone  [facility] [address] [place], [place] phone  [place], [place] phone ext. [phone]
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
## 2022-06-11 NOTE — BH Assessment (Signed)
LCSW Progress Note  Per Thomes Lolling, NP, this pt does require psychiatric hospitalization at this time and will be transferred to [facility].  Resources for outpatient therapy, psychiatry, substance use counseling/IOP, residential treatments, and women's shelters are still provided in the event of discharge from [facility].   Omelia [place], MSW, [place] [phone] or [phone]

## 2022-06-11 NOTE — Progress Notes (Signed)
[place] is currently reviewing per [place], RN. CSW will assist and follow.  Care Team notified: [facility] [place], RN, Jerry Caras, RN, Thomes Lolling, NP, Leonia Reader, RN.   Benjaman Kindler, MSW, [facility] 06/11/2022 12:24 PM

## 2022-06-11 NOTE — Progress Notes (Signed)
Patient ID: Taylor Wiggins, female   DOB: August 02, 1981, 41 y.o.   MRN: [id number]  Initial Treatment Plan 06/11/2022 6:47 PM ZIONA WICKENS [id number]    PATIENT STRESSORS: Financial difficulties   Medication change or noncompliance   Occupational concerns   Substance abuse     PATIENT STRENGTHS: Average or above average intelligence  Communication skills  General fund of knowledge  Motivation for treatment/growth    PATIENT IDENTIFIED PROBLEMS: Substance Abuse  Homelessness  Depression  Suicidal Ideation               DISCHARGE CRITERIA:  Ability to meet basic life and health needs Adequate post-discharge living arrangements Improved stabilization in mood, thinking, and/or behavior Reduction of life-threatening or endangering symptoms to within safe limits Verbal commitment to aftercare and medication compliance Withdrawal symptoms are absent or subacute and managed without 24-hour nursing intervention  PRELIMINARY DISCHARGE PLAN: Outpatient therapy Placement in alternative living arrangements Referrals indicated:  homeless,unemployment, SA  referrals  PATIENT/FAMILY INVOLVEMENT: This treatment plan has been presented to and reviewed with the patient, [name], and/or family member.  The patient and family have been given the opportunity to ask questions and make suggestions.  Harriet Masson, RN 06/11/2022, 6:47 PM

## 2022-06-11 NOTE — ED Notes (Signed)
Pt A&O x 4, suicidal ideations, no plan noted, denies HI or AVH.  Pt calm & cooperative, no distress noted.  Monitoring for safety.

## 2022-06-11 NOTE — Progress Notes (Signed)
Psychoeducational Group Note  Date:  06/11/2022 Time:  2153  Group Topic/Focus:  Wrap-Up Group:   The focus of this group is to help patients review their daily goal of treatment and discuss progress on daily workbooks.  Participation Level: Did Not Attend  Participation Quality:  Not Applicable  Affect:  Not Applicable  Cognitive:  Not Applicable  Insight:  Not Applicable  Engagement in Group: Not Applicable  Additional Comments:  The patient did not attend group this evening since she was asleep.   Archie Balboa S 06/11/2022, 9:53 PM

## 2022-06-11 NOTE — Progress Notes (Signed)
Pt was accepted to [facility] 06/11/22 after 1600; Bed Assignment   Pt meets inpatient criteria per Thomes Lolling, NP   Attending Physician will be Dr. Janine Limbo  Report can be called to: Adult unit: [phone]  Pt can arrive after 1600  Care Team notified: [facility] [place], RN, Jerry Caras, RN, Thomes Lolling, NP, Leonia Reader, RN.  Nadara Mode, [place] 06/11/2022 @ 12:50 PM

## 2022-06-12 ENCOUNTER — Encounter (HOSPITAL_COMMUNITY): Payer: Self-pay

## 2022-06-12 DIAGNOSIS — F332 Major depressive disorder, recurrent severe without psychotic features: Principal | ICD-10-CM

## 2022-06-12 DIAGNOSIS — F1111 Opioid abuse, in remission: Secondary | ICD-10-CM | POA: Diagnosis present

## 2022-06-12 DIAGNOSIS — F1994 Other psychoactive substance use, unspecified with psychoactive substance-induced mood disorder: Secondary | ICD-10-CM | POA: Diagnosis present

## 2022-06-12 DIAGNOSIS — F1510 Other stimulant abuse, uncomplicated: Secondary | ICD-10-CM | POA: Diagnosis present

## 2022-06-12 DIAGNOSIS — F1220 Cannabis dependence, uncomplicated: Secondary | ICD-10-CM | POA: Diagnosis present

## 2022-06-12 LAB — RAPID URINE DRUG SCREEN, HOSP PERFORMED
Amphetamines: POSITIVE — AB
Barbiturates: NOT DETECTED
Benzodiazepines: NOT DETECTED
Cocaine: NOT DETECTED
Opiates: NOT DETECTED
Tetrahydrocannabinol: POSITIVE — AB

## 2022-06-12 LAB — URINALYSIS, COMPLETE (UACMP) WITH MICROSCOPIC
Bilirubin Urine: NEGATIVE
Glucose, UA: NEGATIVE mg/dL
Hgb urine dipstick: NEGATIVE
Ketones, ur: NEGATIVE mg/dL
Leukocytes,Ua: NEGATIVE
Nitrite: NEGATIVE
Protein, ur: NEGATIVE mg/dL
Specific Gravity, Urine: 1.015 (ref 1.005–1.030)
pH: 8 (ref 5.0–8.0)

## 2022-06-12 MED ORDER — THIAMINE HCL 100 MG/ML IJ SOLN
100.0000 mg | Freq: Once | INTRAMUSCULAR | Status: AC
  Administered 2022-06-12: 100 mg via INTRAMUSCULAR
  Filled 2022-06-12: qty 2

## 2022-06-12 MED ORDER — CHLORDIAZEPOXIDE HCL 25 MG PO CAPS: 25.0000 mg | ORAL_CAPSULE | Freq: Four times a day (QID) | ORAL | Status: DC | PRN

## 2022-06-12 MED ORDER — NICOTINE POLACRILEX 2 MG MT GUM: 2.0000 mg | CHEWING_GUM | OROMUCOSAL | Status: DC | PRN

## 2022-06-12 MED ORDER — ADULT MULTIVITAMIN W/MINERALS CH
1.0000 | ORAL_TABLET | Freq: Every day | ORAL | Status: DC
  Administered 2022-06-12 – 2022-06-19 (×8): 1 via ORAL
  Filled 2022-06-12 (×10): qty 1

## 2022-06-12 MED ORDER — HYDROXYZINE HCL 25 MG PO TABS
25.0000 mg | ORAL_TABLET | Freq: Four times a day (QID) | ORAL | Status: AC | PRN
  Filled 2022-06-12 (×2): qty 1

## 2022-06-12 MED ORDER — THIAMINE HCL 100 MG PO TABS
100.0000 mg | ORAL_TABLET | Freq: Every day | ORAL | Status: DC
  Administered 2022-06-13: 100 mg via ORAL
  Filled 2022-06-12 (×3): qty 1

## 2022-06-12 MED ORDER — ONDANSETRON 4 MG PO TBDP
4.0000 mg | ORAL_TABLET | Freq: Four times a day (QID) | ORAL | Status: AC | PRN
Start: 2022-06-12 — End: 2022-06-15

## 2022-06-12 MED ORDER — LOPERAMIDE HCL 2 MG PO CAPS: 2.0000 mg | ORAL_CAPSULE | ORAL | Status: AC | PRN

## 2022-06-12 NOTE — BH IP Treatment Plan (Signed)
Interdisciplinary Treatment and Diagnostic Plan Update  06/12/2022 Time of Session: 9:15am  Taylor Wiggins MRN: [id number]  Principal Diagnosis: Substance induced mood disorder ([place])  Secondary Diagnoses: Principal Problem:   Substance induced mood disorder ([place]) Active Problems:   Tobacco abuse   Methamphetamine abuse ([place])   History of opioid abuse ([place])   Cannabis use disorder, moderate, dependence (HCC)   Current Medications:  Current Facility-Administered Medications  Medication Dose Route Frequency Provider Last Rate Last Admin   acetaminophen (TYLENOL) tablet 650 mg  650 mg Oral Q6H PRN Revonda Humphrey, NP       alum & mag hydroxide-simeth (MAALOX/MYLANTA) 200-200-20 MG/5ML suspension 30 mL  30 mL Oral Q4H PRN Revonda Humphrey, NP       chlordiazePOXIDE (LIBRIUM) capsule 25 mg  25 mg Oral Q6H PRN Freida Busman, MD       escitalopram (LEXAPRO) tablet 5 mg  5 mg Oral Daily Revonda Humphrey, NP   5 mg at 06/12/22 0757   feeding supplement (ENSURE ENLIVE / ENSURE PLUS) liquid 237 mL  237 mL Oral BID BM Massengill, Ovid Curd, MD       hydrOXYzine (ATARAX) tablet 25 mg  25 mg Oral TID PRN Revonda Humphrey, NP       hydrOXYzine (ATARAX) tablet 25 mg  25 mg Oral Q6H PRN Freida Busman, MD       loperamide (IMODIUM) capsule 2-4 mg  2-4 mg Oral PRN Freida Busman, MD       magnesium hydroxide (MILK OF MAGNESIA) suspension 30 mL  30 mL Oral Daily PRN Revonda Humphrey, NP       multivitamin with minerals tablet 1 tablet  1 tablet Oral Daily McQuilla, Jai B, MD       nicotine polacrilex (NICORETTE) gum 2 mg  2 mg Oral PRN Damita Dunnings B, MD       ondansetron (ZOFRAN-ODT) disintegrating tablet 4 mg  4 mg Oral Q6H PRN Damita Dunnings B, MD       pantoprazole (PROTONIX) EC tablet 40 mg  40 mg Oral Daily Thomes Lolling H, NP   40 mg at 06/12/22 0757   thiamine (B-1) injection 100 mg  100 mg Intramuscular Once Freida Busman, MD       [START ON 06/13/2022] thiamine tablet 100 mg   100 mg Oral Daily Damita Dunnings B, MD       traZODone (DESYREL) tablet 50 mg  50 mg Oral QHS PRN Revonda Humphrey, NP       PTA Medications: Medications Prior to Admission  Medication Sig Dispense Refill Last Dose   clonazePAM (KLONOPIN) 1 MG tablet Take 1 tablet by mouth 2 (two) times daily.      escitalopram (LEXAPRO) 5 MG tablet Take 1 tablet (5 mg total) by mouth daily.      omeprazole (PRILOSEC) 20 MG capsule Take 1 capsule (20 mg total) by mouth daily. (Patient taking differently: Take 20 mg by mouth daily as needed (For heartburn or acid reflux).) 30 capsule 0     Patient Stressors: Financial difficulties   Medication change or noncompliance   Occupational concerns   Substance abuse    Patient Strengths: Average or above average intelligence  Communication skills  General fund of knowledge  Motivation for treatment/growth   Treatment Modalities: Medication Management, Group therapy, Case management,  1 to 1 session with clinician, Psychoeducation, Recreational therapy.   Physician Treatment Plan for Primary Diagnosis: Substance induced mood disorder (  [place]) Long Term Goal(s):     Short Term Goals:    Medication Management: Evaluate patient's response, side effects, and tolerance of medication regimen.  Therapeutic Interventions: 1 to 1 sessions, Unit Group sessions and Medication administration.  Evaluation of Outcomes: Not Met  Physician Treatment Plan for Secondary Diagnosis: Principal Problem:   Substance induced mood disorder ([place]) Active Problems:   Tobacco abuse   Methamphetamine abuse (HCC)   History of opioid abuse ([place])   Cannabis use disorder, moderate, dependence ([place])  Long Term Goal(s):     Short Term Goals:       Medication Management: Evaluate patient's response, side effects, and tolerance of medication regimen.  Therapeutic Interventions: 1 to 1 sessions, Unit Group sessions and Medication administration.  Evaluation of Outcomes: Not  Met   RN Treatment Plan for Primary Diagnosis: Substance induced mood disorder ([place]) Long Term Goal(s): Knowledge of disease and therapeutic regimen to maintain health will improve  Short Term Goals: Ability to remain free from injury will improve, Ability to participate in decision making will improve, Ability to verbalize feelings will improve, Ability to disclose and discuss suicidal ideas, and Ability to identify and develop effective coping behaviors will improve  Medication Management: RN will administer medications as ordered by provider, will assess and evaluate patient's response and provide education to patient for prescribed medication. RN will report any adverse and/or side effects to prescribing provider.  Therapeutic Interventions: 1 on 1 counseling sessions, Psychoeducation, Medication administration, Evaluate responses to treatment, Monitor vital signs and CBGs as ordered, Perform/monitor CIWA, COWS, AIMS and Fall Risk screenings as ordered, Perform wound care treatments as ordered.  Evaluation of Outcomes: Not Met   LCSW Treatment Plan for Primary Diagnosis: Substance induced mood disorder ([place]) Long Term Goal(s): Safe transition to appropriate next level of care at discharge, Engage patient in therapeutic group addressing interpersonal concerns.  Short Term Goals: Engage patient in aftercare planning with referrals and resources, Increase social support, Increase emotional regulation, Facilitate acceptance of mental health diagnosis and concerns, Identify triggers associated with mental health/substance abuse issues, and Increase skills for wellness and recovery  Therapeutic Interventions: Assess for all discharge needs, 1 to 1 time with Social worker, Explore available resources and support systems, Assess for adequacy in community support network, Educate family and significant other(s) on suicide prevention, Complete Psychosocial Assessment, Interpersonal group  therapy.  Evaluation of Outcomes: Not Met   Progress in Treatment: Attending groups: Yes. Participating in groups: Yes. Taking medication as prescribed: Yes. Toleration medication: Yes. Family/Significant other contact made: Yes, individual(s) contacted:  If consents are provided  Patient understands diagnosis: Yes. Discussing patient identified problems/goals with staff: Yes. Medical problems stabilized or resolved: Yes. Denies suicidal/homicidal ideation: Yes. Issues/concerns per patient self-inventory: No.   New problem(s) identified: No, Describe:  None   New Short Term/Long Term Goal(s): medication stabilization, elimination of SI thoughts, development of comprehensive mental wellness plan.   Patient Goals: "Working on my anxiety and depression"   Discharge Plan or Barriers: Patient recently admitted. CSW will continue to follow and assess for appropriate referrals and possible discharge planning.   Reason for Continuation of Hospitalization: Anxiety Depression Medication stabilization Suicidal ideation Withdrawal symptoms  Estimated Length of Stay: 3 to 7 days   Last [place] Suicide Severity Risk Score: Flowsheet Row Admission (Current) from 06/11/2022 in [facility] ED from 06/10/2022 in [facility] ED from 06/09/2022 in [place]  High Risk High Risk No Risk       Last PHQ 2/9 Scores:     No data to display          Scribe for Treatment Team: Carney Harder 06/12/2022 2:27 PM

## 2022-06-12 NOTE — Progress Notes (Signed)
   06/12/22 0554  Psych Admission Type (Psych Patients Only)  Admission Status Voluntary  Psychosocial Assessment  Patient Complaints Anxiety  Eye Contact Fair  Facial Expression Flat  Affect Appropriate to circumstance  Speech Logical/coherent  Interaction Assertive  Motor Activity Other (Comment) (wnl)  Appearance/Hygiene Unremarkable  Behavior Characteristics Cooperative  Mood Anxious  Thought Process  Coherency WDL  Content WDL  Delusions None reported or observed  Perception WDL  Hallucination None reported or observed  Judgment UTA  Confusion None  Danger to Self  Current suicidal ideation? Denies  Danger to Others  Danger to Others None reported or observed   Progress note   D: Pt seen at nurse's station. Pt denies SI, HI, AVH. Pt endorses cramping in abdomen for past 2 days (6/10). Pt rates anxiety  8/10 and depression 9 /10. Pt slept the entire shift. "I was tired." States she has not had a good night's sleep in a while. Denies any other issues at present.  A: Pt provided support and encouragement. Q15 min checks for safety.   R: Pt safe on the unit. Will continue to monitor.

## 2022-06-12 NOTE — Progress Notes (Addendum)
Pt has been asleep for the entire shift so far. Will speak with patient in the morning if she wakes up for vital sign assessment. Pt safe on unit.
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
## 2022-06-12 NOTE — Progress Notes (Signed)
Pt denies SI/HI/AVH and verbally agrees to approach staff if these become apparent or before harming themselves/others. Rates depression 7/10. Rates anxiety 7/10. Rates pain 0/10. Pt has not been going to groups. Pt has been in her bed all day. Pt will get up for meds but then go right back to her room. Scheduled medications administered to pt, per MD orders. RN provided support and encouragement to pt. Q15 min safety checks implemented and continued. Pt safe on the unit. RN will continue to monitor and intervene as needed.   06/12/22 0757  Psych Admission Type (Psych Patients Only)  Admission Status Voluntary  Psychosocial Assessment  Patient Complaints Anxiety;Depression  Eye Contact Fair  Facial Expression Flat;Sad;Anxious  Affect Anxious;Depressed  Speech Logical/coherent;Soft  Interaction Assertive  Motor Activity Other (Comment) (WDL)  Appearance/Hygiene Unremarkable  Behavior Characteristics Cooperative;Appropriate to situation;Anxious  Mood Anxious;Depressed;Sad  Thought Process  Coherency WDL  Content Blaming self;Blaming others  Delusions None reported or observed  Perception WDL  Hallucination None reported or observed  Judgment Impaired  Confusion None  Danger to Self  Current suicidal ideation? Denies  Danger to Others  Danger to Others None reported or observed
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
## 2022-06-12 NOTE — Group Note (Signed)
Recreation Therapy Group Note   Group Topic:Team Building  Group Date: 06/12/2022 Start Time: 0935 End Time: 0955 Facilitators: Victorino Sparrow, LRT,CTRS Location: [address] Dayroom   Goal Area(s) Addresses:  Patient will effectively work with peer towards shared goal.  Patient will identify skills used to make activity successful.  Patient will identify how skills used during activity can be applied to reach post d/c goals.   Group Description: [employer]. In teams of 5-6, patients were given 25 small craft pipe cleaners. Using the materials provided, patients were instructed to compete again the opposing team(s) to build the tallest free-standing structure from floor level. The activity was timed; difficulty increased by Probation officer as Pharmacist, hospital continued.  Systematically resources were removed with additional directions for example, placing one arm behind their back, working in silence, and shape stipulations. LRT facilitated post-activity discussion reviewing team processes and necessary communication skills involved in completion. Patients were encouraged to reflect how the skills utilized, or not utilized, in this activity can be incorporated to positively impact support systems post discharge.   Affect/Mood: N/A   Participation Level: Did not attend    Clinical Observations/Individualized Feedback:     Plan: Continue to engage patient in RT group sessions 2-3x/week.   Victorino Sparrow, Glennis Brink 06/12/2022 12:29 PM

## 2022-06-12 NOTE — Progress Notes (Signed)
NUTRITION ASSESSMENT  Pt identified as at risk on the Malnutrition Screen Tool  INTERVENTION: 1. Supplements: Ensure Plus High Protein po BID, each supplement provides 350 kcal and 20 grams of protein.   NUTRITION DIAGNOSIS: Unintentional weight loss related to sub-optimal intake as evidenced by pt report.   Goal: Pt to meet >/= 90% of their estimated nutrition needs.  Monitor:  PO intake  Assessment:  Pt admitted with depression, anxiety, and substance abuse (meth). Pt currently homeless. Pt reports poor appetite and has lost 10 lbs. No recent weight loss noted in weight records. Ensure supplements have been ordered.   Height: Ht Readings from Last 1 Encounters:  06/11/22 '5\' 7"'$  (1.702 m)    Weight: Wt Readings from Last 1 Encounters:  06/11/22 59.9 kg    Weight Hx: Wt Readings from Last 10 Encounters:  06/11/22 59.9 kg  06/09/22 59 kg  05/03/22 56.7 kg  08/16/21 63.5 kg  11/30/20 65.8 kg  04/01/20 63.5 kg  02/19/20 68 kg  01/06/19 63.5 kg  02/06/18 68 kg  07/23/16 63.5 kg    BMI:  Body mass index is 20.67 kg/m. Pt meets criteria for normal based on current BMI.  Estimated Nutritional Needs: Kcal: 25-30 kcal/kg Protein: > 1 gram protein/kg Fluid: 1 ml/kcal  Diet Order:  Diet Order             Diet regular Room service appropriate? Yes; Fluid consistency: Thin  Diet effective now                  Pt is also offered choice of unit snacks mid-morning and mid-afternoon.  Pt is eating as desired.   Lab results and medications reviewed.   Clayton Bibles, MS, RD, LDN Inpatient Clinical Dietitian Contact information available via Amion
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
## 2022-06-12 NOTE — Group Note (Signed)
LCSW Group Therapy Note  Group Date: 06/12/2022 Start Time: 1300 End Time: 1400   Type of Therapy and Topic:  Group Therapy - Healthy vs Unhealthy Coping Skills  Participation Level:  Did Not Attend   Description of Group The focus of this group was to determine what unhealthy coping techniques typically are used by group members and what healthy coping techniques would be helpful in coping with various problems. Patients were guided in becoming aware of the differences between healthy and unhealthy coping techniques. Patients were asked to identify 2-3 healthy coping skills they would like to learn to use more effectively.  Therapeutic Goals Patients learned that coping is what human beings do all day long to deal with various situations in their lives Patients defined and discussed healthy vs unhealthy coping techniques Patients identified their preferred coping techniques and identified whether these were healthy or unhealthy Patients determined 2-3 healthy coping skills they would like to become more familiar with and use more often. Patients provided support and ideas to each other   Summary of Patient Progress:   Patient did not attend group despite encouraged participation.   Therapeutic Modalities Cognitive Behavioral Therapy Motivational Interviewing  Larose Kells 06/12/2022  2:11 PM

## 2022-06-12 NOTE — [facility] Group Notes (Signed)
Patient did not attend the NA group. 

## 2022-06-12 NOTE — Progress Notes (Signed)
   06/12/22 2015  Psych Admission Type (Psych Patients Only)  Admission Status Voluntary  Psychosocial Assessment  Patient Complaints Anxiety;Depression;Substance abuse  Eye Contact Fair  Facial Expression Sad;Anxious  Affect Anxious;Depressed  Speech Logical/coherent  Interaction Assertive  Motor Activity Other (Comment) (wnl)  Appearance/Hygiene Unremarkable  Behavior Characteristics Cooperative;Appropriate to situation  Mood Depressed;Anxious  Thought Process  Coherency WDL  Content WDL  Delusions None reported or observed  Perception WDL  Hallucination None reported or observed  Judgment Impaired  Confusion None  Danger to Self  Current suicidal ideation? Denies  Agreement Not to Harm Self Yes  Description of Agreement verbal  Danger to Others  Danger to Others None reported or observed   Progress note   D: Pt seen in her room. Pt denies SI, HI, AVH. Pt contracts for safety. Pt rates pain  5/10 as a frontal headach. Pt rates anxiety  6/10 and depression  6/10. Pt endorses headache, sweats, anxiety and dizziness as withdrawal symptoms. CIWA=5. Pt states that she is tired today. Pt reports good appetite. "I'm tired today. I'll be better tomorrow." Pt minimal but will come out of her room with encouragement. No other complaints noted.  A: Pt provided support and encouragement. Pt given scheduled medication as prescribed. PRNs as appropriate. Q15 min checks for safety.   R: Pt safe on the unit. Will continue to monitor.

## 2022-06-13 DIAGNOSIS — F1994 Other psychoactive substance use, unspecified with psychoactive substance-induced mood disorder: Secondary | ICD-10-CM

## 2022-06-13 MED ORDER — LAMOTRIGINE 25 MG PO TABS
25.0000 mg | ORAL_TABLET | Freq: Every day | ORAL | Status: DC
  Administered 2022-06-13 – 2022-06-19 (×7): 25 mg via ORAL
  Filled 2022-06-13 (×9): qty 1

## 2022-06-13 NOTE — Progress Notes (Signed)
[facility] MD Progress Note  06/13/2022 12:37 PM Taylor Wiggins  MRN:  [id number] Subjective:  Taylor Wiggins is a 41 yo patient w/ PPH of polysubstance abuse (including hx of opioid use disorder, cocaine use disorder, and THC use disorder) , substance induced mood disorder, substance induced psychosis,  GAD and ADHD. Patient presented to [facility] via [facility] after endorsing SI when her husband dropped her at a [employer] and told her he did not want to see her again. Patient appeared paranoid at [facility] but did not require antipsychotic. Patient did start Lexapro.   Case was discussed in the multidisciplinary team. MAR was reviewed and patient was compliant with medications.  She did not require any PRN's for agitation.     Psychiatric Team made the following recommendations yesterday:  -- Continue Lexapro '5mg'$ , for depressed mood - CIWA w/ Librium PRN  On assessment today patient reports that she is feeling "better." Patient reports that she is having some withdrawal symptoms, mostly chills. Patient reports that she is not having any cravings for any substances. Patient reports that she was able to sleep well and her appetite is ok. Patient reports that her thoughts still feel cloudy, and that she is anxious. Patient reports that her anxiety is a 8/10 ( 10 is severe) and reports that she believes getting into contact with her mother would help lower the anxiety. Patient reports that her depression is a 9/10. Patient reports that she does not really like the Lexapro as it makes her sleepy and reports that she has tried this in the past and it did not really help her. Patient reports she is also not willing to go up on the dose due to her sedation at current dose. Patient denies active SI,but endorses passive SI and is able to contract for safety. Patient denies HI, AVH and paranoia. Patietn was able to report that she has had lamictal in the past, and this was the most clear she felt. Patient reports she would like to be  restarted on lacmictal.   Patient still reluctant to allo provider to call her mom before patient is able to reach them. Patient reports tht she will reattempts and reach out to [facility] today.  Principal Problem: Substance induced mood disorder (HCC) Diagnosis: Principal Problem:   Substance induced mood disorder (HCC) Active Problems:   Tobacco abuse   Methamphetamine abuse ([place])   History of opioid abuse ([place])   Cannabis use disorder, moderate, dependence ([place])  Total Time spent with patient: 20 minutes  Past Psychiatric History: Se H&P  Past Medical History:  Past Medical History:  Diagnosis Date   Adult ADHD    Anxiety    Asthma    IBS (irritable bowel syndrome)    Opioid use disorder    No longer on buprenorphine therapy   PTSD (post-traumatic stress disorder)     Past Surgical History:  Procedure Laterality Date   BREAST REDUCTION SURGERY  2005   Family History: History reviewed. No pertinent family history. Family Psychiatric  History: See H&P Social History:  Social History   Substance and Sexual Activity  Alcohol Use No     Social History   Substance and Sexual Activity  Drug Use Yes   Types: Benzodiazepines, Marijuana, Methamphetamines   Comment: recently    Social History   Socioeconomic History   Marital status: Single    Spouse name: Not on file   Number of children: Not on file   Years of education: Not on file  Highest education level: Not on file  Occupational History   Not on file  Tobacco Use   Smoking status: Every Day    Packs/day: 1.00    Types: Cigarettes    Last attempt to quit: 09/06/2014    Years since quitting: 7.7   Smokeless tobacco: Never  Substance and Sexual Activity   Alcohol use: No   Drug use: Yes    Types: Benzodiazepines, Marijuana, Methamphetamines    Comment: recently   Sexual activity: Never    Birth control/protection: Abstinence  Other Topics Concern   Not on file  Social History Narrative   41 y/o Caucasian  female. Married and homeless with polysubstance abuse. Per pt-Graduated from [employer] in 2006 with bachelors degree in [place].   Social Determinants of Health   Financial Resource Strain: Not on file  Food Insecurity: Not on file  Transportation Needs: Not on file  Physical Activity: Not on file  Stress: Not on file  Social Connections: Not on file   Additional Social History:                         Sleep: Fair  Appetite:  Good  Current Medications: Current Facility-Administered Medications  Medication Dose Route Frequency Provider Last Rate Last Admin   acetaminophen (TYLENOL) tablet 650 mg  650 mg Oral Q6H PRN Revonda Humphrey, NP   650 mg at 06/12/22 2110   alum & mag hydroxide-simeth (MAALOX/MYLANTA) 200-200-20 MG/5ML suspension 30 mL  30 mL Oral Q4H PRN Revonda Humphrey, NP       chlordiazePOXIDE (LIBRIUM) capsule 25 mg  25 mg Oral Q6H PRN Freida Busman, MD       feeding supplement (ENSURE ENLIVE / ENSURE PLUS) liquid 237 mL  237 mL Oral BID BM Massengill, Nathan, MD       hydrOXYzine (ATARAX) tablet 25 mg  25 mg Oral TID PRN Revonda Humphrey, NP   25 mg at 06/12/22 2110   hydrOXYzine (ATARAX) tablet 25 mg  25 mg Oral Q6H PRN Freida Busman, MD       lamoTRIgine (LAMICTAL) tablet 25 mg  25 mg Oral Daily Damita Dunnings B, MD       loperamide (IMODIUM) capsule 2-4 mg  2-4 mg Oral PRN Damita Dunnings B, MD       magnesium hydroxide (MILK OF MAGNESIA) suspension 30 mL  30 mL Oral Daily PRN Revonda Humphrey, NP       multivitamin with minerals tablet 1 tablet  1 tablet Oral Daily Damita Dunnings B, MD   1 tablet at 06/13/22 0759   nicotine polacrilex (NICORETTE) gum 2 mg  2 mg Oral PRN Damita Dunnings B, MD       ondansetron (ZOFRAN-ODT) disintegrating tablet 4 mg  4 mg Oral Q6H PRN Freida Busman, MD       pantoprazole (PROTONIX) EC tablet 40 mg  40 mg Oral Daily Thomes Lolling H, NP   40 mg at 06/13/22 0800   thiamine tablet 100 mg  100 mg  Oral Daily Damita Dunnings B, MD   100 mg at 06/13/22 0800   traZODone (DESYREL) tablet 50 mg  50 mg Oral QHS PRN Revonda Humphrey, NP        Lab Results:  Results for orders placed or performed during the hospital encounter of 06/11/22 (from the past 48 hour(s))  Rapid urine drug screen (hospital performed)     Status: Abnormal  Collection Time: 06/12/22 11:45 AM  Result Value Ref Range   Opiates NONE DETECTED NONE DETECTED   Cocaine NONE DETECTED NONE DETECTED   Benzodiazepines NONE DETECTED NONE DETECTED   Amphetamines POSITIVE (A) NONE DETECTED   Tetrahydrocannabinol POSITIVE (A) NONE DETECTED   Barbiturates NONE DETECTED NONE DETECTED    Comment: (NOTE) DRUG SCREEN FOR MEDICAL PURPOSES ONLY.  IF CONFIRMATION IS NEEDED FOR ANY PURPOSE, NOTIFY LAB WITHIN 5 DAYS.  LOWEST DETECTABLE LIMITS FOR URINE DRUG SCREEN Drug Class                     Cutoff (ng/mL) Amphetamine and metabolites    1000 Barbiturate and metabolites    200 Benzodiazepine                 270 Tricyclics and metabolites     300 Opiates and metabolites        300 Cocaine and metabolites        300 THC                            50 Performed at [facility] [place], [place] [address] [place], [place] [address]   Urinalysis, Complete w Microscopic Urine, Clean Catch     Status: Abnormal   Collection Time: 06/12/22 11:45 AM  Result Value Ref Range   Color, Urine YELLOW YELLOW   APPearance CLOUDY (A) CLEAR   Specific Gravity, Urine 1.015 1.005 - 1.030   pH 8.0 5.0 - 8.0   Glucose, UA NEGATIVE NEGATIVE mg/dL   Hgb urine dipstick NEGATIVE NEGATIVE   Bilirubin Urine NEGATIVE NEGATIVE   Ketones, ur NEGATIVE NEGATIVE mg/dL   Protein, ur NEGATIVE NEGATIVE mg/dL   Nitrite NEGATIVE NEGATIVE   Leukocytes,Ua NEGATIVE NEGATIVE   RBC / HPF 0-5 0 - 5 RBC/hpf   WBC, UA 0-5 0 - 5 WBC/hpf   Bacteria, UA FEW (A) NONE SEEN   Squamous Epithelial / LPF 11-20 0 - 5   Mucus PRESENT    Amorphous Crystal PRESENT      Comment: Performed at [facility], [place] [address], [place], [place] [address]    Blood Alcohol level:  Lab Results  Component Value Date   ETH <10 06/09/2022   ETH <10 17/61/6073    Metabolic Disorder Labs: Lab Results  Component Value Date   HGBA1C 5.4 06/11/2022   MPG 108.28 06/11/2022   No results found for: "PROLACTIN" Lab Results  Component Value Date   CHOL 168 06/11/2022   TRIG 107 06/11/2022   HDL 60 06/11/2022   CHOLHDL 2.8 06/11/2022   VLDL 21 06/11/2022   LDLCALC 87 06/11/2022    Physical Findings: AIMS: Facial and Oral Movements Muscles of Facial Expression: None, normal Lips and Perioral Area: None, normal Jaw: None, normal Tongue: None, normal,Extremity Movements Upper (arms, wrists, hands, fingers): None, normal Lower (legs, knees, ankles, toes): None, normal, Trunk Movements Neck, shoulders, hips: None, normal, Overall Severity Severity of abnormal movements (highest score from questions above): None, normal Incapacitation due to abnormal movements: None, normal Patient's awareness of abnormal movements (rate only patient's report): No Awareness, Dental Status Current problems with teeth and/or dentures?: No Does patient usually wear dentures?: No  CIWA:  CIWA-Ar Total: 6 COWS:     Musculoskeletal: Strength & Muscle Tone: within normal limits Gait & Station: normal Patient leans: N/A  Psychiatric Specialty Exam:  Presentation  General Appearance: Appropriate for Environment; Casual  Eye Contact:Fair  Speech:Clear and Coherent  Speech Volume:Normal  Handedness:Right   Mood and Affect  Mood:Depressed  Affect:Congruent   Thought Process  Thought Processes:Goal Directed (some thought blocking)  Descriptions of Associations:Circumstantial  Orientation:Full (Time, Place and Person)  Thought Content:Logical  History of Schizophrenia/Schizoaffective disorder:No  Duration of Psychotic  Symptoms:N/A  Hallucinations:Hallucinations: None  Ideas of Reference:None  Suicidal Thoughts:Suicidal Thoughts: Yes, Passive SI Passive Intent and/or Plan: Without Intent  Homicidal Thoughts:Homicidal Thoughts: No   Sensorium  Memory:Immediate Fair; Recent [place]  Insight:Shallow   Executive Functions  Concentration:Poor  Attention Span:Fair  [place]   Psychomotor Activity  Psychomotor Activity:Psychomotor Activity: Decreased   Assets  Assets:Resilience   Sleep  Sleep:Sleep: Fair    Physical Exam: Physical Exam HENT:     Head: Normocephalic and atraumatic.  Pulmonary:     Effort: Pulmonary effort is normal.  Neurological:     Mental Status: She is alert and oriented to person, place, and time.    Review of Systems  Psychiatric/Behavioral:  Positive for depression and suicidal ideas. Negative for hallucinations. The patient does not have insomnia.    Blood pressure 94/80, pulse (!) 103, temperature 98.4 F (36.9 C), temperature source Oral, resp. rate 16, height '5\' 7"'$  (1.702 m), weight 59.9 kg, last menstrual period 05/28/2022, SpO2 98 %. Body mass index is 20.67 kg/m.   Treatment Plan Summary: Daily contact with patient to assess and evaluate symptoms and progress in treatment and Medication management    Patient continues to appear depressed and isolative. Patient is not willing to allow for increase dose of Lexapro and also reports hx of failure. Patient is willing to try a different medication. Patient also does appear to be going through withdrawal but is fairly stable. Patient was educated on importance of compliance and potential side effects regarding Lamictal.   Labs Reviewed: TSH- WNL, Lipid- WNL, A1c- WNL, CMP: ALT 49, CBC- WNL, UA: rare bacteria and UDS-  +TCH and meth   EKG: Qtc- pending   Substance induced mood disorder( R/O substance induced psychosis) Stimulant use  disorder, severe methamphetamines Hx of opioid use disorder   -- Discontinue Lexapro '5mg'$ , for depressed mood due to failure -- Start Lamictal '25mg'$ , for impuslivity and mood instability     Etoh use disorder, severe - CIWA w/ Librium PRN  -- Last CIWA: 6  Tobacco use disorder, moderate - Nicorette gum     PGY-2 Freida Busman, MD 06/13/2022, 12:37 PM

## 2022-06-13 NOTE — B[facility] Group Notes (Signed)
[place] Group Notes:  (Nursing/MHT/Case Management/Adjunct)  Date:  06/13/2022  Time:  11:26 AM  Type of Therapy:  Group Therapy: Goals Group  Participation Level:  Did Not Attend  Summary of Progress/Problems:  Patient did not attend goals group today.   Elza Rafter 06/13/2022, 11:26 AM

## 2022-06-13 NOTE — BHH Group Notes (Signed)
[place] Group Notes:  (Nursing/MHT/Case Management/Adjunct)  Date:  06/13/2022  Time:  11:56 AM  Type of Therapy:  Group Therapy:   Participation Level:  Did Not Attend   Summary of Progress/Problems:   Patient did not attend reflection (check-in) group today.   Elza Rafter 06/13/2022, 11:56 AM

## 2022-06-14 MED ORDER — CHLORDIAZEPOXIDE HCL 25 MG PO CAPS
25.0000 mg | ORAL_CAPSULE | Freq: Four times a day (QID) | ORAL | Status: AC
  Administered 2022-06-14 (×3): 25 mg via ORAL
  Filled 2022-06-14 (×3): qty 1

## 2022-06-14 MED ORDER — THIAMINE HCL 100 MG PO TABS
100.0000 mg | ORAL_TABLET | Freq: Every day | ORAL | Status: DC
  Administered 2022-06-15 – 2022-06-19 (×5): 100 mg via ORAL
  Filled 2022-06-14 (×6): qty 1

## 2022-06-14 MED ORDER — CHLORDIAZEPOXIDE HCL 25 MG PO CAPS
25.0000 mg | ORAL_CAPSULE | Freq: Every day | ORAL | Status: DC
  Filled 2022-06-14: qty 1

## 2022-06-14 MED ORDER — CHLORDIAZEPOXIDE HCL 25 MG PO CAPS
25.0000 mg | ORAL_CAPSULE | ORAL | Status: AC
  Administered 2022-06-16 (×2): 25 mg via ORAL
  Filled 2022-06-14 (×2): qty 1

## 2022-06-14 MED ORDER — CHLORDIAZEPOXIDE HCL 25 MG PO CAPS
25.0000 mg | ORAL_CAPSULE | Freq: Three times a day (TID) | ORAL | Status: AC
  Administered 2022-06-15 (×3): 25 mg via ORAL
  Filled 2022-06-14 (×2): qty 1

## 2022-06-14 NOTE — Progress Notes (Signed)
Pt did not attend AA meeting this evening.

## 2022-06-14 NOTE — BHH Group Notes (Signed)
[place] Group Notes:  (Nursing/MHT/Case Management/Adjunct)  Date:  06/14/2022  Time:  1:35 PM  Type of Therapy:  Group Therapy  Participation Level:  Did Not Attend  Summary of Progress/Problems:  Patient did not attend orientation and goals group today.   Elza Rafter 06/14/2022, 1:35 PM

## 2022-06-14 NOTE — Progress Notes (Signed)
   06/13/22 2020  Psych Admission Type (Psych Patients Only)  Admission Status Voluntary  Psychosocial Assessment  Patient Complaints Substance abuse;Anxiety;Depression  Eye Contact Fair  Facial Expression Flat  Affect Depressed;Anxious  Speech Logical/coherent  Interaction Minimal  Motor Activity Other (Comment) (wnl)  Appearance/Hygiene Unremarkable  Behavior Characteristics Cooperative;Anxious  Mood Depressed;Anxious  Thought Process  Coherency WDL  Content WDL  Delusions None reported or observed  Perception WDL  Hallucination None reported or observed  Judgment Impaired  Confusion None  Danger to Self  Current suicidal ideation? Denies  Agreement Not to Harm Self Yes  Description of Agreement verbal  Danger to Others  Danger to Others None reported or observed   Progress note   D: Pt seen in bedroom. Pt denies SI, HI, AVH. Pt contracts for safety. Pt rates pain  0/10. Pt rates anxiety  6/10 and depression  7/10. Pt endorses fatigue, sweats, nausea and anxiety. "I'll be better tomorrow." Pt CIWA=5. VSS. Appetite fair. No other complaints at this time.  A: Pt provided support and encouragement. Pt given scheduled medication as prescribed. PRNs as appropriate. Q15 min checks for safety.   R: Pt safe on the unit. Will continue to monitor.

## 2022-06-14 NOTE — Progress Notes (Signed)
[facility] MD Progress Note  06/14/2022 4:57 PM Taylor [facility]  MRN:  [id number] Subjective:     Taylor Wiggins is a 41 yo patient w/ PPH of polysubstance abuse (including hx of opioid use disorder, cocaine use disorder, and THC use disorder) , substance induced mood disorder, substance induced psychosis,  GAD and ADHD. Patient presented to [facility] via [facility] after endorsing SI when her husband dropped her at a [employer] and told her he did not want to see her again. Patient appeared paranoid at [facility] but did not require antipsychotic. Patient did start Lexapro.    Case was discussed in the multidisciplinary team. MAR was reviewed and patient was compliant with medications.  She did not require any PRN's for agitation.     Psychiatric Team made the following recommendations yesterday:  -- Discontinue Lexapro '5mg'$ , for depressed mood due to failure -- Start Lamictal '25mg'$ , for impuslivity and mood instability - CIWA w/ Librium PRN  Per staff patient is having numerous withdrawal symptoms and is not doing much beyond laying in bed.   On assessment this AM patient reports she feels poorly and rates her depression a 8/10 and her anxiety a 7/10. Patient reports she is not sure why she feels so anxious and that she was finally able to have a good conversation with her mother yesterday. Patient reports that her mother will allow her to return home when ready. Patient reports she would prefer if provider did not call mom to talk about patient's presentation on arrival and the things she reports over the last 3 weeks. Patient reports she is nauseous with poor appetite, and continues to have chills. Patient and provider discuss putting patient on scheduled Librium. Per RN patient denied it earlier in the AM, but on assessment patient reports she thought she was told she did not need it. Patient reports she would like to start a scheduled taper and endorses that she thinks her physical symptoms could be contributed to her  depression.  Patient denies SI, HI, and AVH as well as paranoia or delusions.  Principal Problem: Substance induced mood disorder (HCC) Diagnosis: Principal Problem:   Substance induced mood disorder (HCC) Active Problems:   Tobacco abuse   Methamphetamine abuse ([place])   History of opioid abuse ([place])   Cannabis use disorder, moderate, dependence ([place])  Total Time spent with patient: 20 minutes  Past Psychiatric History: See H&P  Past Medical History:  Past Medical History:  Diagnosis Date   Adult ADHD    Anxiety    Asthma    IBS (irritable bowel syndrome)    Opioid use disorder    No longer on buprenorphine therapy   PTSD (post-traumatic stress disorder)     Past Surgical History:  Procedure Laterality Date   BREAST REDUCTION SURGERY  2005   Family History: History reviewed. No pertinent family history. Family Psychiatric  History: See H&P Social History:  Social History   Substance and Sexual Activity  Alcohol Use No     Social History   Substance and Sexual Activity  Drug Use Yes   Types: Benzodiazepines, Marijuana, Methamphetamines   Comment: recently    Social History   Socioeconomic History   Marital status: Single    Spouse name: Not on file   Number of children: Not on file   Years of education: Not on file   Highest education level: Not on file  Occupational History   Not on file  Tobacco Use   Smoking status: Every Day  Packs/day: 1.00    Types: Cigarettes    Last attempt to quit: 09/06/2014    Years since quitting: 7.7   Smokeless tobacco: Never  Substance and Sexual Activity   Alcohol use: No   Drug use: Yes    Types: Benzodiazepines, Marijuana, Methamphetamines    Comment: recently   Sexual activity: Never    Birth control/protection: Abstinence  Other Topics Concern   Not on file  Social History Narrative   41 y/o Caucasian female. Married and homeless with polysubstance abuse. Per pt-Graduated from [employer] in 2006 with  bachelors degree in [place].   Social Determinants of Health   Financial Resource Strain: Not on file  Food Insecurity: Not on file  Transportation Needs: Not on file  Physical Activity: Not on file  Stress: Not on file  Social Connections: Not on file   Additional Social History:                         Sleep: Fair  Appetite:  Poor  Current Medications: Current Facility-Administered Medications  Medication Dose Route Frequency Provider Last Rate Last Admin   acetaminophen (TYLENOL) tablet 650 mg  650 mg Oral Q6H PRN Revonda Humphrey, NP   650 mg at 06/12/22 2110   alum & mag hydroxide-simeth (MAALOX/MYLANTA) 200-200-20 MG/5ML suspension 30 mL  30 mL Oral Q4H PRN Revonda Humphrey, NP       chlordiazePOXIDE (LIBRIUM) capsule 25 mg  25 mg Oral QID Damita Dunnings B, MD   25 mg at 06/14/22 1213   Followed by   Derrill Memo ON 06/15/2022] chlordiazePOXIDE (LIBRIUM) capsule 25 mg  25 mg Oral TID Damita Dunnings B, MD       Followed by   Derrill Memo ON 06/16/2022] chlordiazePOXIDE (LIBRIUM) capsule 25 mg  25 mg Oral BH-qamhs Damita Dunnings B, MD       Followed by   Derrill Memo ON 06/17/2022] chlordiazePOXIDE (LIBRIUM) capsule 25 mg  25 mg Oral Daily Markiyah Gahm, Joyce Gross B, MD       feeding supplement (ENSURE ENLIVE / ENSURE PLUS) liquid 237 mL  237 mL Oral BID BM Massengill, Ovid Curd, MD   237 mL at 06/14/22 8101   hydrOXYzine (ATARAX) tablet 25 mg  25 mg Oral TID PRN Revonda Humphrey, NP   25 mg at 06/13/22 2127   hydrOXYzine (ATARAX) tablet 25 mg  25 mg Oral Q6H PRN Damita Dunnings B, MD       lamoTRIgine (LAMICTAL) tablet 25 mg  25 mg Oral Daily Damita Dunnings B, MD   25 mg at 06/14/22 7510   loperamide (IMODIUM) capsule 2-4 mg  2-4 mg Oral PRN Damita Dunnings B, MD       magnesium hydroxide (MILK OF MAGNESIA) suspension 30 mL  30 mL Oral Daily PRN Revonda Humphrey, NP       multivitamin with minerals tablet 1 tablet  1 tablet Oral Daily Damita Dunnings B, MD   1 tablet at 06/14/22 2585    nicotine polacrilex (NICORETTE) gum 2 mg  2 mg Oral PRN Damita Dunnings B, MD       ondansetron (ZOFRAN-ODT) disintegrating tablet 4 mg  4 mg Oral Q6H PRN Freida Busman, MD       pantoprazole (PROTONIX) EC tablet 40 mg  40 mg Oral Daily Revonda Humphrey, NP   40 mg at 06/14/22 0812   [START ON 06/15/2022] thiamine tablet 100 mg  100 mg Oral Daily Damita Dunnings  B, MD       traZODone (DESYREL) tablet 50 mg  50 mg Oral QHS PRN Revonda Humphrey, NP        Lab Results: No results found for this or any previous visit (from the past 51 hour(s)).  Blood Alcohol level:  Lab Results  Component Value Date   ETH <10 06/09/2022   ETH <10 33/82/5053    Metabolic Disorder Labs: Lab Results  Component Value Date   HGBA1C 5.4 06/11/2022   MPG 108.28 06/11/2022   No results found for: "PROLACTIN" Lab Results  Component Value Date   CHOL 168 06/11/2022   TRIG 107 06/11/2022   HDL 60 06/11/2022   CHOLHDL 2.8 06/11/2022   VLDL 21 06/11/2022   LDLCALC 87 06/11/2022    Physical Findings: AIMS: Facial and Oral Movements Muscles of Facial Expression: None, normal Lips and Perioral Area: None, normal Jaw: None, normal Tongue: None, normal,Extremity Movements Upper (arms, wrists, hands, fingers): None, normal Lower (legs, knees, ankles, toes): None, normal, Trunk Movements Neck, shoulders, hips: None, normal, Overall Severity Severity of abnormal movements (highest score from questions above): None, normal Incapacitation due to abnormal movements: None, normal Patient's awareness of abnormal movements (rate only patient's report): No Awareness, Dental Status Current problems with teeth and/or dentures?: No Does patient usually wear dentures?: No  CIWA:  CIWA-Ar Total: 7 COWS:     Musculoskeletal: Strength & Muscle Tone: within normal limits Gait & Station: normal Patient leans: N/A  Psychiatric Specialty Exam:  Presentation  General Appearance: Casual  Eye  Contact:Fair  Speech:Clear and Coherent  Speech Volume:Normal  Handedness:Right   Mood and Affect  Mood:Depressed  Affect:Congruent; Depressed   Thought Process  Thought Processes:Goal Directed  Descriptions of Associations:Circumstantial  Orientation:Full (Time, Place and Person)  Thought Content:Logical  History of Schizophrenia/Schizoaffective disorder:No  Duration of Psychotic Symptoms:Less than six months  Hallucinations:Hallucinations: None  Ideas of Reference:None  Suicidal Thoughts:Suicidal Thoughts: No SI Passive Intent and/or Plan: Without Intent  Homicidal Thoughts:Homicidal Thoughts: No   Sensorium  Memory:Immediate Fair; Recent Fair  Judgment:Poor  Insight:Shallow   Executive Functions  Concentration:Fair  Attention Span:Poor  [place]   Psychomotor Activity  Psychomotor Activity:Psychomotor Activity: Decreased   Assets  Assets:Desire for Improvement; Resilience; Social Support   Sleep  Sleep:Sleep: Fair    Physical Exam: Physical Exam HENT:     Head: Normocephalic and atraumatic.  Pulmonary:     Effort: Pulmonary effort is normal.  Neurological:     Mental Status: She is alert and oriented to person, place, and time.    Review of Systems  Constitutional:  Positive for chills and malaise/fatigue.  Gastrointestinal:  Positive for nausea.  Neurological:  Negative for tingling.  Psychiatric/Behavioral:  Positive for depression. Negative for hallucinations and suicidal ideas. The patient does not have insomnia.    Blood pressure 117/74, pulse 97, temperature 98.4 F (36.9 C), temperature source Oral, resp. rate 18, height '5\' 7"'$  (1.702 m), weight 59.9 kg, last menstrual period 05/28/2022, SpO2 98 %. Body mass index is 20.67 kg/m.   Treatment Plan Summary: Daily contact with patient to assess and evaluate symptoms and progress in treatment and Medication management  Based on  symptoms and objectively looking at patient, CIWA appears to be underscored, patient will need to start a scheduled taper as she is going through withdrawal and cannot really participate in therapy and her mood is also be negatively impacted. Patient did appear to be very focused on  rehab after finishing detox.   Labs Reviewed: TSH- WNL, Lipid- WNL, A1c- WNL, CMP: ALT 49, CBC- WNL, UA: rare bacteria and UDS-  +TCH and meth   EKG: Qtc- 436 NSR, HR 76   Substance induced mood disorder( R/O substance induced psychosis) Stimulant use disorder, severe methamphetamines Hx of opioid use disorder -- Continue Lamictal '25mg'$ , for impuslivity and mood instability     Etoh use disorder, severe - START CIWA w/ Librium taper scheduled  -- Last CIWA: 7   Tobacco use disorder, moderate - Nicorette gum     PGY-2 Freida Busman, MD 06/14/2022, 4:57 PM

## 2022-06-14 NOTE — Group Note (Unsigned)
LCSW Group Therapy Note  Group Date: 06/14/2022 Start Time: 1300 End Time: 1400   Type of Therapy and Topic:  Group Therapy - Healthy vs Unhealthy Coping Skills  Participation Level:  {[facility] PARTICIPATION EFEOF:12197}   Description of Group The focus of this group was to determine what unhealthy coping techniques typically are used by group members and what healthy coping techniques would be helpful in coping with various problems. Patients were guided in becoming aware of the differences between healthy and unhealthy coping techniques. Patients were asked to identify 2-3 healthy coping skills they would like to learn to use more effectively.  Therapeutic Goals 1. Patients learned that coping is what human beings do all day long to deal with various situations in their lives 2. Patients defined and discussed healthy vs unhealthy coping techniques 3. Patients identified their preferred coping techniques and identified whether these were healthy or unhealthy 4. Patients determined 2-3 healthy coping skills they would like to become more familiar with and use more often. 5. Patients provided support and ideas to each other   Summary of Patient Progress:  During group, *** expressed ***. Patient proved open to input from peers and feedback from [place]. Patient demonstrated *** insight into the subject matter, was respectful of peers, and participated throughout the entire session.   Therapeutic Modalities Cognitive Behavioral Therapy Motivational Interviewing  Larose Kells 06/14/2022  1:07 PM

## 2022-06-14 NOTE — Progress Notes (Signed)
   06/14/22 2316  Psych Admission Type (Psych Patients Only)  Admission Status Voluntary  Psychosocial Assessment  Patient Complaints Anxiety  Eye Contact Brief  Facial Expression Flat  Affect Appropriate to circumstance  Speech Logical/coherent  Interaction Minimal  Motor Activity Other (Comment) (WDL)  Appearance/Hygiene Unremarkable  Behavior Characteristics Appropriate to situation  Mood Pleasant  Thought Process  Coherency WDL  Content WDL  Delusions None reported or observed  Perception WDL  Hallucination None reported or observed  Judgment Impaired  Confusion None  Danger to Self  Current suicidal ideation? Denies  Self-Injurious Behavior No self-injurious ideation or behavior indicators observed or expressed   Agreement Not to Harm Self Yes  Description of Agreement verbal  Danger to Others  Danger to Others None reported or observed

## 2022-06-14 NOTE — Progress Notes (Signed)
   06/14/22 0800  Psych Admission Type (Psych Patients Only)  Admission Status Voluntary  Psychosocial Assessment  Patient Complaints None  Eye Contact Brief  Facial Expression Flat  Affect Anxious;Depressed  Speech Logical/coherent;Soft  Interaction Cautious;Minimal  Motor Activity Other (Comment) (Unremarkable.)  Appearance/Hygiene Unremarkable  Behavior Characteristics Cooperative;Anxious  Mood Depressed;Anxious  Thought Process  Coherency WDL  Content WDL  Delusions None reported or observed  Perception WDL  Hallucination None reported or observed  Judgment Impaired  Confusion None  Danger to Self  Current suicidal ideation? Denies (Denies.)  Agreement Not to Harm Self Yes  Description of Agreement Verbal  Danger to Others  Danger to Others None reported or observed

## 2022-06-14 NOTE — [facility] Suicide Risk Assessment (Signed)
[facility] INPATIENT:  Family/Significant Other Suicide Prevention Education  Suicide Prevention Education:  Contact Attempts:  Hulda Marin, mother, [phone] has been identified by the patient as the family member/significant other with whom the patient will be residing, and identified as the person(s) who will aid the patient in the event of a mental health crisis.  With written consent from the patient, two attempts were made to provide suicide prevention education, prior to and/or following the patient's discharge.  We were unsuccessful in providing suicide prevention education.  A suicide education pamphlet was given to the patient to share with family/significant other.  Date and time of first attempt: 3:45 PM 06/14/2022 Date and time of second attempt: CSW will make additional attempts to reach collateral.   Durenda Hurt 06/14/2022, 3:45 PM

## 2022-06-14 NOTE — Group Note (Signed)
Recreation Therapy Group Note   Group Topic:Stress Management  Group Date: 06/14/2022 Start Time: 0930 End Time: 0950 Facilitators: Victorino Sparrow, LRT,CTRS Location: [address] Dayroom   Goal Area(s) Addresses:  Patient will actively participate in stress management techniques presented during session.  Patient will successfully identify benefit of practicing stress management post d/c.   Group Description:  Guided Imagery. LRT provided education, instruction, and demonstration on practice of visualization via guided imagery. Patient was asked to participate in the technique introduced during session. LRT debriefed including topics of mindfulness, stress management and specific scenarios each patient could use these techniques. Patients were given suggestions of ways to access scripts post d/c and encouraged to explore Youtube and other apps available on smartphones, tablets, and computers.   Affect/Mood: N/A   Participation Level: Did not attend    Clinical Observations/Individualized Feedback:     Plan: Continue to engage patient in RT group sessions 2-3x/week.   Victorino Sparrow, LRT,CTRS 06/14/2022 11:49 AM

## 2022-06-14 NOTE — Progress Notes (Signed)
Pt states she is still not feeling well. Appetite still decreased. Endorsing sweats, nausea,fatigue.

## 2022-06-14 NOTE — [facility] Group Notes (Signed)
[place] Group Notes:  (Nursing/MHT/Case Management/Adjunct)  Date:  06/14/2022  Time:  1:41 PM  Type of Therapy:  Psychoeducational Skills  Participation Level:  Did Not Attend  Summary of Progress/Problems:  Patient did not attend a psycho-educational group involving selfcare today.   Elza Rafter 06/14/2022, 1:41 PM

## 2022-06-15 NOTE — Progress Notes (Signed)
D. Pt presents with a sad affect/ depressed mood- friendly during interactions. Pt observed attending group led by SW this morning.  Pt reported some mild withdrawal symptoms ('feeling hot and cold') , but denied other symptoms, pain,  SI/HI and AVH   A. Labs and vitals monitored. Pt given and educated on medications. Pt supported emotionally and encouraged to express concerns and ask questions.   R. Pt remains safe with 15 minute checks. Will continue POC.

## 2022-06-15 NOTE — Progress Notes (Signed)
Psychoeducational Group Note  Date:  06/15/2022 Time:  2015  Group Topic/Focus:  Wrap up group  Participation Level: Did Not Attend  Participation Quality:  Not Applicable  Affect:  Not Applicable  Cognitive:  Not Applicable  Insight:  Not Applicable  Engagement in Group: Not Applicable  Additional Comments:  Did not attend.   Shellia Cleverly 06/15/2022, 9:10 PM

## 2022-06-15 NOTE — Group Note (Deleted)
LCSW Group Therapy Note   Group Date: 06/15/2022 Start Time: 1000 End Time: 1100   Type of Therapy and Topic:  Group Therapy:   Participation Level:  {[facility] PARTICIPATION YOMAY:04599}  Description of Group:   Therapeutic Goals:  1.     Summary of Patient Progress:    ***  Therapeutic Modalities:   Marquette Old 06/15/2022  9:41 AM

## 2022-06-15 NOTE — [facility] Group Notes (Signed)
.  Psychoeducational Group Note  Date: 06/15/2022 Time: 0900-1000    Goal Setting   Purpose of Group: This group helps to provide patients with the steps of setting a goal that is specific, measurable, attainable, realistic and time specific. A discussion on how we keep ourselves stuck with negative self talk. Homework given for Patients to write 30 positive attributes about themselves.    Participation Level:  did not attend  Paulino Rily
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
## 2022-06-15 NOTE — Progress Notes (Signed)
   06/15/22 2224  Psych Admission Type (Psych Patients Only)  Admission Status Voluntary  Psychosocial Assessment  Patient Complaints Depression  Eye Contact Brief  Facial Expression Flat  Affect Appropriate to circumstance  Speech Logical/coherent  Interaction Minimal  Motor Activity Other (Comment) (WDL)  Appearance/Hygiene Unremarkable  Behavior Characteristics Appropriate to situation  Mood Pleasant  Thought Process  Coherency WDL  Content WDL  Delusions None reported or observed  Perception WDL  Hallucination None reported or observed  Judgment Impaired  Confusion None  Danger to Self  Current suicidal ideation? Denies  Self-Injurious Behavior No self-injurious ideation or behavior indicators observed or expressed   Agreement Not to Harm Self Yes  Description of Agreement verbal  Danger to Others  Danger to Others None reported or observed

## 2022-06-15 NOTE — [facility] Group Notes (Signed)
    Purpose of Group: . The group focus' on teaching patients on how to identify their needs and their Life Skills:  A group where two lists are made. What people need and what are things that we do that are unhealthy. The lists are developed by the patients and it is explained that we often do the actions that are not healthy to get our list of needs met.  Goal:: to develop the coping skills needed to get their needs met  Participation Level:  Did not attend  Additional Comments:

## 2022-06-15 NOTE — Progress Notes (Signed)
[facility] MD Progress Note  06/15/2022 11:00 AM Taylor Wiggins  MRN:  [id number] Subjective:     Taylor Wiggins is a 41 yo patient w/ PPH of polysubstance abuse (including hx of opioid use disorder, cocaine use disorder, and THC use disorder) , substance induced mood disorder, substance induced psychosis,  GAD and ADHD. Patient presented to [facility] via [facility] after endorsing SI when her husband dropped her at a [employer] and told her he did not want to see her again. Patient appeared paranoid at [facility] but did not require antipsychotic. Patient did start Lexapro.    Case was discussed in the multidisciplinary team. MAR was reviewed and patient was compliant with medications.  She required PRN vistaril for anxiety  Per staff patient is having withdrawal symptoms and is not doing much beyond laying in bed.  She did not attend morning group.  On assessment this AM patient reports she feels tired and still feels depressed and anxious.  She reports stable appetite.  She did not sleep well last night and was having withdrawal symptoms including hot and cold chills.  She reports that Lamictal has been helping her with her depression.  She reports using marijuana regularly and drinks 6 packs of beer every day.  She denies any cravings at this time.  She reports that her withdrawal tremors comes and goes. She wants to go to rehab preferably for 28 days but is not sure if her insurance covers it or not.  She has "Friday" insurance.  She reports that she is currently homeless but can go to her mom's place in [facility] but she does not want to delay her rehab placement.  Patient denies SI, HI, and AVH as well as paranoia or delusions.  She has been attending some groups.  She denies any other physical symptoms other than hot and cold chills and tiredness.  Principal Problem: Substance induced mood disorder (HCC) Diagnosis: Principal Problem:   Substance induced mood disorder (HCC) Active Problems:   Tobacco abuse    Methamphetamine abuse ([place])   History of opioid abuse ([place])   Cannabis use disorder, moderate, dependence ([place])  Total Time spent with patient: 20 minutes  Past Psychiatric History: See H&P  Past Medical History:  Past Medical History:  Diagnosis Date   Adult ADHD    Anxiety    Asthma    IBS (irritable bowel syndrome)    Opioid use disorder    No longer on buprenorphine therapy   PTSD (post-traumatic stress disorder)     Past Surgical History:  Procedure Laterality Date   BREAST REDUCTION SURGERY  2005   Family History: History reviewed. No pertinent family history. Family Psychiatric  History: See H&P Social History:  Social History   Substance and Sexual Activity  Alcohol Use No     Social History   Substance and Sexual Activity  Drug Use Yes   Types: Benzodiazepines, Marijuana, Methamphetamines   Comment: recently    Social History   Socioeconomic History   Marital status: Single    Spouse name: Not on file   Number of children: Not on file   Years of education: Not on file   Highest education level: Not on file  Occupational History   Not on file  Tobacco Use   Smoking status: Every Day    Packs/day: 1.00    Types: Cigarettes    Last attempt to quit: 09/06/2014    Years since quitting: 7.7   Smokeless tobacco: Never  Substance  and Sexual Activity   Alcohol use: No   Drug use: Yes    Types: Benzodiazepines, Marijuana, Methamphetamines    Comment: recently   Sexual activity: Never    Birth control/protection: Abstinence  Other Topics Concern   Not on file  Social History Narrative   41 y/o Caucasian female. Married and homeless with polysubstance abuse. Per pt-Graduated from [employer] in 2006 with bachelors degree in [place].   Social Determinants of Health   Financial Resource Strain: Not on file  Food Insecurity: Not on file  Transportation Needs: Not on file  Physical Activity: Not on file  Stress: Not on file  Social  Connections: Not on file   Additional Social History:                         Sleep: Fair  Appetite:  Fair  Current Medications: Current Facility-Administered Medications  Medication Dose Route Frequency Provider Last Rate Last Admin   acetaminophen (TYLENOL) tablet 650 mg  650 mg Oral Q6H PRN Revonda Humphrey, NP   650 mg at 06/12/22 2110   alum & mag hydroxide-simeth (MAALOX/MYLANTA) 200-200-20 MG/5ML suspension 30 mL  30 mL Oral Q4H PRN Revonda Humphrey, NP       chlordiazePOXIDE (LIBRIUM) capsule 25 mg  25 mg Oral TID Damita Dunnings B, MD   25 mg at 06/15/22 0835   Followed by   Derrill Memo ON 06/16/2022] chlordiazePOXIDE (LIBRIUM) capsule 25 mg  25 mg Oral BH-qamhs Damita Dunnings B, MD       Followed by   Derrill Memo ON 06/17/2022] chlordiazePOXIDE (LIBRIUM) capsule 25 mg  25 mg Oral Daily Damita Dunnings B, MD       feeding supplement (ENSURE ENLIVE / ENSURE PLUS) liquid 237 mL  237 mL Oral BID BM Massengill, Ovid Curd, MD   237 mL at 06/14/22 1730   hydrOXYzine (ATARAX) tablet 25 mg  25 mg Oral TID PRN Revonda Humphrey, NP   25 mg at 06/14/22 2146   hydrOXYzine (ATARAX) tablet 25 mg  25 mg Oral Q6H PRN Damita Dunnings B, MD       lamoTRIgine (LAMICTAL) tablet 25 mg  25 mg Oral Daily Damita Dunnings B, MD   25 mg at 06/15/22 0835   loperamide (IMODIUM) capsule 2-4 mg  2-4 mg Oral PRN Damita Dunnings B, MD       magnesium hydroxide (MILK OF MAGNESIA) suspension 30 mL  30 mL Oral Daily PRN Revonda Humphrey, NP       multivitamin with minerals tablet 1 tablet  1 tablet Oral Daily Damita Dunnings B, MD   1 tablet at 06/15/22 0834   nicotine polacrilex (NICORETTE) gum 2 mg  2 mg Oral PRN Damita Dunnings B, MD       ondansetron (ZOFRAN-ODT) disintegrating tablet 4 mg  4 mg Oral Q6H PRN Freida Busman, MD       pantoprazole (PROTONIX) EC tablet 40 mg  40 mg Oral Daily Thomes Lolling H, NP   40 mg at 06/15/22 3149   thiamine tablet 100 mg  100 mg Oral Daily Damita Dunnings B, MD   100 mg at 06/15/22  0835   traZODone (DESYREL) tablet 50 mg  50 mg Oral QHS PRN Revonda Humphrey, NP        Lab Results: No results found for this or any previous visit (from the past 67 hour(s)).  Blood Alcohol level:  Lab Results  Component Value  Date   ETH <10 06/09/2022   ETH <10 36/14/4315    Metabolic Disorder Labs: Lab Results  Component Value Date   HGBA1C 5.4 06/11/2022   MPG 108.28 06/11/2022   No results found for: "PROLACTIN" Lab Results  Component Value Date   CHOL 168 06/11/2022   TRIG 107 06/11/2022   HDL 60 06/11/2022   CHOLHDL 2.8 06/11/2022   VLDL 21 06/11/2022   LDLCALC 87 06/11/2022    Physical Findings: AIMS: Facial and Oral Movements Muscles of Facial Expression: None, normal Lips and Perioral Area: None, normal Jaw: None, normal Tongue: None, normal,Extremity Movements Upper (arms, wrists, hands, fingers): None, normal Lower (legs, knees, ankles, toes): None, normal, Trunk Movements Neck, shoulders, hips: None, normal, Overall Severity Severity of abnormal movements (highest score from questions above): None, normal Incapacitation due to abnormal movements: None, normal Patient's awareness of abnormal movements (rate only patient's report): No Awareness, Dental Status Current problems with teeth and/or dentures?: No Does patient usually wear dentures?: No  CIWA:  CIWA-Ar Total: 2 COWS:     Musculoskeletal: Strength & Muscle Tone: within normal limits Gait & Station: normal Patient leans: N/A  Psychiatric Specialty Exam:  Presentation  General Appearance: Casual  Eye Contact:Fair  Speech:Clear and Coherent  Speech Volume:Normal  Handedness:Right   Mood and Affect  Mood:Depressed (Tired)  Affect:Congruent; Depressed   Thought Process  Thought Processes:Goal Directed; Coherent  Descriptions of Associations:Intact  Orientation:Full (Time, Place and Person)  Thought Content:Logical  History of Schizophrenia/Schizoaffective  disorder:No  Duration of Psychotic Symptoms:Less than six months  Hallucinations:Hallucinations: None  Ideas of Reference:None  Suicidal Thoughts:Suicidal Thoughts: No SI Active Intent and/or Plan: -- (Denies it today) SI Passive Intent and/or Plan: -- (Denies)  Homicidal Thoughts:Homicidal Thoughts: No   Sensorium  Memory:Immediate Fair; Recent Fair  Judgment:Poor  Insight:Shallow   Executive Functions  Concentration:Fair  Attention Span:Fair  [place]  Language:Good   Psychomotor Activity  Psychomotor Activity:Psychomotor Activity: Decreased   Assets  Assets:Desire for Improvement; Resilience; Social Support   Sleep  Sleep:Sleep: Fair    Physical Exam: Physical Exam HENT:     Head: Normocephalic and atraumatic.  Pulmonary:     Effort: Pulmonary effort is normal.  Neurological:     Mental Status: She is alert and oriented to person, place, and time.    Review of Systems  Constitutional:  Positive for chills and malaise/fatigue.  Gastrointestinal:  Negative for abdominal pain, constipation, diarrhea, nausea and vomiting.  Neurological:  Negative for tingling.  Psychiatric/Behavioral:  Positive for depression. Negative for hallucinations and suicidal ideas. The patient has insomnia.    Blood pressure 91/63, pulse 91, temperature 98.4 F (36.9 C), temperature source Oral, resp. rate 18, height '5\' 7"'$  (1.702 m), weight 59.9 kg, last menstrual period 05/28/2022, SpO2 100 %. Body mass index is 20.67 kg/m.   Treatment Plan Summary: Daily contact with patient to assess and evaluate symptoms and progress in treatment and Medication management Patient is still having some withdrawal symptoms.  She was started on Librium taper yesterday.  She has been participating some groups.  Patient wants to go to rehab after detox.  Will continue with same treatment and ask CSW for rehab placement.  Labs Reviewed: TSH- WNL, Lipid- WNL, A1c-  WNL, CMP: ALT 49, CBC- WNL, UA: rare bacteria and UDS-  +TCH and meth   EKG: Qtc- 436 NSR, HR 76   Substance induced mood disorder( R/O substance induced psychosis) Stimulant use disorder, severe methamphetamines Hx of  opioid use disorder -- Continue Lamictal '25mg'$ , for impuslivity and mood instability  Etoh use disorder, severe - Continue CIWA w/ Librium taper scheduled  -- Last CIWA: 2  --CSW to help with rehab placement.  Tobacco use disorder, moderate - Nicorette gum     PGY-2 Armando Reichert, MD 06/15/2022, 11:00 AM

## 2022-06-15 NOTE — Group Note (Signed)
Date:  06/15/2022 Time:  9:39 AM  Group Topic/Focus:  Orientation:   The focus of this group is to educate the patient on the purpose and policies of crisis stabilization and provide a format to answer questions about their admission.  The group details unit policies and expectations of patients while admitted.    Participation Level:  Did Not Attend  Participation Quality:    Affect:    Cognitive:    Insight:   Engagement in Group:    Modes of Intervention:    Additional Comments:  Pt did not attend group after encouragement  Garvin Fila 06/15/2022, 9:39 AM

## 2022-06-16 NOTE — Progress Notes (Signed)
Psychoeducational Group Note  Date:  06/16/2022 Time:  2015  Group Topic/Focus:  Wrap up group  Participation Level: Did Not Attend  Participation Quality:  Not Applicable  Affect:  Not Applicable  Cognitive:  Not Applicable  Insight:  Not Applicable  Engagement in Group: Not Applicable  Additional Comments:  Did not attend.   Shellia Cleverly 06/16/2022, 9:20 PM

## 2022-06-16 NOTE — Progress Notes (Signed)
   06/16/22 2234  Psych Admission Type (Psych Patients Only)  Admission Status Voluntary  Psychosocial Assessment  Patient Complaints None  Eye Contact Brief  Facial Expression Animated  Affect Appropriate to circumstance  Speech Logical/coherent  Interaction Minimal  Motor Activity Other (Comment) (WDL)  Appearance/Hygiene Unremarkable  Behavior Characteristics Appropriate to situation  Mood Pleasant  Thought Process  Coherency WDL  Content WDL  Delusions None reported or observed  Perception WDL  Hallucination None reported or observed  Judgment Impaired  Confusion None  Danger to Self  Current suicidal ideation? Denies  Self-Injurious Behavior No self-injurious ideation or behavior indicators observed or expressed   Agreement Not to Harm Self Yes  Description of Agreement verbal  Danger to Others  Danger to Others None reported or observed

## 2022-06-16 NOTE — Progress Notes (Addendum)
   06/16/22 1600  Psych Admission Type (Psych Patients Only)  Admission Status Voluntary  Psychosocial Assessment  Patient Complaints Anxiety;Depression  Eye Contact Brief  Facial Expression Sad  Affect Appropriate to circumstance  Speech Logical/coherent  Interaction Minimal  Motor Activity Other (Comment) (steady gait)  Appearance/Hygiene Unremarkable  Behavior Characteristics Appropriate to situation;Cooperative  Mood Pleasant  Aggressive Behavior  Effect No apparent injury  Thought Process  Coherency WDL  Content WDL  Delusions None reported or observed  Perception WDL  Hallucination None reported or observed  Judgment Impaired  Confusion None  Danger to Self  Current suicidal ideation? Denies  Self-Injurious Behavior No self-injurious ideation or behavior indicators observed or expressed   Agreement Not to Harm Self Yes  Description of Agreement verbal  Danger to Others  Danger to Others None reported or observed    D. Pt continues to present with a sad affect, but brightens and is polite during interactions. Per pt's self inventory, pt rated her depression,hopelessness and anxiety a 7/7/6, respectively.  Pt currently denies SI/HI and AVH  A. Labs and vitals monitored. Pt supported emotionally and encouraged to express concerns and ask questions.   R. Pt remains safe with 15 minute checks. Will continue POC.

## 2022-06-16 NOTE — [facility] Group Notes (Signed)
.  Psychoeducational Group Note    Date:  6/17//23 Time: 1300-1400    Purpose of Group: . The group focus' on teaching patients on how to identify their needs and their Life Skills:  A group where two lists are made. What people need and what are things that we do that are unhealthy. The lists are developed by the patients and it is explained that we often do the actions that are not healthy to get our list of needs met.  Goal:: to develop the coping skills needed to get their needs met  Participation Level:  did not attend  Taylor Wiggins

## 2022-06-16 NOTE — [facility] Group Notes (Signed)
Adult Psychoeducational Group Not Date:  06/16/2022 Time:  0900-1045 Group Topic/Focus: PROGRESSIVE RELAXATION. A group where deep breathing is taught and tensing and relaxation muscle groups is used. Imagery is used as well.  Pts are asked to imagine 3 pillars that hold them up when they are not able to hold themselves up and to share that with the group.  Participation Level: did not attend  Paulino Rily

## 2022-06-16 NOTE — Group Note (Signed)
[facility] Group Notes: (Clinical Social Work)   06/16/2022      Type of Therapy:  Group Therapy   Participation Level:  Did Not Attend - was invited both individually by MHT and by overhead announcement, chose not to attend.   Selmer Dominion, LCSW 06/16/2022, 11:04 AM

## 2022-06-16 NOTE — Progress Notes (Signed)
[facility] MD Progress Note  06/16/2022 8:42 AM Taylor Wiggins  MRN:  [id number] Subjective:     Taylor Wiggins is a 41 yo patient w/ PPH of polysubstance abuse (including hx of opioid use disorder, cocaine use disorder, and THC use disorder) , substance induced mood disorder, substance induced psychosis,  GAD and ADHD. Patient presented to [facility] via [facility] after endorsing SI when her husband dropped her at a [employer] and told her he did not want to see her again. Patient appeared paranoid at [facility] but did not require antipsychotic. Patient did start Lexapro.    Case was discussed in the multidisciplinary team. MAR was reviewed and patient was compliant with medications but refused Ensure.  She didn't require any PRN  meds yesterday. Per staff patient has not been attending groups.  Her most recent CIWA was 2.  Patient slept 5.75 hours last night.  Per CSW, patient came to group yesterday, stayed there for first half an hour, did not participate and then left.  On assessment this AM patient reports her mood is better and reports some improvement in depression and anxiety.  Patient affect is brighter than yesterday but still constricted.  She reports that her appetite is improving.  She did not sleep well last night and was up and down and was having some dreams.  She reports that Lamictal has been helping her with her depression.  She reports using marijuana regularly and drinks 6 packs of beer every day.  She denies any cravings at this time.  She is still reporting some withdrawal symptoms including feeling hot and cold. She is asking to go to rehab as soon as possible.  Discussed that CSW will inquire about inpatient rehabs on Monday.  Currently, patient denies SI, HI, and AVH as well as paranoia or delusions.  She reports she has been attending some groups.  Encouraged her to attend all groups.  She denies any other physical symptoms other than feeling little dizzy likely due to Librium.  Principal Problem:  Substance induced mood disorder (HCC) Diagnosis: Principal Problem:   Substance induced mood disorder (HCC) Active Problems:   Tobacco abuse   Methamphetamine abuse ([place])   History of opioid abuse ([place])   Cannabis use disorder, moderate, dependence ([place])  Total Time spent with patient: 20 minutes  Past Psychiatric History: See H&P  Past Medical History:  Past Medical History:  Diagnosis Date   Adult ADHD    Anxiety    Asthma    IBS (irritable bowel syndrome)    Opioid use disorder    No longer on buprenorphine therapy   PTSD (post-traumatic stress disorder)     Past Surgical History:  Procedure Laterality Date   BREAST REDUCTION SURGERY  2005   Family History: History reviewed. No pertinent family history. Family Psychiatric  History: See H&P Social History:  Social History   Substance and Sexual Activity  Alcohol Use No     Social History   Substance and Sexual Activity  Drug Use Yes   Types: Benzodiazepines, Marijuana, Methamphetamines   Comment: recently    Social History   Socioeconomic History   Marital status: Single    Spouse name: Not on file   Number of children: Not on file   Years of education: Not on file   Highest education level: Not on file  Occupational History   Not on file  Tobacco Use   Smoking status: Every Day    Packs/day: 1.00    Types: Cigarettes  Last attempt to quit: 09/06/2014    Years since quitting: 7.7   Smokeless tobacco: Never  Substance and Sexual Activity   Alcohol use: No   Drug use: Yes    Types: Benzodiazepines, Marijuana, Methamphetamines    Comment: recently   Sexual activity: Never    Birth control/protection: Abstinence  Other Topics Concern   Not on file  Social History Narrative   41 y/o Caucasian female. Married and homeless with polysubstance abuse. Per pt-Graduated from [employer] in 2006 with bachelors degree in [place].   Social Determinants of Health   Financial Resource  Strain: Not on file  Food Insecurity: Not on file  Transportation Needs: Not on file  Physical Activity: Not on file  Stress: Not on file  Social Connections: Not on file   Additional Social History:                         Sleep: Fair  Appetite:  Fair improving  Current Medications: Current Facility-Administered Medications  Medication Dose Route Frequency Provider Last Rate Last Admin   acetaminophen (TYLENOL) tablet 650 mg  650 mg Oral Q6H PRN Revonda Humphrey, NP   650 mg at 06/12/22 2110   alum & mag hydroxide-simeth (MAALOX/MYLANTA) 200-200-20 MG/5ML suspension 30 mL  30 mL Oral Q4H PRN Revonda Humphrey, NP       chlordiazePOXIDE (LIBRIUM) capsule 25 mg  25 mg Oral BH-qamhs Damita Dunnings B, MD   25 mg at 06/16/22 8242   Followed by   Derrill Memo ON 06/17/2022] chlordiazePOXIDE (LIBRIUM) capsule 25 mg  25 mg Oral Daily Damita Dunnings B, MD       feeding supplement (ENSURE ENLIVE / ENSURE PLUS) liquid 237 mL  237 mL Oral BID BM Massengill, Ovid Curd, MD   237 mL at 06/15/22 1134   hydrOXYzine (ATARAX) tablet 25 mg  25 mg Oral TID PRN Revonda Humphrey, NP   25 mg at 06/14/22 2146   lamoTRIgine (LAMICTAL) tablet 25 mg  25 mg Oral Daily Damita Dunnings B, MD   25 mg at 06/16/22 0735   magnesium hydroxide (MILK OF MAGNESIA) suspension 30 mL  30 mL Oral Daily PRN Revonda Humphrey, NP       multivitamin with minerals tablet 1 tablet  1 tablet Oral Daily Damita Dunnings B, MD   1 tablet at 06/16/22 0736   nicotine polacrilex (NICORETTE) gum 2 mg  2 mg Oral PRN Freida Busman, MD       pantoprazole (PROTONIX) EC tablet 40 mg  40 mg Oral Daily Thomes Lolling H, NP   40 mg at 06/16/22 0736   thiamine tablet 100 mg  100 mg Oral Daily Damita Dunnings B, MD   100 mg at 06/16/22 0736   traZODone (DESYREL) tablet 50 mg  50 mg Oral QHS PRN Revonda Humphrey, NP        Lab Results: No results found for this or any previous visit (from the past 28 hour(s)).  Blood Alcohol level:  Lab  Results  Component Value Date   ETH <10 06/09/2022   ETH <10 35/36/1443    Metabolic Disorder Labs: Lab Results  Component Value Date   HGBA1C 5.4 06/11/2022   MPG 108.28 06/11/2022   No results found for: "PROLACTIN" Lab Results  Component Value Date   CHOL 168 06/11/2022   TRIG 107 06/11/2022   HDL 60 06/11/2022   CHOLHDL 2.8 06/11/2022   VLDL 21  06/11/2022   LDLCALC 87 06/11/2022    Physical Findings: AIMS: Facial and Oral Movements Muscles of Facial Expression: None, normal Lips and Perioral Area: None, normal Jaw: None, normal Tongue: None, normal,Extremity Movements Upper (arms, wrists, hands, fingers): None, normal Lower (legs, knees, ankles, toes): None, normal, Trunk Movements Neck, shoulders, hips: None, normal, Overall Severity Severity of abnormal movements (highest score from questions above): None, normal Incapacitation due to abnormal movements: None, normal Patient's awareness of abnormal movements (rate only patient's report): No Awareness, Dental Status Current problems with teeth and/or dentures?: No Does patient usually wear dentures?: No  CIWA:  CIWA-Ar Total: 2 COWS:     Musculoskeletal: Strength & Muscle Tone: within normal limits Gait & Station: normal Patient leans: N/A  Psychiatric Specialty Exam:  Presentation  General Appearance: Casual  Eye Contact:Fair  Speech:Clear and Coherent  Speech Volume:Normal  Handedness:Right   Mood and Affect  Mood:Anxious; Dysphoric  Affect:Congruent; Constricted   Thought Process  Thought Processes:Goal Directed  Descriptions of Associations:Intact  Orientation:Full (Time, Place and Person)  Thought Content:Logical  History of Schizophrenia/Schizoaffective disorder:No  Duration of Psychotic Symptoms:Less than six months  Hallucinations:Hallucinations: None  Ideas of Reference:None  Suicidal Thoughts:Suicidal Thoughts: No SI Active Intent and/or Plan: -- (denies) SI Passive  Intent and/or Plan: -- (denies)  Homicidal Thoughts:Homicidal Thoughts: No   Sensorium  Memory:Immediate Fair; Recent Fair  Judgment:-- (improving)  Insight:Shallow   Executive Functions  Concentration:Fair  Attention Span:Fair  [place]  Language:Good   Psychomotor Activity  Psychomotor Activity:Psychomotor Activity: Normal   Assets  Assets:Desire for Improvement; Resilience; Social Support   Sleep  Sleep:Sleep: Fair Number of Hours of Sleep: 5.75    Physical Exam: Physical Exam HENT:     Head: Normocephalic and atraumatic.  Pulmonary:     Effort: Pulmonary effort is normal.  Neurological:     Mental Status: She is alert and oriented to person, place, and time.    Review of Systems  Constitutional:  Positive for chills.  Gastrointestinal:  Negative for abdominal pain, constipation, diarrhea, nausea and vomiting.  Neurological:  Positive for dizziness. Negative for tingling.  Psychiatric/Behavioral:  Positive for depression. Negative for hallucinations and suicidal ideas. The patient is nervous/anxious and has insomnia.    Blood pressure 104/78, pulse 93, temperature 98.2 F (36.8 C), temperature source Oral, resp. rate 16, height '5\' 7"'$  (1.702 m), weight 59.9 kg, last menstrual period 05/28/2022, SpO2 99 %. Body mass index is 20.67 kg/m.   Treatment Plan Summary: Daily contact with patient to assess and evaluate symptoms and progress in treatment and Medication management Patient is still having some withdrawal symptoms but reports improvement in her depression and anxiety. Librium taper will finish tomorrow.  Encouraged her to attend and participate in all groups.  CSW will inquire about inpatient rehabs on Monday.  Will not make any changes in her medications today.  Labs Reviewed: TSH- WNL, Lipid- WNL, A1c- WNL, CMP: ALT 49, CBC- WNL, UA: rare bacteria and UDS-  +TCH and meth   EKG: Qtc- 436 NSR, HR 76   Substance  induced mood disorder( R/O substance induced psychosis) Stimulant use disorder, severe methamphetamines Hx of opioid use disorder -- Continue Lamictal '25mg'$ , for impuslivity and mood instability  Etoh use disorder, severe - Continue CIWA w/ Librium taper scheduled (taper finishing on Monday 6/19)  -- Last CIWA: 2  --CSW to help with inpatient rehab placement.  Tobacco use disorder, moderate - Nicorette gum     PGY-2 Susan Arana  Rosita Kea, MD 06/16/2022, 8:42 AM

## 2022-06-16 NOTE — BHH Group Notes (Signed)
[facility] Group Notes:  (Nursing/MHT/Case Management/Adjunct)  Date:  06/16/2022  Time:  9:57 AM  Type of Therapy:  Group Therapy   Participation Level:  Did Not Attend   Summary of Progress/Problems:   Patient did not attend orientation/ goals group today.   Elza Rafter 06/16/2022, 9:57 AM

## 2022-06-17 ENCOUNTER — Encounter (HOSPITAL_COMMUNITY): Payer: Self-pay

## 2022-06-17 MED ORDER — CHLORDIAZEPOXIDE HCL 25 MG PO CAPS
25.0000 mg | ORAL_CAPSULE | Freq: Once | ORAL | Status: AC
Start: 2022-06-17 — End: 2022-06-17
  Administered 2022-06-17: 25 mg via ORAL
  Filled 2022-06-17: qty 1

## 2022-06-17 MED ORDER — ATOMOXETINE HCL 40 MG PO CAPS
40.0000 mg | ORAL_CAPSULE | Freq: Every day | ORAL | Status: DC
  Administered 2022-06-18 – 2022-06-19 (×2): 40 mg via ORAL
  Filled 2022-06-17 (×3): qty 1

## 2022-06-17 NOTE — Group Note (Signed)
Recreation Therapy Group Note   Group Topic:Problem Solving  Group Date: 06/17/2022 Start Time: 0930 End Time: 1000 Facilitators: Victorino Sparrow, LRT,CTRS Location: [address] Dayroom   Goal Area(s) Addresses:  Patient will effectively work with peer towards shared goal.  Patient will identify skills used to make activity successful.  Patient will share challenges and verbalize solution-driven approaches used. Patient will identify how skills used during activity can be used to reach post d/c goals.   Group Description:  [employer]. Patients were provided the following materials: 2 drinking straws, 5 rubber bands, 5 paper clips, 2 index cards and 2 drinking cups. Using the provided materials patients were asked to build a launching mechanism to launch a ping pong ball across the room, approximately 10 feet. Patients were divided into teams of 3-5. Instructions required all materials be incorporated into the device, functionality of items left to the peer group's discretion.   Affect/Mood: Flat   Participation Level: Minimal   Participation Quality: Independent   Behavior: Appropriate   Speech/Thought Process: Distracted   Insight: Good   Judgement: Good   Modes of Intervention: Problem-solving   Patient Response to Interventions:  Attentive   Education Outcome:  Acknowledges education and In group clarification offered    Clinical Observations/Individualized Feedback: Pt was observant.  Pt left due to not feeling well.     Plan: Continue to engage patient in RT group sessions 2-3x/week.   Victorino Sparrow, LRT,CTRS 06/17/2022 1:19 PM

## 2022-06-17 NOTE — BH IP Treatment Plan (Signed)
Interdisciplinary Treatment and Diagnostic Plan Update  06/17/2022 Time of Session: 0830 Taylor Wiggins MRN: [id number]  Principal Diagnosis: Substance induced mood disorder ([place])  Secondary Diagnoses: Principal Problem:   Substance induced mood disorder ([place]) Active Problems:   Tobacco abuse   Methamphetamine abuse ([place])   History of opioid abuse ([place])   Cannabis use disorder, moderate, dependence ([place])   Current Medications:  Current Facility-Administered Medications  Medication Dose Route Frequency Provider Last Rate Last Admin   acetaminophen (TYLENOL) tablet 650 mg  650 mg Oral Q6H PRN Revonda Humphrey, NP   650 mg at 06/12/22 2110   alum & mag hydroxide-simeth (MAALOX/MYLANTA) 200-200-20 MG/5ML suspension 30 mL  30 mL Oral Q4H PRN Revonda Humphrey, NP       feeding supplement (ENSURE ENLIVE / ENSURE PLUS) liquid 237 mL  237 mL Oral BID BM Massengill, Ovid Curd, MD   237 mL at 06/16/22 1528   hydrOXYzine (ATARAX) tablet 25 mg  25 mg Oral TID PRN Revonda Humphrey, NP   25 mg at 06/16/22 1835   lamoTRIgine (LAMICTAL) tablet 25 mg  25 mg Oral Daily Damita Dunnings B, MD   25 mg at 06/17/22 0818   magnesium hydroxide (MILK OF MAGNESIA) suspension 30 mL  30 mL Oral Daily PRN Revonda Humphrey, NP       multivitamin with minerals tablet 1 tablet  1 tablet Oral Daily Damita Dunnings B, MD   1 tablet at 06/17/22 0818   nicotine polacrilex (NICORETTE) gum 2 mg  2 mg Oral PRN Freida Busman, MD       pantoprazole (PROTONIX) EC tablet 40 mg  40 mg Oral Daily Thomes Lolling H, NP   40 mg at 06/17/22 0818   thiamine tablet 100 mg  100 mg Oral Daily Damita Dunnings B, MD   100 mg at 06/17/22 0818   traZODone (DESYREL) tablet 50 mg  50 mg Oral QHS PRN Revonda Humphrey, NP       PTA Medications: Medications Prior to Admission  Medication Sig Dispense Refill Last Dose   clonazePAM (KLONOPIN) 1 MG tablet Take 1 tablet by mouth 2 (two) times daily.      escitalopram (LEXAPRO) 5 MG tablet  Take 1 tablet (5 mg total) by mouth daily.      omeprazole (PRILOSEC) 20 MG capsule Take 1 capsule (20 mg total) by mouth daily. (Patient taking differently: Take 20 mg by mouth daily as needed (For heartburn or acid reflux).) 30 capsule 0     Patient Stressors: Financial difficulties   Medication change or noncompliance   Occupational concerns   Substance abuse    Patient Strengths: Average or above average intelligence  Communication skills  General fund of knowledge  Motivation for treatment/growth   Treatment Modalities: Medication Management, Group therapy, Case management,  1 to 1 session with clinician, Psychoeducation, Recreational therapy.   Physician Treatment Plan for Primary Diagnosis: Substance induced mood disorder ([place]) Long Term Goal(s): Improvement in symptoms so as ready for discharge   Short Term Goals: Ability to identify changes in lifestyle to reduce recurrence of condition will improve Ability to verbalize feelings will improve Ability to disclose and discuss suicidal ideas Ability to demonstrate self-control will improve Ability to identify and develop effective coping behaviors will improve Ability to maintain clinical measurements within normal limits will improve Compliance with prescribed medications will improve Ability to identify triggers associated with substance abuse/mental health issues will improve  Medication Management: Evaluate patient's response,  side effects, and tolerance of medication regimen.  Therapeutic Interventions: 1 to 1 sessions, Unit Group sessions and Medication administration.  Evaluation of Outcomes: Progressing  Physician Treatment Plan for Secondary Diagnosis: Principal Problem:   Substance induced mood disorder ([place]) Active Problems:   Tobacco abuse   Methamphetamine abuse (HCC)   History of opioid abuse ([place])   Cannabis use disorder, moderate, dependence ([place])  Long Term Goal(s): Improvement in symptoms so as ready  for discharge   Short Term Goals: Ability to identify changes in lifestyle to reduce recurrence of condition will improve Ability to verbalize feelings will improve Ability to disclose and discuss suicidal ideas Ability to demonstrate self-control will improve Ability to identify and develop effective coping behaviors will improve Ability to maintain clinical measurements within normal limits will improve Compliance with prescribed medications will improve Ability to identify triggers associated with substance abuse/mental health issues will improve     Medication Management: Evaluate patient's response, side effects, and tolerance of medication regimen.  Therapeutic Interventions: 1 to 1 sessions, Unit Group sessions and Medication administration.  Evaluation of Outcomes: Progressing   RN Treatment Plan for Primary Diagnosis: Substance induced mood disorder ([place]) Long Term Goal(s): Knowledge of disease and therapeutic regimen to maintain health will improve  Short Term Goals: Ability to remain free from injury will improve, Ability to verbalize frustration and anger appropriately will improve, Ability to demonstrate self-control, Ability to participate in decision making will improve, Ability to verbalize feelings will improve, Ability to disclose and discuss suicidal ideas, Ability to identify and develop effective coping behaviors will improve, and Compliance with prescribed medications will improve  Medication Management: RN will administer medications as ordered by provider, will assess and evaluate patient's response and provide education to patient for prescribed medication. RN will report any adverse and/or side effects to prescribing provider.  Therapeutic Interventions: 1 on 1 counseling sessions, Psychoeducation, Medication administration, Evaluate responses to treatment, Monitor vital signs and CBGs as ordered, Perform/monitor CIWA, COWS, AIMS and Fall Risk screenings as ordered,  Perform wound care treatments as ordered.  Evaluation of Outcomes: Progressing   LCSW Treatment Plan for Primary Diagnosis: Substance induced mood disorder ([place]) Long Term Goal(s): Safe transition to appropriate next level of care at discharge, Engage patient in therapeutic group addressing interpersonal concerns.  Short Term Goals: Engage patient in aftercare planning with referrals and resources, Increase social support, Increase ability to appropriately verbalize feelings, Increase emotional regulation, Facilitate acceptance of mental health diagnosis and concerns, Facilitate patient progression through stages of change regarding substance use diagnoses and concerns, Identify triggers associated with mental health/substance abuse issues, and Increase skills for wellness and recovery  Therapeutic Interventions: Assess for all discharge needs, 1 to 1 time with Social worker, Explore available resources and support systems, Assess for adequacy in community support network, Educate family and significant other(s) on suicide prevention, Complete Psychosocial Assessment, Interpersonal group therapy.  Evaluation of Outcomes: Progressing   Progress in Treatment: Attending groups: No. Participating in groups: No. Taking medication as prescribed: Yes. Toleration medication: Yes. Family/Significant other contact made: Yes, individual(s) contacted:  SPE completed with Duwayne Heck, mother. Patient understands diagnosis: Yes. Discussing patient identified problems/goals with staff: Yes. Medical problems stabilized or resolved: Yes. Denies suicidal/homicidal ideation: No. Issues/concerns per patient self-inventory: Yes. Other: none  New problem(s) identified: No, Describe:  none  New Short Term/Long Term Goal(s): Patient to work towards detox, elimination of symptoms of psychosis, medication management for mood stabilization; elimination of SI thoughts; development of comprehensive mental  wellness/sobriety plan.  Patient Goals: No additional goals identified at this time. Patient to continue to work towards original goals identified in initial treatment team meeting. CSW will remain available to patient should they voice additional treatment goals.   Discharge Plan or Barriers: Patient is homeless, currently interested in substance use inpatient treatment. CSW to discuss options, patient has inadequate insurance.   Reason for Continuation of Hospitalization: Anxiety Depression  Estimated Length of Stay: 1-7 days  Scribe for Treatment Team: Larose Kells 06/17/2022 12:30 PM

## 2022-06-17 NOTE — Group Note (Signed)
LCSW Group Therapy Note  Group Date: 06/17/2022 Start Time: 1300 End Time: 1400   Type of Therapy and Topic:  Group Therapy: Anger Cues and Responses  Participation Level:  Minimal   Description of Group:   In this group, patients learned how to recognize the physical, cognitive, emotional, and behavioral responses they have to anger-provoking situations.  They identified a recent time they became angry and how they reacted.  They analyzed how their reaction was possibly beneficial and how it was possibly unhelpful.  The group discussed a variety of healthier coping skills that could help with such a situation in the future.  Focus was placed on how helpful it is to recognize the underlying emotions to our anger, because working on those can lead to a more permanent solution as well as our ability to focus on the important rather than the urgent.  Therapeutic Goals: Patients will remember their last incident of anger and how they felt emotionally and physically, what their thoughts were at the time, and how they behaved. Patients will identify how their behavior at that time worked for them, as well as how it worked against them. Patients will explore possible new behaviors to use in future anger situations. Patients will learn that anger itself is normal and cannot be eliminated, and that healthier reactions can assist with resolving conflict rather than worsening situations.  Summary of Patient Progress:  Patient was present for the entirety of group session. Patient participated in opening and closing remarks. However, patient did not contribute at all to the topic of discussion despite encouraged participation.   Therapeutic Modalities:   Cognitive Behavioral Therapy    Larose Kells 06/17/2022  3:47 PM

## 2022-06-17 NOTE — [facility] Suicide Risk Assessment (Signed)
[facility] INPATIENT:  Family/Significant Other Suicide Prevention Education  Suicide Prevention Education:  Education Completed; Duwayne Heck, mother has been identified by the patient as the family member/significant other with whom the patient will be residing, and identified as the person(s) who will aid the patient in the event of a mental health crisis (suicidal ideations/suicide attempt).  With written consent from the patient, the family member/significant other has been provided the following suicide prevention education, prior to the and/or following the discharge of the patient.  States she has been using substances since college, has little belief that the patient can abstain from drug use. Further states she does not believe the patient would not benefit from inpatient treatment and rather needs to "get a job and stop associating with drug abusing looser."    The suicide prevention education provided includes the following: Suicide risk factors Suicide prevention and interventions National Suicide Hotline telephone number [facility] assessment telephone number [facility] Emergency Assistance [place] and/or Residential Mobile Crisis Unit telephone number  Request made of family/significant other to: Remove weapons (e.g., guns, rifles, knives), all items previously/currently identified as safety concern.   Remove drugs/medications (over-the-counter, prescriptions, illicit drugs), all items previously/currently identified as a safety concern.  The family member/significant other verbalizes understanding of the suicide prevention education information provided.  The family member/significant other agrees to remove the items of safety concern listed above.  Durenda Hurt 06/17/2022, 12:44 PM

## 2022-06-17 NOTE — Progress Notes (Addendum)
D:  Patient's self inventory sheet, patient has fair sleep, no sleep medication.  Good appetite, normal energy level, good concentration.  Rated depression, hopeless and anxiety 6.  Denied withdrawals.  Agitation.  Denied SI.  Denied physical problems.  Denied physical pain.  Goal is groups, plans.  Plans to talk to SW.  Does have discharge plans. A:  Medications administered per MD orders. Emotional support and encouragement given patient. R:  Safety maintained with 15 minute checks.  Denied SI and HI, contracts for safety.  Denied A/V hallucination

## 2022-06-17 NOTE — Progress Notes (Signed)
[facility] MD Progress Note  06/17/2022 12:57 PM Taylor Wiggins  MRN:  [id number] Reason for admission :Taylor Wiggins is a 41 yo patient w/ PPH of polysubstance abuse (including hx of opioid use disorder, cocaine use disorder, and THC use disorder) , substance induced mood disorder, substance induced psychosis,  GAD and ADHD. Patient presented to [facility] via [facility] after endorsing SI when her husband dropped her at a [employer] and told her he did not want to see her again. Patient appeared paranoid at [facility] but did not require antipsychotic. Patient did start Lexapro.    Chart review from last 24 hrs- Case was discussed in the multidisciplinary team. MAR was reviewed and patient was compliant with medications but refused Ensure one time yesterday.  She required PRN  Vistaril x1 for anxiety yesterday. Per staff patient has not been attending groups.  Her most recent CIWA was 2.  Patient slept 6.5 hours last night.    Information obtained today from patient- On assessment this AM, patient reports her mood is still depressed and she feels anxious and low. Patient's affect is depressed.  She reports that her appetite is stable.  She did not sleep well last night and was up and down.  She reports that she sleeps during the daytime.  She denies any withdrawal symptoms today except some hot and cold sensation at night.  She denies any cravings at this time.  Discussed that I talked to [place] who reported that due to patient having "Friday insurance" which is going bankrupt so she may not qualify for substance abuse residential treatment.  She may be able to go to substance abuse shelter rehab places.  She states that the CSW told her that he will try for [facility] treatment center.  She states that she is not interested in shelter rehab places and would rather go to her mom's place.  Currently, patient denies active SI but reports passive SI without plan or intent.  She denies HI, and AVH as well as paranoia or delusions.   Encouraged her to attend groups.  She verbalizes understanding.  She denies any other physical symptoms today.  Principal Problem: Substance induced mood disorder (HCC) Diagnosis: Principal Problem:   Substance induced mood disorder (HCC) Active Problems:   Tobacco abuse   Methamphetamine abuse ([place])   History of opioid abuse ([place])   Cannabis use disorder, moderate, dependence ([place])  Total Time spent with patient: 20 minutes  Past Psychiatric History: See H&P  Past Medical History:  Past Medical History:  Diagnosis Date   Adult ADHD    Anxiety    Asthma    IBS (irritable bowel syndrome)    Opioid use disorder    No longer on buprenorphine therapy   PTSD (post-traumatic stress disorder)     Past Surgical History:  Procedure Laterality Date   BREAST REDUCTION SURGERY  2005   Family History: History reviewed. No pertinent family history. Family Psychiatric  History: See H&P Social History:  Social History   Substance and Sexual Activity  Alcohol Use No     Social History   Substance and Sexual Activity  Drug Use Yes   Types: Benzodiazepines, Marijuana, Methamphetamines   Comment: recently    Social History   Socioeconomic History   Marital status: Single    Spouse name: Not on file   Number of children: Not on file   Years of education: Not on file   Highest education level: Not on file  Occupational History  Not on file  Tobacco Use   Smoking status: Every Day    Packs/day: 1.00    Types: Cigarettes    Last attempt to quit: 09/06/2014    Years since quitting: 7.7   Smokeless tobacco: Never  Substance and Sexual Activity   Alcohol use: No   Drug use: Yes    Types: Benzodiazepines, Marijuana, Methamphetamines    Comment: recently   Sexual activity: Never    Birth control/protection: Abstinence  Other Topics Concern   Not on file  Social History Narrative   41 y/o Caucasian female. Married and homeless with polysubstance abuse. Per pt-Graduated from  [employer] in 2006 with bachelors degree in [place].   Social Determinants of Health   Financial Resource Strain: Not on file  Food Insecurity: Not on file  Transportation Needs: Not on file  Physical Activity: Not on file  Stress: Not on file  Social Connections: Not on file   Additional Social History:                         Sleep: Fair  Appetite:  Good   Current Medications: Current Facility-Administered Medications  Medication Dose Route Frequency Provider Last Rate Last Admin   acetaminophen (TYLENOL) tablet 650 mg  650 mg Oral Q6H PRN Revonda Humphrey, NP   650 mg at 06/12/22 2110   alum & mag hydroxide-simeth (MAALOX/MYLANTA) 200-200-20 MG/5ML suspension 30 mL  30 mL Oral Q4H PRN Revonda Humphrey, NP       feeding supplement (ENSURE ENLIVE / ENSURE PLUS) liquid 237 mL  237 mL Oral BID BM Massengill, Ovid Curd, MD   237 mL at 06/16/22 1528   hydrOXYzine (ATARAX) tablet 25 mg  25 mg Oral TID PRN Revonda Humphrey, NP   25 mg at 06/16/22 1835   lamoTRIgine (LAMICTAL) tablet 25 mg  25 mg Oral Daily Damita Dunnings B, MD   25 mg at 06/17/22 0818   magnesium hydroxide (MILK OF MAGNESIA) suspension 30 mL  30 mL Oral Daily PRN Revonda Humphrey, NP       multivitamin with minerals tablet 1 tablet  1 tablet Oral Daily Damita Dunnings B, MD   1 tablet at 06/17/22 0818   nicotine polacrilex (NICORETTE) gum 2 mg  2 mg Oral PRN Freida Busman, MD       pantoprazole (PROTONIX) EC tablet 40 mg  40 mg Oral Daily Revonda Humphrey, NP   40 mg at 06/17/22 0818   thiamine tablet 100 mg  100 mg Oral Daily Damita Dunnings B, MD   100 mg at 06/17/22 0818   traZODone (DESYREL) tablet 50 mg  50 mg Oral QHS PRN Revonda Humphrey, NP        Lab Results: No results found for this or any previous visit (from the past 78 hour(s)).  Blood Alcohol level:  Lab Results  Component Value Date   ETH <10 06/09/2022   ETH <10 44/12/270    Metabolic Disorder Labs: Lab  Results  Component Value Date   HGBA1C 5.4 06/11/2022   MPG 108.28 06/11/2022   No results found for: "PROLACTIN" Lab Results  Component Value Date   CHOL 168 06/11/2022   TRIG 107 06/11/2022   HDL 60 06/11/2022   CHOLHDL 2.8 06/11/2022   VLDL 21 06/11/2022   LDLCALC 87 06/11/2022    Physical Findings: AIMS: Facial and Oral Movements Muscles of Facial Expression: None, normal Lips and Perioral  Area: None, normal Jaw: None, normal Tongue: None, normal,Extremity Movements Upper (arms, wrists, hands, fingers): None, normal Lower (legs, knees, ankles, toes): None, normal, Trunk Movements Neck, shoulders, hips: None, normal, Overall Severity Severity of abnormal movements (highest score from questions above): None, normal Incapacitation due to abnormal movements: None, normal Patient's awareness of abnormal movements (rate only patient's report): No Awareness, Dental Status Current problems with teeth and/or dentures?: No Does patient usually wear dentures?: No  CIWA:  CIWA-Ar Total: 2 COWS:     Musculoskeletal: Strength & Muscle Tone: within normal limits Gait & Station: normal Patient leans: N/A  Psychiatric Specialty Exam:  Presentation  General Appearance: Casual  Eye Contact:Fair  Speech:Clear and Coherent  Speech Volume:Normal  Handedness:Right   Mood and Affect  Mood:Anxious; Dysphoric  Affect:Congruent; Depressed   Thought Process  Thought Processes:Goal Directed; Coherent  Descriptions of Associations:Intact  Orientation:Full (Time, Place and Person)  Thought Content:Logical  History of Schizophrenia/Schizoaffective disorder:No  Duration of Psychotic Symptoms:Less than six months  Hallucinations:Hallucinations: None Description of Visual Hallucinations: none  Ideas of Reference:None  Suicidal Thoughts:Suicidal Thoughts: Yes, Passive SI Active Intent and/or Plan: -- (denies active thoughts) SI Passive Intent and/or Plan: Without Intent;  Without Plan  Homicidal Thoughts:Homicidal Thoughts: No   Sensorium  Memory:Immediate Fair; Recent Fair  Judgment:Poor (not attending groups)  Insight:Shallow   Executive Functions  Concentration:Fair  Attention Span:Fair  [place]  Language:Good   Psychomotor Activity  Psychomotor Activity:Psychomotor Activity: Normal   Assets  Assets:Desire for Improvement; Resilience; Social Support   Sleep  Sleep:Sleep: Fair Number of Hours of Sleep: 6.5    Physical Exam: Physical Exam HENT:     Head: Normocephalic and atraumatic.  Pulmonary:     Effort: Pulmonary effort is normal.  Neurological:     Mental Status: She is alert and oriented to person, place, and time.    Review of Systems  Constitutional:  Negative for fever.       Hot and cold feeling at night  Respiratory:  Negative for cough and shortness of breath.   Cardiovascular:  Negative for chest pain.  Gastrointestinal:  Negative for abdominal pain, constipation, diarrhea, nausea and vomiting.  Neurological:  Negative for dizziness and headaches.  Psychiatric/Behavioral:  Positive for depression and suicidal ideas. Negative for hallucinations. The patient is nervous/anxious and has insomnia.    Blood pressure 96/67, pulse 93, temperature 98.1 F (36.7 C), temperature source Oral, resp. rate 18, height '5\' 7"'$  (1.702 m), weight 59.9 kg, last menstrual period 05/28/2022, SpO2 100 %. Body mass index is 20.67 kg/m.   Treatment Plan Summary: Daily contact with patient to assess and evaluate symptoms and progress in treatment and Medication management Patient is still feeling depressed and anxious.  Librium taper will finish today.  She has not been attending groups and sleeping most of the day.  Encouraged her to attend and participate in all groups.  CSW reported that patient may not qualify for regular substance abuse inpatient rehab due to insurance problem.  Patient is not  interested in substance abuse shelter places and would rather go to mom's place.  Will add Strattera to augment mood, energy and concentration. Labs Reviewed: TSH- WNL, Lipid- WNL, A1c- WNL, CMP: ALT 49, CBC- WNL, UA: rare bacteria and UDS-  +TCH and meth   EKG: Qtc- 436 NSR, HR 76   Substance induced mood disorder( R/O substance induced psychosis) Stimulant use disorder, severe methamphetamines Hx of opioid use disorder H/o ADHD -- Continue Lamictal  $'25mg'u$ , for impuslivity and mood instability - Start Strattera 40 mg daily from tomorrow morning to augment mood, energy and concentration.  Etoh use disorder, severe - Continue CIWA w/ Librium taper finished today  -- Last CIWA: 2  --CSW to help with inpatient rehab placement. CSW reported that patient may not qualify for regular substance abuse inpatient rehab due to insurance problem.  Patient is not interested in substance abuse shelter places and would rather go to mom's place.    Tobacco use disorder, moderate - Nicorette gum     PGY-2 Armando Reichert, MD 06/17/2022, 12:57 PM

## 2022-06-17 NOTE — Progress Notes (Signed)
   06/17/22 2152  Psych Admission Type (Psych Patients Only)  Admission Status Voluntary  Psychosocial Assessment  Patient Complaints Anxiety  Facial Expression Anxious  Affect Appropriate to circumstance  Speech Logical/coherent  Interaction Guarded  Motor Activity Slow  Appearance/Hygiene Unremarkable  Behavior Characteristics Cooperative  Mood Pleasant  Thought Process  Coherency WDL  Content WDL  Delusions None reported or observed  Perception WDL  Hallucination None reported or observed  Judgment Impaired  Confusion None  Danger to Self  Current suicidal ideation? Denies   D: Patient in dayroom reports she had a good day. Pt stated she is tolerating medication well. A: Support and encouragement provided as needed.  R: Patient remains safe on the unit. Will continue to monitor for safety and stability.

## 2022-06-17 NOTE — Progress Notes (Signed)
Chaplain received a consult that Maya was requesting prayer.  Chaplain provided spiritual support and prayer and asked Murdis if she wanted to talk further.  She stated that talking would bring on tears that she was afraid she wouldn't be able to stop.  If she decides she wants to talk further, please consult [place] again and chaplain will return.   [address] [place] Pager, [phone]

## 2022-06-17 NOTE — Progress Notes (Signed)
Psychoeducational Group Note  Date:  06/17/2022 Time:  2128  Group Topic/Focus:  Wrap-Up Group:   The focus of this group is to help patients review their daily goal of treatment and discuss progress on daily workbooks.  Participation Level: Did Not Attend  Participation Quality:  Not Applicable  Affect:  Not Applicable  Cognitive:  Not Applicable  Insight:  Not Applicable  Engagement in Group: Not Applicable  Additional Comments:  The patient did not attend the evening A.A.meeting.   Archie Balboa S 06/17/2022, 9:28 PM

## 2022-06-17 NOTE — Plan of Care (Signed)
Nurse discussed anxiety, depression and coping skills with patient.  

## 2022-06-17 NOTE — [facility] Group Notes (Signed)
Spiritual care group on grief and loss facilitated by chaplain Katy Kemani Demarais, [facility]   Group Goal:   Support / Education around grief and loss   Members engage in facilitated group support and psycho-social education.   Group Description:   Following introductions and group rules, group members engaged in facilitated group dialog and support around topic of loss, with particular support around experiences of loss in their lives. Group Identified types of loss (relationships / self / things) and identified patterns, circumstances, and changes that precipitate losses. Reflected on thoughts / feelings around loss, normalized grief responses, and recognized variety in grief experience. Group noted Worden's four tasks of grief in discussion.   Group drew on Adlerian / Rogerian, narrative, MI,   Patient Progress: Did not attend.  

## 2022-06-18 NOTE — Progress Notes (Signed)
   06/18/22 2144  Psych Admission Type (Psych Patients Only)  Admission Status Voluntary  Psychosocial Assessment  Patient Complaints None  Eye Contact Fair  Facial Expression Anxious  Affect Appropriate to circumstance  Speech Logical/coherent  Interaction Assertive  Motor Activity Other (Comment) (WDL)  Appearance/Hygiene Unremarkable  Behavior Characteristics Cooperative  Mood Depressed  Thought Process  Coherency WDL  Content WDL  Delusions None reported or observed  Perception WDL  Hallucination None reported or observed  Judgment Impaired  Confusion None  Danger to Self  Current suicidal ideation? Denies  Self-Injurious Behavior No self-injurious ideation or behavior indicators observed or expressed   Agreement Not to Harm Self No  Danger to Others  Danger to Others None reported or observed   D: Patient reports she had a good day and tolerating medication well. A: Support and encouragement provided as needed.  R: Patient remains safe on the unit. Will continue to monitor for safety and stability.

## 2022-06-18 NOTE — Progress Notes (Signed)
Psychoeducational Group Note  Date:  06/18/2022 Time:  2015  Group Topic/Focus:  Wrap up group  Participation Level: Did Not Attend  Participation Quality:  Not Applicable  Affect:  Not Applicable  Cognitive:  Not Applicable  Insight:  Not Applicable  Engagement in Group: Not Applicable  Additional Comments:  Did not attend.   Winfield Rast S 06/18/2022, 9:08 PM
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
## 2022-06-18 NOTE — Group Note (Signed)
Recreation Therapy Group Note   Group Topic:Animal Assisted Therapy   Group Date: 06/18/2022 Start Time: 1430 End Time: 1515 Facilitators: Victorino Sparrow, LRT,CTRS Location: [address] Dayroom   Animal-Assisted Activity (AAA) Program Checklist/Progress Note Patient Eligibility Criteria Checklist & Daily Group note for Rec Tx Intervention   AAA/T Program Assumption of Risk Form signed by Patient/ or Parent Legal Guardian YES  Patient understands their participation is voluntary YES   Affect/Mood: N/A   Participation Level: Did not attend    Clinical Observations/Individualized Feedback:     Plan: Continue to engage patient in RT group sessions 2-3x/week.   Victorino Sparrow, Glennis Brink 06/18/2022 3:44 PM

## 2022-06-18 NOTE — Progress Notes (Signed)
   06/18/22 0930  Psych Admission Type (Psych Patients Only)  Admission Status Voluntary  Psychosocial Assessment  Patient Complaints None  Eye Contact Brief  Facial Expression Sad  Affect Sad  Speech Logical/coherent  Interaction Assertive  Motor Activity Other (Comment) (WDL)  Appearance/Hygiene Unremarkable  Behavior Characteristics Cooperative;Appropriate to situation;Anxious  Mood Depressed  Thought Process  Coherency WDL  Content WDL  Delusions WDL  Perception WDL  Hallucination None reported or observed  Judgment Impaired  Confusion WDL  Danger to Self  Current suicidal ideation? Denies  Danger to Others  Danger to Others None reported or observed

## 2022-06-19 MED ORDER — HYDROXYZINE HCL 25 MG PO TABS: 25.0000 mg | ORAL_TABLET | Freq: Three times a day (TID) | ORAL | 0 refills | Status: AC | PRN

## 2022-06-19 MED ORDER — LAMOTRIGINE 25 MG PO TABS: 25.0000 mg | ORAL_TABLET | Freq: Every day | ORAL | 0 refills | Status: DC

## 2022-06-19 MED ORDER — ATOMOXETINE HCL 40 MG PO CAPS: 40.0000 mg | ORAL_CAPSULE | Freq: Every day | ORAL | 0 refills | Status: DC

## 2022-06-19 MED ORDER — NICOTINE POLACRILEX 2 MG MT GUM: 2.0000 mg | CHEWING_GUM | OROMUCOSAL | 0 refills | Status: AC | PRN

## 2022-06-19 NOTE — Progress Notes (Signed)
D. Pt presents with a sad affect/ anxious mood, but brightens and is pleasant during interactions. Pt has been visible in the milieu, observed attending group this am. Pt currently denies SI/HI and AVH A. Labs and vitals monitored. Pt given and educated on medications. Pt supported emotionally and encouraged to express concerns and ask questions.   R. Pt remains safe with 15 minute checks. Will continue POC.

## 2022-06-19 NOTE — Discharge Summary (Signed)
Physician Discharge Summary Note  Patient:  Taylor Wiggins is an 41 y.o., female MRN:  [id number] DOB:  Oct 30, 1981 Patient phone:  [phone] (home)  Patient address:   [address] [place] [address],  Total Time spent with patient: 30 minutes  Date of Admission:  06/11/2022 Date of Discharge: 06/19/22  Reason for Admission:  Taylor Wiggins is a 73 yo patient w/ PPH of polysubstance abuse (including hx of opioid use disorder, cocaine use disorder, and THC use disorder) , substance induced mood disorder, substance induced psychosis,  GAD and ADHD. Patient presented to [facility] via [facility] after endorsing SI when her husband dropped her at a [employer] and told her he did not want to see her again. Patient appeared paranoid at [facility] but did not require antipsychotic. Patient did start Lexapro  Principal Problem: Substance induced mood disorder ([place]) Discharge Diagnoses: Principal Problem:   Substance induced mood disorder ([place]) Active Problems:   Tobacco abuse   Methamphetamine abuse ([place])   History of opioid abuse ([place])   Cannabis use disorder, moderate, dependence ([place])   Past Psychiatric History: [facility] 06/2013 for Etoh detox and was dc'd on Zoloft. Hx of [facility] approx 2 months ago no meds at discharge.   Previous meds: Suboxone (outpt for opioid use disorder), Klonopin '1mg'$  BID, Adderall '60mg'$  daily   Despite PDMP endorsing that patient is filling all of her prescriptions each month, patient endorses that she has not taken her Adderall in the last few months and her Klonopin in the last few weeks. Patient did briefly mention that her ID was "[name]" and that she last saw it when her husband went to pick up her meds.    Past Medical History:  Past Medical History:  Diagnosis Date   Adult ADHD    Anxiety    Asthma    IBS (irritable bowel syndrome)    Opioid use disorder    No longer on buprenorphine therapy   PTSD (post-traumatic stress disorder)     Past Surgical History:   Procedure Laterality Date   BREAST REDUCTION SURGERY  2005   Family History: History reviewed. No pertinent family history. Family Psychiatric  History: Mom: Etoh use disorder Dad: Etoh use disorder  Reports that many people in her family suffer from depression and anxiety    NO hx of SA or suicides.    Social History:  Social History   Substance and Sexual Activity  Alcohol Use No     Social History   Substance and Sexual Activity  Drug Use Yes   Types: Benzodiazepines, Marijuana, Methamphetamines   Comment: recently    Social History   Socioeconomic History   Marital status: Single    Spouse name: Not on file   Number of children: Not on file   Years of education: Not on file   Highest education level: Not on file  Occupational History   Not on file  Tobacco Use   Smoking status: Every Day    Packs/day: 1.00    Types: Cigarettes    Last attempt to quit: 09/06/2014    Years since quitting: 7.7   Smokeless tobacco: Never  Substance and Sexual Activity   Alcohol use: No   Drug use: Yes    Types: Benzodiazepines, Marijuana, Methamphetamines    Comment: recently   Sexual activity: Never    Birth control/protection: Abstinence  Other Topics Concern   Not on file  Social History Narrative   41 y/o Caucasian female. Married and homeless  with polysubstance abuse. Per pt-Graduated from [employer] in 2006 with bachelors degree in [place].   Social Determinants of Health   Financial Resource Strain: Not on file  Food Insecurity: Not on file  Transportation Needs: Not on file  Physical Activity: Not on file  Stress: Not on file  Social Connections: Not on file    Hospital Course:   HOSPITAL COURSE:   During the patient's hospitalization, patient had extensive initial psychiatric evaluation, and follow-up psychiatric evaluations every day.   Psychiatric diagnoses provided upon initial assessment:  Substance induced mood disorder( R/O  substance induced psychosis) Stimulant use disorder, severe methamphetamines Hx of opioid use disorder Etoh use disorder, severe  Tobacco use disorder, moderate   Patient's psychiatric medications were adjusted on admission:  -- Continued Lexapro '5mg'$ , for depressed mood - CIWA w/ Librium PRN - Nicorette gum   During the hospitalization, other adjustments were made to the patient's psychiatric medication regimen:  Stopped Lexapro (ineffective) Started Lamictal '25mg'$ , for impuslivity and mood instability Started Strattera 40 mg daily to augment mood, energy and concentration. Completed Librium taper  Patient's care was discussed during the interdisciplinary team meeting every day during the hospitalization.   The patient denies having side effects to prescribed psychiatric medication.   Gradually, patient started adjusting to milieu. The patient was evaluated each day by a clinical provider to ascertain response to treatment. Improvement was noted by the patient's report of decreasing symptoms, improved sleep and appetite, affect, medication tolerance, behavior, and participation in unit programming.  Patient was asked each day to complete a self inventory noting mood, mental status, pain, new symptoms, anxiety and concerns.     Symptoms were reported as significantly decreased or resolved completely by discharge.    On day of discharge, the patient reports that their mood is stable. The patient denied having suicidal thoughts for more than 48 hours prior to discharge.  Patient denies having homicidal thoughts.  Patient denies having auditory hallucinations.  Patient denies any visual hallucinations or other symptoms of psychosis. The patient was motivated to continue taking medication with a goal of continued improvement in mental health.    The patient reports their target psychiatric symptoms of  substance induced mood disorder responded well to the psychiatric medications, and the patient  reports overall benefit other psychiatric hospitalization. Supportive psychotherapy was provided to the patient. The patient also participated in regular group therapy while hospitalized. Coping skills, problem solving as well as relaxation therapies were also part of the unit programming.   Labs were reviewed with the patient, and abnormal results were discussed with the patient.   The patient is able to verbalize their individual safety plan to this provider.   # It is recommended to the patient to continue psychiatric medications as prescribed, after discharge from the hospital.     # It is recommended to the patient to follow up with your outpatient psychiatric provider and PCP.   # It was discussed with the patient, the impact of alcohol, drugs, tobacco have been there overall psychiatric and medical wellbeing, and total abstinence from substance use was recommended the patient.ed.   # Prescriptions provided or sent directly to preferred pharmacy at discharge. Patient agreeable to plan. Given opportunity to ask questions. Appears to feel comfortable with discharge.    # Discussed and side effects of Lamictal including rash and steven johnson syndrome. Recommended that she should report any side effects to her outpatient provider.     # In the  event of worsening symptoms, the patient is instructed to call the crisis hotline, 911 and or go to the nearest ED for appropriate evaluation and treatment of symptoms. To follow-up with primary care provider for other medical issues, concerns and or health care needs   #Patient seen by this MD. At time of discharge, consistently refuted any suicidal ideation, intention or plan, denies any Self harm urges. Denies any A/VH and no delusions were elicited and does not seem to be responding to internal stimuli. She denies any withdrawal symptoms. During assessment the patient is able to verbalize safety plan to use on return home. Patient verbalizes intent to  be compliant with medication and outpatient services.  She will be given a list of [place] by [place].   # Patient was discharged to [facility] home with a plan to follow up as noted below.  Physical Findings: AIMS: Facial and Oral Movements Muscles of Facial Expression: None, normal Lips and Perioral Area: None, normal Jaw: None, normal Tongue: None, normal,Extremity Movements Upper (arms, wrists, hands, fingers): None, normal Lower (legs, knees, ankles, toes): None, normal, Trunk Movements Neck, shoulders, hips: None, normal, Overall Severity Severity of abnormal movements (highest score from questions above): None, normal Incapacitation due to abnormal movements: None, normal Patient's awareness of abnormal movements (rate only patient's report): No Awareness, Dental Status Current problems with teeth and/or dentures?: No Does patient usually wear dentures?: No  CIWA:  CIWA-Ar Total: 2 COWS:     Musculoskeletal: Strength & Muscle Tone: within normal limits Gait & Station: normal Patient leans: N/A   Psychiatric Specialty Exam:  Presentation  General Appearance: Casual; Fairly Groomed  Eye Contact:Good  Speech:Clear and Coherent; Normal Rate  Speech Volume:Normal  Handedness:Right   Mood and Affect  Mood:Euthymic  Affect:Appropriate; Congruent; Full Range   Thought Process  Thought Processes:Linear  Descriptions of Associations:Intact  Orientation:Full (Time, Place and Person)  Thought Content:Logical  History of Schizophrenia/Schizoaffective disorder:No  Duration of Psychotic Symptoms:Less than six months  Hallucinations:Hallucinations: None Description of Visual Hallucinations: none  Ideas of Reference:None  Suicidal Thoughts:Suicidal Thoughts: No SI Active Intent and/or Plan: -- (denies) SI Passive Intent and/or Plan: -- (denies)  Homicidal Thoughts:Homicidal Thoughts: No   Sensorium  Memory:Immediate Good; Recent Good; Remote  Good  Judgment:Fair  Insight:Fair   Executive Functions  Concentration:Fair  Attention Span:Fair  [place]  Language:Good   Psychomotor Activity  Psychomotor Activity:Psychomotor Activity: Normal   Assets  Assets:Desire for Improvement; Resilience; Social Support   Sleep  Sleep:Sleep: Good Number of Hours of Sleep: 8.75    Physical Exam: Physical Exam Vitals and nursing note reviewed.  Constitutional:      General: She is not in acute distress.    Appearance: Normal appearance. She is not ill-appearing, toxic-appearing or diaphoretic.  HENT:     Head: Normocephalic and atraumatic.  Pulmonary:     Effort: Pulmonary effort is normal.  Neurological:     Mental Status: She is alert and oriented to person, place, and time.    Review of Systems  Constitutional:  Negative for fever.  Respiratory:  Negative for cough and shortness of breath.   Cardiovascular:  Negative for chest pain.  Gastrointestinal:  Negative for abdominal pain, constipation, diarrhea, nausea and vomiting.  Neurological:  Negative for dizziness and headaches.  Psychiatric/Behavioral:  Negative for hallucinations and suicidal ideas. The patient does not have insomnia.    Blood pressure 93/63, pulse (!) 101, temperature 98.1 F (36.7 C), temperature source Oral, resp.  rate 16, height '5\' 7"'$  (1.702 m), weight 59.9 kg, last menstrual period 05/28/2022, SpO2 100 %. Body mass index is 20.67 kg/m.   Social History   Tobacco Use  Smoking Status Every Day   Packs/day: 1.00   Types: Cigarettes   Last attempt to quit: 09/06/2014   Years since quitting: 7.7  Smokeless Tobacco Never   Tobacco Cessation:  A prescription for an FDA-approved tobacco cessation medication provided at discharge   Blood Alcohol level:  Lab Results  Component Value Date   [facility] <10 06/09/2022   ETH <10 02/54/2706    Metabolic Disorder Labs:  Lab Results  Component Value Date   HGBA1C 5.4  06/11/2022   MPG 108.28 06/11/2022   No results found for: "PROLACTIN" Lab Results  Component Value Date   CHOL 168 06/11/2022   TRIG 107 06/11/2022   HDL 60 06/11/2022   CHOLHDL 2.8 06/11/2022   VLDL 21 06/11/2022   LDLCALC 87 06/11/2022    See Psychiatric Specialty Exam and Suicide Risk Assessment completed by Attending Physician prior to discharge.  Discharge destination:  Home  Is patient on multiple antipsychotic therapies at discharge:  No   Has Patient had three or more failed trials of antipsychotic monotherapy by history:  No  Recommended Plan for Multiple Antipsychotic Therapies: NA  Discharge Instructions     Diet - low sodium heart healthy   Complete by: As directed    Increase activity slowly   Complete by: As directed       Allergies as of 06/19/2022       Reactions   Tramadol Other (See Comments)   Seizures   Mold Extract [trichophyton] Other (See Comments)   Sneezing        Medication List     STOP taking these medications    clonazePAM 1 MG tablet Commonly known as: KLONOPIN   escitalopram 5 MG tablet Commonly known as: LEXAPRO       TAKE these medications      Indication  atomoxetine 40 MG capsule Commonly known as: STRATTERA Take 1 capsule (40 mg total) by mouth daily. Start taking on: June 20, 2022  Indication: Attention Deficit Hyperactivity Disorder   hydrOXYzine 25 MG tablet Commonly known as: ATARAX Take 1 tablet (25 mg total) by mouth 3 (three) times daily as needed for anxiety.  Indication: Feeling Anxious   lamoTRIgine 25 MG tablet Commonly known as: LAMICTAL Take 1 tablet (25 mg total) by mouth daily. Start taking on: June 20, 2022  Indication: Manic-Depression   nicotine polacrilex 2 MG gum Commonly known as: NICORETTE Take 1 each (2 mg total) by mouth as needed for smoking cessation.  Indication: Nicotine Addiction   omeprazole 20 MG capsule Commonly known as: PRILOSEC Take 1 capsule (20 mg total) by  mouth daily. What changed:  when to take this reasons to take this  Indication: Gastroesophageal Reflux Disease        [place]. Call on 06/19/2022.   Specialty: Behavioral Health Why: Please present for your intake appointment on 06/19/2022 at 1400 for chemical dependency intensive out patient. Contact information: [place] [address] [phone]         [facility] Follow up.   Why: Referral made Contact information: [place] [address] [phone]         [facility] Follow up.  Specialty: Addiction Medicine Why: Referral made Contact information: [address] [place] [address] [phone]                 Follow-up recommendations:   Activity: as tolerated   Diet: heart healthy   Other: -Follow-up with your outpatient psychiatric provider -instructions on appointment date, time, and address (location) are provided to you in discharge paperwork.   -Take your psychiatric medications as prescribed at discharge - instructions are provided to you in the discharge paperwork   -Follow-up with outpatient primary care doctor and other specialists -for management of chronic medical disease, including: Elevated ALT   -Testing: Follow-up with outpatient provider for abnormal lab results: Urinalysis - few bacteria, ALT 49   -Recommend abstinence from alcohol, tobacco, and other illicit drug use at discharge.    -If your psychiatric symptoms recur, worsen, or if you have side effects to your psychiatric medications, call your outpatient psychiatric provider, 911, [phone] or go to the nearest emergency department.   -If suicidal thoughts recur, call your outpatient psychiatric provider, 911, 988 or go to the nearest emergency department.  Signed: Armando Reichert, MD 06/19/2022, 8:15  PM

## 2022-06-19 NOTE — Discharge Instructions (Signed)
-  Follow-up with your outpatient psychiatric provider -instructions on appointment date, time, and address (location) are provided to you in discharge paperwork.  -Take your psychiatric medications as prescribed at discharge - instructions are provided to you in the discharge paperwork  -Follow-up with outpatient primary care doctor and other specialists -for management of chronic medical disease.  -Recommend abstinence from alcohol, tobacco, and other illicit drug use at discharge.   -If your psychiatric symptoms recur, worsen, or if you have side effects to your psychiatric medications, call your outpatient psychiatric provider, 911, [phone] or go to the nearest emergency department.  -If suicidal thoughts recur, call your outpatient psychiatric provider, 911, [phone] or go to the nearest emergency department.
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
## 2022-06-19 NOTE — Progress Notes (Signed)
Pt discharged to lobby with taxi voucher. Pt was stable and appreciative at that time. All papers and electronic prescriptions were given and valuables returned. Verbal understanding expressed. Denies SI/HI and A/VH. Pt given opportunity to express concerns and ask questions.

## 2022-06-19 NOTE — Progress Notes (Signed)
  [facility] Adult Case Management Discharge Plan :  Will you be returning to the same living situation after discharge:  No. Patient to discharge to mother's residence.  At discharge, do you have transportation home?: Yes,  CSW to assist by providing cab voucher.  Do you have the ability to pay for your medications: Yes,  [facility]  Release of information consent forms completed and in the chart;  Patient's signature needed at discharge.  Patient to Follow up at:  [facility]. Call on 06/19/2022.   Specialty: Behavioral Health Why: Please present for your intake appointment on 06/19/2022 at 1400 for chemical dependency intensive out patient. Contact information: [place] [address] [phone]         [facility] Follow up.   Why: Referral made Contact information: [place] [address] [phone]         [facility] Follow up.   Specialty: Addiction Medicine Why: Referral made Contact information: [address] [place] [address] [phone]                 Next level of care provider has access to [facility] and Suicide Prevention discussed: Yes,  SPE completed with Duwayne Heck, mother.    Has patient been referred to the Quitline?: Patient refused referral Tobacco Use: High Risk (06/11/2022)   Patient History    Smoking Tobacco Use: Every Day    Smokeless Tobacco Use: Never    Passive Exposure: Not on file    Patient has been referred for addiction treatment: Yes Patient referred to [facility]. See appointment information above.  Alcohol Level    Component Value Date/Time   ETH <10 06/09/2022 0501   Social History   Substance and Sexual Activity  Alcohol Use No   Social History   Substance and Sexual  Activity  Drug Use Yes   Types: Benzodiazepines, Marijuana, Methamphetamines   Comment: recently    Durenda Hurt, [place] 06/19/2022, 10:07 AM

## 2022-06-22 ENCOUNTER — Other Ambulatory Visit: Payer: Self-pay

## 2022-06-22 ENCOUNTER — Encounter: Payer: Self-pay | Admitting: Emergency Medicine

## 2022-06-22 ENCOUNTER — Emergency Department
Admission: EM | Admit: 2022-06-22 | Discharge: 2022-06-23 | Disposition: A | Discharge status: Home / Self Care | Payer: 59 | Attending: Emergency Medicine | Admitting: Emergency Medicine

## 2022-06-22 DIAGNOSIS — F15188 Other stimulant abuse with other stimulant-induced disorder: Secondary | ICD-10-CM | POA: Insufficient documentation

## 2022-06-22 DIAGNOSIS — R45851 Suicidal ideations: Secondary | ICD-10-CM | POA: Diagnosis not present

## 2022-06-22 DIAGNOSIS — F1510 Other stimulant abuse, uncomplicated: Secondary | ICD-10-CM | POA: Diagnosis present

## 2022-06-22 DIAGNOSIS — Z5900 Homelessness unspecified: Secondary | ICD-10-CM | POA: Insufficient documentation

## 2022-06-22 DIAGNOSIS — F419 Anxiety disorder, unspecified: Secondary | ICD-10-CM | POA: Diagnosis not present

## 2022-06-22 DIAGNOSIS — F17210 Nicotine dependence, cigarettes, uncomplicated: Secondary | ICD-10-CM | POA: Diagnosis not present

## 2022-06-22 DIAGNOSIS — J45909 Unspecified asthma, uncomplicated: Secondary | ICD-10-CM | POA: Diagnosis not present

## 2022-06-22 DIAGNOSIS — N3000 Acute cystitis without hematuria: Secondary | ICD-10-CM

## 2022-06-22 DIAGNOSIS — F411 Generalized anxiety disorder: Secondary | ICD-10-CM | POA: Diagnosis not present

## 2022-06-22 NOTE — ED Notes (Addendum)
Pt belongings:  1 paper grocery back full of personal items 1 black purse with personal items  1 pair of sandals 1 hat 1 pair black sweatpants

## 2022-06-23 DIAGNOSIS — F1510 Other stimulant abuse, uncomplicated: Secondary | ICD-10-CM | POA: Diagnosis not present

## 2022-06-23 DIAGNOSIS — Z5900 Homelessness unspecified: Secondary | ICD-10-CM | POA: Diagnosis not present

## 2022-06-23 DIAGNOSIS — F411 Generalized anxiety disorder: Secondary | ICD-10-CM

## 2022-06-23 DIAGNOSIS — R45851 Suicidal ideations: Secondary | ICD-10-CM | POA: Diagnosis not present

## 2022-06-23 LAB — URINALYSIS, ROUTINE W REFLEX MICROSCOPIC
Bilirubin Urine: NEGATIVE
Glucose, UA: NEGATIVE mg/dL
Ketones, ur: NEGATIVE mg/dL
Nitrite: POSITIVE — AB
Protein, ur: 30 mg/dL — AB
RBC / HPF: >50 RBC/hpf — ABNORMAL HIGH (ref 0–5)
Specific Gravity, Urine: 1.013 (ref 1.005–1.030)
pH: 6 (ref 5.0–8.0)

## 2022-06-23 LAB — URINE DRUG SCREEN, QUALITATIVE (ARMC ONLY)
Amphetamines, Ur Screen: POSITIVE — AB
Barbiturates, Ur Screen: NOT DETECTED
Benzodiazepine, Ur Scrn: POSITIVE — AB
Cannabinoid 50 Ng, Ur ~~LOC~~: NOT DETECTED
Cocaine Metabolite,Ur ~~LOC~~: NOT DETECTED
MDMA (Ecstasy)Ur Screen: NOT DETECTED
Methadone Scn, Ur: NOT DETECTED
Opiate, Ur Screen: NOT DETECTED
Phencyclidine (PCP) Ur S: NOT DETECTED
Tricyclic, Ur Screen: NOT DETECTED

## 2022-06-23 LAB — COMPREHENSIVE METABOLIC PANEL
ALT: 164 U/L — ABNORMAL HIGH (ref 0–44)
AST: 79 U/L — ABNORMAL HIGH (ref 15–41)
Albumin: 4.1 g/dL (ref 3.5–5.0)
Alkaline Phosphatase: 44 U/L (ref 38–126)
Anion gap: 5 (ref 5–15)
BUN: 21 mg/dL — ABNORMAL HIGH (ref 6–20)
CO2: 25 mmol/L (ref 22–32)
Calcium: 9.3 mg/dL (ref 8.9–10.3)
Chloride: 107 mmol/L (ref 98–111)
Creatinine, Ser: 0.93 mg/dL (ref 0.44–1.00)
GFR, Estimated: >60 mL/min (ref >60)
Glucose, Bld: 112 mg/dL — ABNORMAL HIGH (ref 70–99)
Potassium: 4.1 mmol/L (ref 3.5–5.1)
Sodium: 137 mmol/L (ref 135–145)
Total Bilirubin: 0.7 mg/dL (ref 0.3–1.2)
Total Protein: 7.7 g/dL (ref 6.5–8.1)

## 2022-06-23 LAB — CBC
HCT: 39.3 % (ref 36.0–46.0)
Hemoglobin: 12.7 g/dL (ref 12.0–15.0)
MCH: 30.5 pg (ref 26.0–34.0)
MCHC: 32.3 g/dL (ref 30.0–36.0)
MCV: 94.2 fL (ref 80.0–100.0)
Platelets: 371 10*3/uL (ref 150–400)
RBC: 4.17 MIL/uL (ref 3.87–5.11)
RDW: 13.3 % (ref 11.5–15.5)
WBC: 6.1 10*3/uL (ref 4.0–10.5)
nRBC: 0 % (ref 0.0–0.2)

## 2022-06-23 LAB — SALICYLATE LEVEL: Salicylate Lvl: <7 mg/dL — ABNORMAL LOW (ref 7.0–30.0)

## 2022-06-23 LAB — ETHANOL: Alcohol, Ethyl (B): <10 mg/dL (ref <10)

## 2022-06-23 LAB — ACETAMINOPHEN LEVEL: Acetaminophen (Tylenol), Serum: <10 ug/mL — ABNORMAL LOW (ref 10–30)

## 2022-06-23 MED ORDER — CEPHALEXIN 500 MG PO CAPS: 500.0000 mg | ORAL_CAPSULE | Freq: Four times a day (QID) | ORAL | 0 refills | Status: AC
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
## 2022-06-23 NOTE — ED Notes (Signed)
ED provider in with patient. 

## 2022-06-23 NOTE — ED Notes (Signed)
TTS ans psych NP in to talk with patient.

## 2022-07-05 ENCOUNTER — Telehealth (HOSPITAL_COMMUNITY): Payer: Self-pay | Admitting: General Practice

## 2022-07-05 NOTE — BH Assessment (Signed)
Care Management - Follow Up [facility] Discharges   Patient has been placed in an inpatient psychiatric hospital ([facility]) on 06-11-2022

## 2022-07-06 ENCOUNTER — Ambulatory Visit (HOSPITAL_COMMUNITY)
Admission: EM | Admit: 2022-07-06 | Discharge: 2022-07-07 | Disposition: A | Discharge status: Home / Self Care | Payer: 59 | Attending: Behavioral Health | Admitting: Behavioral Health

## 2022-07-06 DIAGNOSIS — F22 Delusional disorders: Secondary | ICD-10-CM | POA: Insufficient documentation

## 2022-07-06 DIAGNOSIS — F19159 Other psychoactive substance abuse with psychoactive substance-induced psychotic disorder, unspecified: Secondary | ICD-10-CM | POA: Diagnosis not present

## 2022-07-06 DIAGNOSIS — Z20822 Contact with and (suspected) exposure to covid-19: Secondary | ICD-10-CM | POA: Insufficient documentation

## 2022-07-06 DIAGNOSIS — F19950 Other psychoactive substance use, unspecified with psychoactive substance-induced psychotic disorder with delusions: Secondary | ICD-10-CM | POA: Diagnosis not present

## 2022-07-06 DIAGNOSIS — R45851 Suicidal ideations: Secondary | ICD-10-CM | POA: Diagnosis present

## 2022-07-06 LAB — COMPREHENSIVE METABOLIC PANEL
ALT: 32 U/L (ref 0–44)
AST: 20 U/L (ref 15–41)
Albumin: 4.3 g/dL (ref 3.5–5.0)
Alkaline Phosphatase: 49 U/L (ref 38–126)
Anion gap: 10 (ref 5–15)
BUN: 8 mg/dL (ref 6–20)
CO2: 26 mmol/L (ref 22–32)
Calcium: 10.5 mg/dL — ABNORMAL HIGH (ref 8.9–10.3)
Chloride: 104 mmol/L (ref 98–111)
Creatinine, Ser: 0.77 mg/dL (ref 0.44–1.00)
GFR, Estimated: >60 mL/min (ref >60)
Glucose, Bld: 90 mg/dL (ref 70–99)
Potassium: 5.5 mmol/L — ABNORMAL HIGH (ref 3.5–5.1)
Sodium: 140 mmol/L (ref 135–145)
Total Bilirubin: 0.5 mg/dL (ref 0.3–1.2)
Total Protein: 7.5 g/dL (ref 6.5–8.1)

## 2022-07-06 LAB — LIPID PANEL
Cholesterol: 200 mg/dL (ref 0–200)
HDL: 71 mg/dL (ref >40)
LDL Cholesterol: 119 mg/dL — ABNORMAL HIGH (ref 0–99)
Total CHOL/HDL Ratio: 2.8 RATIO
Triglycerides: 50 mg/dL (ref <150)
VLDL: 10 mg/dL (ref 0–40)

## 2022-07-06 LAB — POC SARS CORONAVIRUS 2 AG -  ED: SARS Coronavirus 2 Ag: NEGATIVE

## 2022-07-06 LAB — CBC WITH DIFFERENTIAL/PLATELET
Abs Immature Granulocytes: 0.02 10*3/uL (ref 0.00–0.07)
Basophils Absolute: 0 10*3/uL (ref 0.0–0.1)
Basophils Relative: 1 %
Eosinophils Absolute: 0 10*3/uL (ref 0.0–0.5)
Eosinophils Relative: 0 %
HCT: 41.6 % (ref 36.0–46.0)
Hemoglobin: 14.1 g/dL (ref 12.0–15.0)
Immature Granulocytes: 0 %
Lymphocytes Relative: 21 %
Lymphs Abs: 1.6 10*3/uL (ref 0.7–4.0)
MCH: 31.3 pg (ref 26.0–34.0)
MCHC: 33.9 g/dL (ref 30.0–36.0)
MCV: 92.4 fL (ref 80.0–100.0)
Monocytes Absolute: 0.6 10*3/uL (ref 0.1–1.0)
Monocytes Relative: 8 %
Neutro Abs: 5.1 10*3/uL (ref 1.7–7.7)
Neutrophils Relative %: 70 %
Platelets: 434 10*3/uL — ABNORMAL HIGH (ref 150–400)
RBC: 4.5 MIL/uL (ref 3.87–5.11)
RDW: 13.2 % (ref 11.5–15.5)
WBC: 7.3 10*3/uL (ref 4.0–10.5)
nRBC: 0 % (ref 0.0–0.2)

## 2022-07-06 LAB — RESP PANEL BY RT-PCR (FLU A&B, COVID) ARPGX2
Influenza A by PCR: NEGATIVE
Influenza B by PCR: NEGATIVE
SARS Coronavirus 2 by RT PCR: NEGATIVE

## 2022-07-06 LAB — POCT PREGNANCY, URINE: Preg Test, Ur: NEGATIVE

## 2022-07-06 LAB — ETHANOL: Alcohol, Ethyl (B): <10 mg/dL (ref <10)

## 2022-07-06 LAB — TSH: TSH: 0.685 u[IU]/mL (ref 0.350–4.500)

## 2022-07-06 LAB — POC SARS CORONAVIRUS 2 AG: SARSCOV2ONAVIRUS 2 AG: NEGATIVE

## 2022-07-06 MED ORDER — MAGNESIUM HYDROXIDE 400 MG/5ML PO SUSP: 30.0000 mL | Freq: Every day | ORAL | Status: DC | PRN

## 2022-07-06 MED ORDER — TRAZODONE HCL 50 MG PO TABS: 50.0000 mg | ORAL_TABLET | Freq: Every evening | ORAL | Status: DC | PRN

## 2022-07-06 MED ORDER — HYDROXYZINE HCL 25 MG PO TABS: 25.0000 mg | ORAL_TABLET | Freq: Three times a day (TID) | ORAL | Status: DC | PRN

## 2022-07-06 MED ORDER — LAMOTRIGINE 25 MG PO TABS
25.0000 mg | ORAL_TABLET | Freq: Every day | ORAL | Status: DC
  Administered 2022-07-06 – 2022-07-07 (×2): 25 mg via ORAL
  Filled 2022-07-06 (×2): qty 1

## 2022-07-06 MED ORDER — CLONAZEPAM 1 MG PO TABS
1.0000 mg | ORAL_TABLET | Freq: Two times a day (BID) | ORAL | Status: DC | PRN
  Administered 2022-07-06: 1 mg via ORAL
  Filled 2022-07-06: qty 1

## 2022-07-06 MED ORDER — ACETAMINOPHEN 325 MG PO TABS: 650.0000 mg | ORAL_TABLET | Freq: Four times a day (QID) | ORAL | Status: DC | PRN

## 2022-07-06 MED ORDER — ALUM & MAG HYDROXIDE-SIMETH 200-200-20 MG/5ML PO SUSP: 30.0000 mL | ORAL | Status: DC | PRN

## 2022-07-06 NOTE — ED Notes (Signed)
Patient resting quietly in bed watching tv. Respirations equal and unlabored, skin warm and dry, NAD. Routine safety checks conducted according to facility protocol. Will continue to monitor for safety.

## 2022-07-06 NOTE — ED Provider Notes (Addendum)
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   Alkaline Phosphatase 06/09/2022 48  38 - 126 U/L Final   Total Bilirubin 06/09/2022 0.4  0.3 - 1.2 mg/dL Final    GFR, Estimated 06/09/2022 >60  >60 mL/min Final   Comment: (NOTE) Calculated using the CKD-EPI Creatinine Equation (2021)    Anion gap 06/09/2022 7  5 - 15 Final   Performed at [facility], [address] [place], [place] [address]   WBC 06/09/2022 8.3  4.0 - 10.5 K/uL Final   RBC 06/09/2022 4.27  3.87 - 5.11 MIL/uL Final   Hemoglobin 06/09/2022 13.4  12.0 - 15.0 g/dL Final   HCT 06/09/2022 39.6  36.0 - 46.0 % Final   MCV 06/09/2022 92.7  80.0 - 100.0 fL Final   MCH 06/09/2022 31.4  26.0 - 34.0 pg Final   MCHC 06/09/2022 33.8  30.0 - 36.0 g/dL Final   RDW 06/09/2022 12.9  11.5 - 15.5 % Final   Platelets 06/09/2022 388  150 - 400 K/uL Final   nRBC 06/09/2022 0.0  0.0 - 0.2 % Final   Neutrophils Relative % 06/09/2022 71  % Final   Neutro Abs 06/09/2022 6.0  1.7 - 7.7 K/uL Final   Lymphocytes Relative 06/09/2022 21  % Final   Lymphs Abs 06/09/2022 1.7  0.7 - 4.0 K/uL Final   Monocytes Relative 06/09/2022 7  % Final   Monocytes Absolute 06/09/2022 0.6  0.1 - 1.0 K/uL Final   Eosinophils Relative 06/09/2022 0  % Final   Eosinophils Absolute 06/09/2022 0.0  0.0 - 0.5 K/uL Final   Basophils Relative 06/09/2022 1  % Final   Basophils Absolute 06/09/2022 0.1  0.0 - 0.1 K/uL Final   Immature Granulocytes 06/09/2022 0  % Final   Abs Immature Granulocytes 06/09/2022 0.01  0.00 - 0.07 K/uL Final   Performed at [facility], [address] [place], [place] [address]   Preg, Serum 06/09/2022 NEGATIVE  NEGATIVE Final   Comment:        THE SENSITIVITY OF THIS METHODOLOGY IS >10 mIU/mL. Performed at [facility], [address] [place], [place] [address]    Alcohol, Ethyl (B) 06/09/2022 <10  <10 mg/dL Final   Comment:        LOWEST DETECTABLE LIMIT FOR SERUM ALCOHOL IS 10 mg/dL FOR MEDICAL PURPOSES ONLY Performed at [facility], [address] [place], [place] [address]   Admission on 05/03/2022, Discharged on 05/03/2022  Component Date  Value Ref Range Status   WBC 05/03/2022 6.6  4.0 - 10.5 K/uL Final   RBC 05/03/2022 4.29  3.87 - 5.11 MIL/uL Final   Hemoglobin 05/03/2022 13.7  12.0 - 15.0 g/dL Final   HCT 05/03/2022 40.2  36.0 - 46.0 % Final   MCV 05/03/2022 93.7  80.0 - 100.0 fL Final   MCH 05/03/2022 31.9  26.0 - 34.0 pg Final   MCHC 05/03/2022 34.1  30.0 - 36.0 g/dL Final   RDW 05/03/2022 13.2  11.5 - 15.5 % Final   Platelets 05/03/2022 383  150 - 400 K/uL Final   nRBC 05/03/2022 0.0  0.0 - 0.2 % Final   Performed at [facility], [address], [place], [place] [address]   Sodium 05/03/2022 138  135 - 145 mmol/L Final  Potassium 05/03/2022 3.9  3.5 - 5.1 mmol/L Final   Chloride 05/03/2022 106  98 - 111 mmol/L Final   CO2 05/03/2022 26  22 - 32 mmol/L Final   Glucose, Bld 05/03/2022 103 (H)  70 - 99 mg/dL Final   Glucose reference range applies only to samples taken after fasting for at least 8 hours.   BUN 05/03/2022 14  6 - 20 mg/dL Final   Creatinine, Ser 05/03/2022 0.76  0.44 - 1.00 mg/dL Final   Calcium 05/03/2022 9.2  8.9 - 10.3 mg/dL Final   Total Protein 05/03/2022 7.4  6.5 - 8.1 g/dL Final   Albumin 05/03/2022 4.1  3.5 - 5.0 g/dL Final   AST 05/03/2022 17  15 - 41 U/L Final   ALT 05/03/2022 19  0 - 44 U/L Final   Alkaline Phosphatase 05/03/2022 43  38 - 126 U/L Final   Total Bilirubin 05/03/2022 0.8  0.3 - 1.2 mg/dL Final   GFR, Estimated 05/03/2022 >60  >60 mL/min Final   Comment: (NOTE) Calculated using the CKD-EPI Creatinine Equation (2021)    Anion gap 05/03/2022 6  5 - 15 Final   Performed at [facility], [address], [place], [place] [address]   Alcohol, Ethyl (B) 05/03/2022 <10  <10 mg/dL Final   Comment: (NOTE) Lowest detectable limit for serum alcohol is 10 mg/dL.  For medical purposes only. Performed at [facility], [address] [place], [place] [address]     Allergies: Tramadol and Mold extract [trichophyton]  PTA Medications: (Not in a hospital admission)   Medical  Decision Making  Patient is voluntary. Patient endorses SI with a plan. Patient admitted to the [facility] continuous assessment unit for overnight observation for mood stabilization and safety.  Lab Orders         Resp Panel by RT-PCR (Flu A&B, Covid) Anterior Nasal Swab         CBC with Differential/Platelet         Comprehensive metabolic panel         Hemoglobin A1c         Ethanol         Lipid panel         TSH         Pregnancy, urine         POCT Urine Drug Screen - (I-Screen)         POC SARS Coronavirus 2 Ag-ED - Nasal Swab    EKG   Medications:  Restart Lamictal 25 mg p.o. daily Restart Vistaril 25 mg p.o. 3 times daily as needed for anxiety Add trazodone 50 mg p.o. nightly as needed for sleep Restart Clonazepam 1 mg po BID prn for severe anxiety    Recommendations  Based on my evaluation the patient does not appear to have an emergency medical condition.  Genine Beckett L, NP 07/06/22  10:04 AM

## 2022-07-06 NOTE — ED Notes (Signed)
Pt's poc/pcr covid has been completed, lab drawn, skin check complete, ekg complete, pt has been brought on the unit, dash is being called to pick up patients lab work.

## 2022-07-06 NOTE — ED Triage Notes (Signed)
Pt Taylor Wiggins presents to [facility] voluntarily escorted by [employer]. Pt reports hx of PTSD, anxiety/depression. Pt reports SI with a plan to cut her wrist. Pt reports having a psychiatrist at [facility] outpatient and was prescribed medication but she has not started taking the medication. Pt reports seizure like episode this morning, pt states she began to shake and blacked out. Pt reports seizures in the past. Pt denies HI,NSSIB, drug or alcohol use, and AVH.

## 2022-07-06 NOTE — ED Notes (Signed)
Pt is currently lying in bed with eyes closed, no distress noted, will continue to monitor patient for safety
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
## 2022-07-06 NOTE — ED Notes (Signed)
Pt is currently sleeping, no distress noted, environmental check complete, will continue to monitor patient for safety. ? ?

## 2022-07-06 NOTE — ED Notes (Signed)
Pt is lying quietly in bed , eyes closed, no distressed noted, will continue to monitor patient for safety.

## 2022-07-07 LAB — HEMOGLOBIN A1C
Hgb A1c MFr Bld: 5.4 % (ref 4.8–5.6)
Mean Plasma Glucose: 108.28 mg/dL

## 2022-07-07 NOTE — ED Notes (Signed)
Patient eating breakfast. °

## 2022-07-07 NOTE — Discharge Instructions (Signed)
Take all medications as prescribed. Keep all follow-up appointments as scheduled.  Do not consume alcohol or use illegal drugs while on prescription medications. Report any adverse effects from your medications to your primary care provider promptly.  In the event of recurrent symptoms or worsening symptoms, call 911, a crisis hotline, or go to the nearest emergency department for evaluation.   

## 2022-07-07 NOTE — ED Notes (Signed)
Patient A&O x 4, ambulatory. Patient discharged in no acute distress. Patient denied SI/HI, A/VH upon discharge. Patient verbalized understanding of all discharge instructions explained by staff, to include follow up appointments, and safety plan. Patient reported mood 10/10.  Pt belongings returned to patient from locker #  11 intact. Patient escorted to lobby via staff for transport to destination. Safety maintained.

## 2022-07-07 NOTE — ED Notes (Signed)
Pt A&O x 4, patient continues to endorse suicidal ideations, with no plan.  Denies HI or AVH.  Pt calm & cooperative, monitoring for safety.

## 2022-07-07 NOTE — ED Notes (Signed)
Patient resting quietly in bed with eyes closed, Respirations equal and unlabored, skin warm and dry, NAD. No change in assessment or acuity. Routine safety checks conducted according to facility protocol. Will continue to monitor for safety.      
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
## 2022-07-11 DIAGNOSIS — T43621A Poisoning by amphetamines, accidental (unintentional), initial encounter: Secondary | ICD-10-CM | POA: Diagnosis present

## 2022-07-11 DIAGNOSIS — Z87891 Personal history of nicotine dependence: Secondary | ICD-10-CM | POA: Diagnosis not present

## 2022-07-11 DIAGNOSIS — F1594 Other stimulant use, unspecified with stimulant-induced mood disorder: Secondary | ICD-10-CM | POA: Diagnosis not present

## 2022-07-11 DIAGNOSIS — J45909 Unspecified asthma, uncomplicated: Secondary | ICD-10-CM | POA: Diagnosis not present

## 2022-07-11 DIAGNOSIS — F329 Major depressive disorder, single episode, unspecified: Secondary | ICD-10-CM | POA: Diagnosis not present

## 2022-07-11 DIAGNOSIS — N3000 Acute cystitis without hematuria: Secondary | ICD-10-CM | POA: Diagnosis not present

## 2022-07-11 DIAGNOSIS — F1994 Other psychoactive substance use, unspecified with psychoactive substance-induced mood disorder: Secondary | ICD-10-CM | POA: Diagnosis not present

## 2022-07-12 ENCOUNTER — Encounter: Payer: Self-pay | Admitting: Emergency Medicine

## 2022-07-12 ENCOUNTER — Emergency Department
Admission: EM | Admit: 2022-07-12 | Discharge: 2022-07-13 | Disposition: A | Discharge status: Home / Self Care | Payer: 59 | Attending: Emergency Medicine | Admitting: Emergency Medicine

## 2022-07-12 DIAGNOSIS — N3000 Acute cystitis without hematuria: Secondary | ICD-10-CM

## 2022-07-12 DIAGNOSIS — F1510 Other stimulant abuse, uncomplicated: Secondary | ICD-10-CM

## 2022-07-12 DIAGNOSIS — F1594 Other stimulant use, unspecified with stimulant-induced mood disorder: Secondary | ICD-10-CM | POA: Diagnosis not present

## 2022-07-12 DIAGNOSIS — F32A Depression, unspecified: Secondary | ICD-10-CM

## 2022-07-12 DIAGNOSIS — N390 Urinary tract infection, site not specified: Secondary | ICD-10-CM

## 2022-07-12 DIAGNOSIS — F1994 Other psychoactive substance use, unspecified with psychoactive substance-induced mood disorder: Secondary | ICD-10-CM

## 2022-07-12 LAB — URINE DRUG SCREEN, QUALITATIVE (ARMC ONLY)
Amphetamines, Ur Screen: POSITIVE — AB
Barbiturates, Ur Screen: NOT DETECTED
Benzodiazepine, Ur Scrn: POSITIVE — AB
Cannabinoid 50 Ng, Ur ~~LOC~~: NOT DETECTED
Cocaine Metabolite,Ur ~~LOC~~: NOT DETECTED
MDMA (Ecstasy)Ur Screen: NOT DETECTED
Methadone Scn, Ur: NOT DETECTED
Opiate, Ur Screen: NOT DETECTED
Phencyclidine (PCP) Ur S: NOT DETECTED
Tricyclic, Ur Screen: NOT DETECTED

## 2022-07-12 LAB — CBC WITH DIFFERENTIAL/PLATELET
Abs Immature Granulocytes: 0.01 10*3/uL (ref 0.00–0.07)
Basophils Absolute: 0 10*3/uL (ref 0.0–0.1)
Basophils Relative: 1 %
Eosinophils Absolute: 0 10*3/uL (ref 0.0–0.5)
Eosinophils Relative: 0 %
HCT: 40.3 % (ref 36.0–46.0)
Hemoglobin: 13.4 g/dL (ref 12.0–15.0)
Immature Granulocytes: 0 %
Lymphocytes Relative: 30 %
Lymphs Abs: 2 10*3/uL (ref 0.7–4.0)
MCH: 30.9 pg (ref 26.0–34.0)
MCHC: 33.3 g/dL (ref 30.0–36.0)
MCV: 93.1 fL (ref 80.0–100.0)
Monocytes Absolute: 0.5 10*3/uL (ref 0.1–1.0)
Monocytes Relative: 8 %
Neutro Abs: 4 10*3/uL (ref 1.7–7.7)
Neutrophils Relative %: 61 %
Platelets: 395 10*3/uL (ref 150–400)
RBC: 4.33 MIL/uL (ref 3.87–5.11)
RDW: 12.9 % (ref 11.5–15.5)
WBC: 6.5 10*3/uL (ref 4.0–10.5)
nRBC: 0 % (ref 0.0–0.2)

## 2022-07-12 LAB — COMPREHENSIVE METABOLIC PANEL
ALT: 27 U/L (ref 0–44)
AST: 20 U/L (ref 15–41)
Albumin: 4.2 g/dL (ref 3.5–5.0)
Alkaline Phosphatase: 43 U/L (ref 38–126)
Anion gap: 4 — ABNORMAL LOW (ref 5–15)
BUN: 19 mg/dL (ref 6–20)
CO2: 26 mmol/L (ref 22–32)
Calcium: 9.2 mg/dL (ref 8.9–10.3)
Chloride: 107 mmol/L (ref 98–111)
Creatinine, Ser: 1.24 mg/dL — ABNORMAL HIGH (ref 0.44–1.00)
GFR, Estimated: 57 mL/min — ABNORMAL LOW (ref >60)
Glucose, Bld: 76 mg/dL (ref 70–99)
Potassium: 4.1 mmol/L (ref 3.5–5.1)
Sodium: 137 mmol/L (ref 135–145)
Total Bilirubin: 0.6 mg/dL (ref 0.3–1.2)
Total Protein: 7.7 g/dL (ref 6.5–8.1)

## 2022-07-12 LAB — URINALYSIS, ROUTINE W REFLEX MICROSCOPIC
Bilirubin Urine: NEGATIVE
Glucose, UA: NEGATIVE mg/dL
Hgb urine dipstick: NEGATIVE
Ketones, ur: NEGATIVE mg/dL
Nitrite: POSITIVE — AB
Protein, ur: 30 mg/dL — AB
Specific Gravity, Urine: 1.028 (ref 1.005–1.030)
pH: 5 (ref 5.0–8.0)

## 2022-07-12 LAB — SALICYLATE LEVEL: Salicylate Lvl: <7 mg/dL — ABNORMAL LOW (ref 7.0–30.0)

## 2022-07-12 LAB — ACETAMINOPHEN LEVEL: Acetaminophen (Tylenol), Serum: <10 ug/mL — ABNORMAL LOW (ref 10–30)

## 2022-07-12 LAB — PREGNANCY, URINE: Preg Test, Ur: NEGATIVE

## 2022-07-12 LAB — ETHANOL: Alcohol, Ethyl (B): <10 mg/dL (ref <10)

## 2022-07-12 MED ORDER — FOSFOMYCIN TROMETHAMINE 3 G PO PACK
3.0000 g | PACK | Freq: Once | ORAL | Status: AC
Start: 2022-07-12 — End: 2022-07-12
  Administered 2022-07-12: 3 g via ORAL
  Filled 2022-07-12 (×2): qty 3

## 2022-07-12 MED ORDER — FOSFOMYCIN TROMETHAMINE 3 G PO PACK: 3.0000 g | PACK | Freq: Once | ORAL | Status: DC

## 2022-07-12 MED ORDER — GABAPENTIN 100 MG PO CAPS
200.0000 mg | ORAL_CAPSULE | Freq: Three times a day (TID) | ORAL | Status: DC
Start: 2022-07-12 — End: 2022-07-13
  Administered 2022-07-12 – 2022-07-13 (×3): 200 mg via ORAL
  Filled 2022-07-12 (×3): qty 2

## 2022-07-12 MED ORDER — LORAZEPAM 1 MG PO TABS
1.0000 mg | ORAL_TABLET | Freq: Once | ORAL | Status: AC
  Administered 2022-07-12: 1 mg via ORAL
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
## 2022-07-12 NOTE — ED Notes (Signed)
Snack and beverage given. 

## 2022-07-12 NOTE — ED Notes (Signed)
Report to include situation, background, assessment and recommendations from [employer]. Patient sleeping, respirations regular and unlabored. Q15 minute rounds and security camera observation to continue.
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
## 2022-07-12 NOTE — ED Notes (Signed)
VOL/Consult Completed/ Pending D/C tomorrow

## 2022-07-12 NOTE — ED Provider Notes (Signed)
[facility] Provider Note    Event Date/Time   First MD Initiated Contact with Patient 07/12/22 [phone]     (approximate)   History   Drug Overdose and Suicidal   HPI  Taylor Wiggins is a 41 y.o. female brought to the ED via EMS from home status post drug overdose.  Patient states she let her husband give her a shot of meth and it "did me backwards".  Endorses SI without plan; states that is why she let him inject her.  Endorses frequency and dysuria.  Denies fever, chills, chest pain, shortness of breath, abdominal/flank pain, nausea, vomiting or dizziness.     Past Medical History   Past Medical History:  Diagnosis Date   Adult ADHD    Anxiety    Asthma    IBS (irritable bowel syndrome)    Opioid use disorder    No longer on buprenorphine therapy   PTSD (post-traumatic stress disorder)      Active Problem List   Patient Active Problem List   Diagnosis Date Noted   Suicidal ideation    Homelessness    Substance induced mood disorder ([place]) 06/12/2022   Methamphetamine abuse ([place]) 06/12/2022   History of opioid abuse ([place]) 06/12/2022   Cannabis use disorder, moderate, dependence ([place]) 06/12/2022   Chronic hepatitis C ([place]) 09/10/2021   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   Dermatitis 11/24/2019   Benign neoplasm of skin of right lower extremity 07/06/2019   History of alcohol dependence ([place]) 05/12/2019   Attention deficit hyperactivity disorder (ADHD), 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   Opioid use disorder 06/10/2018   Opiate addiction ([place]) 04/29/2018   Cellulitis of right hand 04/29/2018   Tobacco abuse 04/29/2018   Anxiety disorder 04/29/2018   Screening for breast cancer 07/03/2016   Drug-seeking behavior 11/25/2014   Alcohol dependence ([place]) 04/28/2013     Past Surgical History   Past Surgical History:  Procedure Laterality Date   BREAST REDUCTION SURGERY  2005     Home Medications   Prior to  Admission medications   Medication Sig Start Date End Date Taking? Authorizing Provider  atomoxetine (STRATTERA) 40 MG capsule Take 1 capsule (40 mg total) by mouth daily. 06/20/22 07/20/22 Yes Massengill, Ovid Curd, MD  clonazePAM (KLONOPIN) 1 MG tablet Take 1 mg by mouth daily as needed for anxiety.   Yes [provider]  hydrOXYzine (ATARAX) 25 MG tablet Take 1 tablet (25 mg total) by mouth 3 (three) times daily as needed for anxiety. 06/19/22 07/19/22 Yes Massengill, Ovid Curd, MD  lamoTRIgine (LAMICTAL) 25 MG tablet Take 1 tablet (25 mg total) by mouth daily. 06/20/22 07/20/22 Yes Massengill, Ovid Curd, MD  nicotine polacrilex (NICORETTE) 2 MG gum Take 1 each (2 mg total) by mouth as needed for smoking cessation. 06/19/22 07/19/22 Yes Massengill, Ovid Curd, MD  omeprazole (PRILOSEC) 20 MG capsule Take 1 capsule (20 mg total) by mouth daily. Patient taking differently: Take 20 mg by mouth daily as needed (For heartburn or acid reflux). 06/09/22  Yes Molpus, Jenny Reichmann, MD     Allergies  Tramadol and Mold extract [trichophyton]   Family History  History reviewed. No pertinent family history.   Physical Exam  Triage Vital Signs: ED Triage Vitals  Enc Vitals Group     BP 07/12/22 0034 104/73     Pulse Rate 07/12/22 0034 87     Resp 07/12/22 0034 18     Temp 07/12/22 0034 98.4 F (36.9 C)  Temp Source 07/12/22 0034 Oral     SpO2 07/12/22 0034 99 %     Weight 07/12/22 0034 135 lb (61.2 kg)     Height 07/12/22 0034 '5\' 7"'$  (1.702 m)     Head Circumference --      Peak Flow --      Pain Score 07/12/22 0037 0     Pain Loc --      Pain Edu? --      Excl. in [place]? --     Updated Vital Signs: BP 104/73 (BP Location: Left Arm)   Pulse 87   Temp 98.4 F (36.9 C) (Oral)   Resp 18   Ht '5\' 7"'$  (1.702 m)   Wt 61.2 kg   LMP 06/22/2022 (Exact Date)   SpO2 99%   BMI 21.14 kg/m    General: Awake, no distress.  Disheveled. CV:  RRR.  Good peripheral perfusion.  Resp:  Normal effort.  CTA  B. Abd:  Nontender to light or deep palpation.  No CVAT.  No truncal vesicles..  No distention.  Other:  Restless.   ED Results / Procedures / Treatments  Labs (all labs ordered are listed, but only abnormal results are displayed) Labs Reviewed  COMPREHENSIVE METABOLIC PANEL - Abnormal; Notable for the following components:      Result Value   Creatinine, Ser 1.24 (*)    GFR, Estimated 57 (*)    Anion gap 4 (*)    All other components within normal limits  URINALYSIS, ROUTINE W REFLEX MICROSCOPIC - Abnormal; Notable for the following components:   Color, Urine YELLOW (*)    APPearance CLOUDY (*)    Protein, ur 30 (*)    Nitrite POSITIVE (*)    Leukocytes,Ua TRACE (*)    Bacteria, UA MANY (*)    All other components within normal limits  URINE DRUG SCREEN, QUALITATIVE ([facility] ONLY) - Abnormal; Notable for the following components:   Amphetamines, Ur Screen POSITIVE (*)    Benzodiazepine, Ur Scrn POSITIVE (*)    All other components within normal limits  ACETAMINOPHEN LEVEL - Abnormal; Notable for the following components:   Acetaminophen (Tylenol), Serum <10 (*)    All other components within normal limits  SALICYLATE LEVEL - Abnormal; Notable for the following components:   Salicylate Lvl <7.6 (*)    All other components within normal limits  URINE CULTURE  CBC WITH DIFFERENTIAL/PLATELET  ETHANOL  PREGNANCY, URINE  POC URINE PREG, ED     EKG  ED ECG REPORT I, Willard Farquharson J, the attending physician, personally viewed and interpreted this ECG.   Date: 07/12/2022  EKG Time: 0038  Rate: 98  Rhythm: normal sinus rhythm  Axis: Normal  Intervals:none  ST&T Change: Nonspecific    RADIOLOGY None   Official radiology report(s): No results found.   PROCEDURES:  Critical Care performed: No  Procedures   MEDICATIONS ORDERED IN ED: Medications  fosfomycin (MONUROL) packet 3 g (3 g Oral Given 07/12/22 0328)  LORazepam (ATIVAN) tablet 1 mg (1 mg Oral Given 07/12/22  0447)     IMPRESSION / MDM / ASSESSMENT AND PLAN / ED COURSE  I reviewed the triage vital signs and the nursing notes.                             41 year old female presents after methamphetamine injection and vague suicidal ideation.  I have personally reviewed patient's records and see that she had  a recent urgent care visit on 07/06/2022 for SI.  She was last admitted to behavioral health on 06/11/2022.  Patient's presentation is most consistent with exacerbation of chronic illness.  Patient contracts for safety while in the emergency department.  She was treated with fosfomycin.  Will administer 1 mg oral Ativan for restlessness.  The patient has been placed in psychiatric observation due to the need to provide a safe environment for the patient while obtaining psychiatric consultation and evaluation, as well as ongoing medical and medication management to treat the patient's condition.  The patient has not been placed under full IVC at this time.        FINAL CLINICAL IMPRESSION(S) / ED DIAGNOSES   Final diagnoses:  Methamphetamine abuse (HCC)  Depression, unspecified depression type  Substance induced mood disorder ([place])  Urinary tract infection without hematuria, site unspecified     Rx / DC Orders   ED Discharge Orders     None        Note:  This document was prepared using Dragon voice recognition software and may include unintentional dictation errors.   Paulette Blanch, MD 07/12/22 [phone]

## 2022-07-12 NOTE — Consult Note (Signed)
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 female patient admitted with meth abuse with suicidal ideations.  HPI:  41 yo female presented to the ED after using meth and having the opposite reaction.  She had her husband/exhusband inject her, long history of substance abuse.  Her parents who she lives with were upset about her drug use and called the police which she does not understand.  No suicidal/homicidal ideations, hallucinations, or hallucinations.  She is still under the influence and will be started on gabapentin and discharged to outpatient services tomorrow as she has no interest in rehab.  Client is aware of the plan, follow up with [facility].  Past Psychiatric History: polysubstance abuse  Risk to Self:  none Risk to Others:  none Prior Inpatient Therapy:  rehab Prior Outpatient Therapy:  [facility]  Past Medical History:  Past Medical History:  Diagnosis Date   Adult ADHD    Anxiety    Asthma    IBS (irritable bowel syndrome)    Opioid use disorder    No longer on buprenorphine therapy   PTSD (post-traumatic stress disorder)     Past Surgical History:  Procedure Laterality Date   BREAST REDUCTION SURGERY  2005   Family History: History reviewed. No pertinent family history. Family Psychiatric  History: none Social History:  Social History   Substance and Sexual Activity  Alcohol Use No     Social History   Substance and Sexual Activity  Drug Use Yes   Types: Benzodiazepines, Marijuana, Methamphetamines   Comment: recently    Social History   Socioeconomic History   Marital status: Single    Spouse  name: Not on file   Number of children: Not on file   Years of education: Not on file   Highest education level: Not on file  Occupational History   Not on file  Tobacco Use   Smoking status: Every Day    Packs/day: 1.00    Types: Cigarettes    Last attempt to quit: 09/06/2014    Years since quitting: 7.8   Smokeless tobacco: Never  Substance and Sexual Activity   Alcohol use: No   Drug use: Yes    Types: Benzodiazepines, Marijuana, Methamphetamines    Comment: recently   Sexual activity: Never    Birth control/protection: Abstinence  Other Topics Concern   Not on file  Social History Narrative   41 y/o Caucasian female. Married and homeless with polysubstance abuse. Per pt-Graduated from [employer] in 2006 with bachelors degree in [place].   Social Determinants of Health   Financial Resource Strain: Not on file  Food Insecurity: Not on file  Transportation Needs: Not on file  Physical Activity: Not on file  Stress: Not on file  Social Connections: Not on file   Additional Social History:    Allergies:   Allergies  Allergen Reactions   Tramadol Other (See Comments)    Seizures    Mold Extract [Trichophyton] Other (See Comments)    Sneezing    Labs:  Results for orders placed or performed during the hospital encounter of 07/12/22 (from the past 48 hour(s))  Comprehensive metabolic panel  Status: Abnormal   Collection Time: 07/12/22 12:34 AM  Result Value Ref Range   Sodium 137 135 - 145 mmol/L   Potassium 4.1 3.5 - 5.1 mmol/L   Chloride 107 98 - 111 mmol/L   CO2 26 22 - 32 mmol/L   Glucose, Bld 76 70 - 99 mg/dL    Comment: Glucose reference range applies only to samples taken after fasting for at least 8 hours.   BUN 19 6 - 20 mg/dL   Creatinine, Ser 1.24 (H) 0.44 - 1.00 mg/dL   Calcium 9.2 8.9 - 10.3 mg/dL   Total Protein 7.7 6.5 - 8.1 g/dL   Albumin 4.2 3.5 - 5.0 g/dL   AST 20 15 - 41 U/L   ALT 27 0 - 44 U/L   Alkaline  Phosphatase 43 38 - 126 U/L   Total Bilirubin 0.6 0.3 - 1.2 mg/dL   GFR, Estimated 57 (L) >60 mL/min    Comment: (NOTE) Calculated using the CKD-EPI Creatinine Equation (2021)    Anion gap 4 (L) 5 - 15    Comment: Performed at [facility], [address] [place], [place] [address]  CBC with Differential     Status: None   Collection Time: 07/12/22 12:34 AM  Result Value Ref Range   WBC 6.5 4.0 - 10.5 K/uL   RBC 4.33 3.87 - 5.11 MIL/uL   Hemoglobin 13.4 12.0 - 15.0 g/dL   HCT 40.3 36.0 - 46.0 %   MCV 93.1 80.0 - 100.0 fL   MCH 30.9 26.0 - 34.0 pg   MCHC 33.3 30.0 - 36.0 g/dL   RDW 12.9 11.5 - 15.5 %   Platelets 395 150 - 400 K/uL   nRBC 0.0 0.0 - 0.2 %   Neutrophils Relative % 61 %   Neutro Abs 4.0 1.7 - 7.7 K/uL   Lymphocytes Relative 30 %   Lymphs Abs 2.0 0.7 - 4.0 K/uL   Monocytes Relative 8 %   Monocytes Absolute 0.5 0.1 - 1.0 K/uL   Eosinophils Relative 0 %   Eosinophils Absolute 0.0 0.0 - 0.5 K/uL   Basophils Relative 1 %   Basophils Absolute 0.0 0.0 - 0.1 K/uL   Immature Granulocytes 0 %   Abs Immature Granulocytes 0.01 0.00 - 0.07 K/uL    Comment: Performed at [facility], [address] [place], [place] [address]  Urinalysis, Routine w reflex microscopic Urine, Clean Catch     Status: Abnormal   Collection Time: 07/12/22 12:34 AM  Result Value Ref Range   Color, Urine YELLOW (A) YELLOW   APPearance CLOUDY (A) CLEAR   Specific Gravity, Urine 1.028 1.005 - 1.030   pH 5.0 5.0 - 8.0   Glucose, UA NEGATIVE NEGATIVE mg/dL   Hgb urine dipstick NEGATIVE NEGATIVE   Bilirubin Urine NEGATIVE NEGATIVE   Ketones, ur NEGATIVE NEGATIVE mg/dL   Protein, ur 30 (A) NEGATIVE mg/dL   Nitrite POSITIVE (A) NEGATIVE   Leukocytes,Ua TRACE (A) NEGATIVE   RBC / HPF 0-5 0 - 5 RBC/hpf   WBC, UA 11-20 0 - 5 WBC/hpf   Bacteria, UA MANY (A) NONE SEEN   Squamous Epithelial / LPF 6-10 0 - 5   Mucus PRESENT     Comment: Performed at [facility], [address], [place], [place] [address]  Urine Drug Screen, Qualitative ([facility] only)     Status: Abnormal   Collection Time: 07/12/22 12:34 AM  Result Value Ref Range   Tricyclic, Ur Screen  NONE DETECTED NONE DETECTED   Amphetamines, Ur Screen POSITIVE (A) NONE DETECTED   MDMA (Ecstasy)Ur Screen NONE DETECTED NONE DETECTED   Cocaine Metabolite,Ur [place] NONE DETECTED NONE DETECTED   Opiate, Ur Screen NONE DETECTED NONE DETECTED   Phencyclidine (PCP) Ur S NONE DETECTED NONE DETECTED   Cannabinoid 50 Ng, Ur [place] NONE DETECTED NONE DETECTED   Barbiturates, Ur Screen NONE DETECTED NONE DETECTED   Benzodiazepine, Ur Scrn POSITIVE (A) NONE DETECTED   Methadone Scn, Ur NONE DETECTED NONE DETECTED    Comment: (NOTE) Tricyclics + metabolites, urine    Cutoff 1000 ng/mL Amphetamines + metabolites, urine  Cutoff 1000 ng/mL MDMA (Ecstasy), urine              Cutoff 500 ng/mL Cocaine Metabolite, urine          Cutoff 300 ng/mL Opiate + metabolites, urine        Cutoff 300 ng/mL Phencyclidine (PCP), urine         Cutoff 25 ng/mL Cannabinoid, urine                 Cutoff 50 ng/mL Barbiturates + metabolites, urine  Cutoff 200 ng/mL Benzodiazepine, urine              Cutoff 200 ng/mL Methadone, urine                   Cutoff 300 ng/mL  The urine drug screen provides only a preliminary, unconfirmed analytical test result and should not be used for non-medical purposes. Clinical consideration and professional judgment should be applied to any positive drug screen result due to possible interfering substances. A more specific alternate chemical method must be used in order to obtain a confirmed analytical result. Gas chromatography / mass spectrometry (GC/MS) is the preferred confirm atory method. Performed at [facility], [address], [place], [place] [address]   Acetaminophen level     Status: Abnormal   Collection Time: 07/12/22 12:34 AM  Result Value Ref Range   Acetaminophen (Tylenol),  Serum <10 (L) 10 - 30 ug/mL    Comment: (NOTE) Therapeutic concentrations vary significantly. A range of 10-30 ug/mL  may be an effective concentration for many patients. However, some  are best treated at concentrations outside of this range. Acetaminophen concentrations >150 ug/mL at 4 hours after ingestion  and >50 ug/mL at 12 hours after ingestion are often associated with  toxic reactions.  Performed at [facility], [address] [place], [place] [address]   Salicylate level     Status: Abnormal   Collection Time: 07/12/22 12:34 AM  Result Value Ref Range   Salicylate Lvl <2.4 (L) 7.0 - 30.0 mg/dL    Comment: Performed at [facility], [address] [place], [place] [address]  Ethanol     Status: None   Collection Time: 07/12/22 12:34 AM  Result Value Ref Range   Alcohol, Ethyl (B) <10 <10 mg/dL    Comment: (NOTE) Lowest detectable limit for serum alcohol is 10 mg/dL.  For medical purposes only. Performed at [facility], [address] [place], [place] [address]   Pregnancy, urine     Status: None   Collection Time: 07/12/22 12:34 AM  Result Value Ref Range   Preg Test, Ur NEGATIVE NEGATIVE    Comment: Performed at [facility], [address] [place], [place] [address]    No current facility-administered medications for this encounter.   Current Outpatient Medications  Medication  Sig Dispense Refill   atomoxetine (STRATTERA) 40 MG capsule Take 1 capsule (40 mg total) by mouth daily. 30 capsule 0   clonazePAM (KLONOPIN) 1 MG tablet Take 1 mg by mouth daily as needed for anxiety.     hydrOXYzine (ATARAX) 25 MG tablet Take 1 tablet (25 mg total) by mouth 3 (three) times daily as needed for anxiety. 30 tablet 0   lamoTRIgine (LAMICTAL) 25 MG tablet Take 1 tablet (25 mg total) by mouth daily. 30 tablet 0   nicotine polacrilex (NICORETTE) 2 MG gum Take 1 each (2 mg total) by mouth as needed for smoking cessation. 100 tablet 0    omeprazole (PRILOSEC) 20 MG capsule Take 1 capsule (20 mg total) by mouth daily. (Patient taking differently: Take 20 mg by mouth daily as needed (For heartburn or acid reflux).) 30 capsule 0    Musculoskeletal: Strength & Muscle Tone: within normal limits Gait & Station: normal Patient leans: N/A  Psychiatric Specialty Exam: Physical Exam Vitals and nursing note reviewed.  Constitutional:      Appearance: Normal appearance.  HENT:     Head: Normocephalic.     Nose: Nose normal.  Pulmonary:     Effort: Pulmonary effort is normal.  Musculoskeletal:        General: Normal range of motion.     Cervical back: Normal range of motion.  Neurological:     General: No focal deficit present.     Mental Status: She is alert and oriented to person, place, and time.  Psychiatric:        Attention and Perception: Attention and perception normal.        Mood and Affect: Mood is anxious and depressed.        Speech: Speech normal.        Behavior: Behavior normal. Behavior is cooperative.        Thought Content: Thought content normal.        Cognition and Memory: Cognition and memory normal.        Judgment: Judgment normal.     Review of Systems  Psychiatric/Behavioral:  Positive for depression and substance abuse. The patient is nervous/anxious.   All other systems reviewed and are negative.   Blood pressure 120/77, pulse 90, temperature 98.7 F (37.1 C), temperature source Oral, resp. rate 16, height '5\' 7"'$  (1.702 m), weight 61.2 kg, last menstrual period 06/22/2022, SpO2 99 %.Body mass index is 21.14 kg/m.  General Appearance: Casual  Eye Contact:  Good  Speech:  Clear and Coherent  Volume:  Normal  Mood:  Anxious and Depressed  Affect:  Congruent  Thought Process:  Coherent and Descriptions of Associations: Intact  Orientation:  Full (Time, Place, and Person)  Thought Content:  WDL and Logical  Suicidal Thoughts:  No  Homicidal Thoughts:  No  Memory:  Immediate;    Good Recent;   Good Remote;   Good  Judgement:  Fair  Insight:  Fair  Psychomotor Activity:  Normal  Concentration:  Concentration: Good and Attention Span: Good  Recall:  Good  Fund of Knowledge:  Good  Language:  Good  Akathisia:  No  Handed:  Right  AIMS (if indicated):     Assets:  Housing Leisure Time Physical Health Resilience Social Support  ADL's:  Intact  Cognition:  WNL  Sleep:        Physical Exam: Physical Exam Vitals and nursing note reviewed.  Constitutional:      Appearance: Normal appearance.  HENT:  Head: Normocephalic.     Nose: Nose normal.  Pulmonary:     Effort: Pulmonary effort is normal.  Musculoskeletal:        General: Normal range of motion.     Cervical back: Normal range of motion.  Neurological:     General: No focal deficit present.     Mental Status: She is alert and oriented to person, place, and time.  Psychiatric:        Attention and Perception: Attention and perception normal.        Mood and Affect: Mood is anxious and depressed.        Speech: Speech normal.        Behavior: Behavior normal. Behavior is cooperative.        Thought Content: Thought content normal.        Cognition and Memory: Cognition and memory normal.        Judgment: Judgment normal.    Review of Systems  Psychiatric/Behavioral:  Positive for depression and substance abuse. The patient is nervous/anxious.   All other systems reviewed and are negative.  Blood pressure 120/77, pulse 90, temperature 98.7 F (37.1 C), temperature source Oral, resp. rate 16, height '5\' 7"'$  (1.702 m), weight 61.2 kg, last menstrual period 06/22/2022, SpO2 99 %. Body mass index is 21.14 kg/m.  Treatment Plan Summary: Daily contact with patient to assess and evaluate symptoms and progress in treatment, Medication management, and Plan : Methamphetamine induced mood disorder: STarted gabapentin 100 mg TID Follow up with [facility]  Disposition: No evidence of imminent risk to  self or others at present.   Patient does not meet criteria for psychiatric inpatient admission. Supportive therapy provided about ongoing stressors.  Waylan Boga, NP 07/12/2022 12:12 PM

## 2022-07-12 NOTE — ED Notes (Addendum)
Pt dressed out by this RN & EDT Jenny Reichmann) belongings include:   1 pair of tan sandals 1 pair of black jeans 1 black shirt  1 white bra 1 black hair tie 1 black pair of underwear

## 2022-07-12 NOTE — ED Notes (Signed)
Pt endorses SI with plan to walk into traffic. Pt was falling asleep during the assessment and had to be woken up several times. Pt then apologized to Probation officer saying she didn't sleep well last night. Denies HI/AVH at this time. Awaiting psychiatry consult.

## 2022-07-13 MED ORDER — CEPHALEXIN 500 MG PO CAPS: 500.0000 mg | ORAL_CAPSULE | Freq: Two times a day (BID) | ORAL | 0 refills | Status: AC

## 2022-07-13 MED ORDER — HYDROXYZINE HCL 25 MG PO TABS: 25.0000 mg | ORAL_TABLET | Freq: Three times a day (TID) | ORAL | Status: DC | PRN

## 2022-07-13 MED ORDER — PANTOPRAZOLE SODIUM 40 MG PO TBEC
40.0000 mg | DELAYED_RELEASE_TABLET | Freq: Every day | ORAL | Status: DC
  Administered 2022-07-13: 40 mg via ORAL
  Filled 2022-07-13: qty 1

## 2022-07-13 MED ORDER — ATOMOXETINE HCL 10 MG PO CAPS
40.0000 mg | ORAL_CAPSULE | Freq: Every day | ORAL | Status: DC
  Administered 2022-07-13: 40 mg via ORAL
  Filled 2022-07-13: qty 4

## 2022-07-13 MED ORDER — LAMOTRIGINE 25 MG PO TABS
25.0000 mg | ORAL_TABLET | Freq: Every day | ORAL | Status: DC
  Administered 2022-07-13: 25 mg via ORAL
  Filled 2022-07-13: qty 1

## 2022-07-13 MED ORDER — CEPHALEXIN 500 MG PO CAPS
500.0000 mg | ORAL_CAPSULE | Freq: Once | ORAL | Status: AC
  Administered 2022-07-13: 500 mg via ORAL
  Filled 2022-07-13 (×2): qty 1

## 2022-07-13 NOTE — Discharge Instructions (Signed)
Have started you on some antibiotics for UTI.  Is important that you take these to help prevent worsening infection and return to the ER if you develop fevers, worsening pain or any other concerns.  You were cleared by psychiatry for discharge

## 2022-07-13 NOTE — ED Notes (Signed)
VOL/pending discharge 

## 2022-07-13 NOTE — ED Notes (Signed)
Pt given breakfast tray and drink 

## 2022-07-13 NOTE — ED Provider Notes (Addendum)
Emergency Medicine Observation Re-evaluation Note  Taylor Wiggins is a 41 y.o. female, seen on rounds today.  Pt initially presented to the ED for complaints of Drug Overdose and Suicidal  Currently, the patient is resting- no issues per [facility] nurse   Physical Exam  Blood pressure 107/65, pulse 90, temperature 97.9 F (36.6 C), temperature source Oral, resp. rate 18, height '5\' 7"'$  (1.702 m), weight 61.2 kg, last menstrual period 06/22/2022, SpO2 97 %.  Physical Exam General: No apparent distress Pulm: Normal WOB Neuro: resting      ED Course / MDM     I have reviewed the labs performed to date as well as medications administered while in observation.  Recent changes in the last 24 hours include none  Plan   Current plan is to continue to wait for placement  Patient is not under full IVC at this time.   Vanessa [place], MD 07/13/22 0746   10:30 AM patient cleared for discharge by psychiatry we will recheck blood pressure given last home was slightly low.  Repeat blood pressure is 98/70.  Patient's urine came back with greater than 1000 colony suspect E. coli will start on some Keflex and she can follow-up return if symptoms are worsening or develops any other concerns.  Reevaluated patient and she reports that typically her blood pressures run like this denies any lightheadedness or dizziness.  Denies any fevers.  Abdomen soft and nontender.  No CVA tenderness.  No evidence of sepsis or bacteremia.  Patient does report that she will be able to take the antibiotics we will give her a dose of Keflex before doing discharge.  She expressed understanding felt comfortable with with this plan     Vanessa [place], MD 07/13/22 1034    Vanessa [place], MD 07/13/22 1227

## 2022-07-13 NOTE — Consult Note (Signed)
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 female patient admitted with meth abuse with suicidal ideations.  Today, she is clear and coherent.  No suicidal/homicidal ideations or hallucinations.  She reports she feels better from the meth use, denies withdrawal symptoms.  Encouraged her to follow up with [facility] IOP for substance abuse, information provided.  HPI on admission:  41 yo female presented to the ED after using meth and having the opposite reaction.  She had her husband/exhusband inject her, long history of substance abuse.  Her parents who she lives with were upset about her drug use and called the police which she does not understand.  No suicidal/homicidal ideations, hallucinations, or hallucinations.  She is still under the influence and will be started on gabapentin and discharged to outpatient services tomorrow as she has no interest in rehab.  Client is aware of the plan, follow up with [facility].  Past Psychiatric History: polysubstance abuse  Risk to Self:  none Risk to Others:  none Prior Inpatient Therapy:  rehab Prior Outpatient Therapy:  [facility]  Past Medical History:  Past Medical History:  Diagnosis Date   Adult ADHD    Anxiety    Asthma    IBS (irritable bowel syndrome)    Opioid use disorder    No longer on buprenorphine therapy   PTSD (post-traumatic stress disorder)     Past Surgical History:  Procedure Laterality Date   BREAST REDUCTION SURGERY  2005   Family History: History reviewed. No pertinent family history. Family Psychiatric  History: none Social History:  Social History   Substance and  Sexual Activity  Alcohol Use No     Social History   Substance and Sexual Activity  Drug Use Yes   Types: Benzodiazepines, Marijuana, Methamphetamines   Comment: recently    Social History   Socioeconomic History   Marital status: Single    Spouse name: Not on file   Number of children: Not on file   Years of education: Not on file   Highest education level: Not on file  Occupational History   Not on file  Tobacco Use   Smoking status: Every Day    Packs/day: 1.00    Types: Cigarettes    Last attempt to quit: 09/06/2014    Years since quitting: 7.8   Smokeless tobacco: Never  Substance and Sexual Activity   Alcohol use: No   Drug use: Yes    Types: Benzodiazepines, Marijuana, Methamphetamines    Comment: recently   Sexual activity: Never    Birth control/protection: Abstinence  Other Topics Concern   Not on file  Social History Narrative   41 y/o Caucasian female. Married and homeless with polysubstance abuse. Per pt-Graduated from [employer] in 2006 with bachelors degree in [employer].   Social Determinants of Health   Financial Resource Strain: Not on file  Food Insecurity: Not on file  Transportation Needs: Not on file  Physical Activity: Not on file  Stress: Not on file  Social Connections: Not on file   Additional Social History:    Allergies:   Allergies  Allergen Reactions   Tramadol Other (See Comments)    Seizures  Mold Extract [Trichophyton] Other (See Comments)    Sneezing    Labs:  Results for orders placed or performed during the hospital encounter of 07/12/22 (from the past 48 hour(s))  Comprehensive metabolic panel     Status: Abnormal   Collection Time: 07/12/22 12:34 AM  Result Value Ref Range   Sodium 137 135 - 145 mmol/L   Potassium 4.1 3.5 - 5.1 mmol/L   Chloride 107 98 - 111 mmol/L   CO2 26 22 - 32 mmol/L   Glucose, Bld 76 70 - 99 mg/dL    Comment: Glucose reference range applies only to samples taken after  fasting for at least 8 hours.   BUN 19 6 - 20 mg/dL   Creatinine, Ser 1.24 (H) 0.44 - 1.00 mg/dL   Calcium 9.2 8.9 - 10.3 mg/dL   Total Protein 7.7 6.5 - 8.1 g/dL   Albumin 4.2 3.5 - 5.0 g/dL   AST 20 15 - 41 U/L   ALT 27 0 - 44 U/L   Alkaline Phosphatase 43 38 - 126 U/L   Total Bilirubin 0.6 0.3 - 1.2 mg/dL   GFR, Estimated 57 (L) >60 mL/min    Comment: (NOTE) Calculated using the CKD-EPI Creatinine Equation (2021)    Anion gap 4 (L) 5 - 15    Comment: Performed at [facility], [address] [place], [place] [address]  CBC with Differential     Status: None   Collection Time: 07/12/22 12:34 AM  Result Value Ref Range   WBC 6.5 4.0 - 10.5 K/uL   RBC 4.33 3.87 - 5.11 MIL/uL   Hemoglobin 13.4 12.0 - 15.0 g/dL   HCT 40.3 36.0 - 46.0 %   MCV 93.1 80.0 - 100.0 fL   MCH 30.9 26.0 - 34.0 pg   MCHC 33.3 30.0 - 36.0 g/dL   RDW 12.9 11.5 - 15.5 %   Platelets 395 150 - 400 K/uL   nRBC 0.0 0.0 - 0.2 %   Neutrophils Relative % 61 %   Neutro Abs 4.0 1.7 - 7.7 K/uL   Lymphocytes Relative 30 %   Lymphs Abs 2.0 0.7 - 4.0 K/uL   Monocytes Relative 8 %   Monocytes Absolute 0.5 0.1 - 1.0 K/uL   Eosinophils Relative 0 %   Eosinophils Absolute 0.0 0.0 - 0.5 K/uL   Basophils Relative 1 %   Basophils Absolute 0.0 0.0 - 0.1 K/uL   Immature Granulocytes 0 %   Abs Immature Granulocytes 0.01 0.00 - 0.07 K/uL    Comment: Performed at [facility], [address] [place], [place] [address]  Urinalysis, Routine w reflex microscopic Urine, Clean Catch     Status: Abnormal   Collection Time: 07/12/22 12:34 AM  Result Value Ref Range   Color, Urine YELLOW (A) YELLOW   APPearance CLOUDY (A) CLEAR   Specific Gravity, Urine 1.028 1.005 - 1.030   pH 5.0 5.0 - 8.0   Glucose, UA NEGATIVE NEGATIVE mg/dL   Hgb urine dipstick NEGATIVE NEGATIVE   Bilirubin Urine NEGATIVE NEGATIVE   Ketones, ur NEGATIVE NEGATIVE mg/dL   Protein, ur 30 (A) NEGATIVE mg/dL   Nitrite POSITIVE (A)  NEGATIVE   Leukocytes,Ua TRACE (A) NEGATIVE   RBC / HPF 0-5 0 - 5 RBC/hpf   WBC, UA 11-20 0 - 5 WBC/hpf   Bacteria, UA MANY (A) NONE SEEN   Squamous Epithelial / LPF 6-10 0 - 5   Mucus PRESENT     Comment: Performed at  [facility], [address], [place], [place] [address]  Urine Drug Screen, Qualitative [facility] only)     Status: Abnormal   Collection Time: 07/12/22 12:34 AM  Result Value Ref Range   Tricyclic, Ur Screen NONE DETECTED NONE DETECTED   Amphetamines, Ur Screen POSITIVE (A) NONE DETECTED   MDMA (Ecstasy)Ur Screen NONE DETECTED NONE DETECTED   Cocaine Metabolite,Ur [place] NONE DETECTED NONE DETECTED   Opiate, Ur Screen NONE DETECTED NONE DETECTED   Phencyclidine (PCP) Ur S NONE DETECTED NONE DETECTED   Cannabinoid 50 Ng, Ur [place] NONE DETECTED NONE DETECTED   Barbiturates, Ur Screen NONE DETECTED NONE DETECTED   Benzodiazepine, Ur Scrn POSITIVE (A) NONE DETECTED   Methadone Scn, Ur NONE DETECTED NONE DETECTED    Comment: (NOTE) Tricyclics + metabolites, urine    Cutoff 1000 ng/mL Amphetamines + metabolites, urine  Cutoff 1000 ng/mL MDMA (Ecstasy), urine              Cutoff 500 ng/mL Cocaine Metabolite, urine          Cutoff 300 ng/mL Opiate + metabolites, urine        Cutoff 300 ng/mL Phencyclidine (PCP), urine         Cutoff 25 ng/mL Cannabinoid, urine                 Cutoff 50 ng/mL Barbiturates + metabolites, urine  Cutoff 200 ng/mL Benzodiazepine, urine              Cutoff 200 ng/mL Methadone, urine                   Cutoff 300 ng/mL  The urine drug screen provides only a preliminary, unconfirmed analytical test result and should not be used for non-medical purposes. Clinical consideration and professional judgment should be applied to any positive drug screen result due to possible interfering substances. A more specific alternate chemical method must be used in order to obtain a confirmed analytical result. Gas chromatography / mass spectrometry (GC/MS) is  the preferred confirm atory method. Performed at [facility], [address] [place], [place] [address]   Acetaminophen level     Status: Abnormal   Collection Time: 07/12/22 12:34 AM  Result Value Ref Range   Acetaminophen (Tylenol), Serum <10 (L) 10 - 30 ug/mL    Comment: (NOTE) Therapeutic concentrations vary significantly. A range of 10-30 ug/mL  may be an effective concentration for many patients. However, some  are best treated at concentrations outside of this range. Acetaminophen concentrations >150 ug/mL at 4 hours after ingestion  and >50 ug/mL at 12 hours after ingestion are often associated with  toxic reactions.  Performed at [facility], [address] [place], [place] [address]   Salicylate level     Status: Abnormal   Collection Time: 07/12/22 12:34 AM  Result Value Ref Range   Salicylate Lvl <7.4 (L) 7.0 - 30.0 mg/dL    Comment: Performed at [facility], [address] [place], [place] [address]  Ethanol     Status: None   Collection Time: 07/12/22 12:34 AM  Result Value Ref Range   Alcohol, Ethyl (B) <10 <10 mg/dL    Comment: (NOTE) Lowest detectable limit for serum alcohol is 10 mg/dL.  For medical purposes only. Performed at [facility], [address], [place], [place] [address]   Urine Culture     Status: Abnormal (Preliminary result)   Collection Time: 07/12/22 12:34 AM   Specimen:  Urine, Clean Catch  Result Value Ref Range   Specimen Description      URINE, CLEAN CATCH Performed at [facility], [address] [place], [place] [address]    Special Requests      NONE Performed at [facility], [place], [place] [address]    Culture (A)     >=100,000 COLONIES/mL ESCHERICHIA COLI SUSCEPTIBILITIES TO FOLLOW Performed at [facility], [place] [address] [place], [place] [address]    Report Status PENDING   Pregnancy, urine     Status: None   Collection Time:  07/12/22 12:34 AM  Result Value Ref Range   Preg Test, Ur NEGATIVE NEGATIVE    Comment: Performed at [facility], [address] [place], [place] [address]    Current Facility-Administered Medications  Medication Dose Route Frequency Provider Last Rate Last Admin   atomoxetine (STRATTERA) capsule 40 mg  40 mg Oral Daily Vladimir Crofts, MD   40 mg at 07/13/22 1044   gabapentin (NEURONTIN) capsule 200 mg  200 mg Oral TID Patrecia Pour, NP   200 mg at 07/13/22 0950   hydrOXYzine (ATARAX) tablet 25 mg  25 mg Oral TID PRN Vladimir Crofts, MD       lamoTRIgine (LAMICTAL) tablet 25 mg  25 mg Oral Daily Vladimir Crofts, MD   25 mg at 07/13/22 0950   pantoprazole (PROTONIX) EC tablet 40 mg  40 mg Oral Daily Vladimir Crofts, MD   40 mg at 07/13/22 3664   Current Outpatient Medications  Medication Sig Dispense Refill   atomoxetine (STRATTERA) 40 MG capsule Take 1 capsule (40 mg total) by mouth daily. 30 capsule 0   cephALEXin (KEFLEX) 500 MG capsule Take 1 capsule (500 mg total) by mouth 2 (two) times daily for 7 days. 14 capsule 0   clonazePAM (KLONOPIN) 1 MG tablet Take 1 mg by mouth daily as needed for anxiety.     hydrOXYzine (ATARAX) 25 MG tablet Take 1 tablet (25 mg total) by mouth 3 (three) times daily as needed for anxiety. 30 tablet 0   lamoTRIgine (LAMICTAL) 25 MG tablet Take 1 tablet (25 mg total) by mouth daily. 30 tablet 0   nicotine polacrilex (NICORETTE) 2 MG gum Take 1 each (2 mg total) by mouth as needed for smoking cessation. 100 tablet 0   omeprazole (PRILOSEC) 20 MG capsule Take 1 capsule (20 mg total) by mouth daily. (Patient taking differently: Take 20 mg by mouth daily as needed (For heartburn or acid reflux).) 30 capsule 0    Musculoskeletal: Strength & Muscle Tone: within normal limits Gait & Station: normal Patient leans: N/A  Psychiatric Specialty Exam: Physical Exam Vitals and nursing note reviewed.  Constitutional:      Appearance: Normal appearance.  HENT:      Head: Normocephalic.     Nose: Nose normal.  Pulmonary:     Effort: Pulmonary effort is normal.  Musculoskeletal:        General: Normal range of motion.     Cervical back: Normal range of motion.  Neurological:     General: No focal deficit present.     Mental Status: She is alert and oriented to person, place, and time.  Psychiatric:        Attention and Perception: Attention and perception normal.        Mood and Affect: Mood is anxious and depressed.        Speech: Speech normal.  Behavior: Behavior normal. Behavior is cooperative.        Thought Content: Thought content normal.        Cognition and Memory: Cognition and memory normal.        Judgment: Judgment normal.     Review of Systems  Psychiatric/Behavioral:  Positive for depression and substance abuse. The patient is nervous/anxious.   All other systems reviewed and are negative.   Blood pressure 96/64, pulse 99, temperature 98.4 F (36.9 C), temperature source Oral, resp. rate 18, height '5\' 7"'$  (1.702 m), weight 61.2 kg, last menstrual period 06/22/2022, SpO2 97 %.Body mass index is 21.14 kg/m.  General Appearance: Casual  Eye Contact:  Good  Speech:  Clear and Coherent  Volume:  Normal  Mood:  Anxious and Depressed  Affect:  Congruent  Thought Process:  Coherent and Descriptions of Associations: Intact  Orientation:  Full (Time, Place, and Person)  Thought Content:  WDL and Logical  Suicidal Thoughts:  No  Homicidal Thoughts:  No  Memory:  Immediate;   Good Recent;   Good Remote;   Good  Judgement:  Fair  Insight:  Fair  Psychomotor Activity:  Normal  Concentration:  Concentration: Good and Attention Span: Good  Recall:  Good  Fund of Knowledge:  Good  Language:  Good  Akathisia:  No  Handed:  Right  AIMS (if indicated):     Assets:  Housing Leisure Time Physical Health Resilience Social Support  ADL's:  Intact  Cognition:  WNL  Sleep:        Physical Exam: Physical Exam Vitals and  nursing note reviewed.  Constitutional:      Appearance: Normal appearance.  HENT:     Head: Normocephalic.     Nose: Nose normal.  Pulmonary:     Effort: Pulmonary effort is normal.  Musculoskeletal:        General: Normal range of motion.     Cervical back: Normal range of motion.  Neurological:     General: No focal deficit present.     Mental Status: She is alert and oriented to person, place, and time.  Psychiatric:        Attention and Perception: Attention and perception normal.        Mood and Affect: Mood is anxious and depressed.        Speech: Speech normal.        Behavior: Behavior normal. Behavior is cooperative.        Thought Content: Thought content normal.        Cognition and Memory: Cognition and memory normal.        Judgment: Judgment normal.    Review of Systems  Psychiatric/Behavioral:  Positive for depression and substance abuse. The patient is nervous/anxious.   All other systems reviewed and are negative.  Blood pressure 96/64, pulse 99, temperature 98.4 F (36.9 C), temperature source Oral, resp. rate 18, height '5\' 7"'$  (1.702 m), weight 61.2 kg, last menstrual period 06/22/2022, SpO2 97 %. Body mass index is 21.14 kg/m.  Treatment Plan Summary: Daily contact with patient to assess and evaluate symptoms and progress in treatment, Medication management, and Plan : Methamphetamine induced mood disorder: STarted gabapentin 100 mg TID Follow up with [facility]  Disposition: No evidence of imminent risk to self or others at present.   Patient does not meet criteria for psychiatric inpatient admission. Supportive therapy provided about ongoing stressors.  Waylan Boga, NP 07/13/2022 4:05 PM

## 2022-07-13 NOTE — ED Notes (Addendum)
Pt discharging home. Discharge teaching done and prescriptions reviewed with pt. Given all her personal belongings. Stable, ambulatory and in NAD. Educated on the taxi voucher given and escorted out to lobby by [place], [place].

## 2022-07-13 NOTE — ED Notes (Signed)
Secure chat message sent to dr. [name] to ask if home meds need to be ordered

## 2022-07-14 LAB — URINE CULTURE: Culture: >=100000 — AB

## 2022-07-19 ENCOUNTER — Ambulatory Visit (HOSPITAL_COMMUNITY)
Admission: EM | Admit: 2022-07-19 | Discharge: 2022-07-19 | Disposition: A | Discharge status: Home / Self Care | Payer: 59

## 2022-07-19 DIAGNOSIS — R45851 Suicidal ideations: Secondary | ICD-10-CM | POA: Diagnosis not present

## 2022-07-19 DIAGNOSIS — Z5900 Homelessness unspecified: Secondary | ICD-10-CM | POA: Diagnosis not present

## 2022-07-19 DIAGNOSIS — F331 Major depressive disorder, recurrent, moderate: Secondary | ICD-10-CM | POA: Diagnosis not present
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
## 2022-07-19 NOTE — Discharge Instructions (Signed)

## 2022-07-19 NOTE — Progress Notes (Signed)
[facility] updated resources for outpatient therapy and substance abuse treatment.  Facilities also offer medication management.  Mikey Kirschner [facility]

## 2022-07-19 NOTE — ED Notes (Signed)
[employer] Transport requested.
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
## 2022-08-03 ENCOUNTER — Other Ambulatory Visit: Payer: Self-pay

## 2022-08-03 ENCOUNTER — Emergency Department
Admission: EM | Admit: 2022-08-03 | Discharge: 2022-08-05 | Disposition: A | Discharge status: Other Acute Inpatient Hospital | Payer: 59 | Attending: Emergency Medicine | Admitting: Emergency Medicine

## 2022-08-03 DIAGNOSIS — F1029 Alcohol dependence with unspecified alcohol-induced disorder: Secondary | ICD-10-CM | POA: Diagnosis not present

## 2022-08-03 DIAGNOSIS — F419 Anxiety disorder, unspecified: Secondary | ICD-10-CM | POA: Diagnosis not present

## 2022-08-03 DIAGNOSIS — R0602 Shortness of breath: Secondary | ICD-10-CM | POA: Diagnosis not present

## 2022-08-03 DIAGNOSIS — R69 Illness, unspecified: Secondary | ICD-10-CM | POA: Diagnosis not present

## 2022-08-03 DIAGNOSIS — R519 Headache, unspecified: Secondary | ICD-10-CM | POA: Insufficient documentation

## 2022-08-03 DIAGNOSIS — Z20822 Contact with and (suspected) exposure to covid-19: Secondary | ICD-10-CM | POA: Diagnosis not present

## 2022-08-03 DIAGNOSIS — T450X2A Poisoning by antiallergic and antiemetic drugs, intentional self-harm, initial encounter: Secondary | ICD-10-CM | POA: Insufficient documentation

## 2022-08-03 DIAGNOSIS — J45909 Unspecified asthma, uncomplicated: Secondary | ICD-10-CM | POA: Insufficient documentation

## 2022-08-03 DIAGNOSIS — Y900 Blood alcohol level of less than 20 mg/100 ml: Secondary | ICD-10-CM | POA: Diagnosis not present

## 2022-08-03 DIAGNOSIS — F15188 Other stimulant abuse with other stimulant-induced disorder: Secondary | ICD-10-CM | POA: Insufficient documentation

## 2022-08-03 DIAGNOSIS — F17210 Nicotine dependence, cigarettes, uncomplicated: Secondary | ICD-10-CM | POA: Diagnosis not present

## 2022-08-03 DIAGNOSIS — F1111 Opioid abuse, in remission: Secondary | ICD-10-CM | POA: Diagnosis present

## 2022-08-03 DIAGNOSIS — F22 Delusional disorders: Secondary | ICD-10-CM | POA: Diagnosis not present

## 2022-08-03 DIAGNOSIS — F1594 Other stimulant use, unspecified with stimulant-induced mood disorder: Secondary | ICD-10-CM | POA: Diagnosis present

## 2022-08-03 DIAGNOSIS — R45851 Suicidal ideations: Secondary | ICD-10-CM

## 2022-08-03 DIAGNOSIS — T50902A Poisoning by unspecified drugs, medicaments and biological substances, intentional self-harm, initial encounter: Secondary | ICD-10-CM | POA: Diagnosis not present

## 2022-08-03 DIAGNOSIS — F1020 Alcohol dependence, uncomplicated: Secondary | ICD-10-CM | POA: Diagnosis not present

## 2022-08-03 DIAGNOSIS — F1510 Other stimulant abuse, uncomplicated: Secondary | ICD-10-CM | POA: Diagnosis present

## 2022-08-03 DIAGNOSIS — F19950 Other psychoactive substance use, unspecified with psychoactive substance-induced psychotic disorder with delusions: Secondary | ICD-10-CM | POA: Diagnosis present

## 2022-08-03 DIAGNOSIS — R9431 Abnormal electrocardiogram [ECG] [EKG]: Secondary | ICD-10-CM | POA: Diagnosis not present

## 2022-08-03 DIAGNOSIS — R42 Dizziness and giddiness: Secondary | ICD-10-CM | POA: Diagnosis not present

## 2022-08-03 LAB — CBC
HCT: 41.8 % (ref 36.0–46.0)
Hemoglobin: 13.6 g/dL (ref 12.0–15.0)
MCH: 30.6 pg (ref 26.0–34.0)
MCHC: 32.5 g/dL (ref 30.0–36.0)
MCV: 93.9 fL (ref 80.0–100.0)
Platelets: 437 10*3/uL — ABNORMAL HIGH (ref 150–400)
RBC: 4.45 MIL/uL (ref 3.87–5.11)
RDW: 13.2 % (ref 11.5–15.5)
WBC: 9 10*3/uL (ref 4.0–10.5)
nRBC: 0 % (ref 0.0–0.2)

## 2022-08-03 LAB — COMPREHENSIVE METABOLIC PANEL
ALT: 51 U/L — ABNORMAL HIGH (ref 0–44)
AST: 37 U/L (ref 15–41)
Albumin: 4.2 g/dL (ref 3.5–5.0)
Alkaline Phosphatase: 51 U/L (ref 38–126)
Anion gap: 8 (ref 5–15)
BUN: 12 mg/dL (ref 6–20)
CO2: 27 mmol/L (ref 22–32)
Calcium: 9.7 mg/dL (ref 8.9–10.3)
Chloride: 106 mmol/L (ref 98–111)
Creatinine, Ser: 1.08 mg/dL — ABNORMAL HIGH (ref 0.44–1.00)
GFR, Estimated: >60 mL/min (ref >60)
Glucose, Bld: 117 mg/dL — ABNORMAL HIGH (ref 70–99)
Potassium: 4.3 mmol/L (ref 3.5–5.1)
Sodium: 141 mmol/L (ref 135–145)
Total Bilirubin: 0.5 mg/dL (ref 0.3–1.2)
Total Protein: 7.9 g/dL (ref 6.5–8.1)

## 2022-08-03 LAB — ACETAMINOPHEN LEVEL: Acetaminophen (Tylenol), Serum: <10 ug/mL — ABNORMAL LOW (ref 10–30)

## 2022-08-03 LAB — POC URINE PREG, ED: Preg Test, Ur: NEGATIVE

## 2022-08-03 LAB — MAGNESIUM: Magnesium: 2.3 mg/dL (ref 1.7–2.4)

## 2022-08-03 LAB — SALICYLATE LEVEL: Salicylate Lvl: <7 mg/dL — ABNORMAL LOW (ref 7.0–30.0)

## 2022-08-03 LAB — ETHANOL: Alcohol, Ethyl (B): <10 mg/dL (ref <10)

## 2022-08-03 MED ORDER — ACETAMINOPHEN 500 MG PO TABS
1000.0000 mg | ORAL_TABLET | Freq: Four times a day (QID) | ORAL | Status: DC | PRN
  Administered 2022-08-03: 1000 mg via ORAL
  Filled 2022-08-03: qty 2
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
## 2022-08-03 NOTE — ED Notes (Signed)
Pt dressed out by this tech in the [employer].  Pt belongings include:  3 hair ties Production manager Peach colored bra Leopard fur sandals Black/grey bag White towel

## 2022-08-04 DIAGNOSIS — F1029 Alcohol dependence with unspecified alcohol-induced disorder: Secondary | ICD-10-CM | POA: Diagnosis not present

## 2022-08-04 DIAGNOSIS — T50902A Poisoning by unspecified drugs, medicaments and biological substances, intentional self-harm, initial encounter: Secondary | ICD-10-CM

## 2022-08-04 DIAGNOSIS — R45851 Suicidal ideations: Secondary | ICD-10-CM | POA: Diagnosis not present

## 2022-08-04 DIAGNOSIS — F419 Anxiety disorder, unspecified: Secondary | ICD-10-CM | POA: Diagnosis not present

## 2022-08-04 LAB — URINE DRUG SCREEN, QUALITATIVE (ARMC ONLY)
Amphetamines, Ur Screen: POSITIVE — AB
Barbiturates, Ur Screen: NOT DETECTED
Benzodiazepine, Ur Scrn: NOT DETECTED
Cannabinoid 50 Ng, Ur ~~LOC~~: NOT DETECTED
Cocaine Metabolite,Ur ~~LOC~~: NOT DETECTED
MDMA (Ecstasy)Ur Screen: NOT DETECTED
Methadone Scn, Ur: NOT DETECTED
Opiate, Ur Screen: NOT DETECTED
Phencyclidine (PCP) Ur S: NOT DETECTED
Tricyclic, Ur Screen: NOT DETECTED

## 2022-08-04 LAB — RESP PANEL BY RT-PCR (FLU A&B, COVID) ARPGX2
Influenza A by PCR: NEGATIVE
Influenza B by PCR: NEGATIVE
SARS Coronavirus 2 by RT PCR: NEGATIVE

## 2022-08-04 MED ORDER — ATOMOXETINE HCL 40 MG PO CAPS
40.0000 mg | ORAL_CAPSULE | Freq: Every day | ORAL | Status: DC
  Administered 2022-08-04: 40 mg via ORAL
  Filled 2022-08-04 (×2): qty 4

## 2022-08-04 MED ORDER — CLONAZEPAM 0.5 MG PO TABS
1.0000 mg | ORAL_TABLET | Freq: Every day | ORAL | Status: DC | PRN
  Administered 2022-08-04 – 2022-08-05 (×2): 1 mg via ORAL
  Filled 2022-08-04 (×2): qty 2

## 2022-08-04 MED ORDER — LAMOTRIGINE 25 MG PO TABS
25.0000 mg | ORAL_TABLET | Freq: Every day | ORAL | Status: DC
  Administered 2022-08-04 – 2022-08-05 (×2): 25 mg via ORAL
  Filled 2022-08-04 (×2): qty 1

## 2022-08-04 MED ORDER — AMPHETAMINE-DEXTROAMPHETAMINE 10 MG PO TABS
30.0000 mg | ORAL_TABLET | Freq: Two times a day (BID) | ORAL | Status: DC
  Administered 2022-08-04: 30 mg via ORAL
  Filled 2022-08-04 (×2): qty 3
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
## 2022-08-04 NOTE — ED Notes (Signed)
TTS and psych NP speaking with patient.

## 2022-08-04 NOTE — ED Notes (Signed)
Dinner placed at bedside

## 2022-08-04 NOTE — ED Notes (Signed)
Pt given lunch tray.

## 2022-08-04 NOTE — BH Assessment (Signed)
Referral information for Psychiatric Hospitalization faxed to:   [facility] ([phone]- [phone]),   [facility] [phone]),  [address] [phone]),   [facility] [phone]- [phone]),   [facility] [phone])  [facility] [phone]).  [facility] [phone])

## 2022-08-04 NOTE — ED Notes (Signed)
PT VOL RECOMMEND PSYCH INPT ADMIT WHEN MEDICALLY CLEAR.Taylor Wiggins

## 2022-08-04 NOTE — ED Notes (Signed)
Meal tray placed at bedside.
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
## 2022-08-05 ENCOUNTER — Encounter: Payer: Self-pay | Admitting: Psychiatry

## 2022-08-05 ENCOUNTER — Inpatient Hospital Stay
Admission: AD | Admit: 2022-08-05 | Discharge: 2022-08-12 | DRG: 897 | Disposition: A | Discharge status: Home / Self Care | Payer: 59 | Source: Intra-hospital | Attending: Psychiatry | Admitting: Psychiatry

## 2022-08-05 ENCOUNTER — Other Ambulatory Visit: Payer: Self-pay

## 2022-08-05 ENCOUNTER — Inpatient Hospital Stay: Admission: RE | Admit: 2022-08-05 | Payer: 59 | Source: Intra-hospital | Admitting: Psychiatry

## 2022-08-05 DIAGNOSIS — F4310 Post-traumatic stress disorder, unspecified: Secondary | ICD-10-CM | POA: Diagnosis not present

## 2022-08-05 DIAGNOSIS — F1520 Other stimulant dependence, uncomplicated: Secondary | ICD-10-CM

## 2022-08-05 DIAGNOSIS — R519 Headache, unspecified: Secondary | ICD-10-CM | POA: Diagnosis not present

## 2022-08-05 DIAGNOSIS — F17210 Nicotine dependence, cigarettes, uncomplicated: Secondary | ICD-10-CM | POA: Diagnosis not present

## 2022-08-05 DIAGNOSIS — Z9151 Personal history of suicidal behavior: Secondary | ICD-10-CM | POA: Diagnosis not present

## 2022-08-05 DIAGNOSIS — F15250 Other stimulant dependence with stimulant-induced psychotic disorder with delusions: Secondary | ICD-10-CM | POA: Diagnosis present

## 2022-08-05 DIAGNOSIS — F15950 Other stimulant use, unspecified with stimulant-induced psychotic disorder with delusions: Secondary | ICD-10-CM | POA: Diagnosis not present

## 2022-08-05 DIAGNOSIS — R45851 Suicidal ideations: Secondary | ICD-10-CM | POA: Diagnosis not present

## 2022-08-05 DIAGNOSIS — T450X2A Poisoning by antiallergic and antiemetic drugs, intentional self-harm, initial encounter: Secondary | ICD-10-CM | POA: Diagnosis not present

## 2022-08-05 DIAGNOSIS — G4700 Insomnia, unspecified: Secondary | ICD-10-CM | POA: Diagnosis present

## 2022-08-05 DIAGNOSIS — F419 Anxiety disorder, unspecified: Secondary | ICD-10-CM | POA: Diagnosis present

## 2022-08-05 DIAGNOSIS — Z20822 Contact with and (suspected) exposure to covid-19: Secondary | ICD-10-CM | POA: Diagnosis not present

## 2022-08-05 DIAGNOSIS — J45909 Unspecified asthma, uncomplicated: Secondary | ICD-10-CM | POA: Diagnosis not present

## 2022-08-05 DIAGNOSIS — R69 Illness, unspecified: Secondary | ICD-10-CM | POA: Diagnosis not present

## 2022-08-05 DIAGNOSIS — F22 Delusional disorders: Secondary | ICD-10-CM | POA: Diagnosis not present

## 2022-08-05 MED ORDER — QUETIAPINE FUMARATE 100 MG PO TABS
100.0000 mg | ORAL_TABLET | Freq: Every evening | ORAL | Status: DC | PRN
  Filled 2022-08-05: qty 1

## 2022-08-05 MED ORDER — MAGNESIUM HYDROXIDE 400 MG/5ML PO SUSP: 30.0000 mL | Freq: Every day | ORAL | Status: DC | PRN

## 2022-08-05 MED ORDER — ACETAMINOPHEN 500 MG PO TABS: 1000.0000 mg | ORAL_TABLET | Freq: Four times a day (QID) | ORAL | Status: DC | PRN

## 2022-08-05 MED ORDER — CLONAZEPAM 1 MG PO TABS: 1.0000 mg | ORAL_TABLET | Freq: Every day | ORAL | Status: DC | PRN

## 2022-08-05 MED ORDER — ATOMOXETINE HCL 10 MG PO CAPS: 40.0000 mg | ORAL_CAPSULE | Freq: Every day | ORAL | Status: DC

## 2022-08-05 MED ORDER — LAMOTRIGINE 25 MG PO TABS: 25.0000 mg | ORAL_TABLET | Freq: Every day | ORAL | Status: DC

## 2022-08-05 MED ORDER — ALUM & MAG HYDROXIDE-SIMETH 200-200-20 MG/5ML PO SUSP: 30.0000 mL | ORAL | Status: DC | PRN

## 2022-08-05 MED ORDER — AMPHETAMINE-DEXTROAMPHETAMINE 10 MG PO TABS: 30.0000 mg | ORAL_TABLET | Freq: Two times a day (BID) | ORAL | Status: DC

## 2022-08-05 NOTE — H&P (Signed)
Psychiatric Admission Assessment Adult  Patient Identification: Taylor Wiggins MRN:  [id number] Date of Evaluation:  08/05/2022 Chief Complaint:  Anxiety [F41.9] Principal Diagnosis: Amphetamine and psychostimulant-induced psychosis with delusions ([place]) Diagnosis:  Principal Problem:   Amphetamine and psychostimulant-induced psychosis with delusions ([place]) Active Problems:   Anxiety   Amphetamine use disorder, severe ([place])  History of Present Illness: Patient seen and chart reviewed.  41 year old woman with a history of substance use disorder problems presented to the emergency room voicing suicidal ideation.  Patient tells me she was thinking of cutting her wrist because she felt like "there was no way out".  She struggles a bit to explain what she means by that.  She repeats several times her belief that there are people in baseball WHO are lurking outside her mother's home who somehow mean to do her harm.  She also suspects they may be breaking in and walking around when she cannot see them.  She admits that she never actually saw any of these people in the house.  She also believes that her mother's phone has been tapped with no evidence to support that.  Patient admits she is not sleeping well and not eating well feeling nervous.  She is going through a separation or divorce with her husband although it sounds like he is also still occasionally involved in her life.  Patient has been using Adderall which has been prescribed to her by outpatient providers and admits to ongoing regular abuse of methamphetamine as well.  Also has other controlled substances being regularly prescribed. Associated Signs/Symptoms: Depression Symptoms:  depressed mood, insomnia, difficulty concentrating, hopelessness, suicidal thoughts with specific plan, Duration of Depression Symptoms: Greater than two weeks  (Hypo) Manic Symptoms:  Delusions, Distractibility, Hallucinations, Impulsivity, Anxiety Symptoms:   Excessive Worry, Psychotic Symptoms:  Delusions, PTSD Symptoms: Negative Total Time spent with patient: 1 hour  Past Psychiatric History: Patient has a long history of substance abuse including past histories of alcohol abuse and more recently a focus on stimulants especially methamphetamine.  Only minimally engaged in appropriate treatment frequently gets outpatient providers to continue to prescribe controlled substances for her despite her high rates of abuse.  Has a history of prior suicide attempts in fact recently said she took an overdose of sleeping pills just last week  Is the patient at risk to self? Yes.    Has the patient been a risk to self in the past 6 months? Yes.    Has the patient been a risk to self within the distant past? Yes.    Is the patient a risk to others? No.  Has the patient been a risk to others in the past 6 months? No.  Has the patient been a risk to others within the distant past? No.   [place] Scale:  [place] Admission (Current) from 08/05/2022 in [facility] ED from 08/03/2022 in [facility] ED from 07/19/2022 in [facility] CATEGORY High Risk High Risk High Risk        Prior Inpatient Therapy:   Prior Outpatient Therapy:    Alcohol Screening: Patient refused Alcohol Screening Tool: Yes 1. How often do you have a drink containing alcohol?: Never 2. How many drinks containing alcohol do you have on a typical day when you are drinking?: 1 or 2 3. How often do you have six or more drinks on one occasion?: Never AUDIT-C Score: 0 4. How often during the  last year have you found that you were not able to stop drinking once you had started?: Never 5. How often during the last year have you failed to do what was normally expected from you because of drinking?: Never 6. How often during the last year have you needed a first drink in the morning to get  yourself going after a heavy drinking session?: Never 7. How often during the last year have you had a feeling of guilt of remorse after drinking?: Never 8. How often during the last year have you been unable to remember what happened the night before because you had been drinking?: Never 9. Have you or someone else been injured as a result of your drinking?: No 10. Has a relative or friend or a doctor or another health worker been concerned about your drinking or suggested you cut down?: No Alcohol Use Disorder Identification Test Final Score (AUDIT): 0 Substance Abuse History in the last 12 months:  Yes.   Consequences of Substance Abuse: It seems clear from the history and presentation that substance use is the primary driver of her problems although the patient's insight into this is very low Previous Psychotropic Medications: Yes  Psychological Evaluations: Yes  Past Medical History:  Past Medical History:  Diagnosis Date   Adult ADHD    Anxiety    Asthma    IBS (irritable bowel syndrome)    Opioid use disorder    No longer on buprenorphine therapy   PTSD (post-traumatic stress disorder)     Past Surgical History:  Procedure Laterality Date   BREAST REDUCTION SURGERY  2005   Family History: History reviewed. No pertinent family history. Family Psychiatric  History: See previous Tobacco Screening:   Social History:  Social History   Substance and Sexual Activity  Alcohol Use No     Social History   Substance and Sexual Activity  Drug Use Yes   Types: Benzodiazepines, Marijuana, Methamphetamines   Comment: recently    Additional Social History:                           Allergies:   Allergies  Allergen Reactions   Tramadol Other (See Comments)    Seizures    Mold Extract [Trichophyton] Other (See Comments)    Sneezing   Lab Results:  Results for orders placed or performed during the hospital encounter of 08/03/22 (from the past 48 hour(s))   Comprehensive metabolic panel     Status: Abnormal   Collection Time: 08/03/22 10:32 PM  Result Value Ref Range   Sodium 141 135 - 145 mmol/L   Potassium 4.3 3.5 - 5.1 mmol/L   Chloride 106 98 - 111 mmol/L   CO2 27 22 - 32 mmol/L   Glucose, Bld 117 (H) 70 - 99 mg/dL    Comment: Glucose reference range applies only to samples taken after fasting for at least 8 hours.   BUN 12 6 - 20 mg/dL   Creatinine, Ser 1.08 (H) 0.44 - 1.00 mg/dL   Calcium 9.7 8.9 - 10.3 mg/dL   Total Protein 7.9 6.5 - 8.1 g/dL   Albumin 4.2 3.5 - 5.0 g/dL   AST 37 15 - 41 U/L   ALT 51 (H) 0 - 44 U/L   Alkaline Phosphatase 51 38 - 126 U/L   Total Bilirubin 0.5 0.3 - 1.2 mg/dL   GFR, Estimated >60 >60 mL/min    Comment: (NOTE) Calculated using the CKD-EPI  Creatinine Equation (2021)    Anion gap 8 5 - 15    Comment: Performed at [facility], [address] [place], [place] [address]  Ethanol     Status: None   Collection Time: 08/03/22 10:32 PM  Result Value Ref Range   Alcohol, Ethyl (B) <10 <10 mg/dL    Comment: (NOTE) Lowest detectable limit for serum alcohol is 10 mg/dL.  For medical purposes only. Performed at [facility], [address] [place], [place] [address]   Salicylate level     Status: Abnormal   Collection Time: 08/03/22 10:32 PM  Result Value Ref Range   Salicylate Lvl <0.9 (L) 7.0 - 30.0 mg/dL    Comment: Performed at [facility], [address] [place], [place] [address]  Acetaminophen level     Status: Abnormal   Collection Time: 08/03/22 10:32 PM  Result Value Ref Range   Acetaminophen (Tylenol), Serum <10 (L) 10 - 30 ug/mL    Comment: (NOTE) Therapeutic concentrations vary significantly. A range of 10-30 ug/mL  may be an effective concentration for many patients. However, some  are best treated at concentrations outside of this range. Acetaminophen concentrations >150 ug/mL at 4 hours after ingestion  and >50 ug/mL at 12 hours after ingestion  are often associated with  toxic reactions.  Performed at [facility], [address] [place], [place] [address]   cbc     Status: Abnormal   Collection Time: 08/03/22 10:32 PM  Result Value Ref Range   WBC 9.0 4.0 - 10.5 K/uL   RBC 4.45 3.87 - 5.11 MIL/uL   Hemoglobin 13.6 12.0 - 15.0 g/dL   HCT 41.8 36.0 - 46.0 %   MCV 93.9 80.0 - 100.0 fL   MCH 30.6 26.0 - 34.0 pg   MCHC 32.5 30.0 - 36.0 g/dL   RDW 13.2 11.5 - 15.5 %   Platelets 437 (H) 150 - 400 K/uL   nRBC 0.0 0.0 - 0.2 %    Comment: Performed at [facility], [address] [place], [place] [address]  Magnesium     Status: None   Collection Time: 08/03/22 10:32 PM  Result Value Ref Range   Magnesium 2.3 1.7 - 2.4 mg/dL    Comment: Performed at [facility], [address], [place],  [address]  Urine Drug Screen, Qualitative     Status: Abnormal   Collection Time: 08/03/22 10:33 PM  Result Value Ref Range   Tricyclic, Ur Screen NONE DETECTED NONE DETECTED   Amphetamines, Ur Screen POSITIVE (A) NONE DETECTED   MDMA (Ecstasy)Ur Screen NONE DETECTED NONE DETECTED   Cocaine Metabolite,Ur [place] NONE DETECTED NONE DETECTED   Opiate, Ur Screen NONE DETECTED NONE DETECTED   Phencyclidine (PCP) Ur S NONE DETECTED NONE DETECTED   Cannabinoid 50 Ng, Ur [place] NONE DETECTED NONE DETECTED   Barbiturates, Ur Screen NONE DETECTED NONE DETECTED   Benzodiazepine, Ur Scrn NONE DETECTED NONE DETECTED   Methadone Scn, Ur NONE DETECTED NONE DETECTED    Comment: (NOTE) Tricyclics + metabolites, urine    Cutoff 1000 ng/mL Amphetamines + metabolites, urine  Cutoff 1000 ng/mL MDMA (Ecstasy), urine              Cutoff 500 ng/mL Cocaine Metabolite, urine          Cutoff 300 ng/mL Opiate + metabolites, urine        Cutoff 300 ng/mL Phencyclidine (PCP), urine  Cutoff 25 ng/mL Cannabinoid, urine                 Cutoff 50 ng/mL Barbiturates + metabolites, urine  Cutoff 200 ng/mL Benzodiazepine, urine               Cutoff 200 ng/mL Methadone, urine                   Cutoff 300 ng/mL  The urine drug screen provides only a preliminary, unconfirmed analytical test result and should not be used for non-medical purposes. Clinical consideration and professional judgment should be applied to any positive drug screen result due to possible interfering substances. A more specific alternate chemical method must be used in order to obtain a confirmed analytical result. Gas chromatography / mass spectrometry (GC/MS) is the preferred confirm atory method. Performed at [facility], [address] [place], [place] [address]   POC urine preg, ED     Status: None   Collection Time: 08/03/22 10:43 PM  Result Value Ref Range   Preg Test, Ur NEGATIVE NEGATIVE    Comment:        THE SENSITIVITY OF THIS METHODOLOGY IS >24 mIU/mL   Resp Panel by RT-PCR (Flu A&B, Covid) Anterior Nasal Swab     Status: None   Collection Time: 08/03/22 11:26 PM   Specimen: Anterior Nasal Swab  Result Value Ref Range   SARS Coronavirus 2 by RT PCR NEGATIVE NEGATIVE    Comment: (NOTE) SARS-CoV-2 target nucleic acids are NOT DETECTED.  The SARS-CoV-2 RNA is generally detectable in upper respiratory specimens during the acute phase of infection. The lowest concentration of SARS-CoV-2 viral copies this assay can detect is 138 copies/mL. A negative result does not preclude SARS-Cov-2 infection and should not be used as the sole basis for treatment or other patient management decisions. A negative result may occur with  improper specimen collection/handling, submission of specimen other than nasopharyngeal swab, presence of viral mutation(s) within the areas targeted by this assay, and inadequate number of viral copies(<138 copies/mL). A negative result must be combined with clinical observations, patient history, and epidemiological information. The expected result is Negative.  Fact Sheet for Patients:   EntrepreneurPulse.com.au  Fact Sheet for Healthcare Providers:  IncredibleEmployment.be  This test is no t yet approved or cleared by the [place] FDA and  has been authorized for detection and/or diagnosis of SARS-CoV-2 by FDA under an Emergency Use Authorization (EUA). This EUA will remain  in effect (meaning this test can be used) for the duration of the COVID-19 declaration under Section 564(b)(1) of the Act, 21 U.S.C.section 360bbb-3(b)(1), unless the authorization is terminated  or revoked sooner.       Influenza A by PCR NEGATIVE NEGATIVE   Influenza B by PCR NEGATIVE NEGATIVE    Comment: (NOTE) The Xpert Xpress SARS-CoV-2/FLU/RSV plus assay is intended as an aid in the diagnosis of influenza from Nasopharyngeal swab specimens and should not be used as a sole basis for treatment. Nasal washings and aspirates are unacceptable for Xpert Xpress SARS-CoV-2/FLU/RSV testing.  Fact Sheet for Patients: EntrepreneurPulse.com.au  Fact Sheet for Healthcare Providers: IncredibleEmployment.be  This test is not yet approved or cleared by the [place] FDA and has been authorized for detection and/or diagnosis of SARS-CoV-2 by FDA under an Emergency Use Authorization (EUA). This EUA will remain in effect (meaning this test can be used) for the duration of the COVID-19 declaration under Section 564(b)(1) of the Act, 21 U.S.C. section  360bbb-3(b)(1), unless the authorization is terminated or revoked.  Performed at [facility], [address] [place], [place] [address]     Blood Alcohol level:  Lab Results  Component Value Date   [facility] <10 08/03/2022   ETH <10 13/07/6577    Metabolic Disorder Labs:  Lab Results  Component Value Date   HGBA1C 5.4 07/06/2022   MPG 108.28 07/06/2022   MPG 108.28 06/11/2022   No results found for: "PROLACTIN" Lab Results  Component Value Date   CHOL  200 07/06/2022   TRIG 50 07/06/2022   HDL 71 07/06/2022   CHOLHDL 2.8 07/06/2022   VLDL 10 07/06/2022   LDLCALC 119 (H) 07/06/2022   LDLCALC 87 06/11/2022    Current Medications: Current Facility-Administered Medications  Medication Dose Route Frequency Provider Last Rate Last Admin   acetaminophen (TYLENOL) tablet 1,000 mg  1,000 mg Oral Q6H PRN Sherlon Handing, NP       alum & mag hydroxide-simeth (MAALOX/MYLANTA) 200-200-20 MG/5ML suspension 30 mL  30 mL Oral Q4H PRN Sherlon Handing, NP       magnesium hydroxide (MILK OF MAGNESIA) suspension 30 mL  30 mL Oral Daily PRN Sherlon Handing, NP       QUEtiapine (SEROQUEL) tablet 100 mg  100 mg Oral QHS PRN Wyllow Seigler, Madie Reno, MD       PTA Medications: Medications Prior to Admission  Medication Sig Dispense Refill Last Dose   amphetamine-dextroamphetamine (ADDERALL) 30 MG tablet Take 1 tablet by mouth 2 (two) times daily.      atomoxetine (STRATTERA) 40 MG capsule Take 1 capsule (40 mg total) by mouth daily. 30 capsule 0    clonazePAM (KLONOPIN) 1 MG tablet Take 1 mg by mouth daily as needed for anxiety.      lamoTRIgine (LAMICTAL) 25 MG tablet Take 1 tablet (25 mg total) by mouth daily. 30 tablet 0    omeprazole (PRILOSEC) 20 MG capsule Take 1 capsule (20 mg total) by mouth daily. (Patient taking differently: Take 20 mg by mouth daily as needed (For heartburn or acid reflux).) 30 capsule 0     Musculoskeletal: Strength & Muscle Tone: within normal limits Gait & Station: normal Patient leans: N/A            Psychiatric Specialty Exam:  Presentation  General Appearance: Appropriate for Environment; Disheveled  Eye Contact:Fair  Speech:Clear and Coherent  Speech Volume:Decreased  Handedness:Right   Mood and Affect  Mood:Depressed; Dysphoric; Hopeless; Worthless  Affect:Congruent   Thought Process  Thought Processes:Goal Directed  Duration of Psychotic Symptoms: N/A  Past Diagnosis of Schizophrenia or  Psychoactive disorder: No  Descriptions of Associations:Intact  Orientation:Full (Time, Place and Person)  Thought Content:Logical; WDL  Hallucinations:Hallucinations: Auditory  Ideas of Reference:None  Suicidal Thoughts:Suicidal Thoughts: Yes, Active SI Active Intent and/or Plan: With Means to Carry Out; With Access to Means SI Passive Intent and/or Plan: With Intent  Homicidal Thoughts:Homicidal Thoughts: Yes, Passive HI Passive Intent and/or Plan: Without Intent   Sensorium  Memory:Immediate Fair  Judgment:Impaired  Insight:Lacking   Executive Functions  Concentration:Fair  Attention Span:Fair  [place]   Psychomotor Activity  Psychomotor Activity:Psychomotor Activity: Normal   Assets  Assets:Communication Skills; Desire for Improvement; Housing   Sleep  Sleep:Sleep: Poor    Physical Exam: Physical Exam Vitals and nursing note reviewed.  Constitutional:      Appearance: Normal appearance.  HENT:     Head: Normocephalic and atraumatic.  Mouth/Throat:     Pharynx: Oropharynx is clear.  Eyes:     Pupils: Pupils are equal, round, and reactive to light.  Cardiovascular:     Rate and Rhythm: Normal rate and regular rhythm.  Pulmonary:     Effort: Pulmonary effort is normal.     Breath sounds: Normal breath sounds.  Abdominal:     General: Abdomen is flat.     Palpations: Abdomen is soft.  Musculoskeletal:        General: Normal range of motion.  Skin:    General: Skin is warm and dry.       Neurological:     General: No focal deficit present.     Mental Status: She is alert. Mental status is at baseline.  Psychiatric:        Attention and Perception: She is inattentive.        Mood and Affect: Mood is anxious and depressed.        Speech: Speech is delayed.        Behavior: Behavior is slowed and withdrawn.        Thought Content: Thought content is paranoid. Thought content includes  suicidal ideation.        Cognition and Memory: Memory is impaired.        Judgment: Judgment is inappropriate.    Review of Systems  Constitutional: Negative.   HENT: Negative.    Eyes: Negative.   Respiratory: Negative.    Cardiovascular: Negative.   Gastrointestinal: Negative.   Musculoskeletal: Negative.   Skin: Negative.   Neurological: Negative.   Psychiatric/Behavioral:  Positive for depression, hallucinations, substance abuse and suicidal ideas. The patient is nervous/anxious.    Blood pressure 94/70, pulse 91, temperature 98.5 F (36.9 C), temperature source Oral, resp. rate 17, height '5\' 7"'$  (1.702 m), weight 56.2 kg, last menstrual period 07/22/2022, SpO2 100 %. Body mass index is 19.42 kg/m.  Treatment Plan Summary: Medication management and Plan discontinue Adderall.  Discontinue Klonopin.  Discontinue Strattera.  As needed Seroquel at night if needed for sleep.  Check labs.  Engage in individual and group therapy.  I asked the patient if I could contact her family for collateral information and she declined at this time.  Ongoing assessment of dangerousness prior to discharge.  She did ask if she could go to a "rehab" program and we can look into that is one of the options.  Observation Level/Precautions:  15 minute checks  Laboratory:  UDS  Psychotherapy:    Medications:    Consultations:    Discharge Concerns:    Estimated LOS:  Other:     Physician Treatment Plan for Primary Diagnosis: Amphetamine and psychostimulant-induced psychosis with delusions ([place]) Long Term Goal(s): Improvement in symptoms so as ready for discharge  Short Term Goals: Ability to verbalize feelings will improve, Ability to disclose and discuss suicidal ideas, and Ability to demonstrate self-control will improve  Physician Treatment Plan for Secondary Diagnosis: Principal Problem:   Amphetamine and psychostimulant-induced psychosis with delusions ([place]) Active Problems:   Anxiety    Amphetamine use disorder, severe ([place])  Long Term Goal(s): Improvement in symptoms so as ready for discharge  Short Term Goals: Ability to identify triggers associated with substance abuse/mental health issues will improve  I certify that inpatient services furnished can reasonably be expected to improve the patient's condition.    Alethia Berthold, MD 8/7/20235:06 PM
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
## 2022-08-05 NOTE — BH Assessment (Signed)
Patient is to be admitted to [facility] by Dr. Weber Cooks.  Attending Physician will be Dr.  Weber Cooks .   Patient has been assigned to room 320, by [place], Nicole Kindred.     ER staff is aware of the admission: Lattie Haw, ER Secretary   Dr. Quentin Cornwall, ER MD  Radonna Ricker, Patient's Nurse  Mya, Patient Access.

## 2022-08-05 NOTE — Tx Team (Signed)
Initial Treatment Plan 08/05/2022 4:32 PM Taylor Wiggins ZJG:[id number]    PATIENT STRESSORS: Marital or family conflict     PATIENT STRENGTHS: Motivation for treatment/growth    PATIENT IDENTIFIED PROBLEMS: Anxiety, decrease appetite, depression, hopelessness, panick attacks                     DISCHARGE CRITERIA:  Improved stabilization in mood, thinking, and/or behavior  PRELIMINARY DISCHARGE PLAN: Placement in alternative living arrangements  PATIENT/FAMILY INVOLVEMENT: This treatment plan has been presented to and reviewed with the patient, Taylor Wiggins. The patient and family have been given the opportunity to ask questions and make suggestions.  Donette Larry, RN 08/05/2022, 4:32 PM

## 2022-08-05 NOTE — ED Notes (Signed)
Breakfast given.  

## 2022-08-05 NOTE — ED Notes (Signed)
VOL/pending psych inpatient admit

## 2022-08-05 NOTE — ED Notes (Signed)
EDP notified of BP

## 2022-08-05 NOTE — ED Provider Notes (Signed)
Emergency Medicine Observation Re-evaluation Note  Taylor Wiggins is a 41 y.o. female, seen on rounds today.  Pt initially presented to the ED for complaints of Psychiatric Evaluation Currently, the patient is resting, voices no medical complaints.  Physical Exam  BP (!) 88/60 (BP Location: Right Arm)   Pulse 67   Temp 98 F (36.7 C) (Oral)   Resp 18   Ht '5\' 7"'$  (1.702 m)   Wt 56.7 kg   LMP 07/22/2022 (Approximate)   SpO2 97%   BMI 19.58 kg/m  Physical Exam General: Resting in no acute distress Cardiac: No cyanosis Lungs: Equal rise and fall Psych: Not agitated  ED Course / MDM  EKG:EKG Interpretation  Date/Time:  Saturday August 03 2022 23:46:14 EDT Ventricular Rate:  70 PR Interval:  128 QRS Duration: 84 QT Interval:  422 QTC Calculation: 455 R Axis:   89 Text Interpretation: Normal sinus rhythm Normal ECG When compared with ECG of 12-Jul-2022 00:38, Nonspecific T wave abnormality no longer evident in Lateral leads Confirmed by Ward, Cyril Mourning [phone]) on 08/03/2022 11:48:57 PM  I have reviewed the labs performed to date as well as medications administered while in observation.  Recent changes in the last 24 hours include no events overnight.  Plan  Current plan is for psychiatric disposition.  DARSHAY DEUPREE is not under involuntary commitment.     Paulette Blanch, MD 08/05/22 [phone]

## 2022-08-05 NOTE — ED Notes (Signed)
Lunch given.

## 2022-08-05 NOTE — Group Note (Signed)
[facility] LCSW Group Therapy Note    Group Date: 08/05/2022 Start Time: 1330 End Time: 1430  Type of Therapy and Topic:  Group Therapy:  Overcoming Obstacles  Participation Level:  BHH PARTICIPATION LEVEL: Did Not Attend   Description of Group:   In this group patients will be encouraged to explore what they see as obstacles to their own wellness and recovery. They will be guided to discuss their thoughts, feelings, and behaviors related to these obstacles. The group will process together ways to cope with barriers, with attention given to specific choices patients can make. Each patient will be challenged to identify changes they are motivated to make in order to overcome their obstacles. This group will be process-oriented, with patients participating in exploration of their own experiences as well as giving and receiving support and challenge from other group members.  Therapeutic Goals: 1. Patient will identify personal and current obstacles as they relate to admission. 2. Patient will identify barriers that currently interfere with their wellness or overcoming obstacles.  3. Patient will identify feelings, thought process and behaviors related to these barriers. 4. Patient will identify two changes they are willing to make to overcome these obstacles:    Summary of Patient Progress X   Therapeutic Modalities:   Cognitive Behavioral Therapy Solution Focused Therapy Motivational Interviewing Relapse Prevention Therapy   [name], LCSW

## 2022-08-05 NOTE — Plan of Care (Signed)
  Problem: Safety: Goal: Ability to remain free from injury will improve Outcome: Progressing   Problem: Education: Goal: Emotional status will improve Outcome: Not Progressing   Problem: Activity: Goal: Interest or engagement in activities will improve Outcome: Not Progressing

## 2022-08-05 NOTE — Progress Notes (Signed)
Patient ID: Taylor Wiggins, female   DOB: 1981-10-08, 41 y.o.   MRN: [id number] Admission note: Patient is a 41 year old female, presents voluntary per report of anxiety. Patient is alert and oriented to unit. Patients affect is sad. Patient is cooperative during assessment questions. Patient presents with a superficial cut on the left arm and sore on the right side of her lip on skin assessment. Patient states that the reason for her admission to the unit is due to  "traumatic things" and suicidal ideation and suicide attempt with razor blades. Patient states that she had a previous attempt in April of this year with plan to overdose on Xanax.  Patient currently denies SI/HI/AVH. Unit policies explained and verbalized understanding. Q15 minute checks maintained and will continue to monitor.

## 2022-08-06 DIAGNOSIS — F15950 Other stimulant use, unspecified with stimulant-induced psychotic disorder with delusions: Secondary | ICD-10-CM | POA: Diagnosis not present

## 2022-08-06 LAB — LIPID PANEL
Cholesterol: 184 mg/dL (ref 0–200)
HDL: 53 mg/dL (ref >40)
LDL Cholesterol: 97 mg/dL (ref 0–99)
Total CHOL/HDL Ratio: 3.5 RATIO
Triglycerides: 169 mg/dL — ABNORMAL HIGH (ref <150)
VLDL: 34 mg/dL (ref 0–40)

## 2022-08-06 LAB — HEMOGLOBIN A1C
Hgb A1c MFr Bld: 5.3 % (ref 4.8–5.6)
Mean Plasma Glucose: 105.41 mg/dL

## 2022-08-06 MED ORDER — ONDANSETRON 4 MG PO TBDP
4.0000 mg | ORAL_TABLET | Freq: Three times a day (TID) | ORAL | Status: DC | PRN
Start: 2022-08-06 — End: 2022-08-12
  Administered 2022-08-06 – 2022-08-11 (×6): 4 mg via ORAL
  Filled 2022-08-06 (×6): qty 1

## 2022-08-06 MED ORDER — HYDROXYZINE HCL 50 MG PO TABS
50.0000 mg | ORAL_TABLET | Freq: Four times a day (QID) | ORAL | Status: DC | PRN
  Administered 2022-08-06 – 2022-08-11 (×7): 50 mg via ORAL
  Filled 2022-08-06 (×8): qty 1

## 2022-08-06 MED ORDER — ENSURE ENLIVE PO LIQD
237.0000 mL | Freq: Three times a day (TID) | ORAL | Status: DC
  Administered 2022-08-06 – 2022-08-12 (×14): 237 mL via ORAL

## 2022-08-06 MED ORDER — ADULT MULTIVITAMIN W/MINERALS CH
1.0000 | ORAL_TABLET | Freq: Every day | ORAL | Status: DC
  Filled 2022-08-06 (×2): qty 1

## 2022-08-06 NOTE — Progress Notes (Signed)
Recreation Therapy Notes   Date: 08/06/2022  Time: 10:15 am    Location: Craft room   Behavioral response: N/A   Intervention Topic: Problem Solving    Discussion/Intervention: Patient refused to attend group.   Clinical Observations/Feedback:  Patient refused to attend group.    Latise Dilley LRT/CTRS        Caeden Foots 08/06/2022 1:09 PM

## 2022-08-06 NOTE — Progress Notes (Signed)
[facility] MD Progress Note  08/06/2022 3:26 PM Taylor Wiggins  MRN:  [id number] Subjective: Staying mostly in her room today.  Feeling anxious.  Also feeling sick to her stomach.  No aggression or violence.  Not complaining of her acting delusional Principal Problem: Amphetamine and psychostimulant-induced psychosis with delusions ([place]) Diagnosis: Principal Problem:   Amphetamine and psychostimulant-induced psychosis with delusions ([place]) Active Problems:   Anxiety   Amphetamine use disorder, severe (HCC)  Total Time spent with patient: 30 minutes  Past Psychiatric History: Past history of substance abuse primarily  Past Medical History:  Past Medical History:  Diagnosis Date   Adult ADHD    Anxiety    Asthma    IBS (irritable bowel syndrome)    Opioid use disorder    No longer on buprenorphine therapy   PTSD (post-traumatic stress disorder)     Past Surgical History:  Procedure Laterality Date   BREAST REDUCTION SURGERY  2005   Family History: History reviewed. No pertinent family history. Family Psychiatric  History: See previous Social History:  Social History   Substance and Sexual Activity  Alcohol Use No     Social History   Substance and Sexual Activity  Drug Use Yes   Types: Benzodiazepines, Marijuana, Methamphetamines   Comment: recently    Social History   Socioeconomic History   Marital status: Single    Spouse name: Not on file   Number of children: Not on file   Years of education: Not on file   Highest education level: Not on file  Occupational History   Not on file  Tobacco Use   Smoking status: Every Day    Packs/day: 1.00    Types: Cigarettes    Last attempt to quit: 09/06/2014    Years since quitting: 7.9   Smokeless tobacco: Never  Substance and Sexual Activity   Alcohol use: No   Drug use: Yes    Types: Benzodiazepines, Marijuana, Methamphetamines    Comment: recently   Sexual activity: Never    Birth control/protection: Abstinence   Other Topics Concern   Not on file  Social History Narrative   41 y/o Caucasian female. Married and homeless with polysubstance abuse. Per pt-Graduated from [employer] in 2006 with bachelors degree in [place].   Social Determinants of Health   Financial Resource Strain: Not on file  Food Insecurity: Not on file  Transportation Needs: Not on file  Physical Activity: Not on file  Stress: Not on file  Social Connections: Not on file   Additional Social History:                         Sleep: Fair  Appetite:  Fair  Current Medications: Current Facility-Administered Medications  Medication Dose Route Frequency Provider Last Rate Last Admin   acetaminophen (TYLENOL) tablet 1,000 mg  1,000 mg Oral Q6H PRN Sherlon Handing, NP       alum & mag hydroxide-simeth (MAALOX/MYLANTA) 200-200-20 MG/5ML suspension 30 mL  30 mL Oral Q4H PRN Waldon Merl F, NP       feeding supplement (ENSURE ENLIVE / ENSURE PLUS) liquid 237 mL  237 mL Oral TID BM Anisha Starliper T, MD   237 mL at 08/06/22 1100   hydrOXYzine (ATARAX) tablet 50 mg  50 mg Oral Q6H PRN Tosca Pletz T, MD       magnesium hydroxide (MILK OF MAGNESIA) suspension 30 mL  30 mL Oral Daily PRN Sherlon Handing,  NP       multivitamin with minerals tablet 1 tablet  1 tablet Oral Daily Nannette Zill, Madie Reno, MD       ondansetron (ZOFRAN-ODT) disintegrating tablet 4 mg  4 mg Oral Q8H PRN Beatric Fulop, Madie Reno, MD       QUEtiapine (SEROQUEL) tablet 100 mg  100 mg Oral QHS PRN Dondi Burandt, Madie Reno, MD        Lab Results:  Results for orders placed or performed during the hospital encounter of 08/05/22 (from the past 48 hour(s))  Hemoglobin A1c     Status: None   Collection Time: 08/06/22  6:33 AM  Result Value Ref Range   Hgb A1c MFr Bld 5.3 4.8 - 5.6 %    Comment: (NOTE) Pre diabetes:          5.7%-6.4%  Diabetes:              >6.4%  Glycemic control for   <7.0% adults with diabetes    Mean Plasma Glucose 105.41  mg/dL    Comment: Performed at [facility], [place] [address], [place], [place] [address]  Lipid panel     Status: Abnormal   Collection Time: 08/06/22  6:33 AM  Result Value Ref Range   Cholesterol 184 0 - 200 mg/dL   Triglycerides 169 (H) <150 mg/dL   HDL 53 >40 mg/dL   Total CHOL/HDL Ratio 3.5 RATIO   VLDL 34 0 - 40 mg/dL   LDL Cholesterol 97 0 - 99 mg/dL    Comment:        Total Cholesterol/HDL:CHD Risk Coronary Heart Disease Risk Table                     Men   Women  1/2 Average Risk   3.4   3.3  Average Risk       5.0   4.4  2 X Average Risk   9.6   7.1  3 X Average Risk  23.4   11.0        Use the calculated Patient Ratio above and the CHD Risk Table to determine the patient's CHD Risk.        ATP III CLASSIFICATION (LDL):  <100     mg/dL   Optimal  100-129  mg/dL   Near or Above                    Optimal  130-159  mg/dL   Borderline  160-189  mg/dL   High  >190     mg/dL   Very High Performed at [facility], [address] [place], [place] [address]     Blood Alcohol level:  Lab Results  Component Value Date   [facility] <10 08/03/2022   ETH <10 23/76/2831    Metabolic Disorder Labs: Lab Results  Component Value Date   HGBA1C 5.3 08/06/2022   MPG 105.41 08/06/2022   MPG 108.28 07/06/2022   No results found for: "PROLACTIN" Lab Results  Component Value Date   CHOL 184 08/06/2022   TRIG 169 (H) 08/06/2022   HDL 53 08/06/2022   CHOLHDL 3.5 08/06/2022   VLDL 34 08/06/2022   LDLCALC 97 08/06/2022   LDLCALC 119 (H) 07/06/2022    Physical Findings: AIMS:  , ,  ,  ,    CIWA:    COWS:     Musculoskeletal: Strength & Muscle Tone: within normal limits Gait & Station: normal Patient leans: N/A  Psychiatric Specialty Exam:  Presentation  General Appearance: Appropriate for Environment; Disheveled  Eye Contact:Fair  Speech:Clear and Coherent  Speech Volume:Decreased  Handedness:Right   Mood and Affect  Mood:Depressed;  Dysphoric; Hopeless; Worthless  Affect:Congruent   Thought Process  Thought Processes:Goal Directed  Descriptions of Associations:Intact  Orientation:Full (Time, Place and Person)  Thought Content:Logical; WDL  History of Schizophrenia/Schizoaffective disorder:No  Duration of Psychotic Symptoms:N/A  Hallucinations:No data recorded Ideas of Reference:None  Suicidal Thoughts:No data recorded Homicidal Thoughts:No data recorded  Sensorium  Memory:Immediate Fair  Judgment:Impaired  Insight:Lacking   Executive Functions  Concentration:Fair  Attention Span:Fair  [place]   Psychomotor Activity  Psychomotor Activity:No data recorded  Assets  Assets:Communication Skills; Desire for Improvement; Housing   Sleep  Sleep:No data recorded   Physical Exam: Physical Exam Vitals and nursing note reviewed.  Constitutional:      Appearance: Normal appearance.  HENT:     Head: Normocephalic and atraumatic.     Mouth/Throat:     Pharynx: Oropharynx is clear.  Eyes:     Pupils: Pupils are equal, round, and reactive to light.  Cardiovascular:     Rate and Rhythm: Normal rate and regular rhythm.  Pulmonary:     Effort: Pulmonary effort is normal.     Breath sounds: Normal breath sounds.  Abdominal:     General: Abdomen is flat.     Palpations: Abdomen is soft.  Musculoskeletal:        General: Normal range of motion.  Skin:    General: Skin is warm and dry.  Neurological:     General: No focal deficit present.     Mental Status: She is alert. Mental status is at baseline.  Psychiatric:        Attention and Perception: She is inattentive.        Mood and Affect: Mood is anxious. Affect is blunt.        Speech: Speech is delayed.        Behavior: Behavior is withdrawn. Behavior is cooperative.        Thought Content: Thought content normal.        Cognition and Memory: Memory is impaired.    Review of Systems   Constitutional: Negative.   HENT: Negative.    Eyes: Negative.   Respiratory: Negative.    Cardiovascular: Negative.   Gastrointestinal: Negative.   Musculoskeletal: Negative.   Skin: Negative.   Neurological: Negative.   Psychiatric/Behavioral:  The patient is nervous/anxious.    Blood pressure 90/77, pulse 95, temperature 97.9 F (36.6 C), temperature source Oral, resp. rate 18, height '5\' 7"'$  (1.702 m), weight 56.2 kg, last menstrual period 07/22/2022, SpO2 99 %. Body mass index is 19.42 kg/m.   Treatment Plan Summary: Plan added hydroxyzine as needed for anxiety and Zofran as needed for nausea.  Supportive counseling.  Encourage group attendance when possible.  Make sure she is eating and drinking so that her blood pressure does not drop any farther.  No change to other medicine  Alethia Berthold, MD 08/06/2022, 3:26 PM

## 2022-08-06 NOTE — BHH Counselor (Signed)
CSW attempted to meet with pt for completion of the PSA. She endorsed nausea and just not feeling up to participating at this time. CSW stated that he would check in with her later to see how she was feeling.   CSW checked-in with pt later in the day. She endorsed feeling more nausous than she had been previously. CSW informed her that they could try again tomorrow. She agreed and was very apologetic regarding her nausea. No other concerns expressed. Contact ended without incident.   Chalmers Guest. Taylor Wiggins, MSW, [place], [place] 08/06/2022 2:49 PM

## 2022-08-06 NOTE — Group Note (Signed)
[facility] LCSW Group Therapy Note   Group Date: 08/06/2022 Start Time: 1300 End Time: 1400  Type of Therapy/Topic:  Group Therapy:  Feelings about Diagnosis  Participation Level:  Did Not Attend    Description of Group:    This group will allow patients to explore their thoughts and feelings about diagnoses they have received. Patients will be guided to explore their level of understanding and acceptance of these diagnoses. Facilitator will encourage patients to process their thoughts and feelings about the reactions of others to their diagnosis, and will guide patients in identifying ways to discuss their diagnosis with significant others in their lives. This group will be process-oriented, with patients participating in exploration of their own experiences as well as giving and receiving support and challenge from other group members.   Therapeutic Goals: 1. Patient will demonstrate understanding of diagnosis as evidence by identifying two or more symptoms of the disorder:  2. Patient will be able to express two feelings regarding the diagnosis 3. Patient will demonstrate ability to communicate their needs through discussion and/or role plays  Summary of Patient Progress: X   Therapeutic Modalities:   Cognitive Behavioral Therapy Brief Therapy Feelings Identification    [name], LCSW

## 2022-08-06 NOTE — Progress Notes (Signed)
Patient stated that she will take her scheduled Multivitamin later, when her stomach feels a little better. Patient did not request any PRN medication from this writer.

## 2022-08-06 NOTE — Progress Notes (Signed)
NUTRITION ASSESSMENT  Pt identified as at risk on the Malnutrition Screen Tool  INTERVENTION:  -Ensure Enlive po TID, each supplement provides 350 kcal and 20 grams of protein -MVI with minerals daily  NUTRITION DIAGNOSIS: Unintentional weight loss related to sub-optimal intake as evidenced by pt report.   Goal: Pt to meet >/= 90% of their estimated nutrition needs.  Monitor:  PO intake  Assessment:  Pt with history of substance use presented with suicidal ideation. She has amphetamine and psychostimulant-induced psychosis with delusions.   Pt currently on a regular diet. No meal completion data available to assess at this time.   Reviewed wt hx; pt has experienced a 8.2% wt loss over the past month, which is significant for time frame. Pt is at high risk for malnutrition, however, unable to identify at this time. Pt would greatly benefit from addition of oral nutrition supplements.   Medications reviewed.   Labs reviewed.   41 y.o. female  Height: Ht Readings from Last 1 Encounters:  08/05/22 '5\' 7"'$  (1.702 m)    Weight: Wt Readings from Last 1 Encounters:  08/05/22 56.2 kg    Weight Hx: Wt Readings from Last 10 Encounters:  08/05/22 56.2 kg  08/03/22 56.7 kg  07/12/22 61.2 kg  06/22/22 59 kg  06/11/22 59.9 kg  06/09/22 59 kg  05/03/22 56.7 kg  08/16/21 63.5 kg  11/30/20 65.8 kg  04/01/20 63.5 kg    BMI:  Body mass index is 19.42 kg/m. BMI WDL.  Estimated Nutritional Needs: Kcal: 25-30 kcal/kg Protein: > 1 gram protein/kg Fluid: 1 ml/kcal  Diet Order:  Diet Order             Diet regular Room service appropriate? Yes; Fluid consistency: Thin  Diet effective now                  Pt is also offered choice of unit snacks mid-morning and mid-afternoon.  Pt is eating as desired.   Lab results and medications reviewed.   Loistine Chance, RD, LDN, [place] Registered Dietitian II Certified Diabetes Care and Education Specialist Please refer to [facility]  for RD and/or RD on-call/weekend/after hours pager

## 2022-08-06 NOTE — Progress Notes (Signed)
Patient came to the nurses station asking this Probation officer for something for anxiety and nausea. PRN medications were given to patient, in which she tolerated med administration well, without any issues. Patient remains safe on the unit.

## 2022-08-06 NOTE — Progress Notes (Signed)
Isolative to self and room. Pleasant and cooperative. Endorses anxiety and depression. Slept well without medication.  Encouragement and support provided. Safety checks maintained. Medications given as prescribed, pt remains safe on unit with q 15 min checks.

## 2022-08-06 NOTE — Plan of Care (Signed)
D- Patient alert and oriented. Patient presented in a depressed, but pleasant mood on assessment stating that she slept "so-so" last night, with complaints of stomach issues, but she did not request any PRN medication from this Probation officer. Patient denied anxiety, but endorsed depression, stating that "the exit plan" is why she's feeling this way. Patient also denied SI, HI, AVH, and pain, stating "not yet today". Patient's goal for today is "just to get to feeling normal and back on track".  A- Support and encouragement provided.  Routine safety checks conducted every 15 minutes. Patient informed to notify staff with problems or concerns.  R- Patient contracts for safety at this time. Patient receptive, calm, and cooperative. Patient interacts well with others on the unit. Patient remains safe at this time.  Problem: Education: Goal: Knowledge of General Education information will improve Description: Including pain rating scale, medication(s)/side effects and non-pharmacologic comfort measures Outcome: Progressing   Problem: Health Behavior/Discharge Planning: Goal: Ability to manage health-related needs will improve Outcome: Progressing   Problem: Clinical Measurements: Goal: Ability to maintain clinical measurements within normal limits will improve Outcome: Progressing Goal: Will remain free from infection Outcome: Progressing Goal: Diagnostic test results will improve Outcome: Progressing Goal: Respiratory complications will improve Outcome: Progressing Goal: Cardiovascular complication will be avoided Outcome: Progressing   Problem: Activity: Goal: Risk for activity intolerance will decrease Outcome: Progressing   Problem: Nutrition: Goal: Adequate nutrition will be maintained Outcome: Progressing   Problem: Coping: Goal: Level of anxiety will decrease Outcome: Progressing   Problem: Elimination: Goal: Will not experience complications related to bowel motility Outcome:  Progressing Goal: Will not experience complications related to urinary retention Outcome: Progressing   Problem: Pain Managment: Goal: General experience of comfort will improve Outcome: Progressing   Problem: Safety: Goal: Ability to remain free from injury will improve Outcome: Progressing   Problem: Skin Integrity: Goal: Risk for impaired skin integrity will decrease Outcome: Progressing   Problem: Education: Goal: Knowledge of [place] General Education information/materials will improve Outcome: Progressing Goal: Emotional status will improve Outcome: Progressing Goal: Mental status will improve Outcome: Progressing Goal: Verbalization of understanding the information provided will improve Outcome: Progressing   Problem: Activity: Goal: Interest or engagement in activities will improve Outcome: Progressing Goal: Sleeping patterns will improve Outcome: Progressing   Problem: Coping: Goal: Ability to verbalize frustrations and anger appropriately will improve Outcome: Progressing Goal: Ability to demonstrate self-control will improve Outcome: Progressing   Problem: Health Behavior/Discharge Planning: Goal: Identification of resources available to assist in meeting health care needs will improve Outcome: Progressing Goal: Compliance with treatment plan for underlying cause of condition will improve Outcome: Progressing   Problem: Physical Regulation: Goal: Ability to maintain clinical measurements within normal limits will improve Outcome: Progressing   Problem: Safety: Goal: Periods of time without injury will increase Outcome: Progressing   Problem: Education: Goal: Ability to state activities that reduce stress will improve Outcome: Progressing   Problem: Coping: Goal: Ability to identify and develop effective coping behavior will improve Outcome: Progressing   Problem: Self-Concept: Goal: Ability to identify factors that promote anxiety will  improve Outcome: Progressing Goal: Level of anxiety will decrease Outcome: Progressing Goal: Ability to modify response to factors that promote anxiety will improve Outcome: Progressing

## 2022-08-06 NOTE — Progress Notes (Signed)
Patient decided against taking her scheduled multivitamin, stating that her stomach still feels upset.

## 2022-08-07 DIAGNOSIS — F15950 Other stimulant use, unspecified with stimulant-induced psychotic disorder with delusions: Secondary | ICD-10-CM | POA: Diagnosis not present

## 2022-08-07 MED ORDER — GABAPENTIN 300 MG PO CAPS
300.0000 mg | ORAL_CAPSULE | Freq: Four times a day (QID) | ORAL | Status: DC | PRN
  Administered 2022-08-08 – 2022-08-11 (×5): 300 mg via ORAL
  Filled 2022-08-07 (×5): qty 1

## 2022-08-07 NOTE — Progress Notes (Signed)
[facility] MD Progress Note  08/07/2022 10:49 AM Taylor Wiggins  MRN:  [id number] Subjective: Patient reports still feeling anxious but admits it is gradually getting better.  Attended treatment team today.  Not sleeping well and asking for medicine for her nerves.  We can increase the hydroxyzine.  She also wants to inquire about substance abuse rehab which is realistic and a good idea. Principal Problem: Amphetamine and psychostimulant-induced psychosis with delusions ([place]) Diagnosis: Principal Problem:   Amphetamine and psychostimulant-induced psychosis with delusions ([place]) Active Problems:   Anxiety   Amphetamine use disorder, severe (HCC)  Total Time spent with patient: 30 minutes  Past Psychiatric History: Past history of recurrent substance abuse issues with anxiety mood and psychotic symptoms when using  Past Medical History:  Past Medical History:  Diagnosis Date   Adult ADHD    Anxiety    Asthma    IBS (irritable bowel syndrome)    Opioid use disorder    No longer on buprenorphine therapy   PTSD (post-traumatic stress disorder)     Past Surgical History:  Procedure Laterality Date   BREAST REDUCTION SURGERY  2005   Family History: History reviewed. No pertinent family history. Family Psychiatric  History: See previous Social History:  Social History   Substance and Sexual Activity  Alcohol Use No     Social History   Substance and Sexual Activity  Drug Use Yes   Types: Benzodiazepines, Marijuana, Methamphetamines   Comment: recently    Social History   Socioeconomic History   Marital status: Single    Spouse name: Not on file   Number of children: Not on file   Years of education: Not on file   Highest education level: Not on file  Occupational History   Not on file  Tobacco Use   Smoking status: Every Day    Packs/day: 1.00    Types: Cigarettes    Last attempt to quit: 09/06/2014    Years since quitting: 7.9   Smokeless tobacco: Never  Substance and  Sexual Activity   Alcohol use: No   Drug use: Yes    Types: Benzodiazepines, Marijuana, Methamphetamines    Comment: recently   Sexual activity: Never    Birth control/protection: Abstinence  Other Topics Concern   Not on file  Social History Narrative   41 y/o Caucasian female. Married and homeless with polysubstance abuse. Per pt-Graduated from [employer] in 2006 with bachelors degree in [place].   Social Determinants of Health   Financial Resource Strain: Not on file  Food Insecurity: Not on file  Transportation Needs: Not on file  Physical Activity: Not on file  Stress: Not on file  Social Connections: Not on file   Additional Social History:                         Sleep: Fair  Appetite:  Fair  Current Medications: Current Facility-Administered Medications  Medication Dose Route Frequency Provider Last Rate Last Admin   acetaminophen (TYLENOL) tablet 1,000 mg  1,000 mg Oral Q6H PRN Sherlon Handing, NP       alum & mag hydroxide-simeth (MAALOX/MYLANTA) 200-200-20 MG/5ML suspension 30 mL  30 mL Oral Q4H PRN Waldon Merl F, NP       feeding supplement (ENSURE ENLIVE / ENSURE PLUS) liquid 237 mL  237 mL Oral TID BM Danyel Tobey T, MD   237 mL at 08/07/22 0950   hydrOXYzine (ATARAX) tablet 50 mg  50 mg Oral Q6H PRN Jadesola Poynter, Madie Reno, MD   50 mg at 08/07/22 0949   magnesium hydroxide (MILK OF MAGNESIA) suspension 30 mL  30 mL Oral Daily PRN Sherlon Handing, NP       ondansetron (ZOFRAN-ODT) disintegrating tablet 4 mg  4 mg Oral Q8H PRN Zac Torti, Madie Reno, MD   4 mg at 08/06/22 1600   QUEtiapine (SEROQUEL) tablet 100 mg  100 mg Oral QHS PRN Atziri Zubiate, Madie Reno, MD        Lab Results:  Results for orders placed or performed during the hospital encounter of 08/05/22 (from the past 48 hour(s))  Hemoglobin A1c     Status: None   Collection Time: 08/06/22  6:33 AM  Result Value Ref Range   Hgb A1c MFr Bld 5.3 4.8 - 5.6 %    Comment: (NOTE) Pre  diabetes:          5.7%-6.4%  Diabetes:              >6.4%  Glycemic control for   <7.0% adults with diabetes    Mean Plasma Glucose 105.41 mg/dL    Comment: Performed at [facility] [address] [place], [place] [address]  Lipid panel     Status: Abnormal   Collection Time: 08/06/22  6:33 AM  Result Value Ref Range   Cholesterol 184 0 - 200 mg/dL   Triglycerides 169 (H) <150 mg/dL   HDL 53 >40 mg/dL   Total CHOL/HDL Ratio 3.5 RATIO   VLDL 34 0 - 40 mg/dL   LDL Cholesterol 97 0 - 99 mg/dL    Comment:        Total Cholesterol/HDL:CHD Risk Coronary Heart Disease Risk Table                     Men   Women  1/2 Average Risk   3.4   3.3  Average Risk       5.0   4.4  2 X Average Risk   9.6   7.1  3 X Average Risk  23.4   11.0        Use the calculated Patient Ratio above and the CHD Risk Table to determine the patient's CHD Risk.        ATP III CLASSIFICATION (LDL):  <100     mg/dL   Optimal  100-129  mg/dL   Near or Above                    Optimal  130-159  mg/dL   Borderline  160-189  mg/dL   High  >190     mg/dL   Very High Performed at [facility], [address] [place], [place] [address]     Blood Alcohol level:  Lab Results  Component Value Date   [facility] <10 08/03/2022   ETH <10 68/34[birth year]1962    Metabolic Disorder Labs: Lab Results  Component Value Date   HGBA1C 5.3 08/06/2022   MPG 105.41 08/06/2022   MPG 108.28 07/06/2022   No results found for: "PROLACTIN" Lab Results  Component Value Date   CHOL 184 08/06/2022   TRIG 169 (H) 08/06/2022   HDL 53 08/06/2022   CHOLHDL 3.5 08/06/2022   VLDL 34 08/06/2022   LDLCALC 97 08/06/2022   LDLCALC 119 (H) 07/06/2022    Physical Findings: AIMS:  , ,  ,  ,    CIWA:    COWS:  Musculoskeletal: Strength & Muscle Tone: within normal limits Gait & Station: normal Patient leans: N/A  Psychiatric Specialty Exam:  Presentation  General Appearance: Appropriate for Environment;  Disheveled  Eye Contact:Fair  Speech:Clear and Coherent  Speech Volume:Decreased  Handedness:Right   Mood and Affect  Mood:Depressed; Dysphoric; Hopeless; Worthless  Affect:Congruent   Thought Process  Thought Processes:Goal Directed  Descriptions of Associations:Intact  Orientation:Full (Time, Place and Person)  Thought Content:Logical; WDL  History of Schizophrenia/Schizoaffective disorder:No  Duration of Psychotic Symptoms:N/A  Hallucinations:No data recorded Ideas of Reference:None  Suicidal Thoughts:No data recorded Homicidal Thoughts:No data recorded  Sensorium  Memory:Immediate Fair  Judgment:Impaired  Insight:Lacking   Executive Functions  Concentration:Fair  Attention Span:Fair  [place]   Psychomotor Activity  Psychomotor Activity:No data recorded  Assets  Assets:Communication Skills; Desire for Improvement; Housing   Sleep  Sleep:No data recorded   Physical Exam: Physical Exam Vitals and nursing note reviewed.  Constitutional:      Appearance: Normal appearance.  HENT:     Head: Normocephalic and atraumatic.     Mouth/Throat:     Pharynx: Oropharynx is clear.  Eyes:     Pupils: Pupils are equal, round, and reactive to light.  Cardiovascular:     Rate and Rhythm: Normal rate and regular rhythm.  Pulmonary:     Effort: Pulmonary effort is normal.     Breath sounds: Normal breath sounds.  Abdominal:     General: Abdomen is flat.     Palpations: Abdomen is soft.  Musculoskeletal:        General: Normal range of motion.  Skin:    General: Skin is warm and dry.  Neurological:     General: No focal deficit present.     Mental Status: She is alert. Mental status is at baseline.  Psychiatric:        Attention and Perception: Attention normal.        Mood and Affect: Mood is anxious.        Speech: Speech normal.        Behavior: Behavior is cooperative.        Thought Content:  Thought content normal.        Cognition and Memory: Cognition normal.        Judgment: Judgment normal.    Review of Systems  Constitutional: Negative.   HENT: Negative.    Eyes: Negative.   Respiratory: Negative.    Cardiovascular: Negative.   Gastrointestinal: Negative.   Musculoskeletal: Negative.   Skin: Negative.   Neurological: Negative.    Blood pressure 91/69, pulse 83, temperature 98 F (36.7 C), temperature source Oral, resp. rate 17, height '5\' 7"'$  (1.702 m), weight 56.2 kg, last menstrual period 07/22/2022, SpO2 100 %. Body mass index is 19.42 kg/m.   Treatment Plan Summary: Medication management and Plan patient will meet with social work and treatment team to discuss rehab options.  Increase availability of as needed medicine for anxiety.  Engage in individual and group therapy on the unit.  Alethia Berthold, MD 08/07/2022, 10:49 AM

## 2022-08-07 NOTE — Progress Notes (Addendum)
Recreation Therapy Notes      Date: 08/07/2022   Time: 10:30 am     Location: Court yard    Behavioral response: N/A   Intervention Topic: Decision Making  Discussion/Intervention: Patient refused to attend group.    Clinical Observations/Feedback:  Patient refused to attend group.    Yumi Insalaco LRT/CTRS      Iziah Cates 08/07/2022 10:46 AM
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
## 2022-08-07 NOTE — Plan of Care (Signed)
D: Pt alert and oriented. Pt endorses experiencing anxiety/depression at this time. Pt reports experiencing 5/10 abdominal pain at this time, prn medication offered and refused by pt at this time. Pt request just to rest at this time. Pt denies experiencing any SI/HI, or AVH at this time.   A: Scheduled medications administered to pt, per MD orders. Support and encouragement provided. Frequent verbal contact made. Routine safety checks conducted q15 minutes.   R: No adverse drug reactions noted. Pt verbally contracts for safety at this time. Pt compliant with medications. Pt interacts minimally with others on the unit. Pt remains safe at this time. Will continue to monitor.   Problem: Nutrition: Goal: Adequate nutrition will be maintained Outcome: Not Progressing   Problem: Coping: Goal: Level of anxiety will decrease Outcome: Not Progressing

## 2022-08-07 NOTE — Progress Notes (Signed)
Patient has been isolative to her room since the start of the shift. She was in the bed, but not sleep upon my entrance.  Staff inquired if she would like her Ensure supplemental drink and if she would like snack before the food was put up for the night.  Patient was receptive to getting the Ensure but declined snack stating that the food was not good for her stomach. She does say that her stomach is feeling better at this encounter and that she didn't need anything for pain or nausea.  Rated her stomach pain at 2/10 at this encounter.  She denies si/hi/avh at this time.  She does continue to endorse depression and anxiety rating both at 5/10. Patient has no scheduled QHS medication and declined the PRN medication that is available for her. Support and encouragement offered to her to seek staff if she changed her mind. Will continue to monitor with q 15 minute safety checks.      C Dillenburg-Nicholson, LPN

## 2022-08-07 NOTE — BH IP Treatment Plan (Signed)
Interdisciplinary Treatment and Diagnostic Plan Update  08/07/2022 Time of Session: 9:46AM Taylor Wiggins MRN: [id number]  Principal Diagnosis: Amphetamine and psychostimulant-induced psychosis with delusions ([place])  Secondary Diagnoses: Principal Problem:   Amphetamine and psychostimulant-induced psychosis with delusions ([place]) Active Problems:   Anxiety   Amphetamine use disorder, severe (HCC)   Current Medications:  Current Facility-Administered Medications  Medication Dose Route Frequency Provider Last Rate Last Admin   acetaminophen (TYLENOL) tablet 1,000 mg  1,000 mg Oral Q6H PRN Sherlon Handing, NP       alum & mag hydroxide-simeth (MAALOX/MYLANTA) 200-200-20 MG/5ML suspension 30 mL  30 mL Oral Q4H PRN Sherlon Handing, NP       feeding supplement (ENSURE ENLIVE / ENSURE PLUS) liquid 237 mL  237 mL Oral TID BM Clapacs, John T, MD   237 mL at 08/07/22 0950   hydrOXYzine (ATARAX) tablet 50 mg  50 mg Oral Q6H PRN Clapacs, Madie Reno, MD   50 mg at 08/07/22 0949   magnesium hydroxide (MILK OF MAGNESIA) suspension 30 mL  30 mL Oral Daily PRN Sherlon Handing, NP       multivitamin with minerals tablet 1 tablet  1 tablet Oral Daily Clapacs, John T, MD       ondansetron (ZOFRAN-ODT) disintegrating tablet 4 mg  4 mg Oral Q8H PRN Clapacs, John T, MD   4 mg at 08/06/22 1600   QUEtiapine (SEROQUEL) tablet 100 mg  100 mg Oral QHS PRN Clapacs, Madie Reno, MD       PTA Medications: Medications Prior to Admission  Medication Sig Dispense Refill Last Dose   amphetamine-dextroamphetamine (ADDERALL) 30 MG tablet Take 1 tablet by mouth 2 (two) times daily.      atomoxetine (STRATTERA) 40 MG capsule Take 1 capsule (40 mg total) by mouth daily. 30 capsule 0    clonazePAM (KLONOPIN) 1 MG tablet Take 1 mg by mouth daily as needed for anxiety.      lamoTRIgine (LAMICTAL) 25 MG tablet Take 1 tablet (25 mg total) by mouth daily. 30 tablet 0    omeprazole (PRILOSEC) 20 MG capsule Take 1 capsule (20 mg  total) by mouth daily. (Patient taking differently: Take 20 mg by mouth daily as needed (For heartburn or acid reflux).) 30 capsule 0     Patient Stressors: Marital or family conflict    Patient Strengths: Motivation for treatment/growth   Treatment Modalities: Medication Management, Group therapy, Case management,  1 to 1 session with clinician, Psychoeducation, Recreational therapy.   Physician Treatment Plan for Primary Diagnosis: Amphetamine and psychostimulant-induced psychosis with delusions ([place]) Long Term Goal(s): Improvement in symptoms so as ready for discharge   Short Term Goals: Ability to identify triggers associated with substance abuse/mental health issues will improve Ability to verbalize feelings will improve Ability to disclose and discuss suicidal ideas Ability to demonstrate self-control will improve  Medication Management: Evaluate patient's response, side effects, and tolerance of medication regimen.  Therapeutic Interventions: 1 to 1 sessions, Unit Group sessions and Medication administration.  Evaluation of Outcomes: Not Met  Physician Treatment Plan for Secondary Diagnosis: Principal Problem:   Amphetamine and psychostimulant-induced psychosis with delusions ([place]) Active Problems:   Anxiety   Amphetamine use disorder, severe (HCC)  Long Term Goal(s): Improvement in symptoms so as ready for discharge   Short Term Goals: Ability to identify triggers associated with substance abuse/mental health issues will improve Ability to verbalize feelings will improve Ability to disclose and discuss suicidal ideas Ability to demonstrate self-control  will improve     Medication Management: Evaluate patient's response, side effects, and tolerance of medication regimen.  Therapeutic Interventions: 1 to 1 sessions, Unit Group sessions and Medication administration.  Evaluation of Outcomes: Not Met   RN Treatment Plan for Primary Diagnosis: Amphetamine and  psychostimulant-induced psychosis with delusions ([place]) Long Term Goal(s): Knowledge of disease and therapeutic regimen to maintain health will improve  Short Term Goals: Ability to remain free from injury will improve, Ability to verbalize frustration and anger appropriately will improve, Ability to demonstrate self-control, Ability to participate in decision making will improve, Ability to verbalize feelings will improve, Ability to disclose and discuss suicidal ideas, Ability to identify and develop effective coping behaviors will improve, and Compliance with prescribed medications will improve  Medication Management: RN will administer medications as ordered by provider, will assess and evaluate patient's response and provide education to patient for prescribed medication. RN will report any adverse and/or side effects to prescribing provider.  Therapeutic Interventions: 1 on 1 counseling sessions, Psychoeducation, Medication administration, Evaluate responses to treatment, Monitor vital signs and CBGs as ordered, Perform/monitor CIWA, COWS, AIMS and Fall Risk screenings as ordered, Perform wound care treatments as ordered.  Evaluation of Outcomes: Not Met   LCSW Treatment Plan for Primary Diagnosis: Amphetamine and psychostimulant-induced psychosis with delusions ([place]) Long Term Goal(s): Safe transition to appropriate next level of care at discharge, Engage patient in therapeutic group addressing interpersonal concerns.  Short Term Goals: Engage patient in aftercare planning with referrals and resources, Increase social support, Increase ability to appropriately verbalize feelings, Increase emotional regulation, Facilitate acceptance of mental health diagnosis and concerns, Facilitate patient progression through stages of change regarding substance use diagnoses and concerns, Identify triggers associated with mental health/substance abuse issues, and Increase skills for wellness and  recovery  Therapeutic Interventions: Assess for all discharge needs, 1 to 1 time with Social worker, Explore available resources and support systems, Assess for adequacy in community support network, Educate family and significant other(s) on suicide prevention, Complete Psychosocial Assessment, Interpersonal group therapy.  Evaluation of Outcomes: Not Met   Progress in Treatment: Attending groups: No. Participating in groups: No. Taking medication as prescribed: Yes. Toleration medication: Yes. Family/Significant other contact made: No, will contact:  if given permission.  Patient understands diagnosis: Yes. Discussing patient identified problems/goals with staff: Yes. Medical problems stabilized or resolved: Yes. Denies suicidal/homicidal ideation: Yes. Issues/concerns per patient self-inventory: No. Other: none.  New problem(s) identified: No, Describe:  none identified.  New Short Term/Long Term Goal(s): detox, medication management for mood stabilization; elimination of SI thoughts; development of comprehensive mental wellness/sobriety plan.  Patient Goals:  "Getting into a rehab facility and getting my anxiety under control."  Discharge Plan or Barriers: CSW will assist pt with development of an appropriate aftercare/discharge plan.   Reason for Continuation of Hospitalization: Anxiety Depression Medication stabilization Suicidal ideation Withdrawal symptoms  Estimated Length of Stay: 1-7 days  Last 3 [place] Suicide Severity Risk Score: Flowsheet Row Admission (Current) from 08/05/2022 in [place] ED from 08/03/2022 in [facility] from 07/19/2022 in [place] High Risk High Risk High Risk       Last [facility] 2/9 Scores:    07/19/2022    8:06 PM 07/06/2022    9:49 AM  Depression screen PHQ 2/9  Decreased Interest 2 2  Down, Depressed, Hopeless 3 1   PHQ - 2 Score 5 3  Altered sleeping  2 2  Tired, decreased energy 2 2  Change in appetite 1 1  Feeling bad or failure about yourself  3 2  Trouble concentrating 2 1  Moving slowly or fidgety/restless 0 0  Suicidal thoughts 1 1  PHQ-9 Score 16 12  Difficult doing work/chores Extremely dIfficult Somewhat difficult    Scribe for Treatment Team: [name], LCSW 08/07/2022 10:12 AM

## 2022-08-07 NOTE — Progress Notes (Signed)
Patient has been isolative to her room since beginning of the shift.  Staff inquired if she would be interested in receiving snack this evening and asked again if she wanted the ensure that had been ordered for her. Patient declined. Stating that she was not  hungry and that she had stomach issues and did not want to put anything on her stomach.  Staff inquired if she would need any medication to help aid in sleep, but patient declined stating she has no problem getting to sleep. She denies si/hi/avh anxiety and pain at this encounter. Does endorse depression, but states that it is mild, rating 2/10. Will continue  to monitor. Support and encouragement offered. Q 15 minute safety checks in place for safety.      C Dehner-Nicholson, LPN

## 2022-08-07 NOTE — Progress Notes (Signed)
Pt refused her multivitamin this morning stating here stomach has settled down and doesn't want to upset it. MD was notified and is aware.

## 2022-08-07 NOTE — Plan of Care (Signed)
  Problem: Education: Goal: Knowledge of General Education information will improve Description: Including pain rating scale, medication(s)/side effects and non-pharmacologic comfort measures Outcome: Progressing   Problem: Health Behavior/Discharge Planning: Goal: Ability to manage health-related needs will improve Outcome: Progressing   Problem: Clinical Measurements: Goal: Ability to maintain clinical measurements within normal limits will improve Outcome: Progressing Goal: Will remain free from infection Outcome: Progressing Goal: Diagnostic test results will improve Outcome: Progressing Goal: Respiratory complications will improve Outcome: Progressing Goal: Cardiovascular complication will be avoided Outcome: Progressing   Problem: Self-Concept: Goal: Ability to identify factors that promote anxiety will improve Outcome: Progressing Goal: Level of anxiety will decrease Outcome: Progressing Goal: Ability to modify response to factors that promote anxiety will improve Outcome: Progressing   Problem: Coping: Goal: Ability to identify and develop effective coping behavior will improve Outcome: Progressing   Problem: Education: Goal: Ability to state activities that reduce stress will improve Outcome: Progressing

## 2022-08-07 NOTE — Plan of Care (Signed)
  Problem: Education: Goal: Knowledge of General Education information will improve Description: Including pain rating scale, medication(s)/side effects and non-pharmacologic comfort measures Outcome: Progressing   Problem: Health Behavior/Discharge Planning: Goal: Ability to manage health-related needs will improve Outcome: Progressing   Problem: Clinical Measurements: Goal: Ability to maintain clinical measurements within normal limits will improve Outcome: Progressing Goal: Will remain free from infection Outcome: Progressing Goal: Diagnostic test results will improve Outcome: Progressing Goal: Respiratory complications will improve Outcome: Progressing Goal: Cardiovascular complication will be avoided Outcome: Progressing   Problem: Self-Concept: Goal: Ability to identify factors that promote anxiety will improve Outcome: Progressing Goal: Level of anxiety will decrease Outcome: Progressing Goal: Ability to modify response to factors that promote anxiety will improve Outcome: Progressing   Problem: Coping: Goal: Ability to identify and develop effective coping behavior will improve Outcome: Progressing   Problem: Education: Goal: Ability to state activities that reduce stress will improve Outcome: Progressing   Problem: Safety: Goal: Periods of time without injury will increase Outcome: Progressing

## 2022-08-08 DIAGNOSIS — F15950 Other stimulant use, unspecified with stimulant-induced psychotic disorder with delusions: Secondary | ICD-10-CM | POA: Diagnosis not present

## 2022-08-08 NOTE — Progress Notes (Signed)
[facility] MD Progress Note  08/08/2022 4:23 PM Taylor Wiggins  MRN:  [id number] Subjective: Follow-up patient with amphetamine induced psychosis.  Continues to feel very anxious.  Stays in bed a lot of the time.  Sick to her stomach a fair bit. Principal Problem: Amphetamine and psychostimulant-induced psychosis with delusions ([place]) Diagnosis: Principal Problem:   Amphetamine and psychostimulant-induced psychosis with delusions ([place]) Active Problems:   Anxiety   Amphetamine use disorder, severe (HCC)  Total Time spent with patient: 30 minutes  Past Psychiatric History: Past history of substance-induced mood symptoms  Past Medical History:  Past Medical History:  Diagnosis Date   Adult ADHD    Anxiety    Asthma    IBS (irritable bowel syndrome)    Opioid use disorder    No longer on buprenorphine therapy   PTSD (post-traumatic stress disorder)     Past Surgical History:  Procedure Laterality Date   BREAST REDUCTION SURGERY  2005   Family History: History reviewed. No pertinent family history. Family Psychiatric  History: See previous Social History:  Social History   Substance and Sexual Activity  Alcohol Use No     Social History   Substance and Sexual Activity  Drug Use Yes   Types: Benzodiazepines, Marijuana, Methamphetamines   Comment: recently    Social History   Socioeconomic History   Marital status: Single    Spouse name: Not on file   Number of children: Not on file   Years of education: Not on file   Highest education level: Not on file  Occupational History   Not on file  Tobacco Use   Smoking status: Every Day    Packs/day: 1.00    Types: Cigarettes    Last attempt to quit: 09/06/2014    Years since quitting: 7.9   Smokeless tobacco: Never  Substance and Sexual Activity   Alcohol use: No   Drug use: Yes    Types: Benzodiazepines, Marijuana, Methamphetamines    Comment: recently   Sexual activity: Never    Birth control/protection: Abstinence   Other Topics Concern   Not on file  Social History Narrative   41 y/o Caucasian female. Married and homeless with polysubstance abuse. Per pt-Graduated from [employer] in 2006 with bachelors degree in [place].   Social Determinants of Health   Financial Resource Strain: Not on file  Food Insecurity: Not on file  Transportation Needs: Not on file  Physical Activity: Not on file  Stress: Not on file  Social Connections: Not on file   Additional Social History:                         Sleep: Fair  Appetite:  Fair  Current Medications: Current Facility-Administered Medications  Medication Dose Route Frequency Provider Last Rate Last Admin   acetaminophen (TYLENOL) tablet 1,000 mg  1,000 mg Oral Q6H PRN Sherlon Handing, NP       alum & mag hydroxide-simeth (MAALOX/MYLANTA) 200-200-20 MG/5ML suspension 30 mL  30 mL Oral Q4H PRN Waldon Merl F, NP       feeding supplement (ENSURE ENLIVE / ENSURE PLUS) liquid 237 mL  237 mL Oral TID BM Richardson Dubree T, MD   237 mL at 08/08/22 1355   gabapentin (NEURONTIN) capsule 300 mg  300 mg Oral Q6H PRN Joesph Marcy, Madie Reno, MD   300 mg at 08/08/22 0937   hydrOXYzine (ATARAX) tablet 50 mg  50 mg Oral Q6H PRN Leidi Astle  T, MD   50 mg at 08/07/22 0949   magnesium hydroxide (MILK OF MAGNESIA) suspension 30 mL  30 mL Oral Daily PRN Waldon Merl F, NP       ondansetron (ZOFRAN-ODT) disintegrating tablet 4 mg  4 mg Oral Q8H PRN Dorinda Stehr T, MD   4 mg at 08/06/22 1600   QUEtiapine (SEROQUEL) tablet 100 mg  100 mg Oral QHS PRN Laurel Smeltz, Madie Reno, MD        Lab Results: No results found for this or any previous visit (from the past 48 hour(s)).  Blood Alcohol level:  Lab Results  Component Value Date   ETH <10 08/03/2022   ETH <10 23/55/7322    Metabolic Disorder Labs: Lab Results  Component Value Date   HGBA1C 5.3 08/06/2022   MPG 105.41 08/06/2022   MPG 108.28 07/06/2022   No results found for:  "PROLACTIN" Lab Results  Component Value Date   CHOL 184 08/06/2022   TRIG 169 (H) 08/06/2022   HDL 53 08/06/2022   CHOLHDL 3.5 08/06/2022   VLDL 34 08/06/2022   LDLCALC 97 08/06/2022   LDLCALC 119 (H) 07/06/2022    Physical Findings: AIMS:  , ,  ,  ,    CIWA:    COWS:     Musculoskeletal: Strength & Muscle Tone: within normal limits Gait & Station: normal Patient leans: N/A  Psychiatric Specialty Exam:  Presentation  General Appearance: Appropriate for Environment; Disheveled  Eye Contact:Fair  Speech:Clear and Coherent  Speech Volume:Decreased  Handedness:Right   Mood and Affect  Mood:Depressed; Dysphoric; Hopeless; Worthless  Affect:Congruent   Thought Process  Thought Processes:Goal Directed  Descriptions of Associations:Intact  Orientation:Full (Time, Place and Person)  Thought Content:Logical; WDL  History of Schizophrenia/Schizoaffective disorder:No  Duration of Psychotic Symptoms:N/A  Hallucinations:No data recorded Ideas of Reference:None  Suicidal Thoughts:No data recorded Homicidal Thoughts:No data recorded  Sensorium  Memory:Immediate Fair  Judgment:Impaired  Insight:Lacking   Executive Functions  Concentration:Fair  Attention Span:Fair  [place]   Psychomotor Activity  Psychomotor Activity:No data recorded  Assets  Assets:Communication Skills; Desire for Improvement; Housing   Sleep  Sleep:No data recorded   Physical Exam: Physical Exam Vitals and nursing note reviewed.  Constitutional:      Appearance: Normal appearance.  HENT:     Head: Normocephalic and atraumatic.     Mouth/Throat:     Pharynx: Oropharynx is clear.  Eyes:     Pupils: Pupils are equal, round, and reactive to light.  Cardiovascular:     Rate and Rhythm: Normal rate and regular rhythm.  Pulmonary:     Effort: Pulmonary effort is normal.     Breath sounds: Normal breath sounds.  Abdominal:      General: Abdomen is flat.     Palpations: Abdomen is soft.  Musculoskeletal:        General: Normal range of motion.  Skin:    General: Skin is warm and dry.  Neurological:     General: No focal deficit present.     Mental Status: She is alert. Mental status is at baseline.  Psychiatric:        Attention and Perception: Attention normal.        Mood and Affect: Mood normal. Affect is blunt.        Speech: Speech is delayed.        Behavior: Behavior is slowed.        Thought Content: Thought content normal.  Cognition and Memory: Memory is impaired.    Review of Systems  Constitutional: Negative.   HENT: Negative.    Eyes: Negative.   Respiratory: Negative.    Cardiovascular: Negative.   Gastrointestinal: Negative.   Musculoskeletal: Negative.   Skin: Negative.   Neurological: Negative.   Psychiatric/Behavioral:  The patient is nervous/anxious.    Blood pressure 91/69, pulse 85, temperature 98.6 F (37 C), temperature source Oral, resp. rate 18, height '5\' 7"'$  (1.702 m), weight 56.2 kg, last menstrual period 07/22/2022, SpO2 93 %. Body mass index is 19.42 kg/m.   Treatment Plan Summary: Plan patient has as needed medicines available for nausea sleep and anxiety.  Encourage patient to let staff know what we can do to help.  Get up out of bed when possible make sure she is eating and drinking well.  Reassess symptoms tomorrow and continue working with treatment team on possible rehab options  Alethia Berthold, MD 08/08/2022, 4:23 PM

## 2022-08-08 NOTE — Group Note (Signed)
[facility] LCSW Group Therapy Note   Group Date: 08/08/2022 Start Time: 1300 End Time: 1400   Type of Therapy/Topic:  Group Therapy:  Balance in Life  Participation Level:  Did Not Attend   Description of Group:    This group will address the concept of balance and how it feels and looks when one is unbalanced. Patients will be encouraged to process areas in their lives that are out of balance, and identify reasons for remaining unbalanced. Facilitators will guide patients utilizing problem- solving interventions to address and correct the stressor making their life unbalanced. Understanding and applying boundaries will be explored and addressed for obtaining  and maintaining a balanced life. Patients will be encouraged to explore ways to assertively make their unbalanced needs known to significant others in their lives, using other group members and facilitator for support and feedback.  Therapeutic Goals: Patient will identify two or more emotions or situations they have that consume much of in their lives. Patient will identify signs/triggers that life has become out of balance:  Patient will identify two ways to set boundaries in order to achieve balance in their lives:  Patient will demonstrate ability to communicate their needs through discussion and/or role plays  Summary of Patient Progress: X   Therapeutic Modalities:   Cognitive Behavioral Therapy Solution-Focused Therapy Assertiveness Training   [name], LCSW

## 2022-08-08 NOTE — Progress Notes (Signed)
Recreation Therapy Notes  INPATIENT RECREATION TR PLAN  Patient Details Name: Taylor Wiggins MRN: [id number] DOB: 1981/09/19 Today's Date: 08/08/2022  Rec Therapy Plan Is patient appropriate for Therapeutic Recreation?: Yes Treatment times per week: at least 3 Estimated Length of Stay: 5-7 days TR Treatment/Interventions: Group participation (Comment)  Discharge Criteria Pt will be discharged from therapy if:: Discharged Treatment plan/goals/alternatives discussed and agreed upon by:: Patient/family  Discharge Summary     [name] 08/08/2022, 1:29 PM

## 2022-08-08 NOTE — [facility] Suicide Risk Assessment (Signed)
[place] INPATIENT:  Family/Significant Other Suicide Prevention Education  Suicide Prevention Education:  Contact Attempts: Debra Ayres/mother [phone]), has been identified by the patient as the family member/significant other with whom the patient will be residing, and identified as the person(s) who will aid the patient in the event of a mental health crisis.  With written consent from the patient, two attempts were made to provide suicide prevention education, prior to and/or following the patient's discharge.  We were unsuccessful in providing suicide prevention education.  A suicide education pamphlet was given to the patient to share with family/significant other.  Date and time of first attempt: 08/08/22 at 15:33 Date and time of second attempt: Second attempt needed.  [name] 08/08/2022, 3:38 PM

## 2022-08-08 NOTE — BHH Counselor (Signed)
CSW met with pt to discussion disposition. She stated that she was interested in rehab post discharge. CSW inquired if pt would mind if the program was faith-based or if it needed to be in any particular area. Pt stated that she did not mind whether it was faith-based or not and does not care where it is located. CSW was also informed that pt has insurance Scientist, clinical (histocompatibility and immunogenetics)). CSW informed pt that facilities would be contacted about their processes and CSW would follow up with her tomorrow. She agreed. No other concerns expressed. Contact ended without incident.   Chalmers Guest. Guerry Bruin, MSW, [place], [place] 08/08/2022 3:07 PM

## 2022-08-08 NOTE — Plan of Care (Signed)
D: Pt alert and oriented. Pt reports experiencing anxiety/depression at this time.  Pt reports experiencing 6/10 abdominal pain at this time, pt refuses prn pain meds at this time. Pt request just to rest. Pt denies experiencing any SI/HI, or AVH at this time.   A: Scheduled medications administered to pt, per MD orders. Support and encouragement provided. Frequent verbal contact made. Routine safety checks conducted q15 minutes.   R: No adverse drug reactions noted. Pt verbally contracts for safety at this time. Pt compliant with medications. Pt interacts minimally with others on the unit, self isolates to room. Pt remains safe at this time. Will continue to monitor.   Problem: Nutrition: Goal: Adequate nutrition will be maintained Outcome: Not Progressing   Problem: Coping: Goal: Level of anxiety will decrease Outcome: Not Progressing

## 2022-08-08 NOTE — Progress Notes (Signed)
Recreation Therapy Notes   Date: 08/08/2022   Time: 9:40 am     Location: Craft room     Behavioral response: N/A   Intervention Topic: Life Planning   Discussion/Intervention: Patient refused to attend group.    Clinical Observations/Feedback:  Patient refused to attend group.    Blandon Offerdahl LRT/CTRS       Constantino Starace 08/08/2022 10:20 AM

## 2022-08-08 NOTE — Progress Notes (Signed)
Recreation Therapy Notes  INPATIENT RECREATION THERAPY ASSESSMENT  Patient Details Name: Taylor Wiggins MRN: [id number] DOB: Mar 14, 1981 Today's Date: 08/08/2022       Information Obtained From: Chart Review  Able to Participate in Assessment/Interview:    Patient Presentation:    Reason for Admission (Per Patient): Active Symptoms, Suicidal Ideation, Substance Abuse, Suicide Attempt  Patient Stressors: Relationship  Coping Skills:   Substance Abuse, Prayer  Leisure Interests (2+):   (None)  Frequency of Recreation/Participation:    Awareness of Community Resources:     [employer]:     Current Use:    If no, Barriers?:    Expressed Interest in [employer] Information:    [place] of Residence:  [place]  Patient Main Form of Transportation:    Patient Strengths:  N/A  Patient Identified Areas of Improvement:  Anxiety  Patient Goal for Hospitalization:  Getting into a rehab facility  Current SI (including self-harm):     Current HI:     Current AVH:    Staff Intervention Plan: Group Attendance, Collaborate with Interdisciplinary Treatment Team  Consent to Intern Participation: N/A  Iliyana Convey 08/08/2022, 1:28 PM

## 2022-08-09 DIAGNOSIS — F15950 Other stimulant use, unspecified with stimulant-induced psychotic disorder with delusions: Secondary | ICD-10-CM | POA: Diagnosis not present

## 2022-08-09 NOTE — Progress Notes (Signed)
Taylor Wiggins continues to remain isolative to her room.  This staff brought her the scheduled Ensure supplemental drink and inquired if she would like something else to eat for snack. She declined snack this evening, but was encouraged to eat. She has no qhs meds and declined the need for medication to aid in sleep.   For now she denies si/hi/avh. She continues to endorse anxiety and depression and declined the need for medication to help with symptoms.  Support and encouragement offered to seek staff with any concerns  that she may have. Q 15 minute safety checks in place.     C Holden-Nicholson, LPN
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
## 2022-08-09 NOTE — Plan of Care (Signed)
  Problem: Education: Goal: Knowledge of General Education information will improve Description: Including pain rating scale, medication(s)/side effects and non-pharmacologic comfort measures Outcome: Progressing   Problem: Health Behavior/Discharge Planning: Goal: Ability to manage health-related needs will improve Outcome: Progressing   Problem: Clinical Measurements: Goal: Ability to maintain clinical measurements within normal limits will improve Outcome: Progressing Goal: Will remain free from infection Outcome: Progressing Goal: Diagnostic test results will improve Outcome: Progressing Goal: Respiratory complications will improve Outcome: Progressing Goal: Cardiovascular complication will be avoided Outcome: Progressing   Problem: Self-Concept: Goal: Ability to identify factors that promote anxiety will improve Outcome: Progressing Goal: Level of anxiety will decrease Outcome: Progressing Goal: Ability to modify response to factors that promote anxiety will improve Outcome: Progressing   Problem: Coping: Goal: Ability to identify and develop effective coping behavior will improve Outcome: Progressing   Problem: Education: Goal: Ability to state activities that reduce stress will improve Outcome: Progressing
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
## 2022-08-09 NOTE — Group Note (Signed)
[place] LCSW Group Therapy Note   Group Date: 08/09/2022 Start Time: 1300 End Time: 1400  Type of Therapy and Topic:  Group Therapy:  Feelings around Relapse and Recovery  Participation Level:  Did Not Attend    Description of Group:    Patients in this group will discuss emotions they experience before and after a relapse. They will process how experiencing these feelings, or avoidance of experiencing them, relates to having a relapse. Facilitator will guide patients to explore emotions they have related to recovery. Patients will be encouraged to process which emotions are more powerful. They will be guided to discuss the emotional reaction significant others in their lives may have to patients' relapse or recovery. Patients will be assisted in exploring ways to respond to the emotions of others without this contributing to a relapse.  Therapeutic Goals: Patient will identify two or more emotions that lead to relapse for them:  Patient will identify two emotions that result when they relapse:  Patient will identify two emotions related to recovery:  Patient will demonstrate ability to communicate their needs through discussion and/or role plays.   Summary of Patient Progress: Pt did not attend group despite invitation from [place].   Therapeutic Modalities:   Cognitive Behavioral Therapy Solution-Focused Therapy Assertiveness Training Relapse Prevention Therapy   [name], LCSW

## 2022-08-10 DIAGNOSIS — F15950 Other stimulant use, unspecified with stimulant-induced psychotic disorder with delusions: Secondary | ICD-10-CM | POA: Diagnosis not present

## 2022-08-10 LAB — HEPATITIS C ANTIBODY: HCV Ab: REACTIVE — AB

## 2022-08-10 LAB — HIV ANTIBODY (ROUTINE TESTING W REFLEX): HIV Screen 4th Generation wRfx: NONREACTIVE

## 2022-08-10 LAB — RPR: RPR Ser Ql: NONREACTIVE
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
## 2022-08-10 NOTE — Progress Notes (Signed)
[facility] MD Progress Note  08/10/2022 12:30 PM Taylor Wiggins  MRN:  [id number]  Principal Problem: Amphetamine and psychostimulant-induced psychosis with delusions ([place]) Diagnosis: Principal Problem:   Amphetamine and psychostimulant-induced psychosis with delusions ([place]) Active Problems:   Anxiety   Amphetamine use disorder, severe ([place])  Patient is a  41y.o. female who presents to the [facility] unit due to substance use disorder and anxiety.    Interval History Patient was seen today for re-evaluation.  Nursing reports no events overnight. The patient has no issues with performing ADLs.  Patient has been medication compliant.    Subjective:  On assessment patient reports "I am okay. I am just waiting for placement". Reports feeling anxious, but says "I don`t need any medications for that". Denies feeling depressed. Denies suicidal/homicidal ideations,auditory/visual hallucinations. Denies any physical complaints today and any symptoms of withdrawal from illicit substances.    Total Time spent with patient: 30 minutes  Past Psychiatric History: substance use and anxiety  Past Medical History:  Past Medical History:  Diagnosis Date   Adult ADHD    Anxiety    Asthma    IBS (irritable bowel syndrome)    Opioid use disorder    No longer on buprenorphine therapy   PTSD (post-traumatic stress disorder)     Past Surgical History:  Procedure Laterality Date   BREAST REDUCTION SURGERY  2005   Family History: History reviewed. No pertinent family history. Family Psychiatric  History:  Social History:  Social History   Substance and Sexual Activity  Alcohol Use No     Social History   Substance and Sexual Activity  Drug Use Yes   Types: Benzodiazepines, Marijuana, Methamphetamines   Comment: recently    Social History   Socioeconomic History   Marital status: Single    Spouse name: Not on file   Number of children: Not on file   Years of education: Not on file   Highest  education level: Not on file  Occupational History   Not on file  Tobacco Use   Smoking status: Every Day    Packs/day: 1.00    Types: Cigarettes    Last attempt to quit: 09/06/2014    Years since quitting: 7.9   Smokeless tobacco: Never  Substance and Sexual Activity   Alcohol use: No   Drug use: Yes    Types: Benzodiazepines, Marijuana, Methamphetamines    Comment: recently   Sexual activity: Never    Birth control/protection: Abstinence  Other Topics Concern   Not on file  Social History Narrative   41 y/o Caucasian female. Married and homeless with polysubstance abuse. Per pt-Graduated from [employer] in 2006 with bachelors degree in [place].   Social Determinants of Health   Financial Resource Strain: Not on file  Food Insecurity: Not on file  Transportation Needs: Not on file  Physical Activity: Not on file  Stress: Not on file  Social Connections: Not on file   Additional Social History:                         Sleep: Fair  Appetite:  Fair  Current Medications: Current Facility-Administered Medications  Medication Dose Route Frequency Provider Last Rate Last Admin   acetaminophen (TYLENOL) tablet 1,000 mg  1,000 mg Oral Q6H PRN Sherlon Handing, NP       alum & mag hydroxide-simeth (MAALOX/MYLANTA) 200-200-20 MG/5ML suspension 30 mL  30 mL Oral Q4H PRN Sherlon Handing, NP  feeding supplement (ENSURE ENLIVE / ENSURE PLUS) liquid 237 mL  237 mL Oral TID BM Clapacs, John T, MD   237 mL at 08/10/22 1029   gabapentin (NEURONTIN) capsule 300 mg  300 mg Oral Q6H PRN Clapacs, Madie Reno, MD   300 mg at 08/09/22 1114   hydrOXYzine (ATARAX) tablet 50 mg  50 mg Oral Q6H PRN Clapacs, John T, MD   50 mg at 08/10/22 1029   magnesium hydroxide (MILK OF MAGNESIA) suspension 30 mL  30 mL Oral Daily PRN Waldon Merl F, NP       ondansetron (ZOFRAN-ODT) disintegrating tablet 4 mg  4 mg Oral Q8H PRN Clapacs, John T, MD   4 mg at 08/10/22 1029    QUEtiapine (SEROQUEL) tablet 100 mg  100 mg Oral QHS PRN Clapacs, Madie Reno, MD        Lab Results:  Results for orders placed or performed during the hospital encounter of 08/05/22 (from the past 48 hour(s))  HIV Antibody (routine testing w rflx)     Status: None   Collection Time: 08/09/22  5:04 PM  Result Value Ref Range   HIV Screen 4th Generation wRfx Non Reactive Non Reactive    Comment: Performed at [facility], [place] [address], [place], [place] [address]  Hepatitis C antibody     Status: Abnormal   Collection Time: 08/09/22  5:04 PM  Result Value Ref Range   HCV Ab Reactive (A) NON REACTIVE    Comment: (NOTE) The CDC recommends that a Reactive HCV antibody result be followed up  with a HCV Nucleic Acid Amplification test.  Performed at [facility], [place] [address], [place], [place] [address]   RPR     Status: None   Collection Time: 08/09/22  5:04 PM  Result Value Ref Range   RPR Ser Ql NON REACTIVE NON REACTIVE    Comment: Performed at [facility], [place] [address], [place],  [address]    Blood Alcohol level:  Lab Results  Component Value Date   ETH <10 08/03/2022   ETH <10 73/71/0626    Metabolic Disorder Labs: Lab Results  Component Value Date   HGBA1C 5.3 08/06/2022   MPG 105.41 08/06/2022   MPG 108.28 07/06/2022   No results found for: "PROLACTIN" Lab Results  Component Value Date   CHOL 184 08/06/2022   TRIG 169 (H) 08/06/2022   HDL 53 08/06/2022   CHOLHDL 3.5 08/06/2022   VLDL 34 08/06/2022   LDLCALC 97 08/06/2022   LDLCALC 119 (H) 07/06/2022    Physical Findings: AIMS:  , ,  ,  ,    CIWA:    COWS:     Musculoskeletal: Strength & Muscle Tone: within normal limits Gait & Station: normal Patient leans: N/A  Psychiatric Specialty Exam:  Appearance:  CF, appearing stated age, appears well-nourished;  wearing appropriate to the situation casual clothes, with fair grooming and hygiene. Normal level of alertness and  appropriate facial expression.  Attitude/Behavior: calm, cooperative, engaging with appropriate eye contact.  Motor: WNL; dyskinesias not evident. Gait appears in full range.  Speech: spontaneous, clear, coherent, normal comprehension.  Mood: euthymic, " good ".  Affect: appropriately-reactive, restricted.  Thought process: patient appears coherent, organized, logical, goal-directed, associations are appropriate.  Thought content: patient denies suicidal thoughts, denies homicidal thoughts; did not express any delusions.  Thought perception: patient denies auditory and visual hallucinations, no illusions, no depersonalizations. Did not appear internally stimulated.  Cognition:  patient is alert and oriented in self, place, date; with intact abstract, fund of knowledge, attention and concentration.  Insight: fair, in regards of understanding of presence, nature, cause, and significance of mental or emotional problem.  Judgement: fair, in regards of ability to make good decisions concerning the appropriate thing to do in various situations, including ability to form opinions regarding their mental health condition.   Physical Exam: Physical Exam ROS Blood pressure 97/68, pulse 73, temperature 97.8 F (36.6 C), temperature source Oral, resp. rate 18, height '5\' 7"'$  (1.702 m), weight 56.2 kg, last menstrual period 07/22/2022, SpO2 100 %. Body mass index is 19.42 kg/m.   Treatment Plan Summary: Daily contact with patient to assess and evaluate symptoms and progress in treatment and Medication management Patient has said repeatedly that she is not interested in any medicines for anxiety or depression.  She denies having symptoms of opiate withdrawal  She has communicated a desire to have rehab treatment and SW is working on that.     Larita Fife, MD 08/10/2022, 12:30 PM

## 2022-08-10 NOTE — Group Note (Signed)
[facility] LCSW Group Therapy Note   Group Date: 08/10/2022 Start Time: 0950 End Time: 1050   Type of Therapy and Topic: Group Therapy: Avoiding Self-Sabotaging and Enabling Behaviors  Participation Level: Did Not Attend   Description of Group:  In this group, patients will learn how to identify obstacles, self-sabotaging and enabling behaviors, as well as: what are they, why do we do them and what needs these behaviors meet. Discuss unhealthy relationships and how to have positive healthy boundaries with those that sabotage and enable. Explore aspects of self-sabotage and enabling in yourself and how to limit these self-destructive behaviors in everyday life.   Therapeutic Goals: 1. Patient will identify one obstacle that relates to self-sabotage and enabling behaviors 2. Patient will identify one personal self-sabotaging or enabling behavior they did prior to admission 3. Patient will state a plan to change the above identified behavior 4. Patient will demonstrate ability to communicate their needs through discussion and/or role play.    Summary of Patient Progress: X   Therapeutic Modalities:  Cognitive Behavioral Therapy Person-Centered Therapy Motivational Interviewing    [name], LCSW

## 2022-08-10 NOTE — Progress Notes (Signed)
   08/09/22 2000  Psych Admission Type (Psych Patients Only)  Admission Status Voluntary  Psychosocial Assessment  Patient Complaints Anxiety  Eye Contact Brief  Facial Expression Anxious  Affect Blunted  Speech Logical/coherent  Interaction Isolative  Motor Activity Slow  Appearance/Hygiene Unremarkable  Behavior Characteristics Cooperative  Mood Anxious  Aggressive Behavior  Effect No apparent injury  Thought Process  Coherency WDL  Content WDL  Delusions None reported or observed  Perception WDL  Hallucination None reported or observed  Judgment Impaired  Confusion WDL  Danger to Self  Current suicidal ideation? Denies (Denies)  Agreement Not to Harm Self Yes  Description of Agreement verbal

## 2022-08-10 NOTE — Plan of Care (Signed)
D- Patient alert and oriented. Patient presented in an anxious, but pleasant mood on assessment stating that she didn't sleep good at all last night, reporting that she usually takes Vistaril at night, but she didn't last night, and is the reason she was trying to get some more rest this morning. Patient endorsed both depression and anxiety, stating that her "future plans" is what has her feeling this way. Patient denied SI, HI, AVH, and pain, stating "not right now, no". Patient's goal for today is to "go outside", in which she did briefly this afternoon, with staff and other members on the unit.  A- Scheduled medications administered to patient, per MD orders. Support and encouragement provided.  Routine safety checks conducted every 15 minutes.  Patient informed to notify staff with problems or concerns.  R- No adverse drug reactions noted. Patient contracts for safety at this time. Patient compliant with medications. Patient receptive, calm, and cooperative. Patient interacts well with others on the unit. Patient remains safe at this time.  Problem: Education: Goal: Knowledge of General Education information will improve Description: Including pain rating scale, medication(s)/side effects and non-pharmacologic comfort measures Outcome: Progressing   Problem: Health Behavior/Discharge Planning: Goal: Ability to manage health-related needs will improve Outcome: Progressing   Problem: Clinical Measurements: Goal: Ability to maintain clinical measurements within normal limits will improve Outcome: Progressing Goal: Will remain free from infection Outcome: Progressing Goal: Diagnostic test results will improve Outcome: Progressing Goal: Respiratory complications will improve Outcome: Progressing Goal: Cardiovascular complication will be avoided Outcome: Progressing   Problem: Activity: Goal: Risk for activity intolerance will decrease Outcome: Progressing   Problem: Nutrition: Goal:  Adequate nutrition will be maintained Outcome: Progressing   Problem: Coping: Goal: Level of anxiety will decrease Outcome: Progressing   Problem: Elimination: Goal: Will not experience complications related to bowel motility Outcome: Progressing Goal: Will not experience complications related to urinary retention Outcome: Progressing   Problem: Pain Managment: Goal: General experience of comfort will improve Outcome: Progressing   Problem: Safety: Goal: Ability to remain free from injury will improve Outcome: Progressing   Problem: Skin Integrity: Goal: Risk for impaired skin integrity will decrease Outcome: Progressing   Problem: Education: Goal: Knowledge of [place] [facility] Education information/materials will improve Outcome: Progressing Goal: Emotional status will improve Outcome: Progressing Goal: Mental status will improve Outcome: Progressing Goal: Verbalization of understanding the information provided will improve Outcome: Progressing   Problem: Activity: Goal: Interest or engagement in activities will improve Outcome: Progressing Goal: Sleeping patterns will improve Outcome: Progressing   Problem: Coping: Goal: Ability to verbalize frustrations and anger appropriately will improve Outcome: Progressing Goal: Ability to demonstrate self-control will improve Outcome: Progressing   Problem: Health Behavior/Discharge Planning: Goal: Identification of resources available to assist in meeting health care needs will improve Outcome: Progressing Goal: Compliance with treatment plan for underlying cause of condition will improve Outcome: Progressing   Problem: Physical Regulation: Goal: Ability to maintain clinical measurements within normal limits will improve Outcome: Progressing   Problem: Safety: Goal: Periods of time without injury will increase Outcome: Progressing   Problem: Education: Goal: Ability to state activities that reduce stress will  improve Outcome: Progressing   Problem: Coping: Goal: Ability to identify and develop effective coping behavior will improve Outcome: Progressing   Problem: Self-Concept: Goal: Ability to identify factors that promote anxiety will improve Outcome: Progressing Goal: Level of anxiety will decrease Outcome: Progressing Goal: Ability to modify response to factors that promote anxiety will improve Outcome: Progressing

## 2022-08-11 DIAGNOSIS — F15950 Other stimulant use, unspecified with stimulant-induced psychotic disorder with delusions: Secondary | ICD-10-CM | POA: Diagnosis not present

## 2022-08-11 NOTE — Progress Notes (Signed)
[facility] MD Progress Note  08/11/2022 12:08 PM Taylor Wiggins  MRN:  [id number]  Principal Problem: Amphetamine and psychostimulant-induced psychosis with delusions ([place]) Diagnosis: Principal Problem:   Amphetamine and psychostimulant-induced psychosis with delusions ([place]) Active Problems:   Anxiety   Amphetamine use disorder, severe ([place])  Patient is a  41y.o. female who presents to the [facility] unit due to substance use disorder and anxiety.    Interval History Patient was seen today for re-evaluation.  Nursing reports no events overnight. The patient has no issues with performing ADLs.  Patient has been medication compliant.    Subjective:  On assessment patient reports "I am okay. I am tired of being here. I hope I can leave tomorrow". Denies feeling depressed. Denies suicidal/homicidal ideations,auditory/visual hallucinations. Reports feeling anxious chronically, but says "I don`t need any medications you are offering me here for that".  Denies any physical complaints today and any symptoms of withdrawal from illicit substances.    Total Time spent with patient: 30 minutes  Past Psychiatric History: substance use and anxiety  Past Medical History:  Past Medical History:  Diagnosis Date   Adult ADHD    Anxiety    Asthma    IBS (irritable bowel syndrome)    Opioid use disorder    No longer on buprenorphine therapy   PTSD (post-traumatic stress disorder)     Past Surgical History:  Procedure Laterality Date   BREAST REDUCTION SURGERY  2005   Family History: History reviewed. No pertinent family history. Family Psychiatric  History:  Social History:  Social History   Substance and Sexual Activity  Alcohol Use No     Social History   Substance and Sexual Activity  Drug Use Yes   Types: Benzodiazepines, Marijuana, Methamphetamines   Comment: recently    Social History   Socioeconomic History   Marital status: Single    Spouse name: Not on file   Number of children:  Not on file   Years of education: Not on file   Highest education level: Not on file  Occupational History   Not on file  Tobacco Use   Smoking status: Every Day    Packs/day: 1.00    Types: Cigarettes    Last attempt to quit: 09/06/2014    Years since quitting: 7.9   Smokeless tobacco: Never  Substance and Sexual Activity   Alcohol use: No   Drug use: Yes    Types: Benzodiazepines, Marijuana, Methamphetamines    Comment: recently   Sexual activity: Never    Birth control/protection: Abstinence  Other Topics Concern   Not on file  Social History Narrative   41 y/o Caucasian female. Married and homeless with polysubstance abuse. Per pt-Graduated from [employer] in 2006 with bachelors degree in [place].   Social Determinants of Health   Financial Resource Strain: Not on file  Food Insecurity: Not on file  Transportation Needs: Not on file  Physical Activity: Not on file  Stress: Not on file  Social Connections: Not on file   Additional Social History:                         Sleep: Fair  Appetite:  Fair  Current Medications: Current Facility-Administered Medications  Medication Dose Route Frequency Provider Last Rate Last Admin   acetaminophen (TYLENOL) tablet 1,000 mg  1,000 mg Oral Q6H PRN Sherlon Handing, NP       alum & mag hydroxide-simeth (MAALOX/MYLANTA) 200-200-20 MG/5ML suspension  30 mL  30 mL Oral Q4H PRN Waldon Merl F, NP       feeding supplement (ENSURE ENLIVE / ENSURE PLUS) liquid 237 mL  237 mL Oral TID BM Clapacs, John T, MD   237 mL at 08/11/22 0903   gabapentin (NEURONTIN) capsule 300 mg  300 mg Oral Q6H PRN Clapacs, Madie Reno, MD   300 mg at 08/10/22 1517   hydrOXYzine (ATARAX) tablet 50 mg  50 mg Oral Q6H PRN Clapacs, John T, MD   50 mg at 08/11/22 1324   magnesium hydroxide (MILK OF MAGNESIA) suspension 30 mL  30 mL Oral Daily PRN Waldon Merl F, NP       ondansetron (ZOFRAN-ODT) disintegrating tablet 4 mg  4 mg  Oral Q8H PRN Clapacs, Madie Reno, MD   4 mg at 08/11/22 4010   QUEtiapine (SEROQUEL) tablet 100 mg  100 mg Oral QHS PRN Clapacs, Madie Reno, MD        Lab Results:  Results for orders placed or performed during the hospital encounter of 08/05/22 (from the past 48 hour(s))  HIV Antibody (routine testing w rflx)     Status: None   Collection Time: 08/09/22  5:04 PM  Result Value Ref Range   HIV Screen 4th Generation wRfx Non Reactive Non Reactive    Comment: Performed at [facility], [place] [address], [place], [place] [address]  Hepatitis C antibody     Status: Abnormal   Collection Time: 08/09/22  5:04 PM  Result Value Ref Range   HCV Ab Reactive (A) NON REACTIVE    Comment: (NOTE) The CDC recommends that a Reactive HCV antibody result be followed up  with a HCV Nucleic Acid Amplification test.  Performed at [facility], [place] [address], [place], [place] [address]   RPR     Status: None   Collection Time: 08/09/22  5:04 PM  Result Value Ref Range   RPR Ser Ql NON REACTIVE NON REACTIVE    Comment: Performed at [facility], [place] [address] [place], [place] [address]    Blood Alcohol level:  Lab Results  Component Value Date   ETH <10 08/03/2022   ETH <10 59/56/3875    Metabolic Disorder Labs: Lab Results  Component Value Date   HGBA1C 5.3 08/06/2022   MPG 105.41 08/06/2022   MPG 108.28 07/06/2022   No results found for: "PROLACTIN" Lab Results  Component Value Date   CHOL 184 08/06/2022   TRIG 169 (H) 08/06/2022   HDL 53 08/06/2022   CHOLHDL 3.5 08/06/2022   VLDL 34 08/06/2022   LDLCALC 97 08/06/2022   LDLCALC 119 (H) 07/06/2022    Physical Findings: AIMS:  , ,  ,  ,    CIWA:    COWS:     Musculoskeletal: Strength & Muscle Tone: within normal limits Gait & Station: normal Patient leans: N/A  Psychiatric Specialty Exam:  Appearance:  CF, appearing stated age, appears well-nourished;  wearing appropriate to the situation casual clothes, with  fair grooming and hygiene. Normal level of alertness and appropriate facial expression.  Attitude/Behavior: calm, cooperative, engaging with appropriate eye contact.  Motor: WNL; dyskinesias not evident. Gait appears in full range.  Speech: spontaneous, clear, coherent, normal comprehension.  Mood: euthymic, " good ".  Affect: appropriately-reactive, restricted.  Thought process: patient appears coherent, organized, logical, goal-directed, associations are appropriate.  Thought content: patient denies suicidal thoughts, denies homicidal thoughts; did not express any delusions.  Thought  perception: patient denies auditory and visual hallucinations, no illusions, no depersonalizations. Did not appear internally stimulated.  Cognition: patient is alert and oriented in self, place, date; with intact abstract, fund of knowledge, attention and concentration.  Insight: fair, in regards of understanding of presence, nature, cause, and significance of mental or emotional problem.  Judgement: fair, in regards of ability to make good decisions concerning the appropriate thing to do in various situations, including ability to form opinions regarding their mental health condition.   Physical Exam: Physical Exam ROS Blood pressure 91/73, pulse 75, temperature 98.7 F (37.1 C), temperature source Oral, resp. rate (!) 1, height '5\' 7"'$  (1.702 m), weight 56.2 kg, last menstrual period 07/22/2022, SpO2 100 %. Body mass index is 19.42 kg/m.   Treatment Plan Summary: Daily contact with patient to assess and evaluate symptoms and progress in treatment and Medication management Patient has said repeatedly that she is not interested in any medicines for anxiety or depression.  She denies having symptoms of opiate withdrawal  She has communicated a desire to have rehab treatment and SW is working on that.     Larita Fife, MD 08/11/2022, 12:08 PM

## 2022-08-11 NOTE — Progress Notes (Signed)
No distress noted she denies SI/HI/AVH, her thoughts are organized, she isolates to self no distress noted, 15 minutes safety checks maintained.

## 2022-08-11 NOTE — Plan of Care (Signed)
D: Pt alert and oriented. Pt endorses experiencing anxiety/depression at this time. Pt denies experiencing any pain at this time. Pt denies experiencing any SI/HI, or AVH at this time.   Pt had been observed in milieu outside of meals for short periods of time.  A: Scheduled medications administered to pt, per MD orders. Support and encouragement provided. Frequent verbal contact made. Routine safety checks conducted q15 minutes.   R: No adverse drug reactions noted. Pt verbally contracts for safety at this time. Pt compliant with medications. Pt interacts minimally with others on the unit, mostly self isolating to room. Pt remains safe at this time. Plan of care ongoing.   Problem: Education: Goal: Knowledge of General Education information will improve Description: Including pain rating scale, medication(s)/side effects and non-pharmacologic comfort measures Outcome: Progressing   Problem: Nutrition: Goal: Adequate nutrition will be maintained Outcome: Progressing

## 2022-08-12 DIAGNOSIS — F15950 Other stimulant use, unspecified with stimulant-induced psychotic disorder with delusions: Secondary | ICD-10-CM | POA: Diagnosis not present

## 2022-08-12 MED ORDER — QUETIAPINE FUMARATE 100 MG PO TABS
100.0000 mg | ORAL_TABLET | Freq: Every evening | ORAL | 1 refills | Status: DC | PRN
Start: 2022-08-12 — End: 2023-03-18

## 2022-08-12 MED ORDER — GABAPENTIN 300 MG PO CAPS: 300.0000 mg | ORAL_CAPSULE | Freq: Four times a day (QID) | ORAL | 1 refills | Status: DC | PRN

## 2022-08-12 NOTE — Discharge Summary (Signed)
Physician Discharge Summary Note  Patient:  Taylor Wiggins is an 41 y.o., female MRN:  [id number] DOB:  1981-07-16 Patient phone:  [phone] (home)  Patient address:   [place] [address],  Total Time spent with patient: 30 minutes  Date of Admission:  08/05/2022 Date of Discharge: 08/12/2022  Reason for Admission: Admitted after presenting with paranoia and agitation and confusion in the context of substance abuse  Principal Problem: Amphetamine and psychostimulant-induced psychosis with delusions [facility]) Discharge Diagnoses: Principal Problem:   Amphetamine and psychostimulant-induced psychosis with delusions ([place]) Active Problems:   Anxiety   Amphetamine use disorder, severe ([place])   Past Psychiatric History: Past history of substance abuse opiates and amphetamines in particular  Past Medical History:  Past Medical History:  Diagnosis Date   Adult ADHD    Anxiety    Asthma    IBS (irritable bowel syndrome)    Opioid use disorder    No longer on buprenorphine therapy   PTSD (post-traumatic stress disorder)     Past Surgical History:  Procedure Laterality Date   BREAST REDUCTION SURGERY  2005   Family History: History reviewed. No pertinent family history. Family Psychiatric  History: See previous Social History:  Social History   Substance and Sexual Activity  Alcohol Use No     Social History   Substance and Sexual Activity  Drug Use Yes   Types: Benzodiazepines, Marijuana, Methamphetamines   Comment: recently    Social History   Socioeconomic History   Marital status: Single    Spouse name: Not on file   Number of children: Not on file   Years of education: Not on file   Highest education level: Not on file  Occupational History   Not on file  Tobacco Use   Smoking status: Every Day    Packs/day: 1.00    Types: Cigarettes    Last attempt to quit: 09/06/2014    Years since quitting: 7.9   Smokeless tobacco: Never   Substance and Sexual Activity   Alcohol use: No   Drug use: Yes    Types: Benzodiazepines, Marijuana, Methamphetamines    Comment: recently   Sexual activity: Never    Birth control/protection: Abstinence  Other Topics Concern   Not on file  Social History Narrative   41 y/o Caucasian female. Married and homeless with polysubstance abuse. Per pt-Graduated from [employer] in 2006 with bachelors degree in [place].   Social Determinants of Health   Financial Resource Strain: Not on file  Food Insecurity: Not on file  Transportation Needs: Not on file  Physical Activity: Not on file  Stress: Not on file  Social Connections: Not on file    Hospital Course: Admitted to psychiatric unit.  15-minute checks continued.  Patient did not show any dangerous aggressive violent or suicidal behavior.  She mostly stayed to herself remained isolated for a lot of the time she was here.  She had several days of symptoms that were typical of opiate withdrawal although the patient denied that she had been abusing opiates prior to admission.  By the day of discharge she had improved significantly and said she is feeling much better.  No longer complaining of physical distress.  No longer feeling paranoid or believing that people are trying to break into her mother's house.  Acknowledges the substance use issue and agrees to recommendations for outpatient follow-up  Physical Findings: AIMS:  , ,  ,  ,    CIWA:  COWS:     Musculoskeletal: Strength & Muscle Tone: within normal limits Gait & Station: normal Patient leans: N/A   Psychiatric Specialty Exam:  Presentation  General Appearance: Appropriate for Environment; Disheveled  Eye Contact:Fair  Speech:Clear and Coherent  Speech Volume:Decreased  Handedness:Right   Mood and Affect  Mood:Depressed; Dysphoric; Hopeless; Worthless  Affect:Congruent   Thought Process  Thought Processes:Goal Directed  Descriptions  of Associations:Intact  Orientation:Full (Time, Place and Person)  Thought Content:Logical; WDL  History of Schizophrenia/Schizoaffective disorder:No  Duration of Psychotic Symptoms:N/A  Hallucinations:No data recorded Ideas of Reference:None  Suicidal Thoughts:No data recorded Homicidal Thoughts:No data recorded  Sensorium  Memory:Immediate Fair  Judgment:Impaired  Insight:Lacking   Executive Functions  Concentration:Fair  Attention Span:Fair  [place]   Psychomotor Activity  Psychomotor Activity:No data recorded  Assets  Assets:Communication Skills; Desire for Improvement; Housing   Sleep  Sleep:No data recorded   Physical Exam: Physical Exam Vitals and nursing note reviewed.  Constitutional:      Appearance: Normal appearance.  HENT:     Head: Normocephalic and atraumatic.     Mouth/Throat:     Pharynx: Oropharynx is clear.  Eyes:     Pupils: Pupils are equal, round, and reactive to light.  Cardiovascular:     Rate and Rhythm: Normal rate and regular rhythm.  Pulmonary:     Effort: Pulmonary effort is normal.     Breath sounds: Normal breath sounds.  Abdominal:     General: Abdomen is flat.     Palpations: Abdomen is soft.  Musculoskeletal:        General: Normal range of motion.  Skin:    General: Skin is warm and dry.  Neurological:     General: No focal deficit present.     Mental Status: She is alert. Mental status is at baseline.  Psychiatric:        Attention and Perception: Attention normal.        Mood and Affect: Mood normal.        Speech: Speech normal.        Behavior: Behavior is cooperative.        Thought Content: Thought content normal.        Cognition and Memory: Cognition normal.        Judgment: Judgment normal.    Review of Systems  Constitutional: Negative.   HENT: Negative.    Eyes: Negative.   Respiratory: Negative.    Cardiovascular: Negative.    Gastrointestinal: Negative.   Musculoskeletal: Negative.   Skin: Negative.   Neurological: Negative.   Psychiatric/Behavioral: Negative.     Blood pressure (!) 87/64, pulse 80, temperature 98.2 F (36.8 C), temperature source Oral, resp. rate (!) 1, height '5\' 7"'$  (1.702 m), weight 56.2 kg, last menstrual period 07/22/2022, SpO2 98 %. Body mass index is 19.42 kg/m.   Social History   Tobacco Use  Smoking Status Every Day   Packs/day: 1.00   Types: Cigarettes   Last attempt to quit: 09/06/2014   Years since quitting: 7.9  Smokeless Tobacco Never   Tobacco Cessation:  A prescription for an FDA-approved tobacco cessation medication was offered at discharge and the patient refused   Blood Alcohol level:  Lab Results  Component Value Date   [facility] <10 08/03/2022   ETH <10 66/29/4765    Metabolic Disorder Labs:  Lab Results  Component Value Date   HGBA1C 5.3 08/06/2022   MPG 105.41 08/06/2022   MPG 108.28 07/06/2022  No results found for: "PROLACTIN" Lab Results  Component Value Date   CHOL 184 08/06/2022   TRIG 169 (H) 08/06/2022   HDL 53 08/06/2022   CHOLHDL 3.5 08/06/2022   VLDL 34 08/06/2022   LDLCALC 97 08/06/2022   LDLCALC 119 (H) 07/06/2022    See Psychiatric Specialty Exam and Suicide Risk Assessment completed by Attending Physician prior to discharge.  Discharge destination:  Home  Is patient on multiple antipsychotic therapies at discharge:  No   Has Patient had three or more failed trials of antipsychotic monotherapy by history:  No  Recommended Plan for Multiple Antipsychotic Therapies: NA  Discharge Instructions     Diet - low sodium heart healthy   Complete by: As directed    Increase activity slowly   Complete by: As directed       Allergies as of 08/12/2022       Reactions   Tramadol Other (See Comments)   Seizures   Mold Extract [trichophyton] Other (See Comments)   Sneezing        Medication List     STOP taking these  medications    amphetamine-dextroamphetamine 30 MG tablet Commonly known as: ADDERALL   atomoxetine 40 MG capsule Commonly known as: STRATTERA   clonazePAM 1 MG tablet Commonly known as: KLONOPIN   lamoTRIgine 25 MG tablet Commonly known as: LAMICTAL   omeprazole 20 MG capsule Commonly known as: PRILOSEC       TAKE these medications      Indication  gabapentin 300 MG capsule Commonly known as: NEURONTIN Take 1 capsule (300 mg total) by mouth every 6 (six) hours as needed (Anxiety if not relieved by hydroxyzine or if this medicine is preferred).  Indication: Anxiety   QUEtiapine 100 MG tablet Commonly known as: SEROQUEL Take 1 tablet (100 mg total) by mouth at bedtime as needed (Sleep).  Indication: Major Depressive Disorder         Follow-up recommendations: Continue current medications without restarting controlled substances.  Follow up with recommended outpatient mental health treatment  Comments: Strongly encouraged substance abuse treatment  Signed: Alethia Berthold, MD 08/12/2022, 10:32 AM

## 2022-08-12 NOTE — Progress Notes (Signed)
D: Pt alert and oriented. Pt denies experiencing any pain, SI/HI, or AVH at this time. Pt reports she will be able to keep herself safe when she returns home.   A: Pt received discharge and medication education/information. Pt belongings were returned and signed for at this time.   R: Pt verbalized understanding of discharge and medication education/information.  Pt escorted by staff to ED lobby where pt was picked up by female friend.

## 2022-08-12 NOTE — BHH Suicide Risk Assessment (Signed)
[facility] Discharge Suicide Risk Assessment   Principal Problem: Amphetamine and psychostimulant-induced psychosis with delusions ([place]) Discharge Diagnoses: Principal Problem:   Amphetamine and psychostimulant-induced psychosis with delusions ([place]) Active Problems:   Anxiety   Amphetamine use disorder, severe (HCC)   Total Time spent with patient: 30 minutes  Musculoskeletal: Strength & Muscle Tone: within normal limits Gait & Station: normal Patient leans: N/A  Psychiatric Specialty Exam  Presentation  General Appearance: Appropriate for Environment; Disheveled  Eye Contact:Fair  Speech:Clear and Coherent  Speech Volume:Decreased  Handedness:Right   Mood and Affect  Mood:Depressed; Dysphoric; Hopeless; Worthless  Duration of Depression Symptoms: Greater than two weeks  Affect:Congruent   Thought Process  Thought Processes:Goal Directed  Descriptions of Associations:Intact  Orientation:Full (Time, Place and Person)  Thought Content:Logical; WDL  History of Schizophrenia/Schizoaffective disorder:No  Duration of Psychotic Symptoms:N/A  Hallucinations:No data recorded Ideas of Reference:None  Suicidal Thoughts:No data recorded Homicidal Thoughts:No data recorded  Sensorium  Memory:Immediate Fair  Judgment:Impaired  Insight:Lacking   Executive Functions  Concentration:Fair  Attention Span:Fair  [place]   Psychomotor Activity  Psychomotor Activity:No data recorded  Assets  Assets:Communication Skills; Desire for Improvement; Housing   Sleep  Sleep:No data recorded  Physical Exam: Physical Exam Vitals and nursing note reviewed.  Constitutional:      Appearance: Normal appearance.  HENT:     Head: Normocephalic and atraumatic.     Mouth/Throat:     Pharynx: Oropharynx is clear.  Eyes:     Pupils: Pupils are equal, round, and reactive to light.  Cardiovascular:     Rate and Rhythm: Normal  rate and regular rhythm.  Pulmonary:     Effort: Pulmonary effort is normal.     Breath sounds: Normal breath sounds.  Abdominal:     General: Abdomen is flat.     Palpations: Abdomen is soft.  Musculoskeletal:        General: Normal range of motion.  Skin:    General: Skin is warm and dry.  Neurological:     General: No focal deficit present.     Mental Status: She is alert. Mental status is at baseline.  Psychiatric:        Attention and Perception: Attention normal.        Mood and Affect: Mood normal.        Speech: Speech normal.        Behavior: Behavior is cooperative.        Thought Content: Thought content normal.        Cognition and Memory: Cognition normal.        Judgment: Judgment normal.    Review of Systems  Constitutional: Negative.   HENT: Negative.    Eyes: Negative.   Respiratory: Negative.    Cardiovascular: Negative.   Gastrointestinal: Negative.   Musculoskeletal: Negative.   Skin: Negative.   Neurological: Negative.   Psychiatric/Behavioral: Negative.     Blood pressure (!) 87/64, pulse 80, temperature 98.2 F (36.8 C), temperature source Oral, resp. rate (!) 1, height '5\' 7"'$  (1.702 m), weight 56.2 kg, last menstrual period 07/22/2022, SpO2 98 %. Body mass index is 19.42 kg/m.  Mental Status Per Nursing Assessment::   On Admission:  NA  Demographic Factors:  Caucasian and Low socioeconomic status  Loss Factors: Financial problems/change in socioeconomic status  Historical Factors: Impulsivity  Risk Reduction Factors:   Sense of responsibility to family and Living with another person, especially a relative  Continued Clinical Symptoms:  Alcohol/Substance Abuse/Dependencies  Cognitive Features That Contribute To Risk:  Thought constriction (tunnel vision)    Suicide Risk:  Minimal: No identifiable suicidal ideation.  Patients presenting with no risk factors but with morbid ruminations; may be classified as minimal risk based on the  severity of the depressive symptoms    Plan Of Care/Follow-up recommendations:  Follow-up strongly encouraged with outpatient mental health treatment especially for substance abuse treatment.  Patient is now lucid calm and alert and denies any suicidal thought at all.  Alethia Berthold, MD 08/12/2022, 10:29 AM

## 2022-08-12 NOTE — BH IP Treatment Plan (Signed)
Interdisciplinary Treatment and Diagnostic Plan Update  08/12/2022 Time of Session: 8:30 AM Taylor Wiggins MRN: [id number]  Principal Diagnosis: Amphetamine and psychostimulant-induced psychosis with delusions ([place])  Secondary Diagnoses: Principal Problem:   Amphetamine and psychostimulant-induced psychosis with delusions ([place]) Active Problems:   Anxiety   Amphetamine use disorder, severe (HCC)   Current Medications:  Current Facility-Administered Medications  Medication Dose Route Frequency Provider Last Rate Last Admin   acetaminophen (TYLENOL) tablet 1,000 mg  1,000 mg Oral Q6H PRN Sherlon Handing, NP       alum & mag hydroxide-simeth (MAALOX/MYLANTA) 200-200-20 MG/5ML suspension 30 mL  30 mL Oral Q4H PRN Barthold, Louise F, NP       feeding supplement (ENSURE ENLIVE / ENSURE PLUS) liquid 237 mL  237 mL Oral TID BM Clapacs, John T, MD   237 mL at 08/12/22 0947   gabapentin (NEURONTIN) capsule 300 mg  300 mg Oral Q6H PRN Clapacs, John T, MD   300 mg at 08/11/22 1353   hydrOXYzine (ATARAX) tablet 50 mg  50 mg Oral Q6H PRN Clapacs, John T, MD   50 mg at 08/11/22 1827   magnesium hydroxide (MILK OF MAGNESIA) suspension 30 mL  30 mL Oral Daily PRN Waldon Merl F, NP       ondansetron (ZOFRAN-ODT) disintegrating tablet 4 mg  4 mg Oral Q8H PRN Clapacs, John T, MD   4 mg at 08/11/22 1827   QUEtiapine (SEROQUEL) tablet 100 mg  100 mg Oral QHS PRN Clapacs, Madie Reno, MD       PTA Medications: Medications Prior to Admission  Medication Sig Dispense Refill Last Dose   amphetamine-dextroamphetamine (ADDERALL) 30 MG tablet Take 1 tablet by mouth 2 (two) times daily.      atomoxetine (STRATTERA) 40 MG capsule Take 1 capsule (40 mg total) by mouth daily. 30 capsule 0    clonazePAM (KLONOPIN) 1 MG tablet Take 1 mg by mouth daily as needed for anxiety.      lamoTRIgine (LAMICTAL) 25 MG tablet Take 1 tablet (25 mg total) by mouth daily. 30 tablet 0    omeprazole (PRILOSEC) 20 MG capsule Take 1  capsule (20 mg total) by mouth daily. (Patient taking differently: Take 20 mg by mouth daily as needed (For heartburn or acid reflux).) 30 capsule 0     Patient Stressors: Marital or family conflict    Patient Strengths: Motivation for treatment/growth   Treatment Modalities: Medication Management, Group therapy, Case management,  1 to 1 session with clinician, Psychoeducation, Recreational therapy.   Physician Treatment Plan for Primary Diagnosis: Amphetamine and psychostimulant-induced psychosis with delusions ([place]) Long Term Goal(s): Improvement in symptoms so as ready for discharge   Short Term Goals: Ability to identify triggers associated with substance abuse/mental health issues will improve Ability to verbalize feelings will improve Ability to disclose and discuss suicidal ideas Ability to demonstrate self-control will improve  Medication Management: Evaluate patient's response, side effects, and tolerance of medication regimen.  Therapeutic Interventions: 1 to 1 sessions, Unit Group sessions and Medication administration.  Evaluation of Outcomes: Adequate for Discharge  Physician Treatment Plan for Secondary Diagnosis: Principal Problem:   Amphetamine and psychostimulant-induced psychosis with delusions ([place]) Active Problems:   Anxiety   Amphetamine use disorder, severe (HCC)  Long Term Goal(s): Improvement in symptoms so as ready for discharge   Short Term Goals: Ability to identify triggers associated with substance abuse/mental health issues will improve Ability to verbalize feelings will improve Ability to disclose and discuss suicidal  ideas Ability to demonstrate self-control will improve     Medication Management: Evaluate patient's response, side effects, and tolerance of medication regimen.  Therapeutic Interventions: 1 to 1 sessions, Unit Group sessions and Medication administration.  Evaluation of Outcomes: Adequate for Discharge   RN Treatment Plan for  Primary Diagnosis: Amphetamine and psychostimulant-induced psychosis with delusions ([place]) Long Term Goal(s): Knowledge of disease and therapeutic regimen to maintain health will improve  Short Term Goals: Ability to remain free from injury will improve, Ability to verbalize frustration and anger appropriately will improve, Ability to demonstrate self-control, Ability to participate in decision making will improve, Ability to verbalize feelings will improve, Ability to disclose and discuss suicidal ideas, Ability to identify and develop effective coping behaviors will improve, and Compliance with prescribed medications will improve  Medication Management: RN will administer medications as ordered by provider, will assess and evaluate patient's response and provide education to patient for prescribed medication. RN will report any adverse and/or side effects to prescribing provider.  Therapeutic Interventions: 1 on 1 counseling sessions, Psychoeducation, Medication administration, Evaluate responses to treatment, Monitor vital signs and CBGs as ordered, Perform/monitor CIWA, COWS, AIMS and Fall Risk screenings as ordered, Perform wound care treatments as ordered.  Evaluation of Outcomes: Adequate for Discharge   LCSW Treatment Plan for Primary Diagnosis: Amphetamine and psychostimulant-induced psychosis with delusions ([place]) Long Term Goal(s): Safe transition to appropriate next level of care at discharge, Engage patient in therapeutic group addressing interpersonal concerns.  Short Term Goals: Engage patient in aftercare planning with referrals and resources, Increase social support, Increase ability to appropriately verbalize feelings, Increase emotional regulation, Facilitate acceptance of mental health diagnosis and concerns, Facilitate patient progression through stages of change regarding substance use diagnoses and concerns, Identify triggers associated with mental health/substance abuse issues,  and Increase skills for wellness and recovery  Therapeutic Interventions: Assess for all discharge needs, 1 to 1 time with Social worker, Explore available resources and support systems, Assess for adequacy in community support network, Educate family and significant other(s) on suicide prevention, Complete Psychosocial Assessment, Interpersonal group therapy.  Evaluation of Outcomes: Adequate for Discharge   Progress in Treatment: Attending groups: No. Participating in groups: No. Taking medication as prescribed: Yes. Toleration medication: Yes. Family/Significant other contact made: Yes, individual(s) contacted:  SPE completed with mother. Patient understands diagnosis: Yes. Discussing patient identified problems/goals with staff: Yes. Medical problems stabilized or resolved: Yes. Denies suicidal/homicidal ideation: Yes. Issues/concerns per patient self-inventory: No. Other: none.   New problem(s) identified: No, Describe:  none identified. Update 08/12/22: No changes at this time.   New Short Term/Long Term Goal(s): detox, medication management for mood stabilization; elimination of SI thoughts; development of comprehensive mental wellness/sobriety plan. Update 08/12/22: No changes at this time.   Patient Goals:  "Getting into a rehab facility and getting my anxiety under control." Update 08/12/22: pt is electing to return to community without getting into a rehab facility.   Discharge Plan or Barriers: CSW will assist pt with development of an appropriate aftercare/discharge plan. Update 08/12/22: She endorses plans to return to her mother's home and ex to provide transportation. Pt declined scheduled outpatient care, smoking cessation, and substance use treatment. She will be given resource list for substance use treatment per request.   Reason for Continuation of Hospitalization: Anxiety Depression Medication stabilization Suicidal ideation Withdrawal symptoms   Estimated Length  of Stay: 1-7 days Update 08/12/22: No changes at this time.  Last 3 [place] Suicide Severity Risk Score: [place] Admission (  Current) from 08/05/2022 in [place] ED from 08/03/2022 in [place] ED from 07/19/2022 in [place] High Risk High Risk High Risk       Last [facility] 2/9 Scores:    07/19/2022    8:06 PM 07/06/2022    9:49 AM  Depression screen PHQ 2/9  Decreased Interest 2 2  Down, Depressed, Hopeless 3 1  PHQ - 2 Score 5 3  Altered sleeping 2 2  Tired, decreased energy 2 2  Change in appetite 1 1  Feeling bad or failure about yourself  3 2  Trouble concentrating 2 1  Moving slowly or fidgety/restless 0 0  Suicidal thoughts 1 1  PHQ-9 Score 16 12  Difficult doing work/chores Extremely dIfficult Somewhat difficult    Scribe for Treatment Team: [name], LCSW 08/12/2022 10:46 AM

## 2022-08-12 NOTE — Progress Notes (Signed)
Recreation Therapy Notes   Date: 08/12/2022   Time: 10:45 am     Location: Courtyard  Behavioral response: Appropriate  Intervention Topic:  Wellness   Discussion/Intervention:  Group content today was focused on Wellness. The group defined wellness and some positive ways they make decisions for themselves. Individuals expressed reasons why they neglected any wellness in the past. Patients described ways to improve wellness skills in the future. The group explained what could happen if they did not do any wellness at all. Participants express how bad choices has affected them and others around them. Individual explained the importance of wellness. The group participated in the intervention "Testing my Wellness" where they had a chance to identify some of their weaknesses and strengths in wellness.  Clinical Observations/Feedback: Patient came to group and was appropriate and engaged while focusing on group content. Individual was social with peers and staff while participating in the intervention.  Dazha Kempa LRT/CTRS        Bijon Mineer 08/12/2022 12:14 PM

## 2022-08-12 NOTE — Progress Notes (Signed)
Recreation Therapy Notes  INPATIENT RECREATION TR PLAN  Patient Details Name: Taylor Wiggins MRN: [id number] DOB: December 20, 1981 Today's Date: 08/12/2022  Rec Therapy Plan Is patient appropriate for Therapeutic Recreation?: Yes Treatment times per week: at least 3 Estimated Length of Stay: 5-7 days TR Treatment/Interventions: Group participation (Comment)  Discharge Criteria Pt will be discharged from therapy if:: Discharged Treatment plan/goals/alternatives discussed and agreed upon by:: Patient/family  Discharge Summary Short term goals set: Patient will engage in groups without prompting or encouragement from LRT x3 group sessions within 5 recreation therapy group sessions Short term goals met: Not met Progress toward goals comments: Groups attended Which groups?: Wellness Reason goals not met: Patient spent most of her time in her room Therapeutic equipment acquired: N/A Reason patient discharged from therapy: Discharge from hospital Pt/family agrees with progress & goals achieved: Yes Date patient discharged from therapy: 08/12/22   Cai Anfinson 08/12/2022, 12:17 PM

## 2022-08-12 NOTE — BHH Counselor (Signed)
CSW met with pt briefly to discuss discharge plans. She endorsed plans to go to her mother's home for a bit and stated that her ex-husband would be providing transportation for her. She declined scheduled aftercare appointment but did inquire about any news regarding substance use treatment. Pt and CSW discussed this briefly and she requested a list of the places of which we know. CSW agreed to give her a copy of resource list. She endorsed smoking cigarettes and substance use but declined cessation assistance or substance use treatment. Pt stated that she would call facilities on an outpatient basis for herself. No other concerns expressed. Contact ended without incident.   Chalmers Guest. Guerry Bruin, MSW, LCSW, [place] 08/12/2022 10:44 AM

## 2022-08-12 NOTE — Plan of Care (Signed)
D: Pt alert and oriented. Pt reports experiencing anxiety/depression at this time. Pt denies experiencing any pain at this time. Pt denies experiencing any SI/HI, or AVH at this time.   A: Scheduled medications administered to pt, per MD orders. Support and encouragement provided. Frequent verbal contact made. Routine safety checks conducted q15 minutes.   R: No adverse drug reactions noted. Pt verbally contracts for safety at this time. Pt compliant with medications. Pt interacts minimally with others on the unit, mostly self isolating to her room. Pt remains safe at this time. Plan of care ongoing.   Problem: Education: Goal: Knowledge of General Education information will improve Description: Including pain rating scale, medication(s)/side effects and non-pharmacologic comfort measures Outcome: Progressing   Problem: Nutrition: Goal: Adequate nutrition will be maintained Outcome: Progressing

## 2022-08-12 NOTE — Plan of Care (Signed)
  Problem: Group Participation Goal: STG - Patient will engage in groups without prompting or encouragement from LRT x3 group sessions within 5 recreation therapy group sessions Description: STG - Patient will engage in groups without prompting or encouragement from LRT x3 group sessions within 5 recreation therapy group sessions 08/12/2022 1215 by Ernest Haber, LRT Outcome: Not Applicable 07/16/5500 [id number] by Ernest Haber, LRT Outcome: Not Met (add Reason) Note: Patient spent most of her time in her room

## 2022-11-12 DIAGNOSIS — Z79899 Other long term (current) drug therapy: Secondary | ICD-10-CM | POA: Diagnosis not present

## 2022-11-12 DIAGNOSIS — R300 Dysuria: Secondary | ICD-10-CM | POA: Diagnosis not present

## 2022-11-12 DIAGNOSIS — R69 Illness, unspecified: Secondary | ICD-10-CM | POA: Diagnosis not present

## 2022-11-12 DIAGNOSIS — J449 Chronic obstructive pulmonary disease, unspecified: Secondary | ICD-10-CM | POA: Diagnosis not present

## 2022-11-12 DIAGNOSIS — R1010 Upper abdominal pain, unspecified: Secondary | ICD-10-CM | POA: Diagnosis not present

## 2022-11-13 ENCOUNTER — Emergency Department
Admission: EM | Admit: 2022-11-13 | Discharge: 2022-11-13 | Disposition: A | Discharge status: Home / Self Care | Payer: 59 | Attending: Emergency Medicine | Admitting: Emergency Medicine

## 2022-11-13 ENCOUNTER — Encounter: Payer: Self-pay | Admitting: Emergency Medicine

## 2022-11-13 ENCOUNTER — Other Ambulatory Visit: Payer: Self-pay

## 2022-11-13 DIAGNOSIS — F1510 Other stimulant abuse, uncomplicated: Secondary | ICD-10-CM | POA: Diagnosis not present

## 2022-11-13 DIAGNOSIS — R451 Restlessness and agitation: Secondary | ICD-10-CM | POA: Insufficient documentation

## 2022-11-13 DIAGNOSIS — S51812A Laceration without foreign body of left forearm, initial encounter: Secondary | ICD-10-CM | POA: Insufficient documentation

## 2022-11-13 DIAGNOSIS — S51811A Laceration without foreign body of right forearm, initial encounter: Secondary | ICD-10-CM | POA: Insufficient documentation

## 2022-11-13 DIAGNOSIS — F22 Delusional disorders: Secondary | ICD-10-CM | POA: Diagnosis not present

## 2022-11-13 DIAGNOSIS — Z046 Encounter for general psychiatric examination, requested by authority: Secondary | ICD-10-CM

## 2022-11-13 DIAGNOSIS — S59912A Unspecified injury of left forearm, initial encounter: Secondary | ICD-10-CM | POA: Diagnosis present

## 2022-11-13 DIAGNOSIS — Z7289 Other problems related to lifestyle: Secondary | ICD-10-CM

## 2022-11-13 DIAGNOSIS — X789XXA Intentional self-harm by unspecified sharp object, initial encounter: Secondary | ICD-10-CM | POA: Diagnosis not present

## 2022-11-13 DIAGNOSIS — F17210 Nicotine dependence, cigarettes, uncomplicated: Secondary | ICD-10-CM | POA: Insufficient documentation

## 2022-11-13 DIAGNOSIS — J45909 Unspecified asthma, uncomplicated: Secondary | ICD-10-CM | POA: Insufficient documentation

## 2022-11-13 DIAGNOSIS — R4588 Nonsuicidal self-harm: Secondary | ICD-10-CM | POA: Diagnosis not present

## 2022-11-13 LAB — ETHANOL: Alcohol, Ethyl (B): <10 mg/dL (ref <10)

## 2022-11-13 LAB — COMPREHENSIVE METABOLIC PANEL
ALT: 141 U/L — ABNORMAL HIGH (ref 0–44)
AST: 85 U/L — ABNORMAL HIGH (ref 15–41)
Albumin: 4.5 g/dL (ref 3.5–5.0)
Alkaline Phosphatase: 47 U/L (ref 38–126)
Anion gap: 7 (ref 5–15)
BUN: 17 mg/dL (ref 6–20)
CO2: 25 mmol/L (ref 22–32)
Calcium: 9 mg/dL (ref 8.9–10.3)
Chloride: 103 mmol/L (ref 98–111)
Creatinine, Ser: 0.83 mg/dL (ref 0.44–1.00)
GFR, Estimated: >60 mL/min (ref >60)
Glucose, Bld: 112 mg/dL — ABNORMAL HIGH (ref 70–99)
Potassium: 4.1 mmol/L (ref 3.5–5.1)
Sodium: 135 mmol/L (ref 135–145)
Total Bilirubin: 0.7 mg/dL (ref 0.3–1.2)
Total Protein: 8.3 g/dL — ABNORMAL HIGH (ref 6.5–8.1)

## 2022-11-13 LAB — URINE DRUG SCREEN, QUALITATIVE (ARMC ONLY)
Amphetamines, Ur Screen: POSITIVE — AB
Barbiturates, Ur Screen: NOT DETECTED
Benzodiazepine, Ur Scrn: NOT DETECTED
Cannabinoid 50 Ng, Ur ~~LOC~~: NOT DETECTED
Cocaine Metabolite,Ur ~~LOC~~: NOT DETECTED
MDMA (Ecstasy)Ur Screen: NOT DETECTED
Methadone Scn, Ur: NOT DETECTED
Opiate, Ur Screen: NOT DETECTED
Phencyclidine (PCP) Ur S: NOT DETECTED
Tricyclic, Ur Screen: NOT DETECTED

## 2022-11-13 LAB — CBC
HCT: 37.2 % (ref 36.0–46.0)
Hemoglobin: 12.5 g/dL (ref 12.0–15.0)
MCH: 30.6 pg (ref 26.0–34.0)
MCHC: 33.6 g/dL (ref 30.0–36.0)
MCV: 91 fL (ref 80.0–100.0)
Platelets: 369 10*3/uL (ref 150–400)
RBC: 4.09 MIL/uL (ref 3.87–5.11)
RDW: 13.2 % (ref 11.5–15.5)
WBC: 7.8 10*3/uL (ref 4.0–10.5)
nRBC: 0 % (ref 0.0–0.2)

## 2022-11-13 LAB — SALICYLATE LEVEL: Salicylate Lvl: <7 mg/dL — ABNORMAL LOW (ref 7.0–30.0)

## 2022-11-13 LAB — ACETAMINOPHEN LEVEL: Acetaminophen (Tylenol), Serum: <10 ug/mL — ABNORMAL LOW (ref 10–30)

## 2022-11-13 LAB — POC URINE PREG, ED: Preg Test, Ur: NEGATIVE

## 2022-11-13 NOTE — ED Notes (Signed)
Pt still refusing to give urine sample- filling cup up with cold water from sink
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
## 2022-11-13 NOTE — ED Notes (Signed)
Superficial lacerations to anterior forearms. No bleeding seen.reddened scratch seen to left neck.

## 2022-11-13 NOTE — Discharge Instructions (Signed)
Follow-up with your regular outpatient psychiatrist and avoid drug use.  Return to the ER for any new or worsening symptoms that concern you.

## 2022-11-13 NOTE — ED Notes (Signed)
IVC pending consult   

## 2022-11-13 NOTE — ED Notes (Signed)
Patient refusing to give a urine sample.

## 2022-11-13 NOTE — Consult Note (Signed)
[place] [facility] Consult   Reason for Consult: Self-harm behavior Referring Physician:  Mumma Patient Identification: Taylor Wiggins MRN:  [id number] Principal Diagnosis: Non-suicidal self-harm [facility]) Diagnosis:  Principal Problem:   Non-suicidal self-harm ([place])   Total Time spent with patient: 45 minutes  Subjective:   Taylor Wiggins is a 41 y.o. female patient admitted with nonsuicidal self-injurious behavior.Marland Kitchen  HPI: Patient presented to the ED after her brother called police because patient was "in hysterics" during an argument.  On evaluation, patient is mildly anxious.  She states that she and her husband were arguing because she found out that he was "cheating on her."  This is in contrast to what the mother says.  Patient has superficial cuts on her bilateral forearms.  She again reiterates that she is here was upset because of her husband's cheating on her.  She denies any suicidal thought or intent.  She said she was just upset and just did this as a "release."  She agrees that she was arguing with both her mother and her husband.  Patient states that she is thinking of leaving her husband.  She is blaming him for her drug use.  Patient states that she has a court date in [facility] tomorrow that she must make.  (Mother confirms this).  Patient admits to methamphetamine use, in addition to her prescribed medications.  BAL <10. UDS PDMD review; active prescriptions for clonazepam and dextroamphetamine.  Patient is alert and oriented x 4.  She denies homicidal ideation, suicidal ideation ideation, auditory or visual hallucinations or paranoia.  She is speaking in a year sentences. Patient has not had any unsafe behavior in the ED.  Discussion with patient regarding substance use and her prescriptions that she is getting for "ADHD."  Advised patient to talk to her outpatient provider and just that they may try try Strattera instead.  Patient agrees.   Collateral from  mother,  Jeb Levering, [phone]: Mother states that patient began yelling, throwing things around the house, taking a very dull knife and cutting her forearms, because mother asked for her to return the credit card that patient took from mother's husband.  Mother states that patient was calling her all kinds of names and threatening to harm herself, so the police were called.  Writer spoke with mother at length, offering encouragement and support.  Mother states that she is a retired Education officer, museum and has tried for many years to get [place] help.  She states that patient has been in rehab many times.  Mother's believes that patient is also using illicit substances, in addition to prescribed benzodiazepines and Adderall.  Mother thought by by sending her here she might get help with her substance abuse.  Discussed the ED's role in this.  Mother voices understanding and is agreeable for patient to return to her home at this time.  She is going to talk to her about her boundaries and limitations with her behavior.  Patient and her husband live with patient's mother and mother's husband, who has advanced dementia.  Writer also spoke with husband, who agrees that he has no concerns that patient will have kill herself.  He is agreed to keep sharp objects objects out of reach.  No known access to firearms/other weapons.  Husband would like to come and pick her up.  He agrees that she does have a court date in [facility] tomorrow.  Patient also has outpatient provider at [facility]  in [place]  for mental health and substance use treatment.  Husband states that they go there together.  He is going to encourage her to talk to her provider about alternate medications for anxiety and ADHD   Past Psychiatric History: Anxiety, opiate addiction, ADHD, and substance induced psychotic disorder with delusions, polysubstance abuse  Risk to Self:   Risk to Others:   Prior Inpatient Therapy:   Prior Outpatient  Therapy:    Past Medical History:  Past Medical History:  Diagnosis Date   Adult ADHD    Anxiety    Asthma    IBS (irritable bowel syndrome)    Opioid use disorder    No longer on buprenorphine therapy   PTSD (post-traumatic stress disorder)     Past Surgical History:  Procedure Laterality Date   BREAST REDUCTION SURGERY  2005   Family History: No family history on file. Family Psychiatric  History:  Social History:  Social History   Substance and Sexual Activity  Alcohol Use No     Social History   Substance and Sexual Activity  Drug Use Yes   Types: Methamphetamines   Comment: recently    Social History   Socioeconomic History   Marital status: Single    Spouse name: Not on file   Number of children: Not on file   Years of education: Not on file   Highest education level: Not on file  Occupational History   Not on file  Tobacco Use   Smoking status: Every Day    Packs/day: 1.00    Types: Cigarettes    Last attempt to quit: 09/06/2014    Years since quitting: 8.1   Smokeless tobacco: Never  Substance and Sexual Activity   Alcohol use: No   Drug use: Yes    Types: Methamphetamines    Comment: recently   Sexual activity: Never    Birth control/protection: Abstinence  Other Topics Concern   Not on file  Social History Narrative   41 y/o Caucasian female. Married and homeless with polysubstance abuse. Per pt-Graduated from [employer] in 2006 with bachelors degree in [place].   Social Determinants of Health   Financial Resource Strain: Not on file  Food Insecurity: Not on file  Transportation Needs: Not on file  Physical Activity: Not on file  Stress: Not on file  Social Connections: Not on file   Additional Social History:    Allergies:   Allergies  Allergen Reactions   Tramadol Other (See Comments)    Seizures    Mold Extract [Trichophyton] Other (See Comments)    Sneezing    Labs:  Results for orders placed or  performed during the hospital encounter of 11/13/22 (from the past 48 hour(s))  Comprehensive metabolic panel     Status: Abnormal   Collection Time: 11/13/22 12:23 PM  Result Value Ref Range   Sodium 135 135 - 145 mmol/L   Potassium 4.1 3.5 - 5.1 mmol/L   Chloride 103 98 - 111 mmol/L   CO2 25 22 - 32 mmol/L   Glucose, Bld 112 (H) 70 - 99 mg/dL    Comment: Glucose reference range applies only to samples taken after fasting for at least 8 hours.   BUN 17 6 - 20 mg/dL   Creatinine, Ser 0.83 0.44 - 1.00 mg/dL   Calcium 9.0 8.9 - 10.3 mg/dL   Total Protein 8.3 (H) 6.5 - 8.1 g/dL   Albumin 4.5 3.5 - 5.0 g/dL   AST 85 (H) 15 - 41  U/L   ALT 141 (H) 0 - 44 U/L   Alkaline Phosphatase 47 38 - 126 U/L   Total Bilirubin 0.7 0.3 - 1.2 mg/dL   GFR, Estimated >60 >60 mL/min    Comment: (NOTE) Calculated using the CKD-EPI Creatinine Equation (2021)    Anion gap 7 5 - 15    Comment: Performed at [facility], [address] [place], [place] [address]  Ethanol     Status: None   Collection Time: 11/13/22 12:23 PM  Result Value Ref Range   Alcohol, Ethyl (B) <10 <10 mg/dL    Comment: (NOTE) Lowest detectable limit for serum alcohol is 10 mg/dL.  For medical purposes only. Performed at [facility], [address] [place], [place] [address]   Salicylate level     Status: Abnormal   Collection Time: 11/13/22 12:23 PM  Result Value Ref Range   Salicylate Lvl <0.3 (L) 7.0 - 30.0 mg/dL    Comment: Performed at [facility], [address] [place], [place] [address]  Acetaminophen level     Status: Abnormal   Collection Time: 11/13/22 12:23 PM  Result Value Ref Range   Acetaminophen (Tylenol), Serum <10 (L) 10 - 30 ug/mL    Comment: (NOTE) Therapeutic concentrations vary significantly. A range of 10-30 ug/mL  may be an effective concentration for many patients. However, some  are best treated at concentrations outside of this range. Acetaminophen  concentrations >150 ug/mL at 4 hours after ingestion  and >50 ug/mL at 12 hours after ingestion are often associated with  toxic reactions.  Performed at [facility], [address] [place], [place] [address]   cbc     Status: None   Collection Time: 11/13/22 12:23 PM  Result Value Ref Range   WBC 7.8 4.0 - 10.5 K/uL   RBC 4.09 3.87 - 5.11 MIL/uL   Hemoglobin 12.5 12.0 - 15.0 g/dL   HCT 37.2 36.0 - 46.0 %   MCV 91.0 80.0 - 100.0 fL   MCH 30.6 26.0 - 34.0 pg   MCHC 33.6 30.0 - 36.0 g/dL   RDW 13.2 11.5 - 15.5 %   Platelets 369 150 - 400 K/uL   nRBC 0.0 0.0 - 0.2 %    Comment: Performed at [facility], [address], [place], [place] [address]  Urine Drug Screen, Qualitative     Status: Abnormal   Collection Time: 11/13/22 12:23 PM  Result Value Ref Range   Tricyclic, Ur Screen NONE DETECTED NONE DETECTED   Amphetamines, Ur Screen POSITIVE (A) NONE DETECTED   MDMA (Ecstasy)Ur Screen NONE DETECTED NONE DETECTED   Cocaine Metabolite,Ur [place] NONE DETECTED NONE DETECTED   Opiate, Ur Screen NONE DETECTED NONE DETECTED   Phencyclidine (PCP) Ur S NONE DETECTED NONE DETECTED   Cannabinoid 50 Ng, Ur [place] NONE DETECTED NONE DETECTED   Barbiturates, Ur Screen NONE DETECTED NONE DETECTED   Benzodiazepine, Ur Scrn NONE DETECTED NONE DETECTED   Methadone Scn, Ur NONE DETECTED NONE DETECTED    Comment: (NOTE) Tricyclics + metabolites, urine    Cutoff 1000 ng/mL Amphetamines + metabolites, urine  Cutoff 1000 ng/mL MDMA (Ecstasy), urine              Cutoff 500 ng/mL Cocaine Metabolite, urine          Cutoff 300 ng/mL Opiate + metabolites, urine        Cutoff 300 ng/mL Phencyclidine (PCP), urine  Cutoff 25 ng/mL Cannabinoid, urine                 Cutoff 50 ng/mL Barbiturates + metabolites, urine  Cutoff 200 ng/mL Benzodiazepine, urine              Cutoff 200 ng/mL Methadone, urine                   Cutoff 300 ng/mL  The urine drug screen provides only a  preliminary, unconfirmed analytical test result and should not be used for non-medical purposes. Clinical consideration and professional judgment should be applied to any positive drug screen result due to possible interfering substances. A more specific alternate chemical method must be used in order to obtain a confirmed analytical result. Gas chromatography / mass spectrometry (GC/MS) is the preferred confirm atory method. Performed at [facility], [address] [place], [place] [address]   POC urine preg, ED     Status: None   Collection Time: 11/13/22  3:10 PM  Result Value Ref Range   Preg Test, Ur Negative Negative    No current facility-administered medications for this encounter.   Current Outpatient Medications  Medication Sig Dispense Refill   amphetamine-dextroamphetamine (ADDERALL) 30 MG tablet Take 30 mg by mouth 2 (two) times daily.     clonazePAM (KLONOPIN) 1 MG tablet Take 1 mg by mouth 2 (two) times daily as needed for anxiety.     gabapentin (NEURONTIN) 300 MG capsule Take 1 capsule (300 mg total) by mouth every 6 (six) hours as needed (Anxiety if not relieved by hydroxyzine or if this medicine is preferred). (Patient not taking: Reported on 11/13/2022) 60 capsule 1   QUEtiapine (SEROQUEL) 100 MG tablet Take 1 tablet (100 mg total) by mouth at bedtime as needed (Sleep). (Patient not taking: Reported on 11/13/2022) 30 tablet 1    Musculoskeletal: Strength & Muscle Tone: within normal limits Gait & Station: normal Patient leans: N/A  Psychiatric Specialty Exam:  Presentation  General Appearance:  Casual  Eye Contact: Good  Speech: Clear and Coherent  Speech Volume: Normal  Handedness: Right   Mood and Affect  Mood: Anxious  Affect: Congruent   Thought Process  Thought Processes: Coherent  Descriptions of Associations:Intact  Orientation:Full (Time, Place and Person)  Thought Content:WDL  History of  Schizophrenia/Schizoaffective disorder:No  Duration of Psychotic Symptoms:N/A  Hallucinations:Hallucinations: None  Ideas of Reference:None  Suicidal Thoughts:Suicidal Thoughts: No  Homicidal Thoughts:Homicidal Thoughts: No   Sensorium  Memory: Immediate Fair  Judgment: Fair  Insight: Poor   Executive Functions  Concentration: Fair  Attention Span: Fair  Recall: Francis of Knowledge: Fair  Language: Fair   Psychomotor Activity  Psychomotor Activity:Psychomotor Activity: Increased   Assets  Assets: Financial Resources/Insurance; Desire for Improvement; Physical Health; Resilience   Sleep  Sleep:Sleep: Fair   Physical Exam: Physical Exam Vitals and nursing note reviewed.  HENT:     Head: Normocephalic.     Nose: No congestion or rhinorrhea.  Eyes:     General:        Right eye: No discharge.        Left eye: No discharge.  Cardiovascular:     Rate and Rhythm: Normal rate.  Pulmonary:     Effort: Pulmonary effort is normal.  Musculoskeletal:     Cervical back: Normal range of motion.  Skin:    General: Skin is dry.  Neurological:     Mental Status: She is alert and oriented to  person, place, and time.  Psychiatric:        Attention and Perception: Attention normal.        Mood and Affect: Mood normal.        Speech: Speech normal.        Behavior: Behavior normal.        Thought Content: Thought content normal. Thought content is not paranoid or delusional. Thought content does not include homicidal or suicidal ideation.        Cognition and Memory: Cognition normal.        Judgment: Judgment is impulsive.    Review of Systems  Constitutional: Negative.   HENT: Negative.    Eyes: Negative.   Respiratory: Negative.    Musculoskeletal: Negative.   Skin:        Superficial scratches on bilateral forearms  Psychiatric/Behavioral:  Positive for depression (hx of) and substance abuse. Negative for hallucinations, memory loss and  suicidal ideas. The patient is nervous/anxious. The patient does not have insomnia.    Blood pressure 129/85, pulse 97, temperature 97.8 F (36.6 C), temperature source Oral, resp. rate 16, height '5\' 7"'$  (1.702 m), weight 56.2 kg, last menstrual period 09/30/2022, SpO2 97 %. Body mass index is 19.41 kg/m.  Treatment Plan Summary: Plan patient to follow-up with her outpatient providers, of which she has established relationships.  Patient does not present as being in imminent danger of suicide or homicide.  Patient's family is agreeing to take her home with safety measures in place.  Reviewed with EDP  Disposition: No evidence of imminent risk to self or others at present.   Supportive therapy provided about ongoing stressors. Discussed crisis plan, support from social network, calling 911, coming to the Emergency Department, and calling Suicide Hotline.  Sherlon Handing, NP 11/13/2022 4:27 PM

## 2022-11-13 NOTE — ED Notes (Signed)
E-signature not working at this time. Pt verbalized understanding of D/C instructions, prescriptions and follow up care with no further questions at this time. Pt in NAD and ambulatory at time of D/C.  Belongings bag 1/1 returned to patient at time of discharge. Pt husband coming to get patient per Barbaraann Share, NP

## 2022-11-13 NOTE — ED Notes (Signed)
Patient belongings:  Archivist. teeshirt

## 2022-11-13 NOTE — ED Notes (Signed)
Pt gave urine cup filled with sink water for sample. Pt informed this was not adequate for testing and needed urine.

## 2022-11-13 NOTE — ED Notes (Signed)
Pt tried to walk off while this tech and rn annie and other nurses were helping a pt in the hall. Security asked her where she was going and pt stated "to go get the phone" security escorted pt back to room

## 2022-11-13 NOTE — ED Notes (Signed)
Patient given urine cup

## 2022-11-13 NOTE — ED Provider Notes (Signed)
-----------------------------------------   4:38 PM on 11/13/2022 -----------------------------------------  I consulted with NP Barthold from psychiatry who evaluated the patient and obtain for collateral from family members.  She has rescinded the IVC.  The patient does not demonstrate acute danger to self or others at this time and is stable for discharge.  I provided discharge instructions and return precautions.   Arta Silence, MD 11/13/22 [phone]

## 2022-11-24 ENCOUNTER — Emergency Department
Admission: EM | Admit: 2022-11-24 | Discharge: 2022-11-24 | Disposition: A | Discharge status: Home / Self Care | Payer: 59 | Source: Home / Self Care | Attending: Emergency Medicine | Admitting: Emergency Medicine

## 2022-11-24 ENCOUNTER — Emergency Department
Admission: EM | Admit: 2022-11-24 | Discharge: 2022-11-24 | Disposition: A | Discharge status: Home / Self Care | Payer: 59 | Attending: Student in an Organized Health Care Education/Training Program | Admitting: Student in an Organized Health Care Education/Training Program

## 2022-11-24 ENCOUNTER — Other Ambulatory Visit: Payer: Self-pay

## 2022-11-24 DIAGNOSIS — R110 Nausea: Secondary | ICD-10-CM | POA: Insufficient documentation

## 2022-11-24 DIAGNOSIS — R112 Nausea with vomiting, unspecified: Secondary | ICD-10-CM | POA: Insufficient documentation

## 2022-11-24 DIAGNOSIS — R519 Headache, unspecified: Secondary | ICD-10-CM | POA: Insufficient documentation

## 2022-11-24 DIAGNOSIS — Z20822 Contact with and (suspected) exposure to covid-19: Secondary | ICD-10-CM | POA: Insufficient documentation

## 2022-11-24 LAB — CBC WITH DIFFERENTIAL/PLATELET
Abs Immature Granulocytes: 0.02 10*3/uL (ref 0.00–0.07)
Basophils Absolute: 0.1 10*3/uL (ref 0.0–0.1)
Basophils Relative: 1 %
Eosinophils Absolute: 0.4 10*3/uL (ref 0.0–0.5)
Eosinophils Relative: 5 %
HCT: 43.7 % (ref 36.0–46.0)
Hemoglobin: 14.5 g/dL (ref 12.0–15.0)
Immature Granulocytes: 0 %
Lymphocytes Relative: 21 %
Lymphs Abs: 1.9 10*3/uL (ref 0.7–4.0)
MCH: 30.9 pg (ref 26.0–34.0)
MCHC: 33.2 g/dL (ref 30.0–36.0)
MCV: 93.2 fL (ref 80.0–100.0)
Monocytes Absolute: 0.9 10*3/uL (ref 0.1–1.0)
Monocytes Relative: 9 %
Neutro Abs: 5.8 10*3/uL (ref 1.7–7.7)
Neutrophils Relative %: 64 %
Platelets: 415 10*3/uL — ABNORMAL HIGH (ref 150–400)
RBC: 4.69 MIL/uL (ref 3.87–5.11)
RDW: 13.7 % (ref 11.5–15.5)
WBC: 9 10*3/uL (ref 4.0–10.5)
nRBC: 0 % (ref 0.0–0.2)

## 2022-11-24 LAB — SALICYLATE LEVEL: Salicylate Lvl: <7 mg/dL — ABNORMAL LOW (ref 7.0–30.0)

## 2022-11-24 LAB — URINALYSIS, ROUTINE W REFLEX MICROSCOPIC
Bilirubin Urine: NEGATIVE
Glucose, UA: NEGATIVE mg/dL
Hgb urine dipstick: NEGATIVE
Ketones, ur: NEGATIVE mg/dL
Leukocytes,Ua: NEGATIVE
Nitrite: POSITIVE — AB
Protein, ur: NEGATIVE mg/dL
Specific Gravity, Urine: 1.017 (ref 1.005–1.030)
pH: 6 (ref 5.0–8.0)

## 2022-11-24 LAB — RESP PANEL BY RT-PCR (FLU A&B, COVID) ARPGX2
Influenza A by PCR: NEGATIVE
Influenza B by PCR: NEGATIVE
SARS Coronavirus 2 by RT PCR: NEGATIVE

## 2022-11-24 LAB — ACETAMINOPHEN LEVEL: Acetaminophen (Tylenol), Serum: <10 ug/mL — ABNORMAL LOW (ref 10–30)

## 2022-11-24 LAB — URINE DRUG SCREEN, QUALITATIVE (ARMC ONLY)
Amphetamines, Ur Screen: NOT DETECTED
Barbiturates, Ur Screen: NOT DETECTED
Benzodiazepine, Ur Scrn: NOT DETECTED
Cannabinoid 50 Ng, Ur ~~LOC~~: NOT DETECTED
Cocaine Metabolite,Ur ~~LOC~~: NOT DETECTED
MDMA (Ecstasy)Ur Screen: NOT DETECTED
Methadone Scn, Ur: NOT DETECTED
Opiate, Ur Screen: NOT DETECTED
Phencyclidine (PCP) Ur S: NOT DETECTED
Tricyclic, Ur Screen: POSITIVE — AB

## 2022-11-24 LAB — COMPREHENSIVE METABOLIC PANEL
ALT: 193 U/L — ABNORMAL HIGH (ref 0–44)
AST: 136 U/L — ABNORMAL HIGH (ref 15–41)
Albumin: 4.1 g/dL (ref 3.5–5.0)
Alkaline Phosphatase: 76 U/L (ref 38–126)
Anion gap: 7 (ref 5–15)
BUN: 10 mg/dL (ref 6–20)
CO2: 29 mmol/L (ref 22–32)
Calcium: 9.5 mg/dL (ref 8.9–10.3)
Chloride: 102 mmol/L (ref 98–111)
Creatinine, Ser: 0.61 mg/dL (ref 0.44–1.00)
GFR, Estimated: >60 mL/min (ref >60)
Glucose, Bld: 124 mg/dL — ABNORMAL HIGH (ref 70–99)
Potassium: 4.6 mmol/L (ref 3.5–5.1)
Sodium: 138 mmol/L (ref 135–145)
Total Bilirubin: 0.7 mg/dL (ref 0.3–1.2)
Total Protein: 8.3 g/dL — ABNORMAL HIGH (ref 6.5–8.1)

## 2022-11-24 LAB — POC URINE PREG, ED: Preg Test, Ur: NEGATIVE

## 2022-11-24 LAB — ETHANOL: Alcohol, Ethyl (B): <10 mg/dL (ref <10)

## 2022-11-24 MED ORDER — NITROFURANTOIN MONOHYD MACRO 100 MG PO CAPS: 100.0000 mg | ORAL_CAPSULE | Freq: Two times a day (BID) | ORAL | 0 refills | Status: DC

## 2022-11-24 MED ORDER — ACETAMINOPHEN 325 MG PO TABS
650.0000 mg | ORAL_TABLET | Freq: Once | ORAL | Status: AC
  Administered 2022-11-24: 650 mg via ORAL
  Filled 2022-11-24: qty 2

## 2022-11-24 MED ORDER — NITROFURANTOIN MONOHYD MACRO 100 MG PO CAPS: 100.0000 mg | ORAL_CAPSULE | Freq: Two times a day (BID) | ORAL | 0 refills | Status: AC

## 2022-11-24 MED ORDER — ONDANSETRON HCL 4 MG/2ML IJ SOLN
4.0000 mg | Freq: Once | INTRAMUSCULAR | Status: AC
  Administered 2022-11-24: 4 mg via INTRAVENOUS
  Filled 2022-11-24: qty 2

## 2022-11-24 MED ORDER — ONDANSETRON 4 MG PO TBDP
4.0000 mg | ORAL_TABLET | Freq: Once | ORAL | Status: AC
  Administered 2022-11-24: 4 mg via ORAL
  Filled 2022-11-24: qty 1

## 2022-11-24 MED ORDER — SODIUM CHLORIDE 0.9 % IV BOLUS
1000.0000 mL | Freq: Once | INTRAVENOUS | Status: AC
  Administered 2022-11-24: 1000 mL via INTRAVENOUS

## 2022-11-24 MED ORDER — ONDANSETRON HCL 4 MG PO TABS: 4.0000 mg | ORAL_TABLET | Freq: Three times a day (TID) | ORAL | 0 refills | Status: DC | PRN

## 2022-11-24 NOTE — Discharge Instructions (Addendum)
Please seek medical attention for any high fevers, chest pain, shortness of breath, change in behavior, persistent vomiting, bloody stool or any other new or concerning symptoms.  

## 2022-11-24 NOTE — ED Notes (Signed)
Pt dredded out in burgundy scrubs by myself and [name] present.  Pts belongings, include black zip up, pink shirt, pants, shoes, socks, hairtie, bagged, labelled and placed at nurses station.  Pt aware of need for urine sample.  Labs drawn and swab done.  Pt denies cell phone, wallet or cash.

## 2022-11-24 NOTE — ED Triage Notes (Signed)
Pt reports headache behind forehead and in temples beginning last night after heat was turned on in house. Pt also reports people came to treat house with bug spray yesterday as well. Pt states she is nauseous and having hot flashes, states temp of 100F at home. Pt states she is also having tremors in her hands, and confusion. No tremors/shaking noted in triage.

## 2022-11-24 NOTE — ED Provider Notes (Signed)
[facility] Provider Note    Event Date/Time   First MD Initiated Contact with Patient 11/24/22 0700     (approximate)   History   Headache   HPI  Taylor Wiggins is a 41 y.o. female with a history of psychiatric illness as well as substance abuse presents to the ER for evaluation of headache and nausea that occurred after she says that past control services were being performed out of her house treating for "mice and roaches.  ".  States that she started feeling the symptoms after being exposed to chemicals.  States that she has felt shaky but feels improved now that she is out of the house.  She denies any SI or HI.  Denies any other concerns or symptoms.  She is requesting to speak to psychiatry*     Physical Exam   Triage Vital Signs: ED Triage Vitals  Enc Vitals Group     BP 11/24/22 0630 118/89     Pulse Rate 11/24/22 0627 95     Resp 11/24/22 0627 15     Temp 11/24/22 0627 98.1 F (36.7 C)     Temp Source 11/24/22 0627 Oral     SpO2 11/24/22 0627 99 %     Weight 11/24/22 0630 145 lb (65.8 kg)     Height 11/24/22 0630 '5\' 7"'$  (1.702 m)     Head Circumference --      Peak Flow --      Pain Score 11/24/22 0629 8     Pain Loc --      Pain Edu? --      Excl. in [place]? --     Most recent vital signs: Vitals:   11/24/22 0627 11/24/22 0630  BP:  118/89  Pulse: 95   Resp: 15   Temp: 98.1 F (36.7 C)   SpO2: 99%      Constitutional: Alert  Eyes: Conjunctivae are normal.  Head: Atraumatic. Nose: No congestion/rhinnorhea. Mouth/Throat: Mucous membranes are moist.   Neck: Painless ROM.  Cardiovascular:   Good peripheral circulation. Respiratory: Normal respiratory effort.  No retractions.  Gastrointestinal: Soft and nontender.  Musculoskeletal:  no deformity Neurologic:  MAE spontaneously. No gross focal neurologic deficits are appreciated.  Skin:  Skin is warm, dry and intact. No rash noted. Psychiatric: Mood and affect are normal.  Speech and behavior are normal.    ED Results / Procedures / Treatments   Labs (all labs ordered are listed, but only abnormal results are displayed) Labs Reviewed  COMPREHENSIVE METABOLIC PANEL - Abnormal; Notable for the following components:      Result Value   Glucose, Bld 124 (*)    Total Protein 8.3 (*)    AST 136 (*)    ALT 193 (*)    All other components within normal limits  URINE DRUG SCREEN, QUALITATIVE ([facility] ONLY) - Abnormal; Notable for the following components:   Tricyclic, Ur Screen POSITIVE (*)    All other components within normal limits  CBC WITH DIFFERENTIAL/PLATELET - Abnormal; Notable for the following components:   Platelets 415 (*)    All other components within normal limits  SALICYLATE LEVEL - Abnormal; Notable for the following components:   Salicylate Lvl <3.6 (*)    All other components within normal limits  ACETAMINOPHEN LEVEL - Abnormal; Notable for the following components:   Acetaminophen (Tylenol), Serum <10 (*)    All other components within normal limits  URINALYSIS, ROUTINE W REFLEX MICROSCOPIC - Abnormal;  Notable for the following components:   Color, Urine YELLOW (*)    APPearance CLOUDY (*)    Nitrite POSITIVE (*)    Bacteria, UA MANY (*)    All other components within normal limits  RESP PANEL BY RT-PCR (FLU A&B, COVID) ARPGX2  ETHANOL  POC URINE PREG, ED     EKG     RADIOLOGY    PROCEDURES:  Critical Care performed:   Procedures   MEDICATIONS ORDERED IN ED: Medications  acetaminophen (TYLENOL) tablet 650 mg (650 mg Oral Given 11/24/22 0721)  ondansetron (ZOFRAN-ODT) disintegrating tablet 4 mg (4 mg Oral Given 11/24/22 0721)     IMPRESSION / MDM / ASSESSMENT AND PLAN / ED COURSE  I reviewed the triage vital signs and the nursing notes.                              Differential diagnosis includes, but is not limited to, chemical exposure, tension, cluster, migraine, electrolyte abnormality  Patient presented  to the ER for evaluation of symptoms as described above.  She is clinically well-appearing in no acute distress.  She denies any SI or HI.  Blood work sent for the above differential.   Clinical Course as of 11/24/22 1024  Sun Nov 24, 2022  0825 Patient's blood work is reassuring.  Will cover with antibiotics for UTI with Macrobid.  She is not septic.  Symptoms improved.  Further clarification she is wanting to talk to psychiatry counselor regarding resources for shelter.  Does not want to wait to speak with them she denies any SI or HI or mental health issue at this time.  Will be given resources for shelters.  Patient agreeable to plan. [PR]    Clinical Course User Index [PR] Merlyn Lot, MD      FINAL CLINICAL IMPRESSION(S) / ED DIAGNOSES   Final diagnoses:  Nausea  Nonintractable headache, unspecified chronicity pattern, unspecified headache type     Rx / DC Orders   ED Discharge Orders          Ordered    nitrofurantoin, macrocrystal-monohydrate, (MACROBID) 100 MG capsule  2 times daily,   Status:  Discontinued        11/24/22 0824    nitrofurantoin, macrocrystal-monohydrate, (MACROBID) 100 MG capsule  2 times daily        11/24/22 0825             Note:  This document was prepared using Dragon voice recognition software and may include unintentional dictation errors.    Merlyn Lot, MD 11/24/22 1024

## 2022-11-24 NOTE — ED Notes (Signed)
Pt's mother called and informed pt left behind her jacket and a towel. Pt's mother states she could come tomorrow to pick it up.

## 2022-11-24 NOTE — ED Triage Notes (Signed)
Pt was seen earlier this morning for HA- pt states now she has n/v and cannot keep anything down- discussed with [place], [place] and he states no labs at this time

## 2022-11-24 NOTE — ED Provider Notes (Signed)
   [facility] Provider Note    Event Date/Time   First MD Initiated Contact with Patient 11/24/22 1728     (approximate)   History   Emesis   HPI  Taylor Wiggins is a 41 y.o. female  who presents to the emergency department today because of concern for nausea and vomiting. Patient states she feels dehydrated. Was seen in the emergency department earlier today for headache and nausea. She denies any abdominal pain. Denies any fevers.       Physical Exam   Triage Vital Signs: ED Triage Vitals  Enc Vitals Group     BP 11/24/22 1437 108/85     Pulse Rate 11/24/22 1437 79     Resp 11/24/22 1437 18     Temp 11/24/22 1437 97.7 F (36.5 C)     Temp Source 11/24/22 1437 Oral     SpO2 11/24/22 1437 99 %     Weight --      Height --      Head Circumference --      Peak Flow --      Pain Score 11/24/22 1435 0     Pain Loc --      Pain Edu? --      Excl. in [place]? --     Most recent vital signs: Vitals:   11/24/22 1437  BP: 108/85  Pulse: 79  Resp: 18  Temp: 97.7 F (36.5 C)  SpO2: 99%   General: Awake, alert, oriented. CV:  Good peripheral perfusion. Regular rate and rhythm. Resp:  Normal effort. Lungs clear. Abd:  No distention. Abdomen non tender.    ED Results / Procedures / Treatments   Labs (all labs ordered are listed, but only abnormal results are displayed) Labs Reviewed - No data to display   EKG  None   RADIOLOGY None   PROCEDURES:  Critical Care performed: No  Procedures   MEDICATIONS ORDERED IN ED: Medications  sodium chloride 0.9 % bolus 1,000 mL (has no administration in time range)  ondansetron (ZOFRAN) injection 4 mg (has no administration in time range)     IMPRESSION / MDM / ASSESSMENT AND PLAN / ED COURSE  I reviewed the triage vital signs and the nursing notes.                              Differential diagnosis includes, but is not limited to, UTI, gastroenteritis.  Patient's presentation  is most consistent with acute presentation with potential threat to life or bodily function.  Patient presented to the emergency department today because of concern for nausea and vomiting. Had been seen earlier in the day and had blood work done at that time. Abdomen is non tender. Patient afebrile. Do not feel repeat blood work is necessary. Patient was given IVFs and antiemetic. Did feel better afterwards. Will plan on discharging with prescription for antiemetic.   FINAL CLINICAL IMPRESSION(S) / ED DIAGNOSES   Final diagnoses:  Nausea and vomiting, unspecified vomiting type    Note:  This document was prepared using Dragon voice recognition software and may include unintentional dictation errors.    Nance Pear, MD 11/24/22 [phone]

## 2022-11-24 NOTE — ED Provider Triage Note (Signed)
  Emergency Medicine Provider Triage Evaluation Note  Taylor Wiggins , a 41 y.o.female,  was evaluated in triage.  Pt complains of nausea/vomiting.  She states that she was seen here earlier today and was provided with antibiotics to treat for suspected urinary tract infection.  However, she states that she is unable to hold down the antibiotics.  She states that she is here because she feels like she needs to have IV antibiotics.   Review of Systems  Positive: Nausea/vomiting Negative: Denies fever, chest pain, vomiting  Physical Exam  There were no vitals filed for this visit. Gen:   Awake, no distress   Resp:  Normal effort  MSK:   Moves extremities without difficulty  Other:    Medical Decision Making  Given the patient's initial medical screening exam, the following diagnostic evaluation has been ordered. The patient will be placed in the appropriate treatment space, once one is available, to complete the evaluation and treatment. I have discussed the plan of care with the patient and I have advised the patient that an ED physician or mid-level practitioner will reevaluate their condition after the test results have been received, as the results may give them additional insight into the type of treatment they may need.    Diagnostics: None immediately  Treatments: none immediately   Teodoro Spray, [place] 11/24/22 1433

## 2022-11-24 NOTE — ED Triage Notes (Signed)
Pt presents via EMS c/o N/V. Seen in ED today, reports no improvement of symptoms.

## 2022-12-05 ENCOUNTER — Encounter: Payer: Self-pay | Admitting: Physician Assistant

## 2022-12-05 ENCOUNTER — Telehealth: Payer: Self-pay | Admitting: Physician Assistant

## 2022-12-05 ENCOUNTER — Ambulatory Visit (INDEPENDENT_AMBULATORY_CARE_PROVIDER_SITE_OTHER): Payer: 59 | Admitting: Physician Assistant

## 2022-12-05 VITALS — BP 100/70 | HR 83 | Temp 98.3°F | Resp 16 | Ht 67.5 in | Wt 148.4 lb

## 2022-12-05 DIAGNOSIS — F419 Anxiety disorder, unspecified: Secondary | ICD-10-CM

## 2022-12-05 DIAGNOSIS — F4310 Post-traumatic stress disorder, unspecified: Secondary | ICD-10-CM | POA: Diagnosis not present

## 2022-12-05 DIAGNOSIS — F909 Attention-deficit hyperactivity disorder, unspecified type: Secondary | ICD-10-CM

## 2022-12-05 DIAGNOSIS — Z124 Encounter for screening for malignant neoplasm of cervix: Secondary | ICD-10-CM

## 2022-12-05 DIAGNOSIS — Z79899 Other long term (current) drug therapy: Secondary | ICD-10-CM | POA: Diagnosis not present

## 2022-12-05 DIAGNOSIS — R69 Illness, unspecified: Secondary | ICD-10-CM | POA: Diagnosis not present

## 2022-12-05 LAB — POCT URINE DRUG SCREEN
Methylenedioxyamphetamine: NOT DETECTED
POC Amphetamine UR: POSITIVE — AB
POC BENZODIAZEPINES UR: NOT DETECTED
POC Barbiturate UR: NOT DETECTED
POC Cocaine UR: NOT DETECTED
POC Ecstasy UR: NOT DETECTED
POC Marijuana UR: POSITIVE — AB
POC Methadone UR: NOT DETECTED
POC Methamphetamine UR: NOT DETECTED
POC Opiate Ur: NOT DETECTED
POC Oxycodone UR: NOT DETECTED
POC PHENCYCLIDINE UR: NOT DETECTED
POC TRICYCLICS UR: POSITIVE — AB

## 2022-12-05 MED ORDER — AMPHETAMINE-DEXTROAMPHETAMINE 30 MG PO TABS: 30.0000 mg | ORAL_TABLET | Freq: Every day | ORAL | 0 refills | Status: DC

## 2022-12-05 MED ORDER — CLONAZEPAM 1 MG PO TABS: 1.0000 mg | ORAL_TABLET | Freq: Every day | ORAL | 0 refills | Status: DC | PRN

## 2022-12-05 NOTE — Progress Notes (Signed)
[facility], [place] [address]  Internal MEDICINE  Office Visit Note  Patient Name: Taylor Wiggins  [id number]  [id number]  Date of Service: 12/11/2022   Complaints/HPI Pt is here for establishment of PCP. Chief Complaint  Patient presents with   New Patient (Initial Visit)   Quality Metric Gaps    Papsmear and TDAP   HPI Pt is here for routine follow up -Does have a history of anxiety, adhd, and ptsd. Has not been to psychiatry in several years.  -Would like referral to OBGYN -Not currently working, looking for something else now -Living with ex-husband, no kids or pets. Does help care for stepdad -Smoking 1/2ppd, does not drink alcohol, has smoked marijuana not long ago, but not recently -Taking adderall and klonopin and has now run out. Would like refills. Discussed we could do 30tabs but then will need to see psychiatry for further fills. Patient understands. -No thoughts of SI/HI -Mostly eats at home, not much exercise.  Current Medication: Outpatient Encounter Medications as of 12/05/2022  Medication Sig   gabapentin (NEURONTIN) 300 MG capsule Take 1 capsule (300 mg total) by mouth every 6 (six) hours as needed (Anxiety if not relieved by hydroxyzine or if this medicine is preferred).   ondansetron (ZOFRAN) 4 MG tablet Take 1 tablet (4 mg total) by mouth every 8 (eight) hours as needed for vomiting or nausea.   QUEtiapine (SEROQUEL) 100 MG tablet Take 1 tablet (100 mg total) by mouth at bedtime as needed (Sleep).   [DISCONTINUED] amphetamine-dextroamphetamine (ADDERALL) 30 MG tablet Take 30 mg by mouth 2 (two) times daily.   [DISCONTINUED] clonazePAM (KLONOPIN) 1 MG tablet Take 1 mg by mouth 2 (two) times daily as needed for anxiety.   amphetamine-dextroamphetamine (ADDERALL) 30 MG tablet Take 1 tablet by mouth daily.   clonazePAM (KLONOPIN) 1 MG tablet Take 1 tablet (1 mg total) by mouth daily as needed for anxiety.   No  facility-administered encounter medications on file as of 12/05/2022.    Surgical History: Past Surgical History:  Procedure Laterality Date   BREAST REDUCTION SURGERY  2005    Medical History: Past Medical History:  Diagnosis Date   Adult ADHD    Anxiety    Asthma    IBS (irritable bowel syndrome)    Opioid use disorder    No longer on buprenorphine therapy   PTSD (post-traumatic stress disorder)     Family History: Family History  Problem Relation Age of Onset   Anxiety disorder Mother    Diabetes Mother    Anxiety disorder Father     Social History   Socioeconomic History   Marital status: Legally Separated    Spouse name: Not on file   Number of children: Not on file   Years of education: Not on file   Highest education level: Not on file  Occupational History   Not on file  Tobacco Use   Smoking status: Every Day    Packs/day: 0.50    Types: Cigarettes    Last attempt to quit: 09/06/2014    Years since quitting: 8.2   Smokeless tobacco: Never  Substance and Sexual Activity   Alcohol use: No   Drug use: Not Currently   Sexual activity: Not Currently    Birth control/protection: Abstinence  Other Topics Concern   Not on file  Social History Narrative   41 y/o Caucasian female. Married and homeless with polysubstance abuse. Per pt-Graduated from [employer] in 2006  with bachelors degree in [place].   Social Determinants of Health   Financial Resource Strain: Not on file  Food Insecurity: Not on file  Transportation Needs: Not on file  Physical Activity: Not on file  Stress: Not on file  Social Connections: Not on file  Intimate Partner Violence: Not on file     Review of Systems  Constitutional:  Negative for chills, fatigue and unexpected weight change.  HENT:  Negative for congestion, postnasal drip, rhinorrhea, sneezing and sore throat.   Eyes:  Negative for redness.  Respiratory:  Negative for cough, chest tightness and  shortness of breath.   Cardiovascular:  Negative for chest pain and palpitations.  Gastrointestinal:  Negative for abdominal pain, constipation, diarrhea, nausea and vomiting.  Genitourinary:  Negative for dysuria and frequency.  Musculoskeletal:  Negative for arthralgias, back pain, joint swelling and neck pain.  Skin:  Negative for rash.  Neurological: Negative.  Negative for tremors and numbness.  Hematological:  Negative for adenopathy. Does not bruise/bleed easily.  Psychiatric/Behavioral:  Negative for behavioral problems (Depression), sleep disturbance and suicidal ideas. The patient is nervous/anxious.     Vital Signs: BP 100/70   Pulse 83   Temp 98.3 F (36.8 C)   Resp 16   Ht 5' 7.5" (1.715 m)   Wt 148 lb 6.4 oz (67.3 kg)   LMP 09/30/2022 (Approximate)   SpO2 98%   BMI 22.90 kg/m    Physical Exam Vitals and nursing note reviewed.  Constitutional:      General: She is not in acute distress.    Appearance: She is well-developed. She is not diaphoretic.  HENT:     Head: Normocephalic and atraumatic.     Mouth/Throat:     Pharynx: No oropharyngeal exudate.  Eyes:     Pupils: Pupils are equal, round, and reactive to light.  Neck:     Thyroid: No thyromegaly.     Vascular: No JVD.     Trachea: No tracheal deviation.  Cardiovascular:     Rate and Rhythm: Normal rate and regular rhythm.     Heart sounds: Normal heart sounds. No murmur heard.    No friction rub. No gallop.  Pulmonary:     Effort: Pulmonary effort is normal. No respiratory distress.     Breath sounds: No wheezing or rales.  Chest:     Chest wall: No tenderness.  Abdominal:     General: Bowel sounds are normal.     Palpations: Abdomen is soft.  Musculoskeletal:        General: Normal range of motion.     Cervical back: Normal range of motion and neck supple.  Lymphadenopathy:     Cervical: No cervical adenopathy.  Skin:    General: Skin is warm and dry.  Neurological:     Mental Status: She  is alert and oriented to person, place, and time.     Cranial Nerves: No cranial nerve deficit.  Psychiatric:        Thought Content: Thought content normal.        Judgment: Judgment normal.       Assessment/Plan: 1. Anxiety disorder, unspecified type Will give single script for 30tabs to be used as needed. All future refills must be through psychiatry--pt understands - Ambulatory referral to Psychiatry - clonazePAM (KLONOPIN) 1 MG tablet; Take 1 tablet (1 mg total) by mouth daily as needed for anxiety.  Dispense: 30 tablet; Refill: 0  2. PTSD (post-traumatic stress disorder) Will refer to  psychiatry - Ambulatory referral to Psychiatry  3. Attention deficit hyperactivity disorder (ADHD), unspecified ADHD type Single script for 30 tabs given to be used as needed. All future refills must be through psychiatry--pt understands - amphetamine-dextroamphetamine (ADDERALL) 30 MG tablet; Take 1 tablet by mouth daily.  Dispense: 30 tablet; Refill: 0  4. Encounter for Papanicolaou smear of cervix Requests OBGYN referral - Ambulatory referral to Obstetrics / Gynecology  5. Encounter for long-term current use of medication - POCT Urine Drug Screen   General Counseling: Britlee verbalizes understanding of the findings of todays visit and agrees with plan of treatment. I have discussed any further diagnostic evaluation that may be needed or ordered today. We also reviewed her medications today. she has been encouraged to call the office with any questions or concerns that should arise related to todays visit.    Counseling:    Orders Placed This Encounter  Procedures   Ambulatory referral to Psychiatry   Ambulatory referral to Obstetrics / Gynecology   POCT Urine Drug Screen    Meds ordered this encounter  Medications   amphetamine-dextroamphetamine (ADDERALL) 30 MG tablet    Sig: Take 1 tablet by mouth daily.    Dispense:  30 tablet    Refill:  0   clonazePAM (KLONOPIN) 1 MG  tablet    Sig: Take 1 tablet (1 mg total) by mouth daily as needed for anxiety.    Dispense:  30 tablet    Refill:  0     This patient was seen by Drema Dallas, PA-C in collaboration with Dr. Clayborn Bigness as a part of collaborative care agreement.   Time spent:35 Minutes

## 2022-12-05 NOTE — Telephone Encounter (Signed)
Awaiting 12/05/22 office notes for [facility] referral-Toni

## 2022-12-17 ENCOUNTER — Telehealth: Payer: Self-pay | Admitting: Physician Assistant

## 2022-12-17 NOTE — Telephone Encounter (Signed)
[facility] referral faxed to [facility]; [phone]

## 2023-01-03 ENCOUNTER — Telehealth: Payer: Self-pay | Admitting: Physician Assistant

## 2023-01-06 ENCOUNTER — Ambulatory Visit: Payer: 59 | Admitting: Physician Assistant

## 2023-01-08 NOTE — Telephone Encounter (Signed)
Error

## 2023-01-16 ENCOUNTER — Ambulatory Visit: Payer: 59 | Admitting: Nurse Practitioner

## 2023-01-17 ENCOUNTER — Ambulatory Visit: Payer: 59 | Admitting: Physician Assistant

## 2023-01-21 ENCOUNTER — Ambulatory Visit: Payer: 59 | Admitting: Nurse Practitioner

## 2023-01-21 ENCOUNTER — Encounter: Payer: 59 | Admitting: Obstetrics

## 2023-02-06 ENCOUNTER — Telehealth: Payer: Self-pay

## 2023-02-06 NOTE — Telephone Encounter (Addendum)
Pt called that we can refill her adderall,clonazepam as per lauren advised her that we are unable to refill her med gave her Saline phone or advised her go to ED  pt no show 3 times and canceled her appt 2 times  and pt was advised her at last visit that we are able to fill her med only 1 times

## 2023-02-07 ENCOUNTER — Telehealth: Payer: Self-pay | Admitting: Internal Medicine

## 2023-02-07 NOTE — Telephone Encounter (Signed)
Patient discharged from practice due to noncompliance with appointments. Letter mailed to patient-Toni

## 2023-03-12 ENCOUNTER — Encounter: Payer: Self-pay | Admitting: Emergency Medicine

## 2023-03-12 ENCOUNTER — Emergency Department (EMERGENCY_DEPARTMENT_HOSPITAL)
Admission: EM | Admit: 2023-03-12 | Discharge: 2023-03-13 | Disposition: A | Discharge status: Other Acute Inpatient Hospital | Payer: 59 | Source: Home / Self Care | Attending: Emergency Medicine | Admitting: Emergency Medicine

## 2023-03-12 DIAGNOSIS — F1594 Other stimulant use, unspecified with stimulant-induced mood disorder: Secondary | ICD-10-CM | POA: Diagnosis present

## 2023-03-12 DIAGNOSIS — R45851 Suicidal ideations: Secondary | ICD-10-CM | POA: Insufficient documentation

## 2023-03-12 DIAGNOSIS — F1510 Other stimulant abuse, uncomplicated: Secondary | ICD-10-CM | POA: Diagnosis present

## 2023-03-12 DIAGNOSIS — F1220 Cannabis dependence, uncomplicated: Secondary | ICD-10-CM | POA: Insufficient documentation

## 2023-03-12 DIAGNOSIS — Z720 Tobacco use: Secondary | ICD-10-CM | POA: Diagnosis present

## 2023-03-12 DIAGNOSIS — F419 Anxiety disorder, unspecified: Secondary | ICD-10-CM | POA: Diagnosis present

## 2023-03-12 DIAGNOSIS — Z87891 Personal history of nicotine dependence: Secondary | ICD-10-CM | POA: Insufficient documentation

## 2023-03-12 DIAGNOSIS — F1020 Alcohol dependence, uncomplicated: Secondary | ICD-10-CM | POA: Diagnosis present

## 2023-03-12 DIAGNOSIS — F331 Major depressive disorder, recurrent, moderate: Secondary | ICD-10-CM

## 2023-03-12 DIAGNOSIS — F1520 Other stimulant dependence, uncomplicated: Secondary | ICD-10-CM | POA: Diagnosis present

## 2023-03-12 DIAGNOSIS — Z765 Malingerer [conscious simulation]: Secondary | ICD-10-CM

## 2023-03-12 DIAGNOSIS — Z1152 Encounter for screening for COVID-19: Secondary | ICD-10-CM | POA: Insufficient documentation

## 2023-03-12 DIAGNOSIS — F1111 Opioid abuse, in remission: Secondary | ICD-10-CM | POA: Diagnosis present

## 2023-03-12 DIAGNOSIS — J45909 Unspecified asthma, uncomplicated: Secondary | ICD-10-CM | POA: Insufficient documentation

## 2023-03-12 DIAGNOSIS — F15959 Other stimulant use, unspecified with stimulant-induced psychotic disorder, unspecified: Secondary | ICD-10-CM | POA: Insufficient documentation

## 2023-03-12 DIAGNOSIS — Z5900 Homelessness unspecified: Secondary | ICD-10-CM

## 2023-03-12 DIAGNOSIS — F15950 Other stimulant use, unspecified with stimulant-induced psychotic disorder with delusions: Secondary | ICD-10-CM | POA: Diagnosis present

## 2023-03-12 HISTORY — DX: Unspecified viral hepatitis C without hepatic coma: B19.20

## 2023-03-12 LAB — COMPREHENSIVE METABOLIC PANEL
ALT: 35 U/L (ref 0–44)
AST: 25 U/L (ref 15–41)
Albumin: 4.7 g/dL (ref 3.5–5.0)
Alkaline Phosphatase: 54 U/L (ref 38–126)
Anion gap: 10 (ref 5–15)
BUN: 13 mg/dL (ref 6–20)
CO2: 26 mmol/L (ref 22–32)
Calcium: 9.6 mg/dL (ref 8.9–10.3)
Chloride: 103 mmol/L (ref 98–111)
Creatinine, Ser: 0.84 mg/dL (ref 0.44–1.00)
GFR, Estimated: >60 mL/min (ref >60)
Glucose, Bld: 100 mg/dL — ABNORMAL HIGH (ref 70–99)
Potassium: 4.1 mmol/L (ref 3.5–5.1)
Sodium: 139 mmol/L (ref 135–145)
Total Bilirubin: 0.7 mg/dL (ref 0.3–1.2)
Total Protein: 8.9 g/dL — ABNORMAL HIGH (ref 6.5–8.1)

## 2023-03-12 LAB — CBC
HCT: 43.7 % (ref 36.0–46.0)
Hemoglobin: 14.6 g/dL (ref 12.0–15.0)
MCH: 30.5 pg (ref 26.0–34.0)
MCHC: 33.4 g/dL (ref 30.0–36.0)
MCV: 91.4 fL (ref 80.0–100.0)
Platelets: 478 10*3/uL — ABNORMAL HIGH (ref 150–400)
RBC: 4.78 MIL/uL (ref 3.87–5.11)
RDW: 13.2 % (ref 11.5–15.5)
WBC: 8.2 10*3/uL (ref 4.0–10.5)
nRBC: 0 % (ref 0.0–0.2)

## 2023-03-12 NOTE — ED Notes (Addendum)
Pt dressed out by this RN & EDT Jodi Mourning) belongings include:  [employer] Shark boots Blue jeans Blue top Herbalist bracelet Green ring Pearline Cables ring Blue earrings Orange tank top [facility]  White socks Black hair tie Jewelry placed in a labeled urine specimen container.

## 2023-03-12 NOTE — ED Triage Notes (Signed)
Pt presents ambulatory to triage via [facility] under IVC for SI. Pt states she wanted to hang herself because she and her BF broke up. Hx of depression and states she hasn't been doing well. A&Ox4 at this time. Denies CP or SOB.

## 2023-03-13 ENCOUNTER — Other Ambulatory Visit: Payer: Self-pay

## 2023-03-13 ENCOUNTER — Inpatient Hospital Stay
Admission: AD | Admit: 2023-03-13 | Discharge: 2023-03-18 | DRG: 885 | Disposition: A | Discharge status: Home / Self Care | Payer: 59 | Source: Intra-hospital | Attending: Psychiatry | Admitting: Psychiatry

## 2023-03-13 DIAGNOSIS — Z20822 Contact with and (suspected) exposure to covid-19: Secondary | ICD-10-CM | POA: Diagnosis present

## 2023-03-13 DIAGNOSIS — G4700 Insomnia, unspecified: Secondary | ICD-10-CM | POA: Diagnosis present

## 2023-03-13 DIAGNOSIS — R45851 Suicidal ideations: Secondary | ICD-10-CM | POA: Diagnosis not present

## 2023-03-13 DIAGNOSIS — J45909 Unspecified asthma, uncomplicated: Secondary | ICD-10-CM | POA: Diagnosis not present

## 2023-03-13 DIAGNOSIS — F15959 Other stimulant use, unspecified with stimulant-induced psychotic disorder, unspecified: Secondary | ICD-10-CM | POA: Diagnosis not present

## 2023-03-13 DIAGNOSIS — F331 Major depressive disorder, recurrent, moderate: Principal | ICD-10-CM | POA: Diagnosis present

## 2023-03-13 DIAGNOSIS — Z818 Family history of other mental and behavioral disorders: Secondary | ICD-10-CM

## 2023-03-13 DIAGNOSIS — F419 Anxiety disorder, unspecified: Secondary | ICD-10-CM | POA: Diagnosis present

## 2023-03-13 DIAGNOSIS — Z5900 Homelessness unspecified: Secondary | ICD-10-CM

## 2023-03-13 DIAGNOSIS — F17210 Nicotine dependence, cigarettes, uncomplicated: Secondary | ICD-10-CM | POA: Diagnosis not present

## 2023-03-13 DIAGNOSIS — Z635 Disruption of family by separation and divorce: Secondary | ICD-10-CM | POA: Diagnosis not present

## 2023-03-13 DIAGNOSIS — F1220 Cannabis dependence, uncomplicated: Secondary | ICD-10-CM | POA: Diagnosis present

## 2023-03-13 LAB — RESP PANEL BY RT-PCR (RSV, FLU A&B, COVID)  RVPGX2
Influenza A by PCR: NEGATIVE
Influenza B by PCR: NEGATIVE
Resp Syncytial Virus by PCR: NEGATIVE
SARS Coronavirus 2 by RT PCR: NEGATIVE

## 2023-03-13 LAB — ACETAMINOPHEN LEVEL: Acetaminophen (Tylenol), Serum: <10 ug/mL — ABNORMAL LOW (ref 10–30)

## 2023-03-13 LAB — HCG, QUANTITATIVE, PREGNANCY: hCG, Beta Chain, Quant, S: <1 m[IU]/mL (ref <5)

## 2023-03-13 LAB — SALICYLATE LEVEL: Salicylate Lvl: <7 mg/dL — ABNORMAL LOW (ref 7.0–30.0)

## 2023-03-13 LAB — ETHANOL: Alcohol, Ethyl (B): <10 mg/dL (ref <10)

## 2023-03-13 MED ORDER — ACETAMINOPHEN 325 MG PO TABS: 650.0000 mg | ORAL_TABLET | Freq: Four times a day (QID) | ORAL | Status: DC | PRN

## 2023-03-13 MED ORDER — DIPHENHYDRAMINE HCL 25 MG PO CAPS: 50.0000 mg | ORAL_CAPSULE | Freq: Three times a day (TID) | ORAL | Status: DC | PRN

## 2023-03-13 MED ORDER — MAGNESIUM HYDROXIDE 400 MG/5ML PO SUSP: 30.0000 mL | Freq: Every day | ORAL | Status: DC | PRN

## 2023-03-13 MED ORDER — LORAZEPAM 2 MG PO TABS: 2.0000 mg | ORAL_TABLET | Freq: Three times a day (TID) | ORAL | Status: DC | PRN

## 2023-03-13 MED ORDER — BENZTROPINE MESYLATE 1 MG PO TABS: 1.0000 mg | ORAL_TABLET | Freq: Four times a day (QID) | ORAL | Status: DC | PRN

## 2023-03-13 MED ORDER — ALUM & MAG HYDROXIDE-SIMETH 200-200-20 MG/5ML PO SUSP: 30.0000 mL | ORAL | Status: DC | PRN

## 2023-03-13 MED ORDER — HALOPERIDOL 5 MG PO TABS: 5.0000 mg | ORAL_TABLET | Freq: Three times a day (TID) | ORAL | Status: DC | PRN

## 2023-03-13 MED ORDER — HALOPERIDOL 5 MG PO TABS: 5.0000 mg | ORAL_TABLET | Freq: Four times a day (QID) | ORAL | Status: DC | PRN

## 2023-03-13 MED ORDER — RISPERIDONE 1 MG PO TBDP: 2.0000 mg | ORAL_TABLET | Freq: Three times a day (TID) | ORAL | Status: DC | PRN

## 2023-03-13 MED ORDER — ZIPRASIDONE MESYLATE 20 MG IM SOLR: 20.0000 mg | INTRAMUSCULAR | Status: DC | PRN

## 2023-03-13 MED ORDER — HALOPERIDOL LACTATE 5 MG/ML IJ SOLN: 5.0000 mg | Freq: Three times a day (TID) | INTRAMUSCULAR | Status: DC | PRN

## 2023-03-13 MED ORDER — LORAZEPAM 2 MG/ML IJ SOLN: 2.0000 mg | Freq: Three times a day (TID) | INTRAMUSCULAR | Status: DC | PRN

## 2023-03-13 MED ORDER — LORAZEPAM 1 MG PO TABS: 1.0000 mg | ORAL_TABLET | ORAL | Status: DC | PRN

## 2023-03-13 MED ORDER — DIPHENHYDRAMINE HCL 50 MG/ML IJ SOLN: 50.0000 mg | Freq: Three times a day (TID) | INTRAMUSCULAR | Status: DC | PRN

## 2023-03-13 NOTE — ED Notes (Signed)
Hospital meal provided, pt tolerated w/o complaints.  Waste discarded appropriately.  

## 2023-03-13 NOTE — ED Notes (Signed)
Introduced myself to patient. Patient is calm and cooperative. Offered the patient a blanket.
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
## 2023-03-13 NOTE — BHH Suicide Risk Assessment (Signed)
[place] INPATIENT:  Family/Significant Other Suicide Prevention Education  Suicide Prevention Education:  Patient Refusal for Family/Significant Other Suicide Prevention Education: The patient Taylor Wiggins has refused to provide written consent for family/significant other to be provided Family/Significant Other Suicide Prevention Education during admission and/or prior to discharge.  Physician notified.  Durenda Hurt 03/13/2023, 5:20 PM

## 2023-03-13 NOTE — ED Notes (Signed)
ivc/pending consult/Per pysch NP [place] Silos patient [place] recommended for inpatient.Marland Kitchen

## 2023-03-13 NOTE — Tx Team (Signed)
Initial Treatment Plan 03/13/2023 12:10 PM [name] [id number]    PATIENT STRESSORS: Marital or family conflict     PATIENT STRENGTHS: Motivation for treatment/growth    PATIENT IDENTIFIED PROBLEMS: Anxiety, depression, family conflict                     DISCHARGE CRITERIA:  Improved stabilization in mood, thinking, and/or behavior  PRELIMINARY DISCHARGE PLAN: Return to previous living arrangement  PATIENT/FAMILY INVOLVEMENT: This treatment plan has been presented to and reviewed with the patient, Taylor Wiggins.  The patient and family have been given the opportunity to ask questions and make suggestions.  Donette Larry, RN 03/13/2023, 12:10 PM
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
## 2023-03-13 NOTE — ED Notes (Signed)
ivc/consult done/recommend psychiatric inpatient admission when medically cleared.. 

## 2023-03-13 NOTE — Group Note (Signed)
Recreation Therapy Group Note   Group Topic:Coping Skills  Group Date: 03/13/2023 Start Time: 1000 End Time: 1045 Facilitators: Vilma Prader, LRT, CTRS Location:  C[facility] Room  Group Description: Mind Map.  Patient was provided a blank template of a diagram with 32 blank boxes in a tiered system, branching from the center (similar to a bubble chart). LRT directed patients to label the middle of the diagram "Coping Skills". LRT and patients then came up with 8 different coping skills as examples. Pt were directed to record their coping skills in the 2nd tier boxes closest to the center.  Patients would then share their coping skills with the group as LRT wrote them out. LRT gave a handout of 100 different coping skills at the end of group.   Affect/Mood: N/A   Participation Level: Did not attend    Clinical Observations/Individualized Feedback: Taylor Wiggins did not attend group due to not being on the unit yet.   Plan: Continue to engage patient in RT group sessions 2-3x/week.   Vilma Prader, LRT, CTRS 03/13/2023 10:58 AM

## 2023-03-13 NOTE — ED Notes (Signed)
ivc/psych consult ordered/pending.Marland Kitchen

## 2023-03-13 NOTE — ED Notes (Signed)
Currently speaking with NP for TTS.
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
## 2023-03-14 DIAGNOSIS — F331 Major depressive disorder, recurrent, moderate: Principal | ICD-10-CM

## 2023-03-14 MED ORDER — GABAPENTIN 300 MG PO CAPS
300.0000 mg | ORAL_CAPSULE | Freq: Every day | ORAL | Status: DC
  Administered 2023-03-14 – 2023-03-17 (×3): 300 mg via ORAL
  Filled 2023-03-14 (×4): qty 1

## 2023-03-14 MED ORDER — ESCITALOPRAM OXALATE 10 MG PO TABS
10.0000 mg | ORAL_TABLET | Freq: Every day | ORAL | Status: DC
  Administered 2023-03-14 – 2023-03-18 (×5): 10 mg via ORAL
  Filled 2023-03-14 (×5): qty 1

## 2023-03-14 NOTE — BH IP Treatment Plan (Signed)
Interdisciplinary Treatment and Diagnostic Plan Update  03/14/2023 Time of Session: 9:02 AM NIYOMI PARK MRN: [id number]  Principal Diagnosis: MDD (major depressive disorder), recurrent episode, moderate ([place])  Secondary Diagnoses: Principal Problem:   MDD (major depressive disorder), recurrent episode, moderate (HCC)   Current Medications:  Current Facility-Administered Medications  Medication Dose Route Frequency Provider Last Rate Last Admin   acetaminophen (TYLENOL) tablet 650 mg  650 mg Oral Q6H PRN Caroline Sauger, NP       alum & mag hydroxide-simeth (MAALOX/MYLANTA) 200-200-20 MG/5ML suspension 30 mL  30 mL Oral Q4H PRN Caroline Sauger, NP       haloperidol (HALDOL) tablet 5 mg  5 mg Oral Q6H PRN Caroline Sauger, NP       And   benztropine (COGENTIN) tablet 1 mg  1 mg Oral Q6H PRN Caroline Sauger, NP       diphenhydrAMINE (BENADRYL) capsule 50 mg  50 mg Oral TID PRN Caroline Sauger, NP       Or   diphenhydrAMINE (BENADRYL) injection 50 mg  50 mg Intramuscular TID PRN Caroline Sauger, NP       haloperidol (HALDOL) tablet 5 mg  5 mg Oral TID PRN Caroline Sauger, NP       Or   haloperidol lactate (HALDOL) injection 5 mg  5 mg Intramuscular TID PRN Caroline Sauger, NP       LORazepam (ATIVAN) tablet 2 mg  2 mg Oral TID PRN Caroline Sauger, NP       Or   LORazepam (ATIVAN) injection 2 mg  2 mg Intramuscular TID PRN Caroline Sauger, NP       risperiDONE (RISPERDAL M-TABS) disintegrating tablet 2 mg  2 mg Oral Q8H PRN Caroline Sauger, NP       And   LORazepam (ATIVAN) tablet 1 mg  1 mg Oral PRN Caroline Sauger, NP       And   ziprasidone (GEODON) injection 20 mg  20 mg Intramuscular PRN Caroline Sauger, NP       magnesium hydroxide (MILK OF MAGNESIA) suspension 30 mL  30 mL Oral Daily PRN Caroline Sauger, NP       PTA Medications: Medications Prior to Admission  Medication Sig Dispense Refill Last Dose    gabapentin (NEURONTIN) 300 MG capsule Take 1 capsule (300 mg total) by mouth every 6 (six) hours as needed (Anxiety if not relieved by hydroxyzine or if this medicine is preferred). 60 capsule 1 03/12/2023   ondansetron (ZOFRAN) 4 MG tablet Take 1 tablet (4 mg total) by mouth every 8 (eight) hours as needed for vomiting or nausea. 20 tablet 0 prn at unk   amphetamine-dextroamphetamine (ADDERALL) 30 MG tablet Take 1 tablet by mouth daily. 30 tablet 0 12/30/2022   clonazePAM (KLONOPIN) 1 MG tablet Take 1 tablet (1 mg total) by mouth daily as needed for anxiety. 30 tablet 0 12/30/2022   QUEtiapine (SEROQUEL) 100 MG tablet Take 1 tablet (100 mg total) by mouth at bedtime as needed (Sleep). (Patient not taking: Reported on 03/13/2023) 30 tablet 1 Not Taking    Patient Stressors: Marital or family conflict    Patient Strengths: Motivation for treatment/growth   Treatment Modalities: Medication Management, Group therapy, Case management,  1 to 1 session with clinician, Psychoeducation, Recreational therapy.   Physician Treatment Plan for Primary Diagnosis: MDD (major depressive disorder), recurrent episode, moderate ([place]) Long Term Goal(s):     Short Term Goals:    Medication Management: Evaluate patient's response, side effects, and tolerance  of medication regimen.  Therapeutic Interventions: 1 to 1 sessions, Unit Group sessions and Medication administration.  Evaluation of Outcomes: Progressing  Physician Treatment Plan for Secondary Diagnosis: Principal Problem:   MDD (major depressive disorder), recurrent episode, moderate ([place])  Long Term Goal(s):     Short Term Goals:       Medication Management: Evaluate patient's response, side effects, and tolerance of medication regimen.  Therapeutic Interventions: 1 to 1 sessions, Unit Group sessions and Medication administration.  Evaluation of Outcomes: Progressing   RN Treatment Plan for Primary Diagnosis: MDD (major depressive disorder),  recurrent episode, moderate (HCC) Long Term Goal(s): Knowledge of disease and therapeutic regimen to maintain health will improve  Short Term Goals: Ability to demonstrate self-control, Ability to participate in decision making will improve, Ability to verbalize feelings will improve, Ability to disclose and discuss suicidal ideas, Ability to identify and develop effective coping behaviors will improve, and Compliance with prescribed medications will improve  Medication Management: RN will administer medications as ordered by provider, will assess and evaluate patient's response and provide education to patient for prescribed medication. RN will report any adverse and/or side effects to prescribing provider.  Therapeutic Interventions: 1 on 1 counseling sessions, Psychoeducation, Medication administration, Evaluate responses to treatment, Monitor vital signs and CBGs as ordered, Perform/monitor CIWA, COWS, AIMS and Fall Risk screenings as ordered, Perform wound care treatments as ordered.  Evaluation of Outcomes: Progressing   LCSW Treatment Plan for Primary Diagnosis: MDD (major depressive disorder), recurrent episode, moderate ([place]) Long Term Goal(s): Safe transition to appropriate next level of care at discharge, Engage patient in therapeutic group addressing interpersonal concerns.  Short Term Goals: Engage patient in aftercare planning with referrals and resources, Increase social support, Increase ability to appropriately verbalize feelings, Increase emotional regulation, Facilitate acceptance of mental health diagnosis and concerns, and Increase skills for wellness and recovery  Therapeutic Interventions: Assess for all discharge needs, 1 to 1 time with Social worker, Explore available resources and support systems, Assess for adequacy in community support network, Educate family and significant other(s) on suicide prevention, Complete Psychosocial Assessment, Interpersonal group  therapy.  Evaluation of Outcomes: Progressing   Progress in Treatment: Attending groups: Yes. Participating in groups: Yes. Taking medication as prescribed: Yes. Toleration medication: Yes. Family/Significant other contact made: No, will contact:  once permission is given.  Patient understands diagnosis: Yes. Discussing patient identified problems/goals with staff: Yes. Medical problems stabilized or resolved: Yes. Denies suicidal/homicidal ideation: Yes. Issues/concerns per patient self-inventory: No. Other: none  New problem(s) identified: No, Describe:  none  New Short Term/Long Term Goal(s): detox, elimination of symptoms of psychosis, medication management for mood stabilization; elimination of SI thoughts; development of comprehensive mental wellness/sobriety plan.   Patient Goals:  "get back on my medications for depression and anxiety"  Discharge Plan or Barriers: CSW to assist in the development of appropriate discharge plans.   Reason for Continuation of Hospitalization: Anxiety Depression Medication stabilization Suicidal ideation  Estimated Length of Stay: 1-7 days  Last 3 [place] Suicide Severity Risk Score: Flowsheet Row Admission (Current) from 03/13/2023 in [place] [facility] from 03/12/2023 in [facility] ED from 11/24/2022 in [facility] Emergency Department at [place] High Risk High Risk No Risk       Last PHQ 2/9 Scores:    12/05/2022    2:54 PM 07/19/2022    8:06 PM 07/06/2022    9:49 AM  Depression screen PHQ 2/9  Decreased Interest 0 2 2  Down, Depressed, Hopeless 0 3 1  PHQ - 2 Score 0 5 3  Altered sleeping  2 2  Tired, decreased energy  2 2  Change in appetite  1 1  Feeling bad or failure about yourself   3 2  Trouble concentrating  2 1  Moving slowly or fidgety/restless  0 0  Suicidal thoughts  1 1  PHQ-9 Score  16 12  Difficult doing work/chores   Extremely dIfficult Somewhat difficult    Scribe for Treatment Team: Rozann Lesches, Marlinda Mike 03/14/2023 11:33 AM

## 2023-03-14 NOTE — Progress Notes (Signed)
This writer went to check with patient to see if she has provided a urine sample, and she stated that she is increasing her fluids in order to use the bathroom.

## 2023-03-14 NOTE — Group Note (Signed)
Recreation Therapy Group Note   Group Topic:Emotion Expression  Group Date: 03/14/2023 Start Time: 1000 End Time: 1030 Facilitators: Vilma Prader, LRT, CTRS Location:  Craft Room  Group Description: Gratitude Journaling. Patients and LRT discussed what gratitude means, how we can express it and what it means to [place], personally. LRT gave an education handout on the definition of gratitude that also gave different examples of gratitude exercises that they could try. One of the examples was "Gratitude Letter", which prompted you to write a letter to someone you appreciate. LRT played soft music while everyone wrote their letter. Once letter was completed, LRT encouraged people to read their letter, if they wanted to, or share who they wrote it to, at minimum. LRT and pts processed how showing gratitude towards themselves, and others can be applied to everyday life post-discharge.    Affect/Mood: N/A   Participation Level: Did not attend    Clinical Observations/Individualized Feedback: Taylor Wiggins did not attend group due to resting in her room.  Plan: Continue to engage patient in RT group sessions 2-3x/week.   Vilma Prader, LRT, CTRS 03/14/2023 10:38 AM

## 2023-03-14 NOTE — [facility] Suicide Risk Assessment (Signed)
[facility] Admission Suicide Risk Assessment   Nursing information obtained from:  Patient, Review of record Demographic factors:  Caucasian, Unemployed Current Mental Status:  NA Loss Factors:  Loss of significant relationship Historical Factors:  NA Risk Reduction Factors:  Living with another person, especially a relative  Total Time spent with patient: 45 minutes Principal Problem: MDD (major depressive disorder), recurrent episode, moderate (HCC) Diagnosis:  Principal Problem:   MDD (major depressive disorder), recurrent episode, moderate ([place])  Subjective Data: Patient seen and chart reviewed.  42 year old woman with a history of mood symptoms and substance abuse came to the hospital reporting depression with suicidal ideation.  Patient is still having suicidal thoughts but without any intent or plan of acting on it in the hospital and she denies having any current psychotic symptoms.  Patient is open to and requesting treatment.  Continued Clinical Symptoms:  Alcohol Use Disorder Identification Test Final Score (AUDIT): 0 The "Alcohol Use Disorders Identification Test", Guidelines for Use in Primary Care, Second Edition.  World Pharmacologist [facility]). Score between 0-7:  no or low risk or alcohol related problems. Score between 8-15:  moderate risk of alcohol related problems. Score between 16-19:  high risk of alcohol related problems. Score 20 or above:  warrants further diagnostic evaluation for alcohol dependence and treatment.   CLINICAL FACTORS:   Depression:   Anhedonia Hopelessness Alcohol/Substance Abuse/Dependencies   Musculoskeletal: Strength & Muscle Tone: within normal limits Gait & Station: normal Patient leans: N/A  Psychiatric Specialty Exam:  Presentation  General Appearance:  Bizarre  Eye Contact: Good  Speech: Clear and Coherent  Speech Volume: Normal  Handedness: Right   Mood and Affect  Mood: Euthymic  Affect: Congruent   Thought  Process  Thought Processes: Coherent  Descriptions of Associations:Intact  Orientation:Full (Time, Place and Person)  Thought Content:Logical  History of Schizophrenia/Schizoaffective disorder:No  Duration of Psychotic Symptoms:N/A  Hallucinations:Hallucinations: None  Ideas of Reference:None  Suicidal Thoughts:Suicidal Thoughts: Yes, Active SI Active Intent and/or Plan: With Intent; With Plan; With Means to Carry Out  Homicidal Thoughts:Homicidal Thoughts: No   Sensorium  Memory: Immediate Good; Recent Good; Remote Good  Judgment: Fair  Insight: Fair   Materials engineer: Fair  Attention Span: Fair  Recall: [employer] of Knowledge: Fair  Language: Fair   Psychomotor Activity  Psychomotor Activity: Psychomotor Activity: Normal   Assets  Assets: Catering manager; Housing; Social Support; Resilience   Sleep  Sleep: Sleep: Good Number of Hours of Sleep: 8    Physical Exam: Physical Exam Vitals and nursing note reviewed.  Constitutional:      Appearance: Normal appearance.  HENT:     Head: Normocephalic and atraumatic.     Mouth/Throat:     Pharynx: Oropharynx is clear.  Eyes:     Pupils: Pupils are equal, round, and reactive to light.  Cardiovascular:     Rate and Rhythm: Normal rate and regular rhythm.  Pulmonary:     Effort: Pulmonary effort is normal.     Breath sounds: Normal breath sounds.  Abdominal:     General: Abdomen is flat.     Palpations: Abdomen is soft.  Musculoskeletal:        General: Normal range of motion.  Skin:    General: Skin is warm and dry.  Neurological:     General: No focal deficit present.     Mental Status: She is alert. Mental status is at baseline.  Psychiatric:  Attention and Perception: Attention normal.        Mood and Affect: Mood is depressed. Affect is blunt.        Speech: Speech is delayed.        Behavior: Behavior is withdrawn.        Thought  Content: Thought content includes suicidal ideation. Thought content includes suicidal plan.        Cognition and Memory: Cognition is impaired. Memory is impaired.        Judgment: Judgment is impulsive.    Review of Systems  Constitutional: Negative.   HENT: Negative.    Eyes: Negative.   Respiratory: Negative.    Cardiovascular: Negative.   Gastrointestinal: Negative.   Musculoskeletal: Negative.   Skin: Negative.   Neurological: Negative.   Psychiatric/Behavioral:  Positive for depression and suicidal ideas. Negative for hallucinations and substance abuse. The patient is nervous/anxious and has insomnia.    Blood pressure 101/69, pulse 81, temperature 98.1 F (36.7 C), temperature source Oral, resp. rate 18, height 5\' 7"  (1.702 m), weight 61.7 kg, last menstrual period 02/19/2023, SpO2 99 %. Body mass index is 21.3 kg/m.   COGNITIVE FEATURES THAT CONTRIBUTE TO RISK:  Thought constriction (tunnel vision)    SUICIDE RISK:   Mild:  Suicidal ideation of limited frequency, intensity, duration, and specificity.  There are no identifiable plans, no associated intent, mild dysphoria and related symptoms, good self-control (both objective and subjective assessment), few other risk factors, and identifiable protective factors, including available and accessible social support.  PLAN OF CARE: Reviewed medication options and start antidepressant medicine.  Ongoing 15-minute checks ongoing daily assessment of dangerousness and mood.  I certify that inpatient services furnished can reasonably be expected to improve the patient's condition.   Alethia Berthold, MD 03/14/2023, 3:07 PM

## 2023-03-14 NOTE — Group Note (Signed)
LCSW Group Therapy Note  Group Date: 03/13/2023 Start Time: 1300 End Time: 1400   Type of Therapy and Topic:  Group Therapy - How To Cope with Nervousness about Discharge   Participation Level:  Did Not Attend   Description of Group This process group involved identification of patients' feelings about discharge. Some of them are scheduled to be discharged soon, while others are new admissions, but each of them was asked to share thoughts and feelings surrounding discharge from the hospital. One common theme was that they are excited at the prospect of going home, while another was that many of them are apprehensive about sharing why they were hospitalized. Patients were given the opportunity to discuss these feelings with their peers in preparation for discharge.  Therapeutic Goals  Patient will identify their overall feelings about pending discharge. Patient will think about how they might proactively address issues that they believe will once again arise once they get home (i.e. with parents). Patients will participate in discussion about having hope for change.   Summary of Patient Progress: Patient did not attend group despite encouraged participation.   Therapeutic Modalities Cognitive Behavioral Therapy   Larose Kells 03/14/2023  2:26 PM

## 2023-03-14 NOTE — Progress Notes (Signed)
   03/13/23 2000  Psych Admission Type (Psych Patients Only)  Admission Status Involuntary  Psychosocial Assessment  Patient Complaints Anxiety;Depression  Eye Contact Fair  Facial Expression Sad  Affect Depressed  Speech Soft  Interaction Cautious  Motor Activity Slow  Appearance/Hygiene Unremarkable  Behavior Characteristics Appropriate to situation  Mood Depressed  Thought Process  Coherency WDL  Content WDL  Delusions None reported or observed  Perception Depersonalization  Hallucination None reported or observed  Judgment Limited  Danger to Self  Current suicidal ideation? Denies  Danger to Others  Danger to Others None reported or observed   Patient isolative to her room most of this shift denies SI/HI/A/VH and verbally contracted for safety Support and encouragement provided.
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
## 2023-03-14 NOTE — Plan of Care (Signed)
D- Patient alert and oriented. Patient presented in a sad, but pleasant mood on assessment stating that she slept "ok" last night and had no complaints to voice to this Probation officer. Patient endorsed both depression and anxiety, rating them an "8/10" and "7/10", stating that "what's going on with my situation outside of here", is why she's feeling this way. Patient denied SI, HI, AVH, and pain at this time. Patient had no stated goals for today.  A- Scheduled medications administered to patient, per MD orders. Support and encouragement provided.  Routine safety checks conducted every 15 minutes.  Patient informed to notify staff with problems or concerns.  R- No adverse drug reactions noted. Patient contracts for safety at this time. Patient compliant with medications and treatment plan. Patient receptive, calm, and cooperative. Patient interacts well with others on the unit.  Patient remains safe at this time.  Problem: Education: Goal: Knowledge of General Education information will improve Description: Including pain rating scale, medication(s)/side effects and non-pharmacologic comfort measures Outcome: Progressing   Problem: Health Behavior/Discharge Planning: Goal: Ability to manage health-related needs will improve Outcome: Progressing   Problem: Clinical Measurements: Goal: Ability to maintain clinical measurements within normal limits will improve Outcome: Progressing Goal: Will remain free from infection Outcome: Progressing Goal: Diagnostic test results will improve Outcome: Progressing Goal: Respiratory complications will improve Outcome: Progressing Goal: Cardiovascular complication will be avoided Outcome: Progressing   Problem: Activity: Goal: Risk for activity intolerance will decrease Outcome: Progressing   Problem: Nutrition: Goal: Adequate nutrition will be maintained Outcome: Progressing   Problem: Coping: Goal: Level of anxiety will decrease Outcome: Progressing    Problem: Elimination: Goal: Will not experience complications related to bowel motility Outcome: Progressing Goal: Will not experience complications related to urinary retention Outcome: Progressing   Problem: Pain Managment: Goal: General experience of comfort will improve Outcome: Progressing   Problem: Safety: Goal: Ability to remain free from injury will improve Outcome: Progressing   Problem: Skin Integrity: Goal: Risk for impaired skin integrity will decrease Outcome: Progressing   Problem: Education: Goal: Knowledge of [place] General Education information/materials will improve Outcome: Progressing Goal: Emotional status will improve Outcome: Progressing Goal: Mental status will improve Outcome: Progressing Goal: Verbalization of understanding the information provided will improve Outcome: Progressing   Problem: Activity: Goal: Interest or engagement in activities will improve Outcome: Progressing Goal: Sleeping patterns will improve Outcome: Progressing   Problem: Coping: Goal: Ability to verbalize frustrations and anger appropriately will improve Outcome: Progressing Goal: Ability to demonstrate self-control will improve Outcome: Progressing   Problem: Health Behavior/Discharge Planning: Goal: Identification of resources available to assist in meeting health care needs will improve Outcome: Progressing Goal: Compliance with treatment plan for underlying cause of condition will improve Outcome: Progressing   Problem: Physical Regulation: Goal: Ability to maintain clinical measurements within normal limits will improve Outcome: Progressing   Problem: Safety: Goal: Periods of time without injury will increase Outcome: Progressing

## 2023-03-14 NOTE — Progress Notes (Signed)
Patient was given a specimen cup to provide a urine sample, per MD orders. This Probation officer asked patient to bring up the sample when she is able to provide it. Patient verbalized understanding.

## 2023-03-15 NOTE — Plan of Care (Signed)
  Problem: Education: Goal: Knowledge of General Education information will improve Description: Including pain rating scale, medication(s)/side effects and non-pharmacologic comfort measures Outcome: Progressing   Problem: Nutrition: Goal: Adequate nutrition will be maintained Outcome: Progressing   Problem: Coping: Goal: Level of anxiety will decrease Outcome: Progressing   Problem: Education: Goal: Knowledge of [place] General Education information/materials will improve Outcome: Progressing Goal: Emotional status will improve Outcome: Progressing Goal: Mental status will improve Outcome: Progressing   

## 2023-03-15 NOTE — Group Note (Signed)
[place] LCSW Group Therapy Note   Group Date: 03/15/2023 Start Time: [id number] End Time: 1411   Type of Therapy/Topic:  Group Therapy:  Emotion Regulation  Participation Level:  Did Not Attend   Mood:  Description of Group:    The purpose of this group is to assist patients in learning to regulate negative emotions and experience positive emotions. Patients will be guided to discuss ways in which they have been vulnerable to their negative emotions. These vulnerabilities will be juxtaposed with experiences of positive emotions or situations, and patients challenged to use positive emotions to combat negative ones. Special emphasis will be placed on coping with negative emotions in conflict situations, and patients will process healthy conflict resolution skills.  Therapeutic Goals: Patient will identify two positive emotions or experiences to reflect on in order to balance out negative emotions:  Patient will label two or more emotions that they find the most difficult to experience:  Patient will be able to demonstrate positive conflict resolution skills through discussion or role plays:   Summary of Patient Progress:   X    Therapeutic Modalities:   Cognitive Behavioral Therapy Feelings Identification Dialectical Behavioral Therapy   Rozann Lesches, LCSW

## 2023-03-15 NOTE — Progress Notes (Signed)
[facility] MD Progress Note  03/15/2023 12:55 PM Taylor Wiggins  MRN:  [id number] Subjective: Follow-up 42 year old woman with depression.  She is in bed today but easily arousable.  Patient states she is still feeling depressed but denies current suicidal ideation.  Says she is still feeling very tired and like she needs to get more sleep.  I do not think she had breakfast today but she said she would consider getting up for lunch.  No complaints about medicine.  No new labs back. Principal Problem: MDD (major depressive disorder), recurrent episode, moderate (HCC) Diagnosis: Principal Problem:   MDD (major depressive disorder), recurrent episode, moderate (HCC)  Total Time spent with patient: 30 minutes  Past Psychiatric History: Past history of recurrent severe depression complicated by substance abuse  Past Medical History:  Past Medical History:  Diagnosis Date   Adult ADHD    Anxiety    Asthma    Hepatitis C    IBS (irritable bowel syndrome)    Opioid use disorder    No longer on buprenorphine therapy   PTSD (post-traumatic stress disorder)     Past Surgical History:  Procedure Laterality Date   BREAST REDUCTION SURGERY  2005   Family History:  Family History  Problem Relation Age of Onset   Anxiety disorder Mother    Diabetes Mother    Anxiety disorder Father    Family Psychiatric  History: See previous Social History:  Social History   Substance and Sexual Activity  Alcohol Use No     Social History   Substance and Sexual Activity  Drug Use Not Currently   Types: Methamphetamines   Comment: no methamphetamine use in 30 days    Social History   Socioeconomic History   Marital status: Legally Separated    Spouse name: Not on file   Number of children: Not on file   Years of education: Not on file   Highest education level: Not on file  Occupational History   Not on file  Tobacco Use   Smoking status: Every Day    Packs/day: .5    Types: Cigarettes     Last attempt to quit: 09/06/2014    Years since quitting: 8.5   Smokeless tobacco: Never  Substance and Sexual Activity   Alcohol use: No   Drug use: Not Currently    Types: Methamphetamines    Comment: no methamphetamine use in 30 days   Sexual activity: Not Currently    Birth control/protection: Abstinence  Other Topics Concern   Not on file  Social History Narrative   42 y/o Caucasian female. Married and homeless with polysubstance abuse. Per pt-Graduated from [employer] in 2006 with bachelors degree in [place].   Social Determinants of Health   Financial Resource Strain: Not on file  Food Insecurity: No Food Insecurity (03/13/2023)   Hunger Vital Sign    Worried About Running Out of Food in the Last Year: Never true    Ran Out of Food in the Last Year: Never true  Transportation Needs: No Transportation Needs (03/13/2023)   PRAPARE - Hydrologist (Medical): No    Lack of Transportation (Non-Medical): No  Physical Activity: Not on file  Stress: Not on file  Social Connections: Not on file   Additional Social History:                         Sleep: Fair  Appetite:  Fair  Current Medications: Current Facility-Administered Medications  Medication Dose Route Frequency Provider Last Rate Last Admin   acetaminophen (TYLENOL) tablet 650 mg  650 mg Oral Q6H PRN Caroline Sauger, NP       alum & mag hydroxide-simeth (MAALOX/MYLANTA) 200-200-20 MG/5ML suspension 30 mL  30 mL Oral Q4H PRN Caroline Sauger, NP       haloperidol (HALDOL) tablet 5 mg  5 mg Oral Q6H PRN Caroline Sauger, NP       And   benztropine (COGENTIN) tablet 1 mg  1 mg Oral Q6H PRN Caroline Sauger, NP       diphenhydrAMINE (BENADRYL) capsule 50 mg  50 mg Oral TID PRN Caroline Sauger, NP       Or   diphenhydrAMINE (BENADRYL) injection 50 mg  50 mg Intramuscular TID PRN Caroline Sauger, NP       escitalopram (LEXAPRO) tablet 10  mg  10 mg Oral Daily Kameko Hukill, Madie Reno, MD   10 mg at 03/15/23 [id number]   gabapentin (NEURONTIN) capsule 300 mg  300 mg Oral QHS Daelyn Mozer T, MD   300 mg at 03/14/23 2132   haloperidol (HALDOL) tablet 5 mg  5 mg Oral TID PRN Caroline Sauger, NP       Or   haloperidol lactate (HALDOL) injection 5 mg  5 mg Intramuscular TID PRN Caroline Sauger, NP       LORazepam (ATIVAN) tablet 2 mg  2 mg Oral TID PRN Caroline Sauger, NP       Or   LORazepam (ATIVAN) injection 2 mg  2 mg Intramuscular TID PRN Caroline Sauger, NP       risperiDONE (RISPERDAL M-TABS) disintegrating tablet 2 mg  2 mg Oral Q8H PRN Caroline Sauger, NP       And   LORazepam (ATIVAN) tablet 1 mg  1 mg Oral PRN Caroline Sauger, NP       And   ziprasidone (GEODON) injection 20 mg  20 mg Intramuscular PRN Caroline Sauger, NP       magnesium hydroxide (MILK OF MAGNESIA) suspension 30 mL  30 mL Oral Daily PRN Caroline Sauger, NP        Lab Results: No results found for this or any previous visit (from the past 48 hour(s)).  Blood Alcohol level:  Lab Results  Component Value Date   ETH <10 03/12/2023   ETH <10 123XX123    Metabolic Disorder Labs: Lab Results  Component Value Date   HGBA1C 5.3 08/06/2022   MPG 105.41 08/06/2022   MPG 108.28 07/06/2022   No results found for: "PROLACTIN" Lab Results  Component Value Date   CHOL 184 08/06/2022   TRIG 169 (H) 08/06/2022   HDL 53 08/06/2022   CHOLHDL 3.5 08/06/2022   VLDL 34 08/06/2022   LDLCALC 97 08/06/2022   LDLCALC 119 (H) 07/06/2022    Physical Findings: AIMS: Facial and Oral Movements Muscles of Facial Expression: None, normal Lips and Perioral Area: None, normal Jaw: None, normal Tongue: None, normal,Extremity Movements Upper (arms, wrists, hands, fingers): None, normal Lower (legs, knees, ankles, toes): None, normal, Trunk Movements Neck, shoulders, hips: None, normal, Overall Severity Severity of abnormal movements  (highest score from questions above): None, normal Incapacitation due to abnormal movements: None, normal Patient's awareness of abnormal movements (rate only patient's report): No Awareness, Dental Status Current problems with teeth and/or dentures?: No Does patient usually wear dentures?: No  CIWA:    COWS:     Musculoskeletal: Strength & Muscle Tone: within normal  limits Gait & Station: normal Patient leans: N/A  Psychiatric Specialty Exam:  Presentation  General Appearance:  Bizarre  Eye Contact: Good  Speech: Clear and Coherent  Speech Volume: Normal  Handedness: Right   Mood and Affect  Mood: Euthymic  Affect: Congruent   Thought Process  Thought Processes: Coherent  Descriptions of Associations:Intact  Orientation:Full (Time, Place and Person)  Thought Content:Logical  History of Schizophrenia/Schizoaffective disorder:No  Duration of Psychotic Symptoms:N/A  Hallucinations:No data recorded Ideas of Reference:None  Suicidal Thoughts:No data recorded Homicidal Thoughts:No data recorded  Sensorium  Memory: Immediate Good; Recent Good; Remote Good  Judgment: Fair  Insight: Fair   Community education officer  Concentration: Fair  Attention Span: Fair  Recall: [employer] of Knowledge: Fair  Language: Fair   Psychomotor Activity  Psychomotor Activity:No data recorded  Assets  Assets: Catering manager; Housing; Social Support; Resilience   Sleep  Sleep:No data recorded   Physical Exam: Physical Exam Vitals and nursing note reviewed.  Constitutional:      Appearance: Normal appearance.  HENT:     Head: Normocephalic and atraumatic.     Mouth/Throat:     Pharynx: Oropharynx is clear.  Eyes:     Pupils: Pupils are equal, round, and reactive to light.  Cardiovascular:     Rate and Rhythm: Normal rate and regular rhythm.  Pulmonary:     Effort: Pulmonary effort is normal.     Breath sounds: Normal breath  sounds.  Abdominal:     General: Abdomen is flat.     Palpations: Abdomen is soft.  Musculoskeletal:        General: Normal range of motion.  Skin:    General: Skin is warm and dry.  Neurological:     General: No focal deficit present.     Mental Status: She is alert. Mental status is at baseline.  Psychiatric:        Attention and Perception: Attention normal.        Mood and Affect: Mood normal. Affect is blunt.        Speech: Speech normal.        Behavior: Behavior is withdrawn.        Thought Content: Thought content normal.        Cognition and Memory: Cognition normal.    Review of Systems  Constitutional: Negative.   HENT: Negative.    Eyes: Negative.   Respiratory: Negative.    Cardiovascular: Negative.   Gastrointestinal: Negative.   Musculoskeletal: Negative.   Skin: Negative.   Neurological: Negative.   Psychiatric/Behavioral:  Positive for depression. Negative for hallucinations, substance abuse and suicidal ideas. The patient is not nervous/anxious.    Blood pressure 108/75, pulse 90, temperature 98.2 F (36.8 C), temperature source Oral, resp. rate 16, height 5\' 7"  (1.702 m), weight 61.7 kg, last menstrual period 02/19/2023, SpO2 97 %. Body mass index is 21.3 kg/m.   Treatment Plan Summary: Medication management and Plan no change to medication management plan.  Encourage patient to make sure she is eating and drinking well.  Vital signs appear to be stabilizing.  No new labs back.  Not on standing antipsychotics so I do not think we need to necessarily reorder the lipid panel or hemoglobin A1c.  Encourage patient to get up and let us know if she needs anything.  Alethia Berthold, MD 03/15/2023, 12:55 PM

## 2023-03-15 NOTE — Progress Notes (Signed)
Pt denies HI/AVH and SI but did state, "not yet" and verbally agrees to approach staff if these become apparent or before harming themselves/others. Rates depression 0/10. Rates anxiety 0/10. Rates pain 0/10.  Pt has been in her room all day, sleeping. Pt did not get up for lunch but did get up for dinner. Pt went right back to her room. Scheduled medications administered to pt, per MD orders. RN provided support and encouragement to pt. Q15 min safety checks implemented and continued. Pt is safe on the unit. Plan of care on going and no other concerns expressed at this time.  03/15/23 0835  Psych Admission Type (Psych Patients Only)  Admission Status Involuntary  Psychosocial Assessment  Patient Complaints None  Eye Contact Fair  Facial Expression Sad  Affect Depressed  Speech Soft  Interaction Isolative;Assertive  Motor Activity Slow  Appearance/Hygiene Unremarkable  Behavior Characteristics Cooperative;Appropriate to situation;Calm  Mood Depressed;Pleasant  Aggressive Behavior  Effect No apparent injury  Thought Process  Coherency WDL  Content WDL  Delusions None reported or observed  Perception WDL  Hallucination None reported or observed  Judgment Impaired  Confusion None  Danger to Self  Current suicidal ideation? Denies ("not now")  Agreement Not to Harm Self Yes  Description of Agreement verbal  Danger to Others  Danger to Others None reported or observed

## 2023-03-15 NOTE — Progress Notes (Signed)
   03/15/23 2300  Psych Admission Type (Psych Patients Only)  Admission Status Involuntary  Psychosocial Assessment  Patient Complaints None  Eye Contact Fair  Facial Expression Sad  Affect Depressed  Speech Soft  Interaction Isolative  Motor Activity Slow  Appearance/Hygiene Unremarkable  Behavior Characteristics Cooperative  Mood Depressed  Thought Process  Coherency WDL  Content WDL  Delusions None reported or observed  Perception WDL  Hallucination None reported or observed  Judgment Impaired  Confusion None  Danger to Self  Current suicidal ideation? Denies  Danger to Others  Danger to Others None reported or observed

## 2023-03-15 NOTE — Group Note (Signed)
AA/NA Group     Group Date: 03/15/2023 Start Time: [id number] End Time: 1445     Type of Therapy and Topic:  Group Therapy:    Participation Level:  Did Not Attend   Description of Group: AA/NA providers held group with patients to address SUD.   Summary of Patient Progress:     X   Therapeutic Modalities:    Maryjane Hurter 03/15/2023  2:53 PM

## 2023-03-15 NOTE — Progress Notes (Signed)
Pt was calm and cooperative during the shift.  Denies SI, HI AVH.  Compliant with medications.  Continued monitoring for safety.  03/15/23 0300  Psych Admission Type (Psych Patients Only)  Admission Status Involuntary  Psychosocial Assessment  Patient Complaints Anxiety  Eye Contact Fair  Facial Expression Anxious  Affect Depressed  Speech Soft  Interaction Assertive  Motor Activity Slow  Appearance/Hygiene In scrubs  Behavior Characteristics Cooperative  Mood Depressed  Thought Process  Coherency WDL  Content WDL  Delusions None reported or observed  Perception WDL  Hallucination None reported or observed  Judgment WDL  Confusion None  Danger to Self  Current suicidal ideation? Denies  Danger to Others  Danger to Others None reported or observed

## 2023-03-16 MED ORDER — TRAZODONE HCL 100 MG PO TABS
100.0000 mg | ORAL_TABLET | Freq: Every day | ORAL | Status: DC
  Administered 2023-03-16 – 2023-03-17 (×2): 100 mg via ORAL
  Filled 2023-03-16 (×2): qty 1

## 2023-03-16 NOTE — Progress Notes (Signed)
   03/16/23 2200  Psych Admission Type (Psych Patients Only)  Admission Status Involuntary  Psychosocial Assessment  Patient Complaints None  Eye Contact Fair  Facial Expression Sad  Affect Appropriate to circumstance  Speech Soft  Interaction Isolative  Motor Activity Slow  Appearance/Hygiene Unremarkable  Behavior Characteristics Cooperative  Mood Depressed  Thought Process  Coherency WDL  Content WDL  Delusions None reported or observed  Perception WDL  Hallucination None reported or observed  Judgment Impaired  Confusion None  Danger to Self  Current suicidal ideation? Denies  Danger to Others  Danger to Others None reported or observed

## 2023-03-16 NOTE — [facility] Group Notes (Signed)
[place] Group Notes:  (Nursing/MHT/Case Management/Adjunct)  Date:  03/16/2023  Time:  9:03 PM  Type of Therapy:   Wrap up  Participation Level:  Active  Participation Quality:  Appropriate  Affect:  Appropriate  Cognitive:  Alert  Insight:  Good  Engagement in Group:  Engaged and goal is that she glad to be back on her medicine.  Modes of Intervention:  Support  Summary of Progress/Problems:  Taylor Wiggins 03/16/2023, 9:03 PM

## 2023-03-16 NOTE — Progress Notes (Signed)
[facility] MD Progress Note  03/16/2023 1:03 PM Taylor Wiggins  MRN:  [id number] Subjective: Follow-up for this patient with depression and substance abuse.  Patient reports mood is a little better but still depressed still has passive suicidal thoughts.  Denies psychotic symptoms.  Did not sleep well last night. Principal Problem: MDD (major depressive disorder), recurrent episode, moderate (HCC) Diagnosis: Principal Problem:   MDD (major depressive disorder), recurrent episode, moderate (HCC)  Total Time spent with patient: 30 minutes  Past Psychiatric History: Past history of recurrent depression and substance use disorder  Past Medical History:  Past Medical History:  Diagnosis Date   Adult ADHD    Anxiety    Asthma    Hepatitis C    IBS (irritable bowel syndrome)    Opioid use disorder    No longer on buprenorphine therapy   PTSD (post-traumatic stress disorder)     Past Surgical History:  Procedure Laterality Date   BREAST REDUCTION SURGERY  2005   Family History:  Family History  Problem Relation Age of Onset   Anxiety disorder Mother    Diabetes Mother    Anxiety disorder Father    Family Psychiatric  History: See previous Social History:  Social History   Substance and Sexual Activity  Alcohol Use No     Social History   Substance and Sexual Activity  Drug Use Not Currently   Types: Methamphetamines   Comment: no methamphetamine use in 30 days    Social History   Socioeconomic History   Marital status: Legally Separated    Spouse name: Not on file   Number of children: Not on file   Years of education: Not on file   Highest education level: Not on file  Occupational History   Not on file  Tobacco Use   Smoking status: Every Day    Packs/day: .5    Types: Cigarettes    Last attempt to quit: 09/06/2014    Years since quitting: 8.5   Smokeless tobacco: Never  Substance and Sexual Activity   Alcohol use: No   Drug use: Not Currently    Types:  Methamphetamines    Comment: no methamphetamine use in 30 days   Sexual activity: Not Currently    Birth control/protection: Abstinence  Other Topics Concern   Not on file  Social History Narrative   42 y/o Caucasian female. Married and homeless with polysubstance abuse. Per pt-Graduated from [employer] in 2006 with bachelors degree in [place].   Social Determinants of Health   Financial Resource Strain: Not on file  Food Insecurity: No Food Insecurity (03/13/2023)   Hunger Vital Sign    Worried About Running Out of Food in the Last Year: Never true    Ran Out of Food in the Last Year: Never true  Transportation Needs: No Transportation Needs (03/13/2023)   PRAPARE - Hydrologist (Medical): No    Lack of Transportation (Non-Medical): No  Physical Activity: Not on file  Stress: Not on file  Social Connections: Not on file   Additional Social History:                         Sleep: Fair  Appetite:  Fair  Current Medications: Current Facility-Administered Medications  Medication Dose Route Frequency Provider Last Rate Last Admin   acetaminophen (TYLENOL) tablet 650 mg  650 mg Oral Q6H PRN Caroline Sauger, NP  alum & mag hydroxide-simeth (MAALOX/MYLANTA) 200-200-20 MG/5ML suspension 30 mL  30 mL Oral Q4H PRN Caroline Sauger, NP       haloperidol (HALDOL) tablet 5 mg  5 mg Oral Q6H PRN Caroline Sauger, NP       And   benztropine (COGENTIN) tablet 1 mg  1 mg Oral Q6H PRN Caroline Sauger, NP       diphenhydrAMINE (BENADRYL) capsule 50 mg  50 mg Oral TID PRN Caroline Sauger, NP       Or   diphenhydrAMINE (BENADRYL) injection 50 mg  50 mg Intramuscular TID PRN Caroline Sauger, NP       escitalopram (LEXAPRO) tablet 10 mg  10 mg Oral Daily Nur Krasinski, Madie Reno, MD   10 mg at 03/16/23 [id number]   gabapentin (NEURONTIN) capsule 300 mg  300 mg Oral QHS Damarco Keysor T, MD   300 mg at 03/14/23 2132    haloperidol (HALDOL) tablet 5 mg  5 mg Oral TID PRN Caroline Sauger, NP       Or   haloperidol lactate (HALDOL) injection 5 mg  5 mg Intramuscular TID PRN Caroline Sauger, NP       LORazepam (ATIVAN) tablet 2 mg  2 mg Oral TID PRN Caroline Sauger, NP       Or   LORazepam (ATIVAN) injection 2 mg  2 mg Intramuscular TID PRN Caroline Sauger, NP       risperiDONE (RISPERDAL M-TABS) disintegrating tablet 2 mg  2 mg Oral Q8H PRN Caroline Sauger, NP       And   LORazepam (ATIVAN) tablet 1 mg  1 mg Oral PRN Caroline Sauger, NP       And   ziprasidone (GEODON) injection 20 mg  20 mg Intramuscular PRN Caroline Sauger, NP       magnesium hydroxide (MILK OF MAGNESIA) suspension 30 mL  30 mL Oral Daily PRN Caroline Sauger, NP       traZODone (DESYREL) tablet 100 mg  100 mg Oral QHS Thereasa Iannello, Madie Reno, MD        Lab Results: No results found for this or any previous visit (from the past 48 hour(s)).  Blood Alcohol level:  Lab Results  Component Value Date   ETH <10 03/12/2023   ETH <10 123XX123    Metabolic Disorder Labs: Lab Results  Component Value Date   HGBA1C 5.3 08/06/2022   MPG 105.41 08/06/2022   MPG 108.28 07/06/2022   No results found for: "PROLACTIN" Lab Results  Component Value Date   CHOL 184 08/06/2022   TRIG 169 (H) 08/06/2022   HDL 53 08/06/2022   CHOLHDL 3.5 08/06/2022   VLDL 34 08/06/2022   LDLCALC 97 08/06/2022   LDLCALC 119 (H) 07/06/2022    Physical Findings: AIMS: Facial and Oral Movements Muscles of Facial Expression: None, normal Lips and Perioral Area: None, normal Jaw: None, normal Tongue: None, normal,Extremity Movements Upper (arms, wrists, hands, fingers): None, normal Lower (legs, knees, ankles, toes): None, normal, Trunk Movements Neck, shoulders, hips: None, normal, Overall Severity Severity of abnormal movements (highest score from questions above): None, normal Incapacitation due to abnormal movements:  None, normal Patient's awareness of abnormal movements (rate only patient's report): No Awareness, Dental Status Current problems with teeth and/or dentures?: No Does patient usually wear dentures?: No  CIWA:    COWS:     Musculoskeletal: Strength & Muscle Tone: within normal limits Gait & Station: normal Patient leans: N/A  Psychiatric Specialty Exam:  Presentation  General Appearance:  Bizarre  Eye Contact: Good  Speech: Clear and Coherent  Speech Volume: Normal  Handedness: Right   Mood and Affect  Mood: Euthymic  Affect: Congruent   Thought Process  Thought Processes: Coherent  Descriptions of Associations:Intact  Orientation:Full (Time, Place and Person)  Thought Content:Logical  History of Schizophrenia/Schizoaffective disorder:No  Duration of Psychotic Symptoms:N/A  Hallucinations:No data recorded Ideas of Reference:None  Suicidal Thoughts:No data recorded Homicidal Thoughts:No data recorded  Sensorium  Memory: Immediate Good; Recent Good; Remote Good  Judgment: Fair  Insight: Fair   Materials engineer: Fair  Attention Span: Fair  Recall: [employer] of Knowledge: Fair  Language: Fair   Psychomotor Activity  Psychomotor Activity:No data recorded  Assets  Assets: Catering manager; Housing; Social Support; Resilience   Sleep  Sleep:No data recorded   Physical Exam: Physical Exam Vitals and nursing note reviewed.  Constitutional:      Appearance: Normal appearance.  HENT:     Head: Normocephalic and atraumatic.     Mouth/Throat:     Pharynx: Oropharynx is clear.  Eyes:     Pupils: Pupils are equal, round, and reactive to light.  Cardiovascular:     Rate and Rhythm: Normal rate and regular rhythm.  Pulmonary:     Effort: Pulmonary effort is normal.     Breath sounds: Normal breath sounds.  Abdominal:     General: Abdomen is flat.     Palpations: Abdomen is soft.   Musculoskeletal:        General: Normal range of motion.  Skin:    General: Skin is warm and dry.  Neurological:     General: No focal deficit present.     Mental Status: She is alert. Mental status is at baseline.  Psychiatric:        Attention and Perception: Attention normal.        Mood and Affect: Mood normal. Affect is blunt.        Speech: Speech is delayed.        Thought Content: Thought content includes suicidal ideation.        Cognition and Memory: Memory is impaired.    Review of Systems  Constitutional: Negative.   HENT: Negative.    Eyes: Negative.   Respiratory: Negative.    Cardiovascular: Negative.   Gastrointestinal: Negative.   Musculoskeletal: Negative.   Skin: Negative.   Neurological: Negative.   Psychiatric/Behavioral:  Positive for depression and suicidal ideas. The patient has insomnia.    Blood pressure 98/78, pulse (!) 102, temperature 98.6 F (37 C), temperature source Oral, resp. rate 18, height 5\' 7"  (1.702 m), weight 61.7 kg, last menstrual period 02/19/2023, SpO2 99 %. Body mass index is 21.3 kg/m.   Treatment Plan Summary: Plan add trazodone for sleep at night no other change to medication.  Reviewed chart.  Encourage group attendance.  Alethia Berthold, MD 03/16/2023, 1:03 PM

## 2023-03-16 NOTE — Plan of Care (Signed)
Patient stated that she feels better but verbalized passive suicidal thoughts at times. Patient contracts for safety. Denies HI and AVH. Patient stays in bed most of he shift. Stated that she she could not sleep well last night. Appetite good. Compliant with medications. Support and encouragement given.

## 2023-03-17 ENCOUNTER — Other Ambulatory Visit: Payer: Self-pay

## 2023-03-17 MED ORDER — TRAZODONE HCL 100 MG PO TABS
100.0000 mg | ORAL_TABLET | Freq: Every day | ORAL | 0 refills | Status: DC
  Filled 2023-03-17: qty 14, 14d supply, fill #0

## 2023-03-17 MED ORDER — GABAPENTIN 300 MG PO CAPS: 300.0000 mg | ORAL_CAPSULE | Freq: Every day | ORAL | 1 refills | Status: DC

## 2023-03-17 MED ORDER — ESCITALOPRAM OXALATE 10 MG PO TABS: 10.0000 mg | ORAL_TABLET | Freq: Every day | ORAL | 1 refills | Status: DC

## 2023-03-17 MED ORDER — ESCITALOPRAM OXALATE 10 MG PO TABS
10.0000 mg | ORAL_TABLET | Freq: Every day | ORAL | 0 refills | Status: DC
  Filled 2023-03-17: qty 14, 14d supply, fill #0

## 2023-03-17 MED ORDER — GABAPENTIN 300 MG PO CAPS
300.0000 mg | ORAL_CAPSULE | Freq: Every day | ORAL | 0 refills | Status: DC
  Filled 2023-03-17: qty 14, 14d supply, fill #0

## 2023-03-17 MED ORDER — TRAZODONE HCL 100 MG PO TABS: 100.0000 mg | ORAL_TABLET | Freq: Every day | ORAL | 1 refills | Status: DC

## 2023-03-17 NOTE — Plan of Care (Signed)
Patient stated that she slept good last night and her anxiety is much better. Patient stayed in bed most of the shift. Patient denies SI,HI and AVH. Appetite and energy level good. ADLs maintained. Support and encouragement given.

## 2023-03-17 NOTE — Group Note (Signed)
Recreation Therapy Group Note   Group Topic:Goal Setting  Group Date: 03/17/2023 Start Time: 1000 End Time: 1045 Facilitators: Vilma Prader, LRT, CTRS Location:  Craft Room  Group Description: Scientist, physiological. Patients were given many different magazines, a glue stick, markers, and a piece of cardstock paper. LRT and pts discussed the importance of having goals in life. LRT and pts discussed the difference between short-term and long-term goals. LRT encouraged pts to create a vision board, with images they picked and then cut out by LRT from the magazine, for themselves, that capture their short and long-term goals. On the back of the paper, pt encouraged to write 3 different coping skills that can help them reach those goals. LRT encouraged pts to show and explain their vision board to the group once complete. LRT offered to laminate vision board once dry and complete.   Affect/Mood: N/A   Participation Level: Did not attend    Clinical Observations/Individualized Feedback: Taylor Wiggins did not attend group due to resting in her room.  Plan: Continue to engage patient in RT group sessions 2-3x/week.   Vilma Prader, LRT, CTRS 03/17/2023 10:56 AM

## 2023-03-17 NOTE — Group Note (Signed)
[facility] LCSW Group Therapy Note    Group Date: 03/17/2023 Start Time: 1300 End Time: 1400  Type of Therapy and Topic:  Group Therapy:  Overcoming Obstacles  Participation Level:  [facility] PARTICIPATION LEVEL: Did Not Attend  Mood:  Description of Group:   In this group patients will be encouraged to explore what they see as obstacles to their own wellness and recovery. They will be guided to discuss their thoughts, feelings, and behaviors related to these obstacles. The group will process together ways to cope with barriers, with attention given to specific choices patients can make. Each patient will be challenged to identify changes they are motivated to make in order to overcome their obstacles. This group will be process-oriented, with patients participating in exploration of their own experiences as well as giving and receiving support and challenge from other group members.  Therapeutic Goals: 1. Patient will identify personal and current obstacles as they relate to admission. 2. Patient will identify barriers that currently interfere with their wellness or overcoming obstacles.  3. Patient will identify feelings, thought process and behaviors related to these barriers. 4. Patient will identify two changes they are willing to make to overcome these obstacles:    Summary of Patient Progress Patient did not attend group despite encouraged participation.     Therapeutic Modalities:   Cognitive Behavioral Therapy Solution Focused Therapy Motivational Interviewing Relapse Prevention Therapy   Durenda Hurt, [place]

## 2023-03-17 NOTE — Progress Notes (Signed)
[facility] MD Progress Note  03/17/2023 5:25 PM Taylor Wiggins  MRN:  [id number] Subjective: Patient seen for follow-up.  42 year old woman with depression and substance use problems.  Awake today and states she is feeling much better.  Patient denies any suicidal ideation or psychotic symptoms.  No behavior problems.  Tolerating medication fine. Principal Problem: MDD (major depressive disorder), recurrent episode, moderate (HCC) Diagnosis: Principal Problem:   MDD (major depressive disorder), recurrent episode, moderate (HCC)  Total Time spent with patient: 30 minutes  Past Psychiatric History: Past history of recurrent substance use problems related to mood symptoms  Past Medical History:  Past Medical History:  Diagnosis Date   Adult ADHD    Anxiety    Asthma    Hepatitis C    IBS (irritable bowel syndrome)    Opioid use disorder    No longer on buprenorphine therapy   PTSD (post-traumatic stress disorder)     Past Surgical History:  Procedure Laterality Date   BREAST REDUCTION SURGERY  2005   Family History:  Family History  Problem Relation Age of Onset   Anxiety disorder Mother    Diabetes Mother    Anxiety disorder Father    Family Psychiatric  History: See previous Social History:  Social History   Substance and Sexual Activity  Alcohol Use No     Social History   Substance and Sexual Activity  Drug Use Not Currently   Types: Methamphetamines   Comment: no methamphetamine use in 30 days    Social History   Socioeconomic History   Marital status: Legally Separated    Spouse name: Not on file   Number of children: Not on file   Years of education: Not on file   Highest education level: Not on file  Occupational History   Not on file  Tobacco Use   Smoking status: Every Day    Packs/day: .5    Types: Cigarettes    Last attempt to quit: 09/06/2014    Years since quitting: 8.5   Smokeless tobacco: Never  Substance and Sexual Activity   Alcohol use: No    Drug use: Not Currently    Types: Methamphetamines    Comment: no methamphetamine use in 30 days   Sexual activity: Not Currently    Birth control/protection: Abstinence  Other Topics Concern   Not on file  Social History Narrative   42 y/o Caucasian female. Married and homeless with polysubstance abuse. Per pt-Graduated from [employer] in 2006 with bachelors degree in [place].   Social Determinants of Health   Financial Resource Strain: Not on file  Food Insecurity: No Food Insecurity (03/13/2023)   Hunger Vital Sign    Worried About Running Out of Food in the Last Year: Never true    Ran Out of Food in the Last Year: Never true  Transportation Needs: No Transportation Needs (03/13/2023)   PRAPARE - Hydrologist (Medical): No    Lack of Transportation (Non-Medical): No  Physical Activity: Not on file  Stress: Not on file  Social Connections: Not on file   Additional Social History:                         Sleep: Fair  Appetite:  Fair  Current Medications: Current Facility-Administered Medications  Medication Dose Route Frequency Provider Last Rate Last Admin   acetaminophen (TYLENOL) tablet 650 mg  650 mg Oral Q6H PRN Caroline Sauger, NP  alum & mag hydroxide-simeth (MAALOX/MYLANTA) 200-200-20 MG/5ML suspension 30 mL  30 mL Oral Q4H PRN Caroline Sauger, NP       haloperidol (HALDOL) tablet 5 mg  5 mg Oral Q6H PRN Caroline Sauger, NP       And   benztropine (COGENTIN) tablet 1 mg  1 mg Oral Q6H PRN Caroline Sauger, NP       diphenhydrAMINE (BENADRYL) capsule 50 mg  50 mg Oral TID PRN Caroline Sauger, NP       Or   diphenhydrAMINE (BENADRYL) injection 50 mg  50 mg Intramuscular TID PRN Caroline Sauger, NP       escitalopram (LEXAPRO) tablet 10 mg  10 mg Oral Daily Khadijatou Borak, Madie Reno, MD   10 mg at 03/17/23 0802   gabapentin (NEURONTIN) capsule 300 mg  300 mg Oral QHS Everest Brod T,  MD   300 mg at 03/16/23 2131   haloperidol (HALDOL) tablet 5 mg  5 mg Oral TID PRN Caroline Sauger, NP       Or   haloperidol lactate (HALDOL) injection 5 mg  5 mg Intramuscular TID PRN Caroline Sauger, NP       LORazepam (ATIVAN) tablet 2 mg  2 mg Oral TID PRN Caroline Sauger, NP       Or   LORazepam (ATIVAN) injection 2 mg  2 mg Intramuscular TID PRN Caroline Sauger, NP       risperiDONE (RISPERDAL M-TABS) disintegrating tablet 2 mg  2 mg Oral Q8H PRN Caroline Sauger, NP       And   LORazepam (ATIVAN) tablet 1 mg  1 mg Oral PRN Caroline Sauger, NP       And   ziprasidone (GEODON) injection 20 mg  20 mg Intramuscular PRN Caroline Sauger, NP       magnesium hydroxide (MILK OF MAGNESIA) suspension 30 mL  30 mL Oral Daily PRN Caroline Sauger, NP       traZODone (DESYREL) tablet 100 mg  100 mg Oral QHS Larken Urias, Madie Reno, MD   100 mg at 03/16/23 2131    Lab Results: No results found for this or any previous visit (from the past 48 hour(s)).  Blood Alcohol level:  Lab Results  Component Value Date   ETH <10 03/12/2023   ETH <10 123XX123    Metabolic Disorder Labs: Lab Results  Component Value Date   HGBA1C 5.3 08/06/2022   MPG 105.41 08/06/2022   MPG 108.28 07/06/2022   No results found for: "PROLACTIN" Lab Results  Component Value Date   CHOL 184 08/06/2022   TRIG 169 (H) 08/06/2022   HDL 53 08/06/2022   CHOLHDL 3.5 08/06/2022   VLDL 34 08/06/2022   LDLCALC 97 08/06/2022   LDLCALC 119 (H) 07/06/2022    Physical Findings: AIMS: Facial and Oral Movements Muscles of Facial Expression: None, normal Lips and Perioral Area: None, normal Jaw: None, normal Tongue: None, normal,Extremity Movements Upper (arms, wrists, hands, fingers): None, normal Lower (legs, knees, ankles, toes): None, normal, Trunk Movements Neck, shoulders, hips: None, normal, Overall Severity Severity of abnormal movements (highest score from questions above): None,  normal Incapacitation due to abnormal movements: None, normal Patient's awareness of abnormal movements (rate only patient's report): No Awareness, Dental Status Current problems with teeth and/or dentures?: No Does patient usually wear dentures?: No  CIWA:    COWS:     Musculoskeletal: Strength & Muscle Tone: within normal limits Gait & Station: normal Patient leans: N/A  Psychiatric Specialty Exam:  Presentation  General Appearance:  Bizarre  Eye Contact: Good  Speech: Clear and Coherent  Speech Volume: Normal  Handedness: Right   Mood and Affect  Mood: Euthymic  Affect: Congruent   Thought Process  Thought Processes: Coherent  Descriptions of Associations:Intact  Orientation:Full (Time, Place and Person)  Thought Content:Logical  History of Schizophrenia/Schizoaffective disorder:No  Duration of Psychotic Symptoms:N/A  Hallucinations:No data recorded Ideas of Reference:None  Suicidal Thoughts:No data recorded Homicidal Thoughts:No data recorded  Sensorium  Memory: Immediate Good; Recent Good; Remote Good  Judgment: Fair  Insight: Fair   Materials engineer: Fair  Attention Span: Fair  Recall: [employer] of Knowledge: Fair  Language: Fair   Psychomotor Activity  Psychomotor Activity:No data recorded  Assets  Assets: Catering manager; Housing; Social Support; Resilience   Sleep  Sleep:No data recorded   Physical Exam: Physical Exam Vitals and nursing note reviewed.  Constitutional:      Appearance: Normal appearance.  HENT:     Head: Normocephalic and atraumatic.     Mouth/Throat:     Pharynx: Oropharynx is clear.  Eyes:     Pupils: Pupils are equal, round, and reactive to light.  Cardiovascular:     Rate and Rhythm: Normal rate and regular rhythm.  Pulmonary:     Effort: Pulmonary effort is normal.     Breath sounds: Normal breath sounds.  Abdominal:     General: Abdomen  is flat.     Palpations: Abdomen is soft.  Musculoskeletal:        General: Normal range of motion.  Skin:    General: Skin is warm and dry.  Neurological:     General: No focal deficit present.     Mental Status: She is alert. Mental status is at baseline.  Psychiatric:        Mood and Affect: Mood normal.        Thought Content: Thought content normal.    Review of Systems  Constitutional: Negative.   HENT: Negative.    Eyes: Negative.   Respiratory: Negative.    Cardiovascular: Negative.   Gastrointestinal: Negative.   Musculoskeletal: Negative.   Skin: Negative.   Neurological: Negative.   Psychiatric/Behavioral: Negative.     Blood pressure 98/78, pulse (!) 102, temperature 98.6 F (37 C), temperature source Oral, resp. rate 18, height 5\' 7"  (1.702 m), weight 61.7 kg, last menstrual period 02/19/2023, SpO2 99 %. Body mass index is 21.3 kg/m.   Treatment Plan Summary: Medication management and Plan patient seems to be doing better.  No current indication of acute dangerousness.  Patient was counseled again about the importance of staying off of drugs and staying active in treatment continuing medication.  He would like to be discharged tomorrow as she has a court date coming up.  We will go ahead and start making preparations for discharge planning by tomorrow.  Alethia Berthold, MD 03/17/2023, 5:25 PM

## 2023-03-17 NOTE — [facility] Group Notes (Signed)
[facility] Group Notes:  (Nursing/MHT/Case Management/Adjunct)  Date:  03/17/2023  Time:  8:49 PM  Type of Therapy:  Group Therapy  Participation Level:  Active  Participation Quality:  Appropriate  Affect:  Appropriate  Cognitive:  Alert and Appropriate  Insight:  Appropriate  Engagement in Group:  Improving and Lacking  Modes of Intervention:  Discussion and Education  Summary of Progress/Problems:  Maglione,Josef Tourigny E 03/17/2023, 8:49 PM

## 2023-03-17 NOTE — [facility] Group Notes (Signed)
[place] Group Notes:  (Nursing/MHT/Case Management/Adjunct)  Date:  03/17/2023  Time:  9:51 AM  Type of Therapy:   Community Meeting  Participation Level:  Did Not Attend   Adela Lank [facility] 03/17/2023, 9:51 AM

## 2023-03-18 ENCOUNTER — Other Ambulatory Visit: Payer: Self-pay

## 2023-03-18 NOTE — Plan of Care (Signed)
  Problem: Education: Goal: Knowledge of General Education information will improve Description: Including pain rating scale, medication(s)/side effects and non-pharmacologic comfort measures Outcome: Adequate for Discharge   Problem: Health Behavior/Discharge Planning: Goal: Ability to manage health-related needs will improve Outcome: Adequate for Discharge   Problem: Clinical Measurements: Goal: Ability to maintain clinical measurements within normal limits will improve Outcome: Adequate for Discharge Goal: Will remain free from infection Outcome: Adequate for Discharge Goal: Diagnostic test results will improve Outcome: Adequate for Discharge Goal: Respiratory complications will improve Outcome: Adequate for Discharge Goal: Cardiovascular complication will be avoided Outcome: Adequate for Discharge   Problem: Activity: Goal: Risk for activity intolerance will decrease Outcome: Adequate for Discharge   Problem: Nutrition: Goal: Adequate nutrition will be maintained Outcome: Adequate for Discharge   Problem: Coping: Goal: Level of anxiety will decrease Outcome: Adequate for Discharge   Problem: Elimination: Goal: Will not experience complications related to bowel motility Outcome: Adequate for Discharge Goal: Will not experience complications related to urinary retention Outcome: Adequate for Discharge   Problem: Pain Managment: Goal: General experience of comfort will improve Outcome: Adequate for Discharge   Problem: Safety: Goal: Ability to remain free from injury will improve Outcome: Adequate for Discharge   Problem: Skin Integrity: Goal: Risk for impaired skin integrity will decrease Outcome: Adequate for Discharge   Problem: Education: Goal: Knowledge of [facility] Education information/materials will improve Outcome: Adequate for Discharge Goal: Emotional status will improve Outcome: Adequate for Discharge Goal: Mental status will  improve Outcome: Adequate for Discharge Goal: Verbalization of understanding the information provided will improve Outcome: Adequate for Discharge   Problem: Activity: Goal: Interest or engagement in activities will improve Outcome: Adequate for Discharge Goal: Sleeping patterns will improve Outcome: Adequate for Discharge   Problem: Coping: Goal: Ability to verbalize frustrations and anger appropriately will improve Outcome: Adequate for Discharge Goal: Ability to demonstrate self-control will improve Outcome: Adequate for Discharge   Problem: Health Behavior/Discharge Planning: Goal: Identification of resources available to assist in meeting health care needs will improve Outcome: Adequate for Discharge Goal: Compliance with treatment plan for underlying cause of condition will improve Outcome: Adequate for Discharge   Problem: Physical Regulation: Goal: Ability to maintain clinical measurements within normal limits will improve Outcome: Adequate for Discharge   Problem: Safety: Goal: Periods of time without injury will increase Outcome: Adequate for Discharge   

## 2023-03-18 NOTE — Plan of Care (Signed)
  Problem: Coping: Goal: Level of anxiety will decrease 03/18/2023 0231 by Jacinto Halim, RN Outcome: Progressing 03/18/2023 0228 by Jacinto Halim, RN Outcome: Progressing   Problem: Pain Managment: Goal: General experience of comfort will improve 03/18/2023 0231 by Jacinto Halim, RN Outcome: Progressing 03/18/2023 0228 by Jacinto Halim, RN Outcome: Progressing   Problem: Safety: Goal: Ability to remain free from injury will improve 03/18/2023 0231 by Jacinto Halim, RN Outcome: Progressing 03/18/2023 0228 by Jacinto Halim, RN Outcome: Progressing   Problem: Education: Goal: Emotional status will improve Outcome: Progressing Goal: Mental status will improve Outcome: Progressing   Problem: Coping: Goal: Ability to verbalize frustrations and anger appropriately will improve Outcome: Progressing   Problem: Safety: Goal: Periods of time without injury will increase Outcome: Progressing

## 2023-03-18 NOTE — Plan of Care (Signed)
  Problem: Coping: Goal: Level of anxiety will decrease Outcome: Progressing   Problem: Pain Managment: Goal: General experience of comfort will improve Outcome: Progressing   Problem: Safety: Goal: Ability to remain free from injury will improve Outcome: Progressing   

## 2023-03-18 NOTE — Group Note (Signed)
Recreation Therapy Group Note   Group Topic:Healthy Support Systems  Group Date: 03/18/2023 Start Time: 1000 End Time: 1055 Facilitators: Vilma Prader, LRT, CTRS Location:  Craft Room  Group Description: Straw Bridge. Patients were given 10 plastic drinking straws and an equal length of masking tape. Using the materials provided, patients were instructed to build a free-standing bridge-like structure to suspend an everyday item (ex: deck of cards) off the floor or table surface. All materials were required to be used in Conservation officer, nature. LRT facilitated post-activity discussion reviewing the importance of having strong and healthy support systems in our lives. LRT discussed how the people in our lives serve as the tape and the book we placed on top of our straw structure are the stressors we face in daily life. LRT and pts discussed what happens in our life when things get too heavy for [place], and we don't have strong supports outside of the hospital. Pt shared 2 of their healthy supports aloud in the group.   Affect/Mood: N/A   Participation Level: Did not attend    Clinical Observations/Individualized Feedback: Tanica did not attend group due to resting in her room.  Plan: Continue to engage patient in RT group sessions 2-3x/week.   Vilma Prader, LRT, CTRS 03/18/2023 11:16 AM

## 2023-03-18 NOTE — Progress Notes (Signed)
  [facility] Adult Case Management Discharge Plan :  Will you be returning to the same living situation after discharge:  Yes,  Patient to discharge to place of residence w/ mother  At discharge, do you have transportation home?: Yes,  Patient's mother to provide transportation  Do you have the ability to pay for your medications: Yes,  Medicaid   Release of information consent forms completed and in the chart;  Patient's signature needed at discharge.  Patient to Follow up at:  Follow-up Information     [employer] on 03/21/2023.   Why: Please present for scheduled appointment on March 22nd at [place] information: [address] [place] [address] [phone]                 Next level of care provider has access to [place] and Suicide Prevention discussed: Yes,  SPE completed with patient by nursing  staff, patient declined consent for CSW to reach family/friend.     Has patient been referred to the Quitline?: Patient refused referral Tobacco Use: High Risk (03/13/2023)   Patient History    Smoking Tobacco Use: Every Day    Smokeless Tobacco Use: Never    Passive Exposure: Not on file   Patient has been referred for addiction treatment: Pt. refused referral Social History   Substance and Sexual Activity  Alcohol Use No   Social History   Substance and Sexual Activity  Drug Use Not Currently   Types: Methamphetamines   Comment: no methamphetamine use in 30 days    Durenda Hurt, [place] 03/18/2023, 9:59 AM

## 2023-03-18 NOTE — Discharge Summary (Signed)
Physician Discharge Summary Note  Patient:  Taylor Wiggins is an 42 y.o., female MRN:  [id number] DOB:  August 13, 1981 Patient phone:  [phone] (home)  Patient address:   [place] [address],  Total Time spent with patient: 30 minutes  Date of Admission:  03/13/2023 Date of Discharge: 03/18/2023  Reason for Admission: Patient was admitted after presenting with worsening mood and anxiety and reports of having suicidal thoughts  Principal Problem: MDD (major depressive disorder), recurrent episode, moderate ([place]) Discharge Diagnoses: Principal Problem:   MDD (major depressive disorder), recurrent episode, moderate ([place])   Past Psychiatric History: History of chronic mood symptoms and anxiety possible PTSD also on top of chronic substance abuse issues.  Multiple prior hospitalizations.  Past Medical History:  Past Medical History:  Diagnosis Date   Adult ADHD    Anxiety    Asthma    Hepatitis C    IBS (irritable bowel syndrome)    Opioid use disorder    No longer on buprenorphine therapy   PTSD (post-traumatic stress disorder)     Past Surgical History:  Procedure Laterality Date   BREAST REDUCTION SURGERY  2005   Family History:  Family History  Problem Relation Age of Onset   Anxiety disorder Mother    Diabetes Mother    Anxiety disorder Father    Family Psychiatric  History: See previous.  Anxiety and depression in the family. Social History:  Social History   Substance and Sexual Activity  Alcohol Use No     Social History   Substance and Sexual Activity  Drug Use Not Currently   Types: Methamphetamines   Comment: no methamphetamine use in 30 days    Social History   Socioeconomic History   Marital status: Legally Separated    Spouse name: Not on file   Number of children: Not on file   Years of education: Not on file   Highest education level: Not on file  Occupational History   Not on file  Tobacco Use   Smoking  status: Every Day    Packs/day: .5    Types: Cigarettes    Last attempt to quit: 09/06/2014    Years since quitting: 8.5   Smokeless tobacco: Never  Substance and Sexual Activity   Alcohol use: No   Drug use: Not Currently    Types: Methamphetamines    Comment: no methamphetamine use in 30 days   Sexual activity: Not Currently    Birth control/protection: Abstinence  Other Topics Concern   Not on file  Social History Narrative   42 y/o Caucasian female. Married and homeless with polysubstance abuse. Per pt-Graduated from [employer] in 2006 with bachelors degree in [place].   Social Determinants of Health   Financial Resource Strain: Not on file  Food Insecurity: No Food Insecurity (03/13/2023)   Hunger Vital Sign    Worried About Running Out of Food in the Last Year: Never true    Ran Out of Food in the Last Year: Never true  Transportation Needs: No Transportation Needs (03/13/2023)   PRAPARE - Hydrologist (Medical): No    Lack of Transportation (Non-Medical): No  Physical Activity: Not on file  Stress: Not on file  Social Connections: Not on file    Hospital Course: Patient was kept on 15-minute checks.  Restarted on medication.  She mostly stayed isolated in her room for the time in the hospital rarely engage but was cooperative with  conversation when approached.  For a few days continued to say she was having suicidal thoughts but did not act on them.  By the time of discharge reports that her mood and anxiety are much improved and denies any suicidal ideation at all.  No evidence of psychosis.  Education has been provided about the importance of continuing to stay away from drug abuse and to stay on her medication and follow-up with outpatient mental health treatment.  Physical Findings: AIMS: Facial and Oral Movements Muscles of Facial Expression: None, normal Lips and Perioral Area: None, normal Jaw: None, normal Tongue:  None, normal,Extremity Movements Upper (arms, wrists, hands, fingers): None, normal Lower (legs, knees, ankles, toes): None, normal, Trunk Movements Neck, shoulders, hips: None, normal, Overall Severity Severity of abnormal movements (highest score from questions above): None, normal Incapacitation due to abnormal movements: None, normal Patient's awareness of abnormal movements (rate only patient's report): No Awareness, Dental Status Current problems with teeth and/or dentures?: No Does patient usually wear dentures?: No  CIWA:    COWS:     Musculoskeletal: Strength & Muscle Tone: within normal limits Gait & Station: normal Patient leans: N/A   Psychiatric Specialty Exam:  Presentation  General Appearance:  Bizarre  Eye Contact: Good  Speech: Clear and Coherent  Speech Volume: Normal  Handedness: Right   Mood and Affect  Mood: Euthymic  Affect: Congruent   Thought Process  Thought Processes: Coherent  Descriptions of Associations:Intact  Orientation:Full (Time, Place and Person)  Thought Content:Logical  History of Schizophrenia/Schizoaffective disorder:No  Duration of Psychotic Symptoms:N/A  Hallucinations:No data recorded Ideas of Reference:None  Suicidal Thoughts:No data recorded Homicidal Thoughts:No data recorded  Sensorium  Memory: Immediate Good; Recent Good; Remote Good  Judgment: Fair  Insight: Fair   Community education officer  Concentration: Fair  Attention Span: Fair  Recall: [employer] of Knowledge: Fair  Language: Fair   Psychomotor Activity  Psychomotor Activity:No data recorded  Assets  Assets: Financial Resources/Insurance; Housing; Social Support; Resilience   Sleep  Sleep:No data recorded   Physical Exam: Physical Exam Vitals and nursing note reviewed.  Constitutional:      Appearance: Normal appearance.  HENT:     Head: Normocephalic and atraumatic.     Mouth/Throat:     Pharynx: Oropharynx  is clear.  Eyes:     Pupils: Pupils are equal, round, and reactive to light.  Cardiovascular:     Rate and Rhythm: Normal rate and regular rhythm.  Pulmonary:     Effort: Pulmonary effort is normal.     Breath sounds: Normal breath sounds.  Abdominal:     General: Abdomen is flat.     Palpations: Abdomen is soft.  Musculoskeletal:        General: Normal range of motion.  Skin:    General: Skin is warm and dry.  Neurological:     General: No focal deficit present.     Mental Status: She is alert. Mental status is at baseline.  Psychiatric:        Attention and Perception: Attention normal.        Mood and Affect: Mood normal.        Speech: Speech normal.        Behavior: Behavior normal.        Thought Content: Thought content normal.        Cognition and Memory: Cognition normal.    Review of Systems  Constitutional: Negative.   HENT: Negative.    Eyes: Negative.  Respiratory: Negative.    Cardiovascular: Negative.   Gastrointestinal: Negative.   Musculoskeletal: Negative.   Skin: Negative.   Neurological: Negative.   Psychiatric/Behavioral: Negative.     Blood pressure 98/66, pulse 75, temperature 98.1 F (36.7 C), temperature source Oral, resp. rate 19, height 5\' 7"  (1.702 m), weight 61.7 kg, last menstrual period 02/19/2023, SpO2 98 %. Body mass index is 21.3 kg/m.   Social History   Tobacco Use  Smoking Status Every Day   Packs/day: .5   Types: Cigarettes   Last attempt to quit: 09/06/2014   Years since quitting: 8.5  Smokeless Tobacco Never   Tobacco Cessation:  A prescription for an FDA-approved tobacco cessation medication was offered at discharge and the patient refused   Blood Alcohol level:  Lab Results  Component Value Date   [facility] <10 03/12/2023   ETH <10 [id number]    Metabolic Disorder Labs:  Lab Results  Component Value Date   HGBA1C 5.3 08/06/2022   MPG 105.41 08/06/2022   MPG 108.28 07/06/2022   No results found for:  "PROLACTIN" Lab Results  Component Value Date   CHOL 184 08/06/2022   TRIG 169 (H) 08/06/2022   HDL 53 08/06/2022   CHOLHDL 3.5 08/06/2022   VLDL 34 08/06/2022   LDLCALC 97 08/06/2022   LDLCALC 119 (H) 07/06/2022    See Psychiatric Specialty Exam and Suicide Risk Assessment completed by Attending Physician prior to discharge.  Discharge destination:  Home  Is patient on multiple antipsychotic therapies at discharge:  No   Has Patient had three or more failed trials of antipsychotic monotherapy by history:  No  Recommended Plan for Multiple Antipsychotic Therapies: NA  Discharge Instructions     Diet - low sodium heart healthy   Complete by: As directed    Increase activity slowly   Complete by: As directed       Allergies as of 03/18/2023       Reactions   Tramadol Other (See Comments)   Seizures   Mold Extract [trichophyton] Other (See Comments)   Sneezing        Medication List     STOP taking these medications    amphetamine-dextroamphetamine 30 MG tablet Commonly known as: ADDERALL   clonazePAM 1 MG tablet Commonly known as: KLONOPIN   ondansetron 4 MG tablet Commonly known as: Zofran   QUEtiapine 100 MG tablet Commonly known as: SEROQUEL       TAKE these medications      Indication  escitalopram 10 MG tablet Commonly known as: LEXAPRO Take 1 tablet (10 mg total) by mouth daily.  Indication: Major Depressive Disorder   gabapentin 300 MG capsule Commonly known as: NEURONTIN Take 1 capsule (300 mg total) by mouth at bedtime. What changed:  when to take this reasons to take this  Indication: Sleep   traZODone 100 MG tablet Commonly known as: DESYREL Take 1 tablet (100 mg total) by mouth at bedtime.  Indication: [place] on 03/21/2023.   Why: Please present for scheduled appointment on March 22nd at [place] information: [place] [address] [phone]                 Follow-up recommendations:  Other:  Patient is encouraged not to go back to the use of Adderall or other stimulants which frequently causes her to become psychotic.  Referral made to  [facility].  Supply of medicine and prescription completed.  Comments: See above  Signed: Alethia Berthold, MD 03/18/2023, 10:43 AM

## 2023-03-18 NOTE — [facility] Suicide Risk Assessment (Signed)
[facility] Discharge Suicide Risk Assessment   Principal Problem: MDD (major depressive disorder), recurrent episode, moderate ([place]) Discharge Diagnoses: Principal Problem:   MDD (major depressive disorder), recurrent episode, moderate ([facility])   Total Time spent with patient: 30 minutes  Musculoskeletal: Strength & Muscle Tone: within normal limits Gait & Station: normal Patient leans: N/A  Psychiatric Specialty Exam  Presentation  General Appearance:  Bizarre  Eye Contact: Good  Speech: Clear and Coherent  Speech Volume: Normal  Handedness: Right   Mood and Affect  Mood: Euthymic  Duration of Depression Symptoms: Greater than two weeks  Affect: Congruent   Thought Process  Thought Processes: Coherent  Descriptions of Associations:Intact  Orientation:Full (Time, Place and Person)  Thought Content:Logical  History of Schizophrenia/Schizoaffective disorder:No  Duration of Psychotic Symptoms:N/A  Hallucinations:No data recorded Ideas of Reference:None  Suicidal Thoughts:No data recorded Homicidal Thoughts:No data recorded  Sensorium  Memory: Immediate Good; Recent Good; Remote Good  Judgment: Fair  Insight: Fair   Materials engineer: Fair  Attention Span: Fair  Recall: [employer] of Knowledge: Fair  Language: Fair   Psychomotor Activity  Psychomotor Activity:No data recorded  Assets  Assets: Financial Resources/Insurance; Housing; Social Support; Resilience   Sleep  Sleep:No data recorded  Physical Exam: Physical Exam Vitals and nursing note reviewed.  Constitutional:      Appearance: Normal appearance.  HENT:     Head: Normocephalic and atraumatic.     Mouth/Throat:     Pharynx: Oropharynx is clear.  Eyes:     Pupils: Pupils are equal, round, and reactive to light.  Cardiovascular:     Rate and Rhythm: Normal rate and regular rhythm.  Pulmonary:     Effort: Pulmonary effort is normal.     Breath  sounds: Normal breath sounds.  Abdominal:     General: Abdomen is flat.     Palpations: Abdomen is soft.  Musculoskeletal:        General: Normal range of motion.  Skin:    General: Skin is warm and dry.  Neurological:     General: No focal deficit present.     Mental Status: She is alert. Mental status is at baseline.  Psychiatric:        Mood and Affect: Mood normal.        Thought Content: Thought content normal.    Review of Systems  Constitutional: Negative.   HENT: Negative.    Eyes: Negative.   Respiratory: Negative.    Cardiovascular: Negative.   Gastrointestinal: Negative.   Musculoskeletal: Negative.   Skin: Negative.   Neurological: Negative.   Psychiatric/Behavioral: Negative.     Blood pressure 98/66, pulse 75, temperature 98.1 F (36.7 C), temperature source Oral, resp. rate 19, height 5\' 7"  (1.702 m), weight 61.7 kg, last menstrual period 02/19/2023, SpO2 98 %. Body mass index is 21.3 kg/m.  Mental Status Per Nursing Assessment::   On Admission:  NA  Demographic Factors:  Caucasian  Loss Factors: Legal issues  Historical Factors: Impulsivity  Risk Reduction Factors:   Sense of responsibility to family  Continued Clinical Symptoms:  Severe Anxiety and/or Agitation Depression:   Comorbid alcohol abuse/dependence Alcohol/Substance Abuse/Dependencies  Cognitive Features That Contribute To Risk:  None    Suicide Risk:  Minimal: No identifiable suicidal ideation.  Patients presenting with no risk factors but with morbid ruminations; may be classified as minimal risk based on the severity of the depressive symptoms   Follow-up Information     [facility] on 03/21/2023.   Why: Please present for scheduled appointment on March 22nd at [place] information: [place] [id number] [place] recommendations:  Other:  Patient reports mood is feeling  better.  She denies any suicidal thought.  Has not displayed any dangerous behavior in the hospital.  Mood and affect are improved to baseline denies suicidal thought.  Patient has been encouraged to stay on medication and to stay away from drug abuse and engage in outpatient mental health and will be given referral for outpatient follow-up.  Alethia Berthold, MD 03/18/2023, 10:33 AM

## 2023-03-18 NOTE — Progress Notes (Signed)
Patient isolative to self and room. Denies SI, Hi, AVH. Medication compliant. Appropriate with staff and peers. No complaints or concerns voiced. Is requesting discharge. Requested sleep med with good relief. Encouragement and support provided. Safety checks maintained. Medications given as prescribed. Pt receptive and remains safe on unit with q15 min checks.

## 2023-03-18 NOTE — Progress Notes (Signed)
Patient ID: Taylor Wiggins, female   DOB: May 27, 1981, 42 y.o.   MRN: [id number] Patient denies SI/HI/AVH. Belongings were returned to patient. Discharge instructions  including medication and follow up information were reviewed with patient and understanding was verbalized. Patient was not observed to be in distress at time of discharge. Patient was escorted out with staff to medical mall to be taken home by partner.

## 2023-03-21 ENCOUNTER — Other Ambulatory Visit: Payer: Self-pay

## 2023-04-07 ENCOUNTER — Encounter: Payer: 59 | Admitting: Physician Assistant

## 2023-04-24 ENCOUNTER — Emergency Department (HOSPITAL_BASED_OUTPATIENT_CLINIC_OR_DEPARTMENT_OTHER): Payer: 59 | Admitting: Radiology

## 2023-04-24 ENCOUNTER — Encounter (HOSPITAL_COMMUNITY): Payer: Self-pay | Admitting: Internal Medicine

## 2023-04-24 ENCOUNTER — Other Ambulatory Visit: Payer: Self-pay

## 2023-04-24 ENCOUNTER — Emergency Department (HOSPITAL_BASED_OUTPATIENT_CLINIC_OR_DEPARTMENT_OTHER): Payer: 59

## 2023-04-24 ENCOUNTER — Inpatient Hospital Stay (HOSPITAL_BASED_OUTPATIENT_CLINIC_OR_DEPARTMENT_OTHER)
Admission: EM | Admit: 2023-04-24 | Discharge: 2023-04-26 | DRG: 603 | Disposition: A | Discharge status: Home / Self Care | Payer: 59 | Attending: Internal Medicine | Admitting: Internal Medicine

## 2023-04-24 DIAGNOSIS — F4310 Post-traumatic stress disorder, unspecified: Secondary | ICD-10-CM | POA: Diagnosis present

## 2023-04-24 DIAGNOSIS — F32A Depression, unspecified: Secondary | ICD-10-CM | POA: Diagnosis not present

## 2023-04-24 DIAGNOSIS — M25571 Pain in right ankle and joints of right foot: Secondary | ICD-10-CM | POA: Diagnosis not present

## 2023-04-24 DIAGNOSIS — J45909 Unspecified asthma, uncomplicated: Secondary | ICD-10-CM | POA: Diagnosis present

## 2023-04-24 DIAGNOSIS — Z91048 Other nonmedicinal substance allergy status: Secondary | ICD-10-CM | POA: Diagnosis not present

## 2023-04-24 DIAGNOSIS — E871 Hypo-osmolality and hyponatremia: Secondary | ICD-10-CM | POA: Diagnosis present

## 2023-04-24 DIAGNOSIS — Z885 Allergy status to narcotic agent status: Secondary | ICD-10-CM | POA: Diagnosis not present

## 2023-04-24 DIAGNOSIS — F17210 Nicotine dependence, cigarettes, uncomplicated: Secondary | ICD-10-CM | POA: Diagnosis not present

## 2023-04-24 DIAGNOSIS — L03119 Cellulitis of unspecified part of limb: Secondary | ICD-10-CM | POA: Diagnosis not present

## 2023-04-24 DIAGNOSIS — R45851 Suicidal ideations: Secondary | ICD-10-CM | POA: Diagnosis not present

## 2023-04-24 DIAGNOSIS — F419 Anxiety disorder, unspecified: Secondary | ICD-10-CM | POA: Diagnosis present

## 2023-04-24 DIAGNOSIS — F902 Attention-deficit hyperactivity disorder, combined type: Secondary | ICD-10-CM | POA: Diagnosis present

## 2023-04-24 DIAGNOSIS — L03116 Cellulitis of left lower limb: Secondary | ICD-10-CM | POA: Diagnosis not present

## 2023-04-24 DIAGNOSIS — L03115 Cellulitis of right lower limb: Secondary | ICD-10-CM | POA: Diagnosis not present

## 2023-04-24 DIAGNOSIS — Z833 Family history of diabetes mellitus: Secondary | ICD-10-CM | POA: Diagnosis not present

## 2023-04-24 DIAGNOSIS — F1510 Other stimulant abuse, uncomplicated: Secondary | ICD-10-CM | POA: Diagnosis present

## 2023-04-24 DIAGNOSIS — M25473 Effusion, unspecified ankle: Secondary | ICD-10-CM | POA: Diagnosis present

## 2023-04-24 DIAGNOSIS — R609 Edema, unspecified: Secondary | ICD-10-CM | POA: Diagnosis not present

## 2023-04-24 DIAGNOSIS — Z79899 Other long term (current) drug therapy: Secondary | ICD-10-CM

## 2023-04-24 DIAGNOSIS — S99919A Unspecified injury of unspecified ankle, initial encounter: Secondary | ICD-10-CM | POA: Diagnosis not present

## 2023-04-24 DIAGNOSIS — M7989 Other specified soft tissue disorders: Secondary | ICD-10-CM | POA: Diagnosis not present

## 2023-04-24 DIAGNOSIS — Z818 Family history of other mental and behavioral disorders: Secondary | ICD-10-CM

## 2023-04-24 DIAGNOSIS — R600 Localized edema: Secondary | ICD-10-CM | POA: Diagnosis not present

## 2023-04-24 DIAGNOSIS — M25572 Pain in left ankle and joints of left foot: Secondary | ICD-10-CM | POA: Diagnosis not present

## 2023-04-24 LAB — CBC
HCT: 37 % (ref 36.0–46.0)
Hemoglobin: 12.3 g/dL (ref 12.0–15.0)
MCH: 31.5 pg (ref 26.0–34.0)
MCHC: 33.2 g/dL (ref 30.0–36.0)
MCV: 94.9 fL (ref 80.0–100.0)
Platelets: 322 10*3/uL (ref 150–400)
RBC: 3.9 MIL/uL (ref 3.87–5.11)
RDW: 14.1 % (ref 11.5–15.5)
WBC: 12.6 10*3/uL — ABNORMAL HIGH (ref 4.0–10.5)
nRBC: 0 % (ref 0.0–0.2)

## 2023-04-24 LAB — CBC WITH DIFFERENTIAL/PLATELET
Abs Immature Granulocytes: 0.05 10*3/uL (ref 0.00–0.07)
Basophils Absolute: 0 10*3/uL (ref 0.0–0.1)
Basophils Relative: 0 %
Eosinophils Absolute: 0.2 10*3/uL (ref 0.0–0.5)
Eosinophils Relative: 2 %
HCT: 35.4 % — ABNORMAL LOW (ref 36.0–46.0)
Hemoglobin: 11.7 g/dL — ABNORMAL LOW (ref 12.0–15.0)
Immature Granulocytes: 0 %
Lymphocytes Relative: 11 %
Lymphs Abs: 1.5 10*3/uL (ref 0.7–4.0)
MCH: 31.1 pg (ref 26.0–34.0)
MCHC: 33.1 g/dL (ref 30.0–36.0)
MCV: 94.1 fL (ref 80.0–100.0)
Monocytes Absolute: 0.9 10*3/uL (ref 0.1–1.0)
Monocytes Relative: 7 %
Neutro Abs: 10.7 10*3/uL — ABNORMAL HIGH (ref 1.7–7.7)
Neutrophils Relative %: 80 %
Platelets: 311 10*3/uL (ref 150–400)
RBC: 3.76 MIL/uL — ABNORMAL LOW (ref 3.87–5.11)
RDW: 14 % (ref 11.5–15.5)
WBC: 13.4 10*3/uL — ABNORMAL HIGH (ref 4.0–10.5)
nRBC: 0 % (ref 0.0–0.2)

## 2023-04-24 LAB — BASIC METABOLIC PANEL
Anion gap: 6 (ref 5–15)
BUN: 11 mg/dL (ref 6–20)
CO2: 28 mmol/L (ref 22–32)
Calcium: 9 mg/dL (ref 8.9–10.3)
Chloride: 99 mmol/L (ref 98–111)
Creatinine, Ser: 0.76 mg/dL (ref 0.44–1.00)
GFR, Estimated: >60 mL/min (ref >60)
Glucose, Bld: 89 mg/dL (ref 70–99)
Potassium: 3.6 mmol/L (ref 3.5–5.1)
Sodium: 133 mmol/L — ABNORMAL LOW (ref 135–145)

## 2023-04-24 LAB — HEPATIC FUNCTION PANEL
ALT: 174 U/L — ABNORMAL HIGH (ref 0–44)
AST: 85 U/L — ABNORMAL HIGH (ref 15–41)
Albumin: 3.8 g/dL (ref 3.5–5.0)
Alkaline Phosphatase: 67 U/L (ref 38–126)
Bilirubin, Direct: 0.1 mg/dL (ref 0.0–0.2)
Indirect Bilirubin: 1.1 mg/dL — ABNORMAL HIGH (ref 0.3–0.9)
Total Bilirubin: 1.2 mg/dL (ref 0.3–1.2)
Total Protein: 7.2 g/dL (ref 6.5–8.1)

## 2023-04-24 LAB — RAPID URINE DRUG SCREEN, HOSP PERFORMED
Amphetamines: POSITIVE — AB
Barbiturates: NOT DETECTED
Benzodiazepines: NOT DETECTED
Cocaine: NOT DETECTED
Opiates: NOT DETECTED
Tetrahydrocannabinol: NOT DETECTED

## 2023-04-24 LAB — CREATININE, SERUM
Creatinine, Ser: 0.82 mg/dL (ref 0.44–1.00)
GFR, Estimated: >60 mL/min (ref >60)

## 2023-04-24 LAB — LACTIC ACID, PLASMA: Lactic Acid, Venous: 0.6 mmol/L (ref 0.5–1.9)

## 2023-04-24 LAB — PREGNANCY, URINE: Preg Test, Ur: NEGATIVE

## 2023-04-24 MED ORDER — SENNOSIDES-DOCUSATE SODIUM 8.6-50 MG PO TABS: 1.0000 | ORAL_TABLET | Freq: Every evening | ORAL | Status: DC | PRN

## 2023-04-24 MED ORDER — GABAPENTIN 300 MG PO CAPS: 300.0000 mg | ORAL_CAPSULE | Freq: Every day | ORAL | Status: DC

## 2023-04-24 MED ORDER — CLONAZEPAM 1 MG PO TABS
1.0000 mg | ORAL_TABLET | Freq: Every day | ORAL | Status: DC
  Administered 2023-04-24 – 2023-04-25 (×2): 1 mg via ORAL
  Filled 2023-04-24 (×2): qty 1

## 2023-04-24 MED ORDER — CEFAZOLIN SODIUM-DEXTROSE 1-4 GM/50ML-% IV SOLN
1.0000 g | Freq: Once | INTRAVENOUS | Status: AC
  Administered 2023-04-24: 1 g via INTRAVENOUS
  Filled 2023-04-24: qty 50

## 2023-04-24 MED ORDER — IBUPROFEN 800 MG PO TABS
800.0000 mg | ORAL_TABLET | Freq: Once | ORAL | Status: AC
  Administered 2023-04-24: 800 mg via ORAL
  Filled 2023-04-24: qty 1

## 2023-04-24 MED ORDER — ESCITALOPRAM OXALATE 10 MG PO TABS: 10.0000 mg | ORAL_TABLET | Freq: Every day | ORAL | Status: DC

## 2023-04-24 MED ORDER — ENOXAPARIN SODIUM 40 MG/0.4ML IJ SOSY
40.0000 mg | PREFILLED_SYRINGE | Freq: Every day | INTRAMUSCULAR | Status: DC
  Administered 2023-04-24 – 2023-04-26 (×3): 40 mg via SUBCUTANEOUS
  Filled 2023-04-24 (×3): qty 0.4

## 2023-04-24 MED ORDER — ONDANSETRON HCL 4 MG PO TABS: 4.0000 mg | ORAL_TABLET | Freq: Four times a day (QID) | ORAL | Status: DC | PRN

## 2023-04-24 MED ORDER — ALBUTEROL SULFATE (2.5 MG/3ML) 0.083% IN NEBU: 2.5000 mg | INHALATION_SOLUTION | RESPIRATORY_TRACT | Status: DC | PRN

## 2023-04-24 MED ORDER — CEFAZOLIN SODIUM-DEXTROSE 1-4 GM/50ML-% IV SOLN
1.0000 g | Freq: Three times a day (TID) | INTRAVENOUS | Status: DC
  Administered 2023-04-24 – 2023-04-26 (×5): 1 g via INTRAVENOUS
  Filled 2023-04-24 (×7): qty 50

## 2023-04-24 MED ORDER — ONDANSETRON HCL 4 MG/2ML IJ SOLN: 4.0000 mg | Freq: Four times a day (QID) | INTRAMUSCULAR | Status: DC | PRN

## 2023-04-24 MED ORDER — TRAZODONE HCL 100 MG PO TABS: 100.0000 mg | ORAL_TABLET | Freq: Every day | ORAL | Status: DC

## 2023-04-24 MED ORDER — ACETAMINOPHEN 650 MG RE SUPP: 650.0000 mg | Freq: Four times a day (QID) | RECTAL | Status: DC | PRN

## 2023-04-24 MED ORDER — LACTATED RINGERS IV BOLUS
1000.0000 mL | Freq: Once | INTRAVENOUS | Status: AC
  Administered 2023-04-24: 1000 mL via INTRAVENOUS

## 2023-04-24 MED ORDER — ACETAMINOPHEN 325 MG PO TABS
650.0000 mg | ORAL_TABLET | Freq: Four times a day (QID) | ORAL | Status: DC | PRN
  Administered 2023-04-25: 650 mg via ORAL
  Filled 2023-04-24: qty 2

## 2023-04-24 MED ORDER — METOPROLOL TARTRATE 5 MG/5ML IV SOLN: 5.0000 mg | Freq: Four times a day (QID) | INTRAVENOUS | Status: DC | PRN

## 2023-04-24 NOTE — ED Notes (Signed)
Patient transported to Ultrasound 

## 2023-04-24 NOTE — ED Provider Notes (Signed)
[place] EMERGENCY DEPARTMENT AT [facility] Provider Note   CSN: [id number] Arrival date & time: 04/24/23  5284     History  Chief Complaint  Patient presents with   Ankle Pain    Taylor Wiggins is a 42 y.o. female.  HPI  42 year old female history of depression substance abuse disorder, Modena Jansky today complaining of pain, redness swelling in bilateral ankles.  She denies any trauma.  She reports that she noticed the redness and swelling and pain this morning.  She has had some short relapse of her substance abuse using methamphetamines several days ago.  She denies any injection of substances in the recent past.  She denies any history of endocarditis or other infections.  She has not noted fever or chills.    Home Medications Prior to Admission medications   Medication Sig Start Date End Date Taking? Authorizing Provider  escitalopram (LEXAPRO) 10 MG tablet Take 1 tablet (10 mg total) by mouth daily. 03/18/23   Clapacs, Jackquline Denmark, MD  gabapentin (NEURONTIN) 300 MG capsule Take 1 capsule (300 mg total) by mouth at bedtime. 03/17/23   Clapacs, Jackquline Denmark, MD  traZODone (DESYREL) 100 MG tablet Take 1 tablet (100 mg total) by mouth at bedtime. 03/17/23   Clapacs, Jackquline Denmark, MD      Allergies    Tramadol and Mold extract [trichophyton]    Review of Systems   Review of Systems  Physical Exam Updated Vital Signs BP 111/75   Pulse (!) 108   Temp 98.2 F (36.8 C) (Oral)   Resp 19   SpO2 100%  Physical Exam Vitals and nursing note reviewed.  Constitutional:      Appearance: Normal appearance.  HENT:     Head: Normocephalic.     Right Ear: External ear normal.     Left Ear: External ear normal.     Nose: Nose normal.     Mouth/Throat:     Mouth: Mucous membranes are moist.  Eyes:     Extraocular Movements: Extraocular movements intact.     Pupils: Pupils are equal, round, and reactive to light.  Cardiovascular:     Rate and Rhythm: Regular rhythm. Tachycardia present.      Pulses: Normal pulses.  Pulmonary:     Effort: Pulmonary effort is normal.  Abdominal:     General: Abdomen is flat.     Palpations: Abdomen is soft.  Musculoskeletal:        General: Swelling and tenderness present.     Cervical back: Normal range of motion.     Comments: Lower extremity significant for erythema with diffuse tenderness medial aspect of ankle on right and diffusely left.  There is some erythema creeping proximally up bilateral calves. There are no cords palpated  Skin:    General: Skin is warm.     Capillary Refill: Capillary refill takes less than 2 seconds.  Neurological:     General: No focal deficit present.     Mental Status: She is alert.  Psychiatric:        Mood and Affect: Mood normal.     ED Results / Procedures / Treatments   Labs (all labs ordered are listed, but only abnormal results are displayed) Labs Reviewed  CBC WITH DIFFERENTIAL/PLATELET - Abnormal; Notable for the following components:      Result Value   WBC 13.4 (*)    RBC 3.76 (*)    Hemoglobin 11.7 (*)    HCT 35.4 (*)  Neutro Abs 10.7 (*)    All other components within normal limits  BASIC METABOLIC PANEL - Abnormal; Notable for the following components:   Sodium 133 (*)    All other components within normal limits  RAPID URINE DRUG SCREEN, HOSP PERFORMED - Abnormal; Notable for the following components:   Amphetamines POSITIVE (*)    All other components within normal limits  LACTIC ACID, PLASMA  PREGNANCY, URINE  LACTIC ACID, PLASMA    EKG None  Radiology US Venous Img Lower Bilateral (DVT)  Result Date: 04/24/2023 CLINICAL DATA:  Bilateral leg edema. EXAM: BILATERAL LOWER EXTREMITY VENOUS DOPPLER ULTRASOUND TECHNIQUE: Gray-scale sonography with graded compression, as well as color Doppler and duplex ultrasound were performed to evaluate the lower extremity deep venous systems from the level of the common femoral vein and including the common femoral, femoral,  profunda femoral, popliteal and calf veins including the posterior tibial, peroneal and gastrocnemius veins when visible. The superficial great saphenous vein was also interrogated. Spectral Doppler was utilized to evaluate flow at rest and with distal augmentation maneuvers in the common femoral, femoral and popliteal veins. COMPARISON:  None Available. FINDINGS: RIGHT LOWER EXTREMITY Common Femoral Vein: No evidence of thrombus. Normal compressibility, respiratory phasicity and response to augmentation. Saphenofemoral Junction: No evidence of thrombus. Normal compressibility and flow on color Doppler imaging. Profunda Femoral Vein: No evidence of thrombus. Normal compressibility and flow on color Doppler imaging. Femoral Vein: No evidence of thrombus. Normal compressibility, respiratory phasicity and response to augmentation. Popliteal Vein: No evidence of thrombus. Normal compressibility, respiratory phasicity and response to augmentation. Calf Veins: No evidence of thrombus. Normal compressibility and flow on color Doppler imaging. Superficial Great Saphenous Vein: No evidence of thrombus. Normal compressibility. Other Findings: Right popliteal cystic structure that measures 3.8 x 1.2 x 2.0 cm. LEFT LOWER EXTREMITY Common Femoral Vein: No evidence of thrombus. Normal compressibility, respiratory phasicity and response to augmentation. Saphenofemoral Junction: No evidence of thrombus. Normal compressibility and flow on color Doppler imaging. Profunda Femoral Vein: No evidence of thrombus. Normal compressibility and flow on color Doppler imaging. Femoral Vein: No evidence of thrombus. Normal compressibility, respiratory phasicity and response to augmentation. Popliteal Vein: No evidence of thrombus. Normal compressibility, respiratory phasicity and response to augmentation. Calf Veins: No evidence of thrombus. Normal compressibility and flow on color Doppler imaging. Superficial Great Saphenous Vein: No evidence  of thrombus. Normal compressibility. Other Findings:  None. IMPRESSION: 1. No evidence of deep venous thrombosis in either lower extremity. 2. Right knee Baker's cyst. Electronically Signed   By: Richarda Overlie M.D.   On: 04/24/2023 08:55   DG Ankle Complete Right  Result Date: 04/24/2023 CLINICAL DATA:  Right ankle pain and swelling. EXAM: RIGHT ANKLE - COMPLETE 3+ VIEW COMPARISON:  None Available. FINDINGS: The ankle mortise is maintained. No ankle fracture. No osteochondral lesion. No obvious joint effusion. The mid and hindfoot bony structures are intact. Diffuse subcutaneous soft tissue swelling is noted. No gas is seen in the soft tissues. IMPRESSION: 1. No acute bony findings or significant degenerative changes. 2. Diffuse subcutaneous soft tissue swelling. Electronically Signed   By: Rudie Meyer M.D.   On: 04/24/2023 08:34   DG Ankle Complete Left  Result Date: 04/24/2023 CLINICAL DATA:  Pain, swelling and redness. EXAM: LEFT ANKLE COMPLETE - 3+ VIEW COMPARISON:  None Available. FINDINGS: The ankle mortise is maintained. No ankle fracture. Rounded density near the distal tip of the medial malleolus is likely an old avulsion injury unfused secondary ossification center.  The mid and hindfoot bony structures are intact. No obvious ankle joint effusion. No osteochondral lesion. IMPRESSION: No acute bony findings. Electronically Signed   By: Rudie Meyer M.D.   On: 04/24/2023 08:33    Procedures Procedures    Medications Ordered in ED Medications  ceFAZolin (ANCEF) IVPB 1 g/50 mL premix (has no administration in time range)  lactated ringers bolus 1,000 mL (1,000 mLs Intravenous New Bag/Given 04/24/23 0824)  ibuprofen (ADVIL) tablet 800 mg (800 mg Oral Given 04/24/23 1610)    ED Course/ Medical Decision Making/ A&P Clinical Course as of 04/24/23 1013  Thu Apr 24, 2023  0915 Left ankle x-Jaydn Moscato reviewed and interpreted no evidence of acute bony abnormality does appear to be some edematous changes  in the posterior aspect of the leg Radiologist interpretation notes no acute abnormality [DR]  0916 Right ankle x-Yolando Gillum reviewed interpreted and no evidence of acute abnormality is noted and radiologist notes some diffuse subcutaneous soft tissue swelling. [DR]  9604 Lactic acid reviewed interpreted and normal [DR]  0916 Patient metabolic panel reviewed interpreted significant for mild hyponatremia of sodium 133 otherwise within normal limits [DR]  0917 CBC reviewed interpreted significant for leukocytosis with white blood cell count 13,400 hemoglobin 11 [DR]    Clinical Course User Index [DR] Margarita Grizzle, MD                             Medical Decision Making Amount and/or Complexity of Data Reviewed Labs: ordered. Radiology: ordered.  Risk Prescription drug management.   42 year old female presents today complaining of bilateral lower extremity redness and pain.  No known injury.  Patient has history of substance abuse disorder but denies that injections. She appears to have redness and tenderness without crepitus in her bilateral lower extremities. Differential diagnosis includes but is not limited to simple cellulitis, necrotizing fasciitis, DVTs, sepsis Symptoms most in consistent with infection based on exam, leukocytosis, no evidence of DVT. Given patient's high risk status will start IV antibiotics and plan admission for ongoing evaluation and management Discussed care with Dr. Erenest Blank who will admit for further evaluation and treatment Discussed results with the patient.  She denies any history of MRSA or endocarditis. She remains hemodynamically stable        Final Clinical Impression(s) / ED Diagnoses Final diagnoses:  Cellulitis of lower extremity, unspecified laterality    Rx / DC Orders ED Discharge Orders     None         Margarita Grizzle, MD 04/24/23 1013

## 2023-04-24 NOTE — ED Triage Notes (Signed)
Pt. Here via [facility] for c/o bilateral ankle pai. C/o swelling and redness that she noticed this am

## 2023-04-24 NOTE — ED Notes (Signed)
Called [facility] -- informed that the patient Bed Assignment is ready 

## 2023-04-24 NOTE — ED Notes (Signed)
[facility] at bedside 

## 2023-04-24 NOTE — H&P (Signed)
History and Physical  Taylor Wiggins UEA:[id number] DOB: 11/07/1981 DOA: 04/24/2023  PCP: Patient, No Pcp Per   Chief Complaint: foot redness   HPI: Taylor Wiggins is a 42 y.o. married female with medical history significant for depression and polysubstance abuse presents complaining of pain, redness and swelling in the bilateral ankles since early this morning.  She had a short relapse of her substance abuse and using methamphetamine from her husband a few days ago.  She denies any injectable substance use, specifically denies injecting anything into her legs or feet.  No prior history of endocarditis or other severe infections.  She denies any fevers, chills, she had some nausea without vomiting.  No chest pain, no cough.  ED Course: Evaluation in the ER revealed normal vital signs.  Lab work was obtained, WBC 13,000, hemoglobin 12 otherwise unremarkable.  Due to the bilateral leg swelling, she had x-rays of the feet as well as bilateral lower extremity Doppler ultrasound which were negative for DVT.  She was given a dose of IV Ancef and observation admission was requested to ensure improvement.  Review of Systems: Please see HPI for pertinent positives and negatives. A complete 10 system review of systems are otherwise negative.  Past Medical History:  Diagnosis Date   Adult ADHD    Anxiety    Asthma    Hepatitis C    IBS (irritable bowel syndrome)    Opioid use disorder    No longer on buprenorphine therapy   PTSD (post-traumatic stress disorder)    Past Surgical History:  Procedure Laterality Date   BREAST REDUCTION SURGERY  2005    Social History:  reports that she has been smoking cigarettes. She has been smoking an average of .5 packs per day. She has never used smokeless tobacco. She reports that she does not currently use drugs after having used the following drugs: Methamphetamines. She reports that she does not drink alcohol.   Allergies  Allergen Reactions    Tramadol Other (See Comments)    Seizures    Mold Extract [Trichophyton] Other (See Comments)    Sneezing    Family History  Problem Relation Age of Onset   Anxiety disorder Mother    Diabetes Mother    Anxiety disorder Father      Prior to Admission medications   Medication Sig Start Date End Date Taking? Authorizing Provider  escitalopram (LEXAPRO) 10 MG tablet Take 1 tablet (10 mg total) by mouth daily. 03/18/23   Clapacs, Jackquline Denmark, MD  gabapentin (NEURONTIN) 300 MG capsule Take 1 capsule (300 mg total) by mouth at bedtime. 03/17/23   Clapacs, Jackquline Denmark, MD  traZODone (DESYREL) 100 MG tablet Take 1 tablet (100 mg total) by mouth at bedtime. 03/17/23   Clapacs, Jackquline Denmark, MD    Physical Exam: BP 118/76 (BP Location: Left Arm)   Pulse 98   Temp 98.4 F (36.9 C) (Oral)   Resp 16   Ht  (1.727 m)   Wt 69.9 kg   SpO2 100%   BMI 23.45 kg/m   General:  Alert, oriented, calm, in no acute distress, pleasant and cooperative, she looks nontoxic Eyes: EOMI, clear conjuctivae, white sclerea Neck: supple, no masses, trachea mildline  Cardiovascular: RRR, no murmurs or rubs Respiratory: clear to auscultation bilaterally, no wheezes, no crackles  Abdomen: soft, nontender, nondistended, normal bowel tones heard  Skin: dry, no rashes; nonpitting nontender edema bilateral ankles and feet, with some mild warmth and erythema especially centered  at the bilateral internal malleoli Musculoskeletal: no joint effusions, normal range of motion  Psychiatric: appropriate affect, normal speech  Neurologic: extraocular muscles intact, clear speech, moving all extremities with intact sensorium           Labs on Admission:  Basic Metabolic Panel: Recent Labs  Lab 04/24/23 0729  NA 133*  K 3.6  CL 99  CO2 28  GLUCOSE 89  BUN 11  CREATININE 0.76  CALCIUM 9.0   Liver Function Tests: No results for input(s): "AST", "ALT", "ALKPHOS", "BILITOT", "PROT", "ALBUMIN" in the last 168 hours. No results  for input(s): "LIPASE", "AMYLASE" in the last 168 hours. No results for input(s): "AMMONIA" in the last 168 hours. CBC: Recent Labs  Lab 04/24/23 0729  WBC 13.4*  NEUTROABS 10.7*  HGB 11.7*  HCT 35.4*  MCV 94.1  PLT 311   Cardiac Enzymes: No results for input(s): "CKTOTAL", "CKMB", "CKMBINDEX", "TROPONINI" in the last 168 hours.  BNP (last 3 results) No results for input(s): "BNP" in the last 8760 hours.  ProBNP (last 3 results) No results for input(s): "PROBNP" in the last 8760 hours.  CBG: No results for input(s): "GLUCAP" in the last 168 hours.  Radiological Exams on Admission: US Venous Img Lower Bilateral (DVT)  Result Date: 04/24/2023 CLINICAL DATA:  Bilateral leg edema. EXAM: BILATERAL LOWER EXTREMITY VENOUS DOPPLER ULTRASOUND TECHNIQUE: Gray-scale sonography with graded compression, as well as color Doppler and duplex ultrasound were performed to evaluate the lower extremity deep venous systems from the level of the common femoral vein and including the common femoral, femoral, profunda femoral, popliteal and calf veins including the posterior tibial, peroneal and gastrocnemius veins when visible. The superficial great saphenous vein was also interrogated. Spectral Doppler was utilized to evaluate flow at rest and with distal augmentation maneuvers in the common femoral, femoral and popliteal veins. COMPARISON:  None Available. FINDINGS: RIGHT LOWER EXTREMITY Common Femoral Vein: No evidence of thrombus. Normal compressibility, respiratory phasicity and response to augmentation. Saphenofemoral Junction: No evidence of thrombus. Normal compressibility and flow on color Doppler imaging. Profunda Femoral Vein: No evidence of thrombus. Normal compressibility and flow on color Doppler imaging. Femoral Vein: No evidence of thrombus. Normal compressibility, respiratory phasicity and response to augmentation. Popliteal Vein: No evidence of thrombus. Normal compressibility, respiratory  phasicity and response to augmentation. Calf Veins: No evidence of thrombus. Normal compressibility and flow on color Doppler imaging. Superficial Great Saphenous Vein: No evidence of thrombus. Normal compressibility. Other Findings: Right popliteal cystic structure that measures 3.8 x 1.2 x 2.0 cm. LEFT LOWER EXTREMITY Common Femoral Vein: No evidence of thrombus. Normal compressibility, respiratory phasicity and response to augmentation. Saphenofemoral Junction: No evidence of thrombus. Normal compressibility and flow on color Doppler imaging. Profunda Femoral Vein: No evidence of thrombus. Normal compressibility and flow on color Doppler imaging. Femoral Vein: No evidence of thrombus. Normal compressibility, respiratory phasicity and response to augmentation. Popliteal Vein: No evidence of thrombus. Normal compressibility, respiratory phasicity and response to augmentation. Calf Veins: No evidence of thrombus. Normal compressibility and flow on color Doppler imaging. Superficial Great Saphenous Vein: No evidence of thrombus. Normal compressibility. Other Findings:  None. IMPRESSION: 1. No evidence of deep venous thrombosis in either lower extremity. 2. Right knee Baker's cyst. Electronically Signed   By: Richarda Overlie M.D.   On: 04/24/2023 08:55   DG Ankle Complete Right  Result Date: 04/24/2023 CLINICAL DATA:  Right ankle pain and swelling. EXAM: RIGHT ANKLE - COMPLETE 3+ VIEW COMPARISON:  None Available. FINDINGS:  The ankle mortise is maintained. No ankle fracture. No osteochondral lesion. No obvious joint effusion. The mid and hindfoot bony structures are intact. Diffuse subcutaneous soft tissue swelling is noted. No gas is seen in the soft tissues. IMPRESSION: 1. No acute bony findings or significant degenerative changes. 2. Diffuse subcutaneous soft tissue swelling. Electronically Signed   By: Rudie Meyer M.D.   On: 04/24/2023 08:34   DG Ankle Complete Left  Result Date: 04/24/2023 CLINICAL DATA:   Pain, swelling and redness. EXAM: LEFT ANKLE COMPLETE - 3+ VIEW COMPARISON:  None Available. FINDINGS: The ankle mortise is maintained. No ankle fracture. Rounded density near the distal tip of the medial malleolus is likely an old avulsion injury unfused secondary ossification center. The mid and hindfoot bony structures are intact. No obvious ankle joint effusion. No osteochondral lesion. IMPRESSION: No acute bony findings. Electronically Signed   By: Rudie Meyer M.D.   On: 04/24/2023 08:33    Assessment/Plan Cellulitis of foot with bilateral ankle edema-on examination she does have some warmth and erythema of the bilateral ankles, with no evidence of injury, no areas of fluctuance or tenderness.  X-rays without any acute findings such as bony abnormality, subcutaneous gas, etc.  I do not feel that her bilateral ankle edema is directly related to the cellulitis, which is very mild. -Observation admission -Continue empiric IV Ancef -MRSA swab, will change to p.o. Bactrim if positive -Check albumin level -Keep feet elevated when at rest  Hyponatremia-this is minimal and clinically inconsequential, monitor with daily labs   Anxiety disorder   Attention deficit hyperactivity disorder (ADHD), combined type   Methamphetamine abuse   Suicidal ideation-she screened positive for suicidal ideation on admission screening, will consult psychiatry for evaluation  DVT prophylaxis: Lovenox     Code Status: Full Code  Consults called: None  Admission status: Observation  Time spent: 46 minutes  Curlee Bogan Sharlette Dense MD [facility] Pager [phone]  If 7PM-7AM, please contact night-coverage [email or web address] Password [email or web address]  04/24/2023, 1:45 PM

## 2023-04-24 NOTE — Plan of Care (Signed)
Plan of Care Note for Accepted Transfer   Patient: Taylor Wiggins    XLK:[id number]     Facility requesting transfer: [facility] Requesting Provider: Dr. Rosalia Hammers  72F with hx methamphetamine abuse presents with bilateral leg cellulitis. Non-toxic, received Ancef and requires hospital observation due to high risk of noncompliance and development of sepsis.  Most recent vitals, labs and radiology:  Blood pressure 111/75, pulse (!) 108, temperature 98.2 F (36.8 C), temperature source Oral, resp. rate 19, SpO2 100 %.      Component Value Date/Time   WBC 13.4 (H) 04/24/2023 0729   RBC 3.76 (L) 04/24/2023 0729   HGB 11.7 (L) 04/24/2023 0729   HGB 13.6 04/28/2015 1654   HCT 35.4 (L) 04/24/2023 0729   HCT 41.0 04/28/2015 1654   PLT 311 04/24/2023 0729   PLT 430 04/28/2015 1654   MCV 94.1 04/24/2023 0729   MCV 93 04/28/2015 1654   MCH 31.1 04/24/2023 0729   MCHC 33.1 04/24/2023 0729   RDW 14.0 04/24/2023 0729   RDW 13.9 04/28/2015 1654   LYMPHSABS 1.5 04/24/2023 0729   LYMPHSABS 1.5 10/31/2014 0605   MONOABS 0.9 04/24/2023 0729   MONOABS 1.4 (H) 10/31/2014 0605   EOSABS 0.2 04/24/2023 0729   EOSABS 0.2 10/31/2014 0605   BASOSABS 0.0 04/24/2023 0729   BASOSABS 0.1 10/31/2014 0605      Latest Ref Rng & Units 04/24/2023    7:29 AM 03/12/2023   11:23 PM 11/24/2022    6:47 AM  BMP  Glucose 70 - 99 mg/dL 89  366  440   BUN 6 - 20 mg/dL Creatinine 0.44 - 1.00 mg/dL 3.47  4.25  9.56   Sodium 135 - 145 mmol/L 133  139  138   Potassium 3.5 - 5.1 mmol/L 3.6  4.1  4.6   Chloride 98 - 111 mmol/L 99  103  102   CO2 22 - 32 mmol/L Calcium 8.9 - 10.3 mg/dL 9.0  9.6  9.5       Component Value Date/Time   NA 133 (L) 04/24/2023 0729   NA 139 04/28/2015 1654   K 3.6 04/24/2023 0729   K 3.5 04/28/2015 1654   CL 99 04/24/2023 0729   CL 101 04/28/2015 1654   CO2 28 04/24/2023 0729   CO2 29 04/28/2015 1654   GLUCOSE 89 04/24/2023 0729   GLUCOSE 117 (H) 04/28/2015 1654    BUN 11 04/24/2023 0729   BUN 14 04/28/2015 1654   CREATININE 0.76 04/24/2023 0729   CREATININE 0.69 04/28/2015 1654   CALCIUM 9.0 04/24/2023 0729   CALCIUM 9.5 04/28/2015 1654   PROT 8.9 (H) 03/12/2023 2323   PROT 6.4 10/31/2014 0605   ALBUMIN 4.7 03/12/2023 2323   ALBUMIN 3.5 10/31/2014 0605   AST 25 03/12/2023 2323   AST 20 10/31/2014 0605   ALT 35 03/12/2023 2323   ALT 20 10/31/2014 0605   ALKPHOS 54 03/12/2023 2323   ALKPHOS 52 10/31/2014 0605   BILITOT 0.7 03/12/2023 2323   BILITOT 0.3 10/31/2014 0605   GFRNONAA >60 04/24/2023 0729   GFRNONAA >60 04/28/2015 1654   GFRAA >60 04/01/2020 1629   GFRAA >60 04/28/2015 1654    US Venous Img Lower Bilateral (DVT)  Result Date: 04/24/2023 CLINICAL DATA:  Bilateral leg edema. EXAM: BILATERAL LOWER EXTREMITY VENOUS DOPPLER ULTRASOUND TECHNIQUE: Gray-scale sonography with graded compression, as well as color Doppler and duplex ultrasound were  performed to evaluate the lower extremity deep venous systems from the level of the common femoral vein and including the common femoral, femoral, profunda femoral, popliteal and calf veins including the posterior tibial, peroneal and gastrocnemius veins when visible. The superficial great saphenous vein was also interrogated. Spectral Doppler was utilized to evaluate flow at rest and with distal augmentation maneuvers in the common femoral, femoral and popliteal veins. COMPARISON:  None Available. FINDINGS: RIGHT LOWER EXTREMITY Common Femoral Vein: No evidence of thrombus. Normal compressibility, respiratory phasicity and response to augmentation. Saphenofemoral Junction: No evidence of thrombus. Normal compressibility and flow on color Doppler imaging. Profunda Femoral Vein: No evidence of thrombus. Normal compressibility and flow on color Doppler imaging. Femoral Vein: No evidence of thrombus. Normal compressibility, respiratory phasicity and response to augmentation. Popliteal Vein: No evidence of  thrombus. Normal compressibility, respiratory phasicity and response to augmentation. Calf Veins: No evidence of thrombus. Normal compressibility and flow on color Doppler imaging. Superficial Great Saphenous Vein: No evidence of thrombus. Normal compressibility. Other Findings: Right popliteal cystic structure that measures 3.8 x 1.2 x 2.0 cm. LEFT LOWER EXTREMITY Common Femoral Vein: No evidence of thrombus. Normal compressibility, respiratory phasicity and response to augmentation. Saphenofemoral Junction: No evidence of thrombus. Normal compressibility and flow on color Doppler imaging. Profunda Femoral Vein: No evidence of thrombus. Normal compressibility and flow on color Doppler imaging. Femoral Vein: No evidence of thrombus. Normal compressibility, respiratory phasicity and response to augmentation. Popliteal Vein: No evidence of thrombus. Normal compressibility, respiratory phasicity and response to augmentation. Calf Veins: No evidence of thrombus. Normal compressibility and flow on color Doppler imaging. Superficial Great Saphenous Vein: No evidence of thrombus. Normal compressibility. Other Findings:  None. IMPRESSION: 1. No evidence of deep venous thrombosis in either lower extremity. 2. Right knee Baker's cyst. Electronically Signed   By: Richarda Overlie M.D.   On: 04/24/2023 08:55   DG Ankle Complete Right  Result Date: 04/24/2023 CLINICAL DATA:  Right ankle pain and swelling. EXAM: RIGHT ANKLE - COMPLETE 3+ VIEW COMPARISON:  None Available. FINDINGS: The ankle mortise is maintained. No ankle fracture. No osteochondral lesion. No obvious joint effusion. The mid and hindfoot bony structures are intact. Diffuse subcutaneous soft tissue swelling is noted. No gas is seen in the soft tissues. IMPRESSION: 1. No acute bony findings or significant degenerative changes. 2. Diffuse subcutaneous soft tissue swelling. Electronically Signed   By: Rudie Meyer M.D.   On: 04/24/2023 08:34   DG Ankle Complete  Left  Result Date: 04/24/2023 CLINICAL DATA:  Pain, swelling and redness. EXAM: LEFT ANKLE COMPLETE - 3+ VIEW COMPARISON:  None Available. FINDINGS: The ankle mortise is maintained. No ankle fracture. Rounded density near the distal tip of the medial malleolus is likely an old avulsion injury unfused secondary ossification center. The mid and hindfoot bony structures are intact. No obvious ankle joint effusion. No osteochondral lesion. IMPRESSION: No acute bony findings. Electronically Signed   By: Rudie Meyer M.D.   On: 04/24/2023 08:33    The patient has been accepted for transfer to [facility] or [facility], depending on bed and resource availability.  The patient will remain under the care and responsibility of the referring provider until they have arrived to our inpatient facility.  Author: Rogan Wigley Sharlette Dense, MD  04/24/2023  Check [email or web address] for on-call coverage.  Nursing staff, Please call [facility] Admits & Consults System-Wide number on Amion as soon as patient's arrival, so appropriate admitting provider can evaluate the pt.

## 2023-04-25 DIAGNOSIS — L03116 Cellulitis of left lower limb: Secondary | ICD-10-CM | POA: Diagnosis not present

## 2023-04-25 DIAGNOSIS — Z818 Family history of other mental and behavioral disorders: Secondary | ICD-10-CM | POA: Diagnosis not present

## 2023-04-25 DIAGNOSIS — J45909 Unspecified asthma, uncomplicated: Secondary | ICD-10-CM | POA: Diagnosis not present

## 2023-04-25 DIAGNOSIS — E871 Hypo-osmolality and hyponatremia: Secondary | ICD-10-CM | POA: Diagnosis not present

## 2023-04-25 DIAGNOSIS — R45851 Suicidal ideations: Secondary | ICD-10-CM | POA: Diagnosis not present

## 2023-04-25 DIAGNOSIS — F32A Depression, unspecified: Secondary | ICD-10-CM | POA: Diagnosis not present

## 2023-04-25 DIAGNOSIS — F17210 Nicotine dependence, cigarettes, uncomplicated: Secondary | ICD-10-CM | POA: Diagnosis not present

## 2023-04-25 DIAGNOSIS — L03119 Cellulitis of unspecified part of limb: Secondary | ICD-10-CM | POA: Diagnosis not present

## 2023-04-25 DIAGNOSIS — F902 Attention-deficit hyperactivity disorder, combined type: Secondary | ICD-10-CM | POA: Diagnosis not present

## 2023-04-25 DIAGNOSIS — Z91048 Other nonmedicinal substance allergy status: Secondary | ICD-10-CM | POA: Diagnosis not present

## 2023-04-25 DIAGNOSIS — L03115 Cellulitis of right lower limb: Secondary | ICD-10-CM | POA: Diagnosis not present

## 2023-04-25 DIAGNOSIS — Z79899 Other long term (current) drug therapy: Secondary | ICD-10-CM | POA: Diagnosis not present

## 2023-04-25 DIAGNOSIS — F4310 Post-traumatic stress disorder, unspecified: Secondary | ICD-10-CM | POA: Diagnosis not present

## 2023-04-25 DIAGNOSIS — Z885 Allergy status to narcotic agent status: Secondary | ICD-10-CM | POA: Diagnosis not present

## 2023-04-25 DIAGNOSIS — Z833 Family history of diabetes mellitus: Secondary | ICD-10-CM | POA: Diagnosis not present

## 2023-04-25 DIAGNOSIS — F1510 Other stimulant abuse, uncomplicated: Secondary | ICD-10-CM | POA: Diagnosis not present

## 2023-04-25 LAB — BASIC METABOLIC PANEL
Anion gap: 8 (ref 5–15)
BUN: 12 mg/dL (ref 6–20)
CO2: 23 mmol/L (ref 22–32)
Calcium: 8.4 mg/dL — ABNORMAL LOW (ref 8.9–10.3)
Chloride: 102 mmol/L (ref 98–111)
Creatinine, Ser: 0.74 mg/dL (ref 0.44–1.00)
GFR, Estimated: >60 mL/min (ref >60)
Glucose, Bld: 117 mg/dL — ABNORMAL HIGH (ref 70–99)
Potassium: 4.3 mmol/L (ref 3.5–5.1)
Sodium: 133 mmol/L — ABNORMAL LOW (ref 135–145)

## 2023-04-25 LAB — CBC
HCT: 34.8 % — ABNORMAL LOW (ref 36.0–46.0)
Hemoglobin: 11.3 g/dL — ABNORMAL LOW (ref 12.0–15.0)
MCH: 31.1 pg (ref 26.0–34.0)
MCHC: 32.5 g/dL (ref 30.0–36.0)
MCV: 95.9 fL (ref 80.0–100.0)
Platelets: 288 10*3/uL (ref 150–400)
RBC: 3.63 MIL/uL — ABNORMAL LOW (ref 3.87–5.11)
RDW: 14.2 % (ref 11.5–15.5)
WBC: 9 10*3/uL (ref 4.0–10.5)
nRBC: 0 % (ref 0.0–0.2)

## 2023-04-25 NOTE — Progress Notes (Signed)
PROGRESS NOTE    Taylor Wiggins  WUJ:[id number] DOB: [birth year] DOA: 04/24/2023 PCP: Patient, No Pcp Per  Outpatient Specialists:     Brief Narrative:  Patient is a 42 year old female past medical history significant for depression and polysubstance abuse.  Patient was admitted with cellulitis of lower legs and feet.  Patient is currently on IV Ancef.  No fever or chills.  Redness of the feet persists.   Assessment & Plan:   Principal Problem:   Cellulitis of foot Active Problems:   Anxiety disorder   Attention deficit hyperactivity disorder (ADHD), combined type   Methamphetamine abuse (HCC)   Suicidal ideation   Ankle edema   Cellulitis of feet and lower legs with bilateral ankle edema: -X-ray findings noted. -Patient denies trauma or intravenous IV use to lower extremities. -Continue IV Ancef for now. -Have a low threshold to add vancomycin. -Continue to manage expectantly.   Hyponatremia: -Mild. -Repeat BMP tomorrow. -For further workup if hyponatremia persists.  Anxiety disorder: -Patient is on Klonopin 1 Mg at nighttime. -Will have low threshold to add Lexapro.  Attention deficit hyperactivity disorder (ADHD), combined type: Methamphetamine abuse:   Suicidal ideation: -she screened positive for suicidal ideation on admission screening, will consult psychiatry for evaluation -Will defer to the psychiatry team.  DVT prophylaxis: Subcutaneous Lovenox. Code Status: Full code. Family Communication:  Disposition Plan: Home eventually.   Consultants:  None.  Procedures:  None.  Antimicrobials:  IV Ancef. Low threshold to add vancomycin.   Subjective: Cellulitis of lower legs and feet persist.  Objective: Vitals:   04/24/23 2122 04/25/23 0040 04/25/23 0547 04/25/23 1410  BP: 105/71 103/66 103/69 111/72  Pulse: 86 88 90 95  Resp: 18 18 18 20   Temp: 98.1 F (36.7 C) 98.3 F (36.8 C) 98.3 F (36.8 C) 99.6 F (37.6 C)  TempSrc: Oral Oral  Oral Oral  SpO2: 100% 100% 100% 100%  Weight:      Height:        Intake/Output Summary (Last 24 hours) at 04/25/2023 1450 Last data filed at 04/25/2023 0300 Gross per 24 hour  Intake 50 ml  Output --  Net 50 ml   Filed Weights   04/24/23 1237  Weight: 69.9 kg    Examination:  General exam: Appears calm and comfortable  Respiratory system: Clear to auscultation.  Cardiovascular system: S1 & S2 heard Gastrointestinal system: Abdomen is soft and nontender.  Central nervous system: Alert and oriented.  Patient moves all extremities.   Extremities: Redness of bilateral feet and lower legs.  Edema of lower extremity.   Data Reviewed: I have personally reviewed following labs and imaging studies  CBC: Recent Labs  Lab 04/24/23 0729 04/24/23 1400 04/25/23 0531  WBC 13.4* 12.6* 9.0  NEUTROABS 10.7*  --   --   HGB 11.7* 12.3 11.3*  HCT 35.4* 37.0 34.8*  MCV 94.1 94.9 95.9  PLT 311 322 288   Basic Metabolic Panel: Recent Labs  Lab 04/24/23 0729 04/24/23 1400 04/25/23 0531  NA 133*  --  133*  K 3.6  --  4.3  CL 99  --  102  CO2 28  --  23  GLUCOSE 89  --  117*  BUN 11  --  12  CREATININE 0.76 0.82 0.74  CALCIUM 9.0  --  8.4*   GFR: Estimated Creatinine Clearance: 94.3 mL/min (by C-G formula based on SCr of 0.74 mg/dL). Liver Function Tests: Recent Labs  Lab 04/24/23 1400  AST 85*  ALT  174*  ALKPHOS 67  BILITOT 1.2  PROT 7.2  ALBUMIN 3.8   No results for input(s): "LIPASE", "AMYLASE" in the last 168 hours. No results for input(s): "AMMONIA" in the last 168 hours. Coagulation Profile: No results for input(s): "INR", "PROTIME" in the last 168 hours. Cardiac Enzymes: No results for input(s): "CKTOTAL", "CKMB", "CKMBINDEX", "TROPONINI" in the last 168 hours. BNP (last 3 results) No results for input(s): "PROBNP" in the last 8760 hours. HbA1C: No results for input(s): "HGBA1C" in the last 72 hours. CBG: No results for input(s): "GLUCAP" in the last 168  hours. Lipid Profile: No results for input(s): "CHOL", "HDL", "LDLCALC", "TRIG", "CHOLHDL", "LDLDIRECT" in the last 72 hours. Thyroid Function Tests: No results for input(s): "TSH", "T4TOTAL", "FREET4", "T3FREE", "THYROIDAB" in the last 72 hours. Anemia Panel: No results for input(s): "VITAMINB12", "FOLATE", "FERRITIN", "TIBC", "IRON", "RETICCTPCT" in the last 72 hours. Urine analysis:    Component Value Date/Time   COLORURINE YELLOW (A) 11/24/2022 0647   APPEARANCEUR CLOUDY (A) 11/24/2022 0647   APPEARANCEUR Clear 04/28/2015 1654   LABSPEC 1.017 11/24/2022 0647   LABSPEC 1.005 04/28/2015 1654   PHURINE 6.0 11/24/2022 0647   GLUCOSEU NEGATIVE 11/24/2022 0647   GLUCOSEU Negative 04/28/2015 1654   HGBUR NEGATIVE 11/24/2022 0647   BILIRUBINUR NEGATIVE 11/24/2022 0647   BILIRUBINUR Negative 04/28/2015 1654   KETONESUR NEGATIVE 11/24/2022 0647   PROTEINUR NEGATIVE 11/24/2022 0647   UROBILINOGEN 0.2 02/16/2014 1653   NITRITE POSITIVE (A) 11/24/2022 0647   LEUKOCYTESUR NEGATIVE 11/24/2022 0647   LEUKOCYTESUR Negative 04/28/2015 1654   Sepsis Labs: @LABRCNTIP (procalcitonin:4,lacticidven:4)  )No results found for this or any previous visit (from the past 240 hour(s)).       Radiology Studies: US Venous Img Lower Bilateral (DVT)  Result Date: 04/24/2023 CLINICAL DATA:  Bilateral leg edema. EXAM: BILATERAL LOWER EXTREMITY VENOUS DOPPLER ULTRASOUND TECHNIQUE: Gray-scale sonography with graded compression, as well as color Doppler and duplex ultrasound were performed to evaluate the lower extremity deep venous systems from the level of the common femoral vein and including the common femoral, femoral, profunda femoral, popliteal and calf veins including the posterior tibial, peroneal and gastrocnemius veins when visible. The superficial great saphenous vein was also interrogated. Spectral Doppler was utilized to evaluate flow at rest and with distal augmentation maneuvers in the common  femoral, femoral and popliteal veins. COMPARISON:  None Available. FINDINGS: RIGHT LOWER EXTREMITY Common Femoral Vein: No evidence of thrombus. Normal compressibility, respiratory phasicity and response to augmentation. Saphenofemoral Junction: No evidence of thrombus. Normal compressibility and flow on color Doppler imaging. Profunda Femoral Vein: No evidence of thrombus. Normal compressibility and flow on color Doppler imaging. Femoral Vein: No evidence of thrombus. Normal compressibility, respiratory phasicity and response to augmentation. Popliteal Vein: No evidence of thrombus. Normal compressibility, respiratory phasicity and response to augmentation. Calf Veins: No evidence of thrombus. Normal compressibility and flow on color Doppler imaging. Superficial Great Saphenous Vein: No evidence of thrombus. Normal compressibility. Other Findings: Right popliteal cystic structure that measures 3.8 x 1.2 x 2.0 cm. LEFT LOWER EXTREMITY Common Femoral Vein: No evidence of thrombus. Normal compressibility, respiratory phasicity and response to augmentation. Saphenofemoral Junction: No evidence of thrombus. Normal compressibility and flow on color Doppler imaging. Profunda Femoral Vein: No evidence of thrombus. Normal compressibility and flow on color Doppler imaging. Femoral Vein: No evidence of thrombus. Normal compressibility, respiratory phasicity and response to augmentation. Popliteal Vein: No evidence of thrombus. Normal compressibility, respiratory phasicity and response to augmentation. Calf Veins: No evidence of thrombus.  Normal compressibility and flow on color Doppler imaging. Superficial Great Saphenous Vein: No evidence of thrombus. Normal compressibility. Other Findings:  None. IMPRESSION: 1. No evidence of deep venous thrombosis in either lower extremity. 2. Right knee Baker's cyst. Electronically Signed   By: Richarda Overlie M.D.   On: 04/24/2023 08:55   DG Ankle Complete Right  Result Date:  04/24/2023 CLINICAL DATA:  Right ankle pain and swelling. EXAM: RIGHT ANKLE - COMPLETE 3+ VIEW COMPARISON:  None Available. FINDINGS: The ankle mortise is maintained. No ankle fracture. No osteochondral lesion. No obvious joint effusion. The mid and hindfoot bony structures are intact. Diffuse subcutaneous soft tissue swelling is noted. No gas is seen in the soft tissues. IMPRESSION: 1. No acute bony findings or significant degenerative changes. 2. Diffuse subcutaneous soft tissue swelling. Electronically Signed   By: Rudie Meyer M.D.   On: 04/24/2023 08:34   DG Ankle Complete Left  Result Date: 04/24/2023 CLINICAL DATA:  Pain, swelling and redness. EXAM: LEFT ANKLE COMPLETE - 3+ VIEW COMPARISON:  None Available. FINDINGS: The ankle mortise is maintained. No ankle fracture. Rounded density near the distal tip of the medial malleolus is likely an old avulsion injury unfused secondary ossification center. The mid and hindfoot bony structures are intact. No obvious ankle joint effusion. No osteochondral lesion. IMPRESSION: No acute bony findings. Electronically Signed   By: Rudie Meyer M.D.   On: 04/24/2023 08:33        Scheduled Meds:  clonazePAM  1 mg Oral QHS   enoxaparin (LOVENOX) injection  40 mg Subcutaneous Daily   Continuous Infusions:   ceFAZolin (ANCEF) IV 1 g (04/25/23 1439)     LOS: 0 days    Time spent: 35 minutes.    Berton Mount, MD  [facility] Pager #: [phone] 7PM-7AM contact night coverage as above

## 2023-04-25 NOTE — Progress Notes (Signed)
SDOH Interventions Today    Flowsheet Row Most Recent Value  SDOH Interventions   Food Insecurity Interventions Inpatient TOC, Other (Comment)  [Resources added to AVS]  Housing Interventions Inpatient TOC, Other (Comment)  [Resources added to AVS]

## 2023-04-25 NOTE — Progress Notes (Signed)
   04/25/23 1435  Spiritual Encounters  Type of Visit Initial  Care provided to: Patient  Referral source Nurse (RN/NT/LPN)  Reason for visit Urgent spiritual support  OnCall Visit No   Chaplain responded to a spiritual consult for advanced directive education and prayer.. The patient Taylor Wiggins shared a life full of challenges that she is experiencing to include job and housing insecurity. She explored a bit the situation she considered as the last thing she needed as possibly an opportunity to reflect and assess her life. She expressed concern about her mother who is in the hospital also at [facility]. We went over the advanced directive documents and the steps to be taken when she has completed them.  We prayed for her healing and that Jailen may receive the support she needs to live her life to the fullest.   Wendie Simmer Long  [phone]

## 2023-04-25 NOTE — Consult Note (Cosign Needed Addendum)
Psych consult received for suicide screen. Writer introduced self and reason for consult., in which she appeared confused. She denies any acute symptoms of mania at this time to include impulsivity, grandiosity, mood lability, hypersexuality.  She does not present with any of the above symptoms on this evaluation.  She further denies any depressive symptoms to include anhedonia, hopelessness, worthlessness, guilty, suicidal.  She denies any acute psychosis, paranoia.  She does not appear to be displaying any or responding to internal stimuli, external stimuli, or exhibiting delusional thought disorder.  Patient denies any access to weapons, denies any alcohol and or substance abuse.  Patient denies any auditory and/or visual hallucinations, does not appear to be responding to internal or external stimuli.  There is no evidence of delusional thought content and patient appears to answer all questions appropriately.  At this time patient appears to be psychiatrically stable to discharge home, with support system services in place. She denies suicidal ideations, suicidal thoughts, and or recent self harm. She endorses history of depression but denies suicidal thoughts or answering of any suicidal questions on admission. She denies any acute psychiatric concerns that warrant an inpatient psychiatric evaluation.  Psychiatric consult service to sign off at this time.

## 2023-04-26 LAB — RENAL FUNCTION PANEL
Albumin: 3 g/dL — ABNORMAL LOW (ref 3.5–5.0)
Anion gap: 7 (ref 5–15)
BUN: 12 mg/dL (ref 6–20)
CO2: 22 mmol/L (ref 22–32)
Calcium: 8.3 mg/dL — ABNORMAL LOW (ref 8.9–10.3)
Chloride: 108 mmol/L (ref 98–111)
Creatinine, Ser: 0.67 mg/dL (ref 0.44–1.00)
GFR, Estimated: >60 mL/min (ref >60)
Glucose, Bld: 126 mg/dL — ABNORMAL HIGH (ref 70–99)
Phosphorus: 2.5 mg/dL (ref 2.5–4.6)
Potassium: 4.4 mmol/L (ref 3.5–5.1)
Sodium: 137 mmol/L (ref 135–145)

## 2023-04-26 LAB — PHOSPHORUS: Phosphorus: 2.6 mg/dL (ref 2.5–4.6)

## 2023-04-26 LAB — SEDIMENTATION RATE: Sed Rate: 35 mm/hr — ABNORMAL HIGH (ref 0–22)

## 2023-04-26 MED ORDER — AMOXICILLIN-POT CLAVULANATE 875-125 MG PO TABS: 1.0000 | ORAL_TABLET | Freq: Two times a day (BID) | ORAL | 0 refills | Status: DC

## 2023-04-26 NOTE — Progress Notes (Addendum)
PROGRESS NOTE    Taylor Wiggins  UUV:[id number] DOB: [birth year] DOA: 04/24/2023 PCP: Patient, No Pcp Per  Outpatient Specialists:     Brief Narrative:  Patient is a 42 year old female past medical history significant for depression and polysubstance abuse.  Patient was admitted with cellulitis of lower legs and feet.  Patient is currently on IV Ancef.  No fever or chills.  Redness of the feet/lower legs is improving.  04/26/2023: Patient seen.  Cellulitis is improving.      Assessment & Plan:   Principal Problem:   Cellulitis of foot Active Problems:   Anxiety disorder   Attention deficit hyperactivity disorder (ADHD), combined type   Methamphetamine abuse (HCC)   Suicidal ideation   Ankle edema   Cellulitis of lower extremity, unspecified laterality   Cellulitis of feet and lower legs with bilateral ankle edema: -X-ray findings noted. -Patient denies trauma or intravenous IV use to lower extremities. -Continue IV Ancef for now.  Cellulitis has improved. -Sed rate was 35. -Continue to manage expectantly.      Hyponatremia: -Resolved.  Sodium is 137 today.  Anxiety disorder: -Patient is on Klonopin 1 Mg at nighttime. -Will have low threshold to add Lexapro.  Attention deficit hyperactivity disorder (ADHD), combined type: Methamphetamine abuse:   Suicidal ideation: -she screened positive for suicidal ideation on admission screening, will consult psychiatry for evaluation -Will defer to the psychiatry team.  DVT prophylaxis: Subcutaneous Lovenox. Code Status: Full code. Family Communication:  Disposition Plan: Home eventually.   Consultants:  None.  Procedures:  None.  Antimicrobials:  IV Ancef. Low threshold to add vancomycin.   Subjective: Cellulitis of lower legs and feet persist.  Objective: Vitals:   04/25/23 0547 04/25/23 1410 04/25/23 2023 04/26/23 0429  BP: 103/69 111/72 106/75 94/61  Pulse: 90 95 94 91  Resp: 18 20 15 16   Temp: 98.3  F (36.8 C) 99.6 F (37.6 C) 100.1 F (37.8 C) 98.3 F (36.8 C)  TempSrc: Oral Oral Oral Oral  SpO2: 100% 100% 100% 100%  Weight:      Height:        Intake/Output Summary (Last 24 hours) at 04/26/2023 1120 Last data filed at 04/26/2023 0900 Gross per 24 hour  Intake 680 ml  Output --  Net 680 ml    Filed Weights   04/24/23 1237  Weight: 69.9 kg    Examination:  General exam: Appears calm and comfortable  Respiratory system: Clear to auscultation.  Cardiovascular system: S1 & S2 heard Gastrointestinal system: Abdomen is soft and nontender.  Central nervous system: Alert and oriented.  Patient moves all extremities.   Extremities: Redness of bilateral feet and lower legs and edema have improved.   Data Reviewed: I have personally reviewed following labs and imaging studies  CBC: Recent Labs  Lab 04/24/23 0729 04/24/23 1400 04/25/23 0531  WBC 13.4* 12.6* 9.0  NEUTROABS 10.7*  --   --   HGB 11.7* 12.3 11.3*  HCT 35.4* 37.0 34.8*  MCV 94.1 94.9 95.9  PLT 311 322 288    Basic Metabolic Panel: Recent Labs  Lab 04/24/23 0729 04/24/23 1400 04/25/23 0531 04/26/23 0619 04/26/23 0620  NA 133*  --  133*  --  137  K 3.6  --  4.3  --  4.4  CL 99  --  102  --  108  CO2 28  --  23  --  22  GLUCOSE 89  --  117*  --  126*  BUN 11  --  12  --  12  CREATININE 0.76 0.82 0.74  --  0.67  CALCIUM 9.0  --  8.4*  --  8.3*  PHOS  --   --   --  2.6 2.5    GFR: Estimated Creatinine Clearance: 94.3 mL/min (by C-G formula based on SCr of 0.67 mg/dL). Liver Function Tests: Recent Labs  Lab 04/24/23 1400 04/26/23 0620  AST 85*  --   ALT 174*  --   ALKPHOS 67  --   BILITOT 1.2  --   PROT 7.2  --   ALBUMIN 3.8 3.0*    No results for input(s): "LIPASE", "AMYLASE" in the last 168 hours. No results for input(s): "AMMONIA" in the last 168 hours. Coagulation Profile: No results for input(s): "INR", "PROTIME" in the last 168 hours. Cardiac Enzymes: No results for  input(s): "CKTOTAL", "CKMB", "CKMBINDEX", "TROPONINI" in the last 168 hours. BNP (last 3 results) No results for input(s): "PROBNP" in the last 8760 hours. HbA1C: No results for input(s): "HGBA1C" in the last 72 hours. CBG: No results for input(s): "GLUCAP" in the last 168 hours. Lipid Profile: No results for input(s): "CHOL", "HDL", "LDLCALC", "TRIG", "CHOLHDL", "LDLDIRECT" in the last 72 hours. Thyroid Function Tests: No results for input(s): "TSH", "T4TOTAL", "FREET4", "T3FREE", "THYROIDAB" in the last 72 hours. Anemia Panel: No results for input(s): "VITAMINB12", "FOLATE", "FERRITIN", "TIBC", "IRON", "RETICCTPCT" in the last 72 hours. Urine analysis:    Component Value Date/Time   COLORURINE YELLOW (A) 11/24/2022 0647   APPEARANCEUR CLOUDY (A) 11/24/2022 0647   APPEARANCEUR Clear 04/28/2015 1654   LABSPEC 1.017 11/24/2022 0647   LABSPEC 1.005 04/28/2015 1654   PHURINE 6.0 11/24/2022 0647   GLUCOSEU NEGATIVE 11/24/2022 0647   GLUCOSEU Negative 04/28/2015 1654   HGBUR NEGATIVE 11/24/2022 0647   BILIRUBINUR NEGATIVE 11/24/2022 0647   BILIRUBINUR Negative 04/28/2015 1654   KETONESUR NEGATIVE 11/24/2022 0647   PROTEINUR NEGATIVE 11/24/2022 0647   UROBILINOGEN 0.2 02/16/2014 1653   NITRITE POSITIVE (A) 11/24/2022 0647   LEUKOCYTESUR NEGATIVE 11/24/2022 0647   LEUKOCYTESUR Negative 04/28/2015 1654   Sepsis Labs: @LABRCNTIP (procalcitonin:4,lacticidven:4)  )No results found for this or any previous visit (from the past 240 hour(s)).       Radiology Studies: No results found.      Scheduled Meds:  clonazePAM  1 mg Oral QHS   enoxaparin (LOVENOX) injection  40 mg Subcutaneous Daily   Continuous Infusions:   ceFAZolin (ANCEF) IV 100 mL/hr at 04/26/23 0900     LOS: 1 day    Time spent: 35 minutes.    Berton Mount, MD  [facility] Pager #: [phone] 7PM-7AM contact night coverage as above

## 2023-04-26 NOTE — TOC Transition Note (Addendum)
Transition of Care [facility]) - CM/SW Discharge Note  Patient Details  Name: Taylor Wiggins MRN: [id number] Date of Birth: 12-18-1981  [facility] CM/SW Contact:  Ewing Schlein, LCSW Phone Number: 04/26/2023, 1:37 PM  Clinical Narrative: Patient will need a taxi voucher to get to [facility] on [address] [place], [place] [address] as that is where patient's car is parked. Taxi voucher provided to nursing station. Substance use resources added to AVS. TOC signing off.  Final next level of care: Home/Self Care Barriers to Discharge: Barriers Resolved  Patient Goals and CMS Choice Choice offered to / list presented to : NA  Discharge Plan and Services Additional resources added to the After Visit Summary for           DME Arranged: N/A DME Agency: NA  Social Determinants of Health (SDOH) Interventions SDOH Screenings   Food Insecurity: Food Insecurity Present (04/24/2023)  Housing: High Risk (04/24/2023)  Transportation Needs: No Transportation Needs (04/24/2023)  Utilities: Not At Risk (04/24/2023)  Alcohol Screen: Low Risk  (03/13/2023)  Depression (PHQ2-9): Low Risk  (12/05/2022)  Tobacco Use: High Risk (04/24/2023)   Readmission Risk Interventions     No data to display

## 2023-04-26 NOTE — Discharge Summary (Signed)
Physician Discharge Summary  Patient ID: Taylor Wiggins MRN: [id number] DOB/AGE: 41-Nov-1982 40 y.o.  Admit date: 04/24/2023 Discharge date: 04/26/2023  Admission Diagnoses:  Discharge Diagnoses:  Principal Problem:   Cellulitis of foot Active Problems:   Anxiety disorder   Attention deficit hyperactivity disorder (ADHD), combined type   Methamphetamine abuse (HCC)   Suicidal ideation   Ankle edema   Cellulitis of lower extremity, unspecified laterality   Discharged Condition: stable  Hospital Course: Patient is a 42 year old female past medical history significant for depression and polysubstance abuse.  Patient was admitted with cellulitis involving the lower legs and feet.  Patient was admitted and managed with IV Ancef.  Patient has improved significantly.  Patient will be discharged back home on oral Augmentin twice daily for the next 5 days.  Patient will follow-up with primary care provider within 1 week of discharge.  Cellulitis of feet and lower legs with bilateral ankle edema: -X-ray findings noted. -Patient denies trauma or intravenous IV use to lower extremities. -Patient was treated with IV antibiotics. -Cellulitis has improved. -Patient will be discharged back on today on Augmentin.    Hyponatremia: -Resolved.  Sodium is 137 today.   Anxiety disorder: -Patient is on Klonopin 1 Mg at nighttime. -Will have low threshold to add Lexapro.   Attention deficit hyperactivity disorder (ADHD), combined type: Methamphetamine abuse:   Suicidal ideation: -she screened positive for suicidal ideation on admission screening, will consult psychiatry for evaluation -Will defer to the psychiatry team.  Consults: None  Significant Diagnostic Studies:   Treatments: Patient was treated with IV antibiotics.  Discharge Exam: Blood pressure 104/72, pulse 90, temperature 98.3 F (36.8 C), temperature source Oral, resp. rate 20, height 5\' 8"  (1.727 m), weight 69.9 kg, SpO2  100 %.   Disposition: Discharge disposition: 01-Home or Self Care       Discharge Instructions     Diet - low sodium heart healthy   Complete by: As directed    Increase activity slowly   Complete by: As directed       Allergies as of 04/26/2023       Reactions   Tramadol Other (See Comments)   Seizures   Mold Extract [trichophyton] Other (See Comments)   Sneezing        Medication List     STOP taking these medications    escitalopram 10 MG tablet Commonly known as: LEXAPRO   gabapentin 300 MG capsule Commonly known as: NEURONTIN   traZODone 100 MG tablet Commonly known as: DESYREL       TAKE these medications    amoxicillin-clavulanate 875-125 MG tablet Commonly known as: AUGMENTIN Take 1 tablet by mouth 2 (two) times daily for 5 days.   aspirin EC 325 MG tablet Take 325 mg by mouth daily as needed for mild pain (or headaches).   clonazePAM 1 MG tablet Commonly known as: KLONOPIN Take 1 mg by mouth See admin instructions. Take 1 mg by mouth at bedtime and an additional 1 mg once a day as needed for anxiety   OMEGA-3 FISH OIL PO Take 1 capsule by mouth daily with breakfast.   VITAMIN B-12 PO Take 1 tablet by mouth daily with breakfast.   VITAMIN C PO Take 1 tablet by mouth daily with breakfast.        Time spent: 35 Minutes  Signed: Barnetta Chapel 04/26/2023, 12:35 PM

## 2023-04-29 ENCOUNTER — Other Ambulatory Visit: Payer: Self-pay

## 2023-04-29 ENCOUNTER — Emergency Department
Admission: EM | Admit: 2023-04-29 | Discharge: 2023-04-29 | Disposition: A | Discharge status: Home / Self Care | Payer: 59 | Attending: Emergency Medicine | Admitting: Emergency Medicine

## 2023-04-29 DIAGNOSIS — M7989 Other specified soft tissue disorders: Secondary | ICD-10-CM

## 2023-04-29 DIAGNOSIS — R2243 Localized swelling, mass and lump, lower limb, bilateral: Secondary | ICD-10-CM | POA: Diagnosis not present

## 2023-04-29 LAB — BASIC METABOLIC PANEL
Anion gap: 5 (ref 5–15)
BUN: 7 mg/dL (ref 6–20)
CO2: 28 mmol/L (ref 22–32)
Calcium: 8.7 mg/dL — ABNORMAL LOW (ref 8.9–10.3)
Chloride: 102 mmol/L (ref 98–111)
Creatinine, Ser: 0.73 mg/dL (ref 0.44–1.00)
GFR, Estimated: >60 mL/min (ref >60)
Glucose, Bld: 111 mg/dL — ABNORMAL HIGH (ref 70–99)
Potassium: 4.3 mmol/L (ref 3.5–5.1)
Sodium: 135 mmol/L (ref 135–145)

## 2023-04-29 LAB — CBC
HCT: 37.7 % (ref 36.0–46.0)
Hemoglobin: 12 g/dL (ref 12.0–15.0)
MCH: 30.6 pg (ref 26.0–34.0)
MCHC: 31.8 g/dL (ref 30.0–36.0)
MCV: 96.2 fL (ref 80.0–100.0)
Platelets: 375 10*3/uL (ref 150–400)
RBC: 3.92 MIL/uL (ref 3.87–5.11)
RDW: 14.3 % (ref 11.5–15.5)
WBC: 5.3 10*3/uL (ref 4.0–10.5)
nRBC: 0 % (ref 0.0–0.2)

## 2023-04-29 LAB — LACTIC ACID, PLASMA: Lactic Acid, Venous: 1.9 mmol/L (ref 0.5–1.9)

## 2023-04-29 MED ORDER — SULFAMETHOXAZOLE-TRIMETHOPRIM 800-160 MG PO TABS
1.0000 | ORAL_TABLET | Freq: Once | ORAL | Status: AC
  Administered 2023-04-29: 1 via ORAL
  Filled 2023-04-29: qty 1

## 2023-04-29 MED ORDER — SULFAMETHOXAZOLE-TRIMETHOPRIM 800-160 MG PO TABS: 1.0000 | ORAL_TABLET | Freq: Two times a day (BID) | ORAL | 0 refills | Status: DC

## 2023-04-29 NOTE — ED Notes (Signed)
Pt discharge to home. Pt VSS, GCS 15, NAD. Pt verbalized understanding of discharge instructions with no additional questions at this time.  

## 2023-04-29 NOTE — ED Provider Notes (Signed)
[facility] Provider Note  Patient Contact: 3:14 PM (approximate)   History   Foot Pain   HPI  Taylor Wiggins is a 42 y.o. female with a history of polysubstance abuse and recent admission for feet cellulitis, presents to the emergency department for a wound check.  Patient was admitted on 04/24/2023 for possible cellulitis after patient had a comprehensive workup in the emergency department which included bilateral lower extremity ultrasounds.  X-rays were also obtained at this emergency department encounter which showed no evidence of osteomyelitis or other acute abnormality.  Patient states that she was discharged and never picked up her Augmentin and she feels like her symptoms are slightly worse.  She denies fever and chills.  No vomiting or diarrhea.  She can still bear weight.      Physical Exam   Triage Vital Signs: ED Triage Vitals [04/29/23 1345]  Enc Vitals Group     BP 100/81     Pulse Rate (!) 101     Resp 18     Temp 98.6 F (37 C)     Temp src      SpO2 97 %     Weight      Height      Head Circumference      Peak Flow      Pain Score 7     Pain Loc      Pain Edu?      Excl. in GC?     Most recent vital signs: Vitals:   04/29/23 1345  BP: 100/81  Pulse: (!) 101  Resp: 18  Temp: 98.6 F (37 C)  SpO2: 97%     General: Alert and in no acute distress. Eyes:  PERRL. EOMI. Head: No acute traumatic findings ENT:      Nose: No congestion/rhinnorhea.      Mouth/Throat: Mucous membranes are moist. Neck: No stridor. No cervical spine tenderness to palpation. Cardiovascular:  Good peripheral perfusion Respiratory: Normal respiratory effort without tachypnea or retractions. Lungs CTAB. Good air entry to the bases with no decreased or absent breath sounds. Gastrointestinal: Bowel sounds 4 quadrants. Soft and nontender to palpation. No guarding or rigidity. No palpable masses. No distention. No CVA tenderness.* Musculoskeletal:  Full range of motion to all extremities.  Neurologic:  No gross focal neurologic deficits are appreciated.  Skin: Patient has dusky erythema along the tops of the bilateral feet with 2+ pitting edema. Palpable dorsalis pedis pulse, bilaterally and symmetrically.  Other:   ED Results / Procedures / Treatments   Labs (all labs ordered are listed, but only abnormal results are displayed) Labs Reviewed  BASIC METABOLIC PANEL - Abnormal; Notable for the following components:      Result Value   Glucose, Bld 111 (*)    Calcium 8.7 (*)    All other components within normal limits  CBC  LACTIC ACID, PLASMA  LACTIC ACID, PLASMA         PROCEDURES:  Critical Care performed: No  Procedures   MEDICATIONS ORDERED IN ED: Medications  sulfamethoxazole-trimethoprim (BACTRIM DS) 800-160 MG per tablet 1 tablet (has no administration in time range)     IMPRESSION / MDM / ASSESSMENT AND PLAN / ED COURSE  I reviewed the triage vital signs and the nursing notes.                             Assessment and plan:  Foot  cellulitis 42 year old female presents to the emergency department with some very mild dusky erythema along the dorsal aspect of the bilateral feet and 2+ pitting edema.  Patient was mildly tachycardic at triage but vital signs were otherwise reassuring.  Edema does not extend to the bilateral calves and there is no streaking.  Patient is resting comfortably with no breathlessness or upper back pain.  I do suspect that patient might have mild overlying cellulitis and it could be worsening and she did not pick up her antibiotics after being discharged.  I did read and discharge instructions from the hospital that patient was supposed to have wound culture and it appears as though this did not take place.  I will transition patient from Augmentin to Bactrim to provide better MRSA coverage.  I recommended compression stockings and elevation at night with return precautions given  to return to the emergency department with new or worsening symptoms.  Patient feels comfortable with this plan given reassuring labs in the emergency department.  She was started on her first dose of Bactrim while in the ED.  All patient questions were answered. Clinical Course as of 04/29/23 1514  Tue Apr 29, 2023  1506 Lactic Acid, Venous: 1.9 [AA]    Clinical Course User Index [AA] Richard Miu, Student-PA     FINAL CLINICAL IMPRESSION(S) / ED DIAGNOSES   Final diagnoses:  Bilateral swelling of feet     Rx / DC Orders   ED Discharge Orders          Ordered    sulfamethoxazole-trimethoprim (BACTRIM DS) 800-160 MG tablet  2 times daily        04/29/23 1511             Note:  This document was prepared using Dragon voice recognition software and may include unintentional dictation errors.   Pia Mau [place], PA-C 04/29/23 1520    Merwyn Katos, MD 04/29/23 [phone]

## 2023-04-29 NOTE — ED Triage Notes (Addendum)
Pt comes with c/o bilateral foot swelling the patient states she was recently admitted for cellulitis. Pt states she thinks it is getting worse.  Pt has noticeable swelling and redness to both ankles and feet.

## 2023-04-29 NOTE — Discharge Instructions (Addendum)
Take Bactrim twice daily for seven days.  Stop taking Augmentin.  Start wearing compression stocking and keep feet elevated at night.

## 2023-05-05 ENCOUNTER — Encounter (HOSPITAL_COMMUNITY): Payer: Self-pay

## 2023-05-05 ENCOUNTER — Emergency Department (HOSPITAL_COMMUNITY)
Admission: EM | Admit: 2023-05-05 | Discharge: 2023-05-06 | Disposition: A | Discharge status: Home / Self Care | Payer: 59 | Attending: Emergency Medicine | Admitting: Emergency Medicine

## 2023-05-05 ENCOUNTER — Other Ambulatory Visit: Payer: Self-pay

## 2023-05-05 DIAGNOSIS — E871 Hypo-osmolality and hyponatremia: Secondary | ICD-10-CM | POA: Diagnosis not present

## 2023-05-05 DIAGNOSIS — L03119 Cellulitis of unspecified part of limb: Secondary | ICD-10-CM | POA: Diagnosis not present

## 2023-05-05 DIAGNOSIS — R110 Nausea: Secondary | ICD-10-CM | POA: Diagnosis not present

## 2023-05-05 DIAGNOSIS — F1910 Other psychoactive substance abuse, uncomplicated: Secondary | ICD-10-CM | POA: Diagnosis not present

## 2023-05-05 DIAGNOSIS — L03116 Cellulitis of left lower limb: Secondary | ICD-10-CM | POA: Diagnosis not present

## 2023-05-05 DIAGNOSIS — J029 Acute pharyngitis, unspecified: Secondary | ICD-10-CM

## 2023-05-05 DIAGNOSIS — R7401 Elevation of levels of liver transaminase levels: Secondary | ICD-10-CM

## 2023-05-05 DIAGNOSIS — R609 Edema, unspecified: Secondary | ICD-10-CM | POA: Diagnosis not present

## 2023-05-05 DIAGNOSIS — D75839 Thrombocytosis, unspecified: Secondary | ICD-10-CM | POA: Diagnosis not present

## 2023-05-05 DIAGNOSIS — J45909 Unspecified asthma, uncomplicated: Secondary | ICD-10-CM | POA: Diagnosis not present

## 2023-05-05 DIAGNOSIS — R4182 Altered mental status, unspecified: Secondary | ICD-10-CM | POA: Diagnosis not present

## 2023-05-05 DIAGNOSIS — L0390 Cellulitis, unspecified: Secondary | ICD-10-CM | POA: Diagnosis not present

## 2023-05-05 NOTE — ED Triage Notes (Signed)
Pt BIB EMS from home for cellulitis on both ankles. Pt received antibiotics but didn't take them. Pt slurring words and admits to injecting something this morning.  128/84 109 HR 97% HR 237 cbg

## 2023-05-06 ENCOUNTER — Emergency Department (HOSPITAL_COMMUNITY): Payer: 59

## 2023-05-06 ENCOUNTER — Other Ambulatory Visit (HOSPITAL_COMMUNITY): Payer: Self-pay

## 2023-05-06 DIAGNOSIS — R4182 Altered mental status, unspecified: Secondary | ICD-10-CM | POA: Diagnosis not present

## 2023-05-06 LAB — URINALYSIS, W/ REFLEX TO CULTURE (INFECTION SUSPECTED)
Bilirubin Urine: NEGATIVE
Glucose, UA: NEGATIVE mg/dL
Hgb urine dipstick: NEGATIVE
Ketones, ur: NEGATIVE mg/dL
Leukocytes,Ua: NEGATIVE
Nitrite: NEGATIVE
Protein, ur: NEGATIVE mg/dL
Specific Gravity, Urine: 1.023 (ref 1.005–1.030)
pH: 6 (ref 5.0–8.0)

## 2023-05-06 LAB — CBC WITH DIFFERENTIAL/PLATELET
Abs Immature Granulocytes: 0.03 10*3/uL (ref 0.00–0.07)
Basophils Absolute: 0 10*3/uL (ref 0.0–0.1)
Basophils Relative: 0 %
Eosinophils Absolute: 0.2 10*3/uL (ref 0.0–0.5)
Eosinophils Relative: 2 %
HCT: 36.8 % (ref 36.0–46.0)
Hemoglobin: 12.1 g/dL (ref 12.0–15.0)
Immature Granulocytes: 0 %
Lymphocytes Relative: 11 %
Lymphs Abs: 1.4 10*3/uL (ref 0.7–4.0)
MCH: 30.7 pg (ref 26.0–34.0)
MCHC: 32.9 g/dL (ref 30.0–36.0)
MCV: 93.4 fL (ref 80.0–100.0)
Monocytes Absolute: 1 10*3/uL (ref 0.1–1.0)
Monocytes Relative: 8 %
Neutro Abs: 9.3 10*3/uL — ABNORMAL HIGH (ref 1.7–7.7)
Neutrophils Relative %: 79 %
Platelets: 417 10*3/uL — ABNORMAL HIGH (ref 150–400)
RBC: 3.94 MIL/uL (ref 3.87–5.11)
RDW: 13.9 % (ref 11.5–15.5)
WBC: 12 10*3/uL — ABNORMAL HIGH (ref 4.0–10.5)
nRBC: 0 % (ref 0.0–0.2)

## 2023-05-06 LAB — HCG, QUANTITATIVE, PREGNANCY: hCG, Beta Chain, Quant, S: 1 m[IU]/mL (ref <5)

## 2023-05-06 LAB — COMPREHENSIVE METABOLIC PANEL
ALT: 83 U/L — ABNORMAL HIGH (ref 0–44)
AST: 57 U/L — ABNORMAL HIGH (ref 15–41)
Albumin: 3.7 g/dL (ref 3.5–5.0)
Alkaline Phosphatase: 61 U/L (ref 38–126)
Anion gap: 7 (ref 5–15)
BUN: 12 mg/dL (ref 6–20)
CO2: 23 mmol/L (ref 22–32)
Calcium: 8.7 mg/dL — ABNORMAL LOW (ref 8.9–10.3)
Chloride: 103 mmol/L (ref 98–111)
Creatinine, Ser: 0.83 mg/dL (ref 0.44–1.00)
GFR, Estimated: >60 mL/min (ref >60)
Glucose, Bld: 119 mg/dL — ABNORMAL HIGH (ref 70–99)
Potassium: 3.6 mmol/L (ref 3.5–5.1)
Sodium: 133 mmol/L — ABNORMAL LOW (ref 135–145)
Total Bilirubin: 0.7 mg/dL (ref 0.3–1.2)
Total Protein: 7.9 g/dL (ref 6.5–8.1)

## 2023-05-06 LAB — RAPID URINE DRUG SCREEN, HOSP PERFORMED
Amphetamines: POSITIVE — AB
Barbiturates: NOT DETECTED
Benzodiazepines: POSITIVE — AB
Cocaine: NOT DETECTED
Opiates: NOT DETECTED
Tetrahydrocannabinol: POSITIVE — AB

## 2023-05-06 LAB — SEDIMENTATION RATE: Sed Rate: 29 mm/hr — ABNORMAL HIGH (ref 0–22)

## 2023-05-06 LAB — ETHANOL: Alcohol, Ethyl (B): <10 mg/dL (ref <10)

## 2023-05-06 LAB — C-REACTIVE PROTEIN: CRP: 1.5 mg/dL — ABNORMAL HIGH (ref <1.0)

## 2023-05-06 MED ORDER — DEXAMETHASONE SODIUM PHOSPHATE 10 MG/ML IJ SOLN
10.0000 mg | Freq: Once | INTRAMUSCULAR | Status: AC
  Administered 2023-05-06: 10 mg via INTRAVENOUS
  Filled 2023-05-06: qty 1

## 2023-05-06 MED ORDER — SODIUM CHLORIDE 0.9 % IV BOLUS
1000.0000 mL | Freq: Once | INTRAVENOUS | Status: AC
  Administered 2023-05-06: 1000 mL via INTRAVENOUS

## 2023-05-06 MED ORDER — DEXTROSE 5 % IV SOLN
1500.0000 mg | Freq: Once | INTRAVENOUS | Status: AC
  Administered 2023-05-06: 1500 mg via INTRAVENOUS
  Filled 2023-05-06: qty 75

## 2023-05-06 NOTE — ED Notes (Signed)
[facility] called for pt to be transported to [facility] at [address] [place], [place] [address].

## 2023-05-06 NOTE — ED Notes (Signed)
Pt assisted to shower by RN and NT.

## 2023-05-06 NOTE — Progress Notes (Signed)
Pharmacy Note: dalbavancin ([facility]) for Acute Bacterial Skin and Skin Structure Infection (ABSSSI) Patients to [facility] Discharge   Taylor Wiggins is a 42 y.o. female who presented to [facility] on 05/05/2023 with an ABSSSI.  Inclusion criteria:  Indication: Cellulitis  Patient has at least one SIRS criteria present: WBC > 12,000 or < 4,000, RR<20   Exclusion criteria: Patient was evaluated and negative for hardware involvement, hypotension / shock, elevated lactate > 2 without other explanation, gram-negative infection risk factors (bites, water exposure, infection after trauma, infection after skin graft, burns, severe immunocompromise), necrotizing fasciitis possible / confirmed, known or suspected osteomyelitis or septic arthritis, endocarditis, diabetic foot infection, ischemic ulcers, post-operative wound infection, perirectal infection, need for drainage in the OR, hand / facial infections, injection drug users with  fever, bacteremia, pregnancy or breastfeeding, allergy related to antibiotics like vancomycin, known liver disease ( T.Bili > 2x ULN or AST/ALT > 3x ULN).  Junita Push PharmD 05/06/2023, 1:41 AM Clinical Pharmacist

## 2023-05-06 NOTE — ED Notes (Signed)
Spoke with safe transport- the new ETA is 1400.

## 2023-05-06 NOTE — ED Notes (Signed)
Pt is currently exhausting all efforts to find a ride. Pt states that she is unable to find someone currently. RN helping to call all pt contacts and no one is willing to pick up pt or call her an [place]. Pt continues to be sleepy and sleep between making phone calls. A+Ox4 when awake, no complaints noted. Charge RN aware.

## 2023-05-06 NOTE — Discharge Instructions (Addendum)
You have been having an antibiotic which should effectively treat both your sore throat and your leg infection.  You should not need any additional antibiotics.  Please make sure to drink plenty of fluids.  You may take ibuprofen and/or acetaminophen as needed for pain.  Return to the emergency department if you have new or concerning symptoms.

## 2023-05-06 NOTE — ED Provider Notes (Signed)
[place] EMERGENCY DEPARTMENT AT [facility] Provider Note   CSN: [id number] Arrival date & time: 05/05/23  2231     History  Chief Complaint  Patient presents with   Cellulitis    Taylor Wiggins is a 42 y.o. female.  The history is provided by the EMS personnel and medical records. The history is limited by the condition of the patient (Altered mental status).  She has history of asthma, opioid abuse, posttraumatic stress disorder and was brought in by ambulance because of cellulitis in her legs.  Patient is not giving me any history, history is obtained solely from the EMS report and hospital records.  She apparently had been hospitalized recently with cellulitis and discharged with a prescription for an antibiotic which she did not fill.  She returned to the emergency department and was given another antibiotic prescription which she apparently also has not filled.   Home Medications Prior to Admission medications   Medication Sig Start Date End Date Taking? Authorizing Provider  Ascorbic Acid (VITAMIN C PO) Take 1 tablet by mouth daily with breakfast.    [provider]  aspirin EC 325 MG tablet Take 325 mg by mouth daily as needed for mild pain (or headaches).    [provider]  clonazePAM (KLONOPIN) 1 MG tablet Take 1 mg by mouth See admin instructions. Take 1 mg by mouth at bedtime and an additional 1 mg once a day as needed for anxiety    [provider]  Cyanocobalamin (VITAMIN B-12 PO) Take 1 tablet by mouth daily with breakfast.    [provider]  Omega-3 Fatty Acids (OMEGA-3 FISH OIL PO) Take 1 capsule by mouth daily with breakfast.    [provider]  sulfamethoxazole-trimethoprim (BACTRIM DS) 800-160 MG tablet Take 1 tablet by mouth 2 (two) times daily. 04/29/23   Orvil Feil, PA-C      Allergies    Mold extract [trichophyton]    Review of Systems   Review of Systems  Unable to perform ROS: Mental status  change    Physical Exam Updated Vital Signs BP 126/87   Pulse (!) 110   Temp 98.8 F (37.1 C) (Oral)   Resp 14   SpO2 100%  Physical Exam Vitals and nursing note reviewed.   42 year old female, resting comfortably and in no acute distress. Vital signs are significant for mildly elevated heart rate. Oxygen saturation is 100%, which is normal.  She is sleepy but arousable but will not answer questions or follow commands. Head is normocephalic and atraumatic. PERRLA, EOMI. Oropharynx is clear. Neck is nontender and supple without adenopathy or JVD. Back is nontender and there is no CVA tenderness. Lungs are clear without rales, wheezes, or rhonchi. Chest is nontender. Heart has regular rate and rhythm without murmur. Abdomen is soft, flat, nontender. Extremities: There is mild erythema over the feet and lower legs bilaterally with minimal warmth.  This could represent a low-grade cellulitis versus mild venous stasis. Skin is warm and dry without rash. Neurologic: Sleepy but arousable, nonverbal, moves all extremities equally.  ED Results / Procedures / Treatments   Labs (all labs ordered are listed, but only abnormal results are displayed) Labs Reviewed  COMPREHENSIVE METABOLIC PANEL - Abnormal; Notable for the following components:      Result Value   Sodium 133 (*)    Glucose, Bld 119 (*)    Calcium 8.7 (*)    AST 57 (*)    ALT  83 (*)    All other components within normal limits  CBC WITH DIFFERENTIAL/PLATELET - Abnormal; Notable for the following components:   WBC 12.0 (*)    Platelets 417 (*)    Neutro Abs 9.3 (*)    All other components within normal limits  SEDIMENTATION RATE - Abnormal; Notable for the following components:   Sed Rate 29 (*)    All other components within normal limits  URINALYSIS, W/ REFLEX TO CULTURE (INFECTION SUSPECTED) - Abnormal; Notable for the following components:   APPearance HAZY (*)    Bacteria, UA RARE (*)    All other components  within normal limits  RAPID URINE DRUG SCREEN, HOSP PERFORMED - Abnormal; Notable for the following components:   Benzodiazepines POSITIVE (*)    Amphetamines POSITIVE (*)    Tetrahydrocannabinol POSITIVE (*)    All other components within normal limits  ETHANOL  HCG, QUANTITATIVE, PREGNANCY  C-REACTIVE PROTEIN   Radiology CT Head Wo Contrast  Result Date: 05/06/2023 CLINICAL DATA:  Altered mental status EXAM: CT HEAD WITHOUT CONTRAST TECHNIQUE: Contiguous axial images were obtained from the base of the skull through the vertex without intravenous contrast. RADIATION DOSE REDUCTION: This exam was performed according to the departmental dose-optimization program which includes automated exposure control, adjustment of the mA and/or kV according to patient size and/or use of iterative reconstruction technique. COMPARISON:  05/04/2010 FINDINGS: Brain: There is no mass, hemorrhage or extra-axial collection. The size and configuration of the ventricles and extra-axial CSF spaces are normal. The brain parenchyma is normal, without acute or chronic infarction. Vascular: No abnormal hyperdensity of the major intracranial arteries or dural venous sinuses. No intracranial atherosclerosis. Skull: The visualized skull base, calvarium and extracranial soft tissues are normal. Sinuses/Orbits: No fluid levels or advanced mucosal thickening of the visualized paranasal sinuses. No mastoid or middle ear effusion. The orbits are normal. IMPRESSION: Normal head CT. Electronically Signed   By: Deatra Robinson M.D.   On: 05/06/2023 01:27    Procedures Procedures    Medications Ordered in ED Medications  dexamethasone (DECADRON) injection 10 mg (has no administration in time range)  dalbavancin (DALVANCE) 1,500 mg in dextrose 5 % 500 mL IVPB (0 mg Intravenous Stopped 05/06/23 0631)  sodium chloride 0.9 % bolus 1,000 mL (1,000 mLs Intravenous Bolus 05/06/23 0200)    ED Course/ Medical Decision Making/ A&P                              Medical Decision Making Amount and/or Complexity of Data Reviewed Labs: ordered. Radiology: ordered.  Risk Prescription drug management.   Erythema of the lower legs and feet which could represent a low-grade cellulitis versus mild venous stasis.  I have reviewed her past records and note hospitalization on 04/24/2023 for cellulitis, discharged on 04/26/2023 with prescription for Augmentin which was not filled.  ED visit on 04/29/2023 for foot swelling which was felt to be possible cellulitis and discharged with prescription for trimethoprim-sulfamethoxazole which also was not filled.  Based on what is recorded, her current exam seems similar.  Because of her history of noncompliance, I feel it is appropriate to treat her with dalbavancin where compliance would not be an issue, and I have ordered that.  Also, she has altered mental status and I have initiated workup for that including CT of head, CBC, comprehensive metabolic panel, ethanol level, drug screen, urinalysis.  I have reviewed and interpreted her laboratory tests and my  interpretation is drug screen positive for benzodiazepines, amphetamines, and THC which may account for her altered mental status.  Urinalysis shows no signs of infection.  Metabolic panel shows mild hyponatremia which is not clinically significant, elevated random glucose level which does not explain her symptoms, mild elevation of transaminases which is decreased from 04/24/2023, normal lactic acid level, sedimentation rate only minimally elevated at 29 making significant infection unlikely, mild leukocytosis which is nonspecific, mild thrombocytosis which is likely reactive.  After observing the patient for several hours, I have gone back to reevaluate her and she is now awake and alert and oriented.  She is complaining of a sore throat no ordered a dose of dexamethasone.  Dalbavancin should be appropriate for streptococcal infection so I have not ordered a strep  PCR.  I have been encouraging her to drink plenty of fluids, use over-the-counter NSAIDs and acetaminophen as needed for pain.  Return precautions discussed.  Final Clinical Impression(s) / ED Diagnoses Final diagnoses:  Cellulitis of lower extremity, unspecified laterality  Sore throat  Altered mental status, unspecified altered mental status type  Hyponatremia  Elevated transaminase level  Polysubstance abuse [facility])    Rx / DC Orders ED Discharge Orders     None         Dione Booze, MD 05/06/23 0725

## 2023-05-18 ENCOUNTER — Other Ambulatory Visit: Payer: Self-pay

## 2023-05-18 ENCOUNTER — Emergency Department: Payer: 59

## 2023-05-18 ENCOUNTER — Emergency Department
Admission: EM | Admit: 2023-05-18 | Discharge: 2023-05-18 | Disposition: A | Discharge status: Home / Self Care | Payer: 59 | Attending: Emergency Medicine | Admitting: Emergency Medicine

## 2023-05-18 DIAGNOSIS — Z743 Need for continuous supervision: Secondary | ICD-10-CM | POA: Diagnosis not present

## 2023-05-18 DIAGNOSIS — R110 Nausea: Secondary | ICD-10-CM | POA: Diagnosis not present

## 2023-05-18 DIAGNOSIS — R519 Headache, unspecified: Secondary | ICD-10-CM | POA: Diagnosis not present

## 2023-05-18 DIAGNOSIS — G4489 Other headache syndrome: Secondary | ICD-10-CM | POA: Diagnosis not present

## 2023-05-18 DIAGNOSIS — R0902 Hypoxemia: Secondary | ICD-10-CM | POA: Diagnosis not present

## 2023-05-18 LAB — CBC WITH DIFFERENTIAL/PLATELET
Abs Immature Granulocytes: 0.02 K/uL (ref 0.00–0.07)
Basophils Absolute: 0.1 K/uL (ref 0.0–0.1)
Basophils Relative: 1 %
Eosinophils Absolute: 0.3 K/uL (ref 0.0–0.5)
Eosinophils Relative: 4 %
HCT: 41.8 % (ref 36.0–46.0)
Hemoglobin: 13.6 g/dL (ref 12.0–15.0)
Immature Granulocytes: 0 %
Lymphocytes Relative: 31 %
Lymphs Abs: 2.5 K/uL (ref 0.7–4.0)
MCH: 30 pg (ref 26.0–34.0)
MCHC: 32.5 g/dL (ref 30.0–36.0)
MCV: 92.3 fL (ref 80.0–100.0)
Monocytes Absolute: 0.6 K/uL (ref 0.1–1.0)
Monocytes Relative: 7 %
Neutro Abs: 4.7 K/uL (ref 1.7–7.7)
Neutrophils Relative %: 57 %
Platelets: 548 K/uL — ABNORMAL HIGH (ref 150–400)
RBC: 4.53 MIL/uL (ref 3.87–5.11)
RDW: 13.4 % (ref 11.5–15.5)
WBC: 8.2 K/uL (ref 4.0–10.5)
nRBC: 0 % (ref 0.0–0.2)

## 2023-05-18 LAB — COMPREHENSIVE METABOLIC PANEL WITH GFR
ALT: 30 U/L (ref 0–44)
AST: 24 U/L (ref 15–41)
Albumin: 4.2 g/dL (ref 3.5–5.0)
Alkaline Phosphatase: 54 U/L (ref 38–126)
Anion gap: 7 (ref 5–15)
BUN: 17 mg/dL (ref 6–20)
CO2: 24 mmol/L (ref 22–32)
Calcium: 9.4 mg/dL (ref 8.9–10.3)
Chloride: 108 mmol/L (ref 98–111)
Creatinine, Ser: 1 mg/dL (ref 0.44–1.00)
GFR, Estimated: >60 mL/min
Glucose, Bld: 99 mg/dL (ref 70–99)
Potassium: 4.6 mmol/L (ref 3.5–5.1)
Sodium: 139 mmol/L (ref 135–145)
Total Bilirubin: 0.7 mg/dL (ref 0.3–1.2)
Total Protein: 8.3 g/dL — ABNORMAL HIGH (ref 6.5–8.1)

## 2023-05-18 MED ORDER — BUTALBITAL-APAP-CAFFEINE 50-325-40 MG PO TABS
1.0000 | ORAL_TABLET | Freq: Once | ORAL | Status: AC
  Administered 2023-05-18: 1 via ORAL
  Filled 2023-05-18: qty 1

## 2023-05-18 NOTE — ED Provider Notes (Signed)
[facility] Provider Note    Event Date/Time   First MD Initiated Contact with Patient 05/18/23 [phone]     (approximate)   History   Headache   HPI  Taylor Wiggins is a 42 y.o. female  who presents to the emergency department today because of concern for headache.  Headache has been present for 8 days.  It waxes and wanes.  Is located behind her eyes.  Has been accompanied by some nausea.  Denies history of migraines.  Denies any weakness or numbness to her arms or legs.      Physical Exam   Triage Vital Signs: ED Triage Vitals  Enc Vitals Group     BP 05/18/23 0540 (!) 118/91     Pulse Rate 05/18/23 0540 (!) 105     Resp 05/18/23 0540 18     Temp 05/18/23 0540 98.2 F (36.8 C)     Temp Source 05/18/23 0540 Oral     SpO2 05/18/23 0540 97 %     Weight 05/18/23 0807 154 lb 1.6 oz (69.9 kg)     Height 05/18/23 0807 5\' 8"  (1.727 m)     Head Circumference --      Peak Flow --      Pain Score --      Pain Loc --      Pain Edu? --      Excl. in GC? --     Most recent vital signs: Vitals:   05/18/23 0540  BP: (!) 118/91  Pulse: (!) 105  Resp: 18  Temp: 98.2 F (36.8 C)  SpO2: 97%   General: Awake, alert, oriented. CV:  Good peripheral perfusion. Regular rate and rhythm. Resp:  Normal effort. Lungs clear. Abd:  No distention.  Other:  Face symmetric. PERLL. EOMI. Strength 5/5 in all extremities. Sensation grossly intact.   ED Results / Procedures / Treatments   Labs (all labs ordered are listed, but only abnormal results are displayed) Labs Reviewed  CBC WITH DIFFERENTIAL/PLATELET - Abnormal; Notable for the following components:      Result Value   Platelets 548 (*)    All other components within normal limits  COMPREHENSIVE METABOLIC PANEL - Abnormal; Notable for the following components:   Total Protein 8.3 (*)    All other components within normal limits  URINALYSIS, ROUTINE W REFLEX MICROSCOPIC  POC URINE PREG, ED      EKG  None   RADIOLOGY I independently interpreted and visualized the CT head. My interpretation: No intracranial bleed. Radiology interpretation:  IMPRESSION:  Stable and normal noncontrast Head CT.     PROCEDURES:  Critical Care performed: No   MEDICATIONS ORDERED IN ED: Medications  butalbital-acetaminophen-caffeine (FIORICET) 50-325-40 MG per tablet 1 tablet (has no administration in time range)     IMPRESSION / MDM / ASSESSMENT AND PLAN / ED COURSE  I reviewed the triage vital signs and the nursing notes.                              Differential diagnosis includes, but is not limited to, intracranial mass, bleed, thrombosis, tension headache  Patient's presentation is most consistent with acute presentation with potential threat to life or bodily function.   Patient presented to the emergency department today because of concerns for headache that has been present for 8 days.  On exam patient without any neurologic deficits.  Head CT without concerning bleed  or mass.  Did offer to give patient IV fluids and medication however she stated she would like to be discharged earlier because she has another appointment this morning.  This time I think that is reasonable given reassuring workup.  Will give patient Fioricet and neurology follow-up information.  FINAL CLINICAL IMPRESSION(S) / ED DIAGNOSES   Final diagnoses:  Bad headache       Note:  This document was prepared using Dragon voice recognition software and may include unintentional dictation errors.    Phineas Semen, MD 05/18/23 [phone]

## 2023-05-18 NOTE — ED Notes (Signed)
ED Provider at bedside. 

## 2023-05-18 NOTE — Discharge Instructions (Signed)
Please seek medical attention for any high fevers, chest pain, shortness of breath, change in behavior, persistent vomiting, bloody stool or any other new or concerning symptoms.  

## 2023-05-18 NOTE — ED Notes (Signed)
Pt verbalized understanding of discharge instructions. Opportunity for questions provided.  

## 2023-05-18 NOTE — ED Triage Notes (Signed)
Pt presents to ER with c/o HA x8 days.  Pt states she has been treated for cellulitis recently in the hospital, and has been discharged.  Pt states she is unsure of why her HA has lasted so long.  Pt states HA is around both of her temples.  Denies hx of migraines.  Pt denies any other neurological changes, but states she has had some tinnitus.  Pt is otherwise A&O x4 and in NAD.

## 2023-05-22 ENCOUNTER — Telehealth: Payer: Self-pay

## 2023-05-22 ENCOUNTER — Ambulatory Visit: Payer: 59 | Admitting: Family

## 2023-05-22 NOTE — Telephone Encounter (Signed)
Attempted to call patient to reschedule missed appointment with office. Not able to reach her at this time. Not able to leave vm. Juanita Laster, RMA

## 2023-06-20 ENCOUNTER — Other Ambulatory Visit: Payer: Self-pay

## 2023-06-20 ENCOUNTER — Emergency Department
Admission: EM | Admit: 2023-06-20 | Discharge: 2023-06-22 | Disposition: A | Discharge status: Other Acute Inpatient Hospital | Payer: 59 | Attending: Emergency Medicine | Admitting: Emergency Medicine

## 2023-06-20 DIAGNOSIS — F1120 Opioid dependence, uncomplicated: Secondary | ICD-10-CM | POA: Diagnosis present

## 2023-06-20 DIAGNOSIS — F19250 Other psychoactive substance dependence with psychoactive substance-induced psychotic disorder with delusions: Secondary | ICD-10-CM | POA: Diagnosis not present

## 2023-06-20 DIAGNOSIS — F1020 Alcohol dependence, uncomplicated: Secondary | ICD-10-CM | POA: Diagnosis present

## 2023-06-20 DIAGNOSIS — R402411 Glasgow coma scale score 13-15, in the field [EMT or ambulance]: Secondary | ICD-10-CM | POA: Diagnosis not present

## 2023-06-20 DIAGNOSIS — F3162 Bipolar disorder, current episode mixed, moderate: Secondary | ICD-10-CM | POA: Diagnosis not present

## 2023-06-20 DIAGNOSIS — R000 Tachycardia, unspecified: Secondary | ICD-10-CM | POA: Diagnosis not present

## 2023-06-20 DIAGNOSIS — R202 Paresthesia of skin: Secondary | ICD-10-CM | POA: Diagnosis not present

## 2023-06-20 DIAGNOSIS — F419 Anxiety disorder, unspecified: Secondary | ICD-10-CM | POA: Diagnosis not present

## 2023-06-20 DIAGNOSIS — F19950 Other psychoactive substance use, unspecified with psychoactive substance-induced psychotic disorder with delusions: Secondary | ICD-10-CM | POA: Diagnosis present

## 2023-06-20 DIAGNOSIS — F1510 Other stimulant abuse, uncomplicated: Secondary | ICD-10-CM | POA: Diagnosis not present

## 2023-06-20 DIAGNOSIS — Z720 Tobacco use: Secondary | ICD-10-CM | POA: Diagnosis present

## 2023-06-20 DIAGNOSIS — F17200 Nicotine dependence, unspecified, uncomplicated: Secondary | ICD-10-CM | POA: Insufficient documentation

## 2023-06-20 DIAGNOSIS — Z5900 Homelessness unspecified: Secondary | ICD-10-CM

## 2023-06-20 DIAGNOSIS — R45851 Suicidal ideations: Secondary | ICD-10-CM

## 2023-06-20 DIAGNOSIS — F319 Bipolar disorder, unspecified: Secondary | ICD-10-CM | POA: Diagnosis not present

## 2023-06-20 DIAGNOSIS — R1111 Vomiting without nausea: Secondary | ICD-10-CM | POA: Diagnosis not present

## 2023-06-20 DIAGNOSIS — Y900 Blood alcohol level of less than 20 mg/100 ml: Secondary | ICD-10-CM | POA: Insufficient documentation

## 2023-06-20 DIAGNOSIS — F332 Major depressive disorder, recurrent severe without psychotic features: Secondary | ICD-10-CM | POA: Diagnosis present

## 2023-06-20 LAB — SALICYLATE LEVEL: Salicylate Lvl: <7 mg/dL — ABNORMAL LOW (ref 7.0–30.0)

## 2023-06-20 LAB — COMPREHENSIVE METABOLIC PANEL
ALT: 130 U/L — ABNORMAL HIGH (ref 0–44)
AST: 76 U/L — ABNORMAL HIGH (ref 15–41)
Albumin: 4.7 g/dL (ref 3.5–5.0)
Alkaline Phosphatase: 45 U/L (ref 38–126)
Anion gap: 8 (ref 5–15)
BUN: 13 mg/dL (ref 6–20)
CO2: 23 mmol/L (ref 22–32)
Calcium: 9.2 mg/dL (ref 8.9–10.3)
Chloride: 102 mmol/L (ref 98–111)
Creatinine, Ser: 0.83 mg/dL (ref 0.44–1.00)
GFR, Estimated: >60 mL/min (ref >60)
Glucose, Bld: 111 mg/dL — ABNORMAL HIGH (ref 70–99)
Potassium: 4.6 mmol/L (ref 3.5–5.1)
Sodium: 133 mmol/L — ABNORMAL LOW (ref 135–145)
Total Bilirubin: 0.9 mg/dL (ref 0.3–1.2)
Total Protein: 8.3 g/dL — ABNORMAL HIGH (ref 6.5–8.1)

## 2023-06-20 LAB — POC URINE PREG, ED: Preg Test, Ur: NEGATIVE

## 2023-06-20 LAB — ETHANOL: Alcohol, Ethyl (B): <10 mg/dL (ref <10)

## 2023-06-20 LAB — CBC
HCT: 41.6 % (ref 36.0–46.0)
Hemoglobin: 13.7 g/dL (ref 12.0–15.0)
MCH: 30.2 pg (ref 26.0–34.0)
MCHC: 32.9 g/dL (ref 30.0–36.0)
MCV: 91.8 fL (ref 80.0–100.0)
Platelets: 430 10*3/uL — ABNORMAL HIGH (ref 150–400)
RBC: 4.53 MIL/uL (ref 3.87–5.11)
RDW: 13.6 % (ref 11.5–15.5)
WBC: 7.7 10*3/uL (ref 4.0–10.5)
nRBC: 0 % (ref 0.0–0.2)

## 2023-06-20 LAB — ACETAMINOPHEN LEVEL: Acetaminophen (Tylenol), Serum: <10 ug/mL — ABNORMAL LOW (ref 10–30)
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
## 2023-06-21 DIAGNOSIS — F1510 Other stimulant abuse, uncomplicated: Secondary | ICD-10-CM

## 2023-06-21 DIAGNOSIS — F3162 Bipolar disorder, current episode mixed, moderate: Secondary | ICD-10-CM | POA: Diagnosis not present

## 2023-06-21 DIAGNOSIS — R45851 Suicidal ideations: Secondary | ICD-10-CM

## 2023-06-21 DIAGNOSIS — F332 Major depressive disorder, recurrent severe without psychotic features: Secondary | ICD-10-CM | POA: Diagnosis not present

## 2023-06-21 LAB — URINE DRUG SCREEN, QUALITATIVE (ARMC ONLY)
Amphetamines, Ur Screen: POSITIVE — AB
Barbiturates, Ur Screen: NOT DETECTED
Benzodiazepine, Ur Scrn: NOT DETECTED
Cannabinoid 50 Ng, Ur ~~LOC~~: NOT DETECTED
Cocaine Metabolite,Ur ~~LOC~~: NOT DETECTED
MDMA (Ecstasy)Ur Screen: NOT DETECTED
Methadone Scn, Ur: NOT DETECTED
Opiate, Ur Screen: NOT DETECTED
Phencyclidine (PCP) Ur S: NOT DETECTED
Tricyclic, Ur Screen: NOT DETECTED

## 2023-06-21 MED ORDER — THIAMINE MONONITRATE 100 MG PO TABS
100.0000 mg | ORAL_TABLET | Freq: Every day | ORAL | Status: DC
  Administered 2023-06-21 – 2023-06-22 (×2): 100 mg via ORAL
  Filled 2023-06-21 (×2): qty 1

## 2023-06-21 MED ORDER — ADULT MULTIVITAMIN W/MINERALS CH
1.0000 | ORAL_TABLET | Freq: Every day | ORAL | Status: DC
  Administered 2023-06-21 – 2023-06-22 (×2): 1 via ORAL
  Filled 2023-06-21 (×2): qty 1

## 2023-06-21 MED ORDER — IPRATROPIUM-ALBUTEROL 0.5-2.5 (3) MG/3ML IN SOLN
3.0000 mL | Freq: Once | RESPIRATORY_TRACT | Status: AC
  Administered 2023-06-21: 3 mL via RESPIRATORY_TRACT
  Filled 2023-06-21: qty 3

## 2023-06-21 MED ORDER — DROPERIDOL 2.5 MG/ML IJ SOLN
5.0000 mg | Freq: Once | INTRAMUSCULAR | Status: AC
  Administered 2023-06-21: 5 mg via INTRAMUSCULAR

## 2023-06-21 MED ORDER — FOLIC ACID 1 MG PO TABS
1.0000 mg | ORAL_TABLET | Freq: Every day | ORAL | Status: DC
  Administered 2023-06-21 – 2023-06-22 (×2): 1 mg via ORAL
  Filled 2023-06-21 (×2): qty 1

## 2023-06-21 MED ORDER — LORAZEPAM 1 MG PO TABS: 1.0000 mg | ORAL_TABLET | ORAL | Status: DC | PRN

## 2023-06-21 MED ORDER — THIAMINE HCL 100 MG/ML IJ SOLN: 100.0000 mg | Freq: Every day | INTRAMUSCULAR | Status: DC

## 2023-06-21 NOTE — ED Notes (Signed)
IVC/pending psych inpatient admission when medically cleared. 

## 2023-06-21 NOTE — ED Notes (Signed)
Pt reports thinks she is having an asthma attack, takes albuterol at home. Also states thinks throat is closing up from yelling and screaming. RR even and unlabored. NAD noted.
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
## 2023-06-21 NOTE — Discharge Instructions (Signed)
                  Intensive Outpatient Programs  [facility]    [facility] [address]     [address] [place],  [place]     [place], [place] [phone]      [phone]  [facility]   [facility]  (Inpatient and outpatient)  [phone] (Suboxone and Methadone) [address]           [phone]           [facility]    [facility] - Intensive Outpatient [address]     [address] [place], [place] [address]     [place], [place]  [phone]      [phone]  [facility] (Outpatient, Inpatient, Chemical  [facility] (Groups and [facility]idental) (insurance only) [phone]    [place], [place]          [phone]       [facility]    [facility] (for caregivers and family) [address]     [address] [place], [place]     [place], [place] [phone]      [phone]  Residential Treatment Programs  [facility]  [facility](2 years) Residential: 90 days)  [facility] [address]      [address] [place], [place]     [place], [place] [phone]      [phone] or [phone]  [facility]    [facility] [address]      [address] [place], [place]     [place], [place] [phone]      [phone]  [facility]   [facility] ([facility]) [address]     [address] [place], [place] [address]     [place], [place] [phone]      [phone] Admissions: 8am-3pm M-F  [facility] Program: Residential Program (90 Days)              [facility]: [facility]  [place], [place]     [place], [place]  [phone] or [phone]    (Walk in Hours over the weekend or by referral)   [facility]: [facility]:[phone] (for crisis  response 24 hours a day) 

## 2023-06-21 NOTE — BH Assessment (Addendum)
Per [facility]), patient to be referred out of system.  Referral information for Psychiatric Hospitalization faxed to;   [facility] [phone]) No available beds  [facility] [phone]- [phone]),   [facility] [phone]),  [address] [phone]),   [facility] [phone] -or- [phone]), Under review  [facility] [phone])  [facility] [phone])

## 2023-06-21 NOTE — TOC CM/SW Note (Signed)
Patient IVC'd.  TOC consult for [facility] resources. Resources added to be included on AVS for patient to review when appropriate.  Alfonso Ramus, [place] [phone]

## 2023-06-21 NOTE — ED Notes (Signed)
Took trash can around for pt to throw any trash they had in their room out.  
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
## 2023-06-21 NOTE — ED Notes (Signed)
Pt was offered snack. PT denied snack. Nurse notified. Pt was later given a dinner tray per request.

## 2023-06-21 NOTE — ED Notes (Signed)
Pt given dinner tray.

## 2023-06-21 NOTE — ED Notes (Signed)
Pt finished her dinner tray, no other needs at this time
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
## 2023-06-22 ENCOUNTER — Encounter (HOSPITAL_COMMUNITY): Payer: Self-pay | Admitting: Psychiatric/Mental Health

## 2023-06-22 ENCOUNTER — Inpatient Hospital Stay (HOSPITAL_COMMUNITY)
Admission: AD | Admit: 2023-06-22 | Discharge: 2023-06-30 | DRG: 885 | Disposition: A | Discharge status: Home / Self Care | Payer: 59 | Source: Intra-hospital | Attending: Psychiatry | Admitting: Psychiatry

## 2023-06-22 ENCOUNTER — Other Ambulatory Visit: Payer: Self-pay

## 2023-06-22 DIAGNOSIS — Z818 Family history of other mental and behavioral disorders: Secondary | ICD-10-CM

## 2023-06-22 DIAGNOSIS — F17290 Nicotine dependence, other tobacco product, uncomplicated: Secondary | ICD-10-CM | POA: Diagnosis present

## 2023-06-22 DIAGNOSIS — Z833 Family history of diabetes mellitus: Secondary | ICD-10-CM | POA: Diagnosis not present

## 2023-06-22 DIAGNOSIS — B1920 Unspecified viral hepatitis C without hepatic coma: Secondary | ICD-10-CM | POA: Diagnosis present

## 2023-06-22 DIAGNOSIS — F319 Bipolar disorder, unspecified: Secondary | ICD-10-CM | POA: Diagnosis not present

## 2023-06-22 DIAGNOSIS — Z635 Disruption of family by separation and divorce: Secondary | ICD-10-CM | POA: Diagnosis not present

## 2023-06-22 DIAGNOSIS — Z79899 Other long term (current) drug therapy: Secondary | ICD-10-CM

## 2023-06-22 DIAGNOSIS — F332 Major depressive disorder, recurrent severe without psychotic features: Secondary | ICD-10-CM

## 2023-06-22 DIAGNOSIS — F17210 Nicotine dependence, cigarettes, uncomplicated: Secondary | ICD-10-CM | POA: Diagnosis present

## 2023-06-22 DIAGNOSIS — F1193 Opioid use, unspecified with withdrawal: Secondary | ICD-10-CM | POA: Diagnosis not present

## 2023-06-22 DIAGNOSIS — Y900 Blood alcohol level of less than 20 mg/100 ml: Secondary | ICD-10-CM | POA: Diagnosis not present

## 2023-06-22 DIAGNOSIS — F1520 Other stimulant dependence, uncomplicated: Secondary | ICD-10-CM | POA: Diagnosis present

## 2023-06-22 DIAGNOSIS — F1020 Alcohol dependence, uncomplicated: Secondary | ICD-10-CM | POA: Diagnosis not present

## 2023-06-22 DIAGNOSIS — F314 Bipolar disorder, current episode depressed, severe, without psychotic features: Secondary | ICD-10-CM | POA: Diagnosis not present

## 2023-06-22 DIAGNOSIS — Z5900 Homelessness unspecified: Secondary | ICD-10-CM

## 2023-06-22 DIAGNOSIS — F909 Attention-deficit hyperactivity disorder, unspecified type: Secondary | ICD-10-CM | POA: Diagnosis present

## 2023-06-22 DIAGNOSIS — R45851 Suicidal ideations: Secondary | ICD-10-CM | POA: Diagnosis not present

## 2023-06-22 DIAGNOSIS — F1510 Other stimulant abuse, uncomplicated: Secondary | ICD-10-CM | POA: Diagnosis not present

## 2023-06-22 DIAGNOSIS — F17200 Nicotine dependence, unspecified, uncomplicated: Secondary | ICD-10-CM | POA: Diagnosis not present

## 2023-06-22 DIAGNOSIS — F419 Anxiety disorder, unspecified: Secondary | ICD-10-CM | POA: Diagnosis not present

## 2023-06-22 DIAGNOSIS — F1310 Sedative, hypnotic or anxiolytic abuse, uncomplicated: Secondary | ICD-10-CM | POA: Diagnosis present

## 2023-06-22 DIAGNOSIS — F19250 Other psychoactive substance dependence with psychoactive substance-induced psychotic disorder with delusions: Secondary | ICD-10-CM | POA: Diagnosis not present

## 2023-06-22 DIAGNOSIS — F4310 Post-traumatic stress disorder, unspecified: Secondary | ICD-10-CM | POA: Diagnosis present

## 2023-06-22 DIAGNOSIS — F1120 Opioid dependence, uncomplicated: Secondary | ICD-10-CM | POA: Diagnosis not present

## 2023-06-22 DIAGNOSIS — F1190 Opioid use, unspecified, uncomplicated: Secondary | ICD-10-CM | POA: Diagnosis present

## 2023-06-22 MED ORDER — HYDROXYZINE HCL 25 MG PO TABS
25.0000 mg | ORAL_TABLET | Freq: Three times a day (TID) | ORAL | Status: DC | PRN
  Administered 2023-06-29: 25 mg via ORAL
  Filled 2023-06-22: qty 1

## 2023-06-22 MED ORDER — QUETIAPINE FUMARATE 50 MG PO TABS
50.0000 mg | ORAL_TABLET | Freq: Every day | ORAL | Status: DC
  Administered 2023-06-22 – 2023-06-23 (×2): 50 mg via ORAL
  Filled 2023-06-22 (×6): qty 1

## 2023-06-22 MED ORDER — MAGNESIUM HYDROXIDE 400 MG/5ML PO SUSP: 30.0000 mL | Freq: Every day | ORAL | Status: DC | PRN

## 2023-06-22 MED ORDER — LORAZEPAM 1 MG PO TABS: 2.0000 mg | ORAL_TABLET | Freq: Three times a day (TID) | ORAL | Status: DC | PRN

## 2023-06-22 MED ORDER — CLONAZEPAM 0.5 MG PO TABS: 0.5000 mg | ORAL_TABLET | Freq: Two times a day (BID) | ORAL | Status: DC | PRN

## 2023-06-22 MED ORDER — HALOPERIDOL LACTATE 5 MG/ML IJ SOLN: 5.0000 mg | Freq: Three times a day (TID) | INTRAMUSCULAR | Status: DC | PRN

## 2023-06-22 MED ORDER — OLANZAPINE 10 MG PO TABS: 10.0000 mg | ORAL_TABLET | Freq: Every day | ORAL | Status: DC | PRN

## 2023-06-22 MED ORDER — HALOPERIDOL 5 MG PO TABS: 5.0000 mg | ORAL_TABLET | Freq: Three times a day (TID) | ORAL | Status: DC | PRN

## 2023-06-22 MED ORDER — ACETAMINOPHEN 325 MG PO TABS
650.0000 mg | ORAL_TABLET | Freq: Four times a day (QID) | ORAL | Status: DC | PRN
  Administered 2023-06-23 – 2023-06-29 (×3): 650 mg via ORAL
  Filled 2023-06-22 (×3): qty 2

## 2023-06-22 MED ORDER — ALUM & MAG HYDROXIDE-SIMETH 200-200-20 MG/5ML PO SUSP: 30.0000 mL | ORAL | Status: DC | PRN

## 2023-06-22 MED ORDER — ENSURE ENLIVE PO LIQD
237.0000 mL | Freq: Two times a day (BID) | ORAL | Status: DC
  Administered 2023-06-23 – 2023-06-27 (×4): 237 mL via ORAL
  Filled 2023-06-22 (×15): qty 237

## 2023-06-22 MED ORDER — QUETIAPINE FUMARATE 25 MG PO TABS: 50.0000 mg | ORAL_TABLET | Freq: Every day | ORAL | Status: DC

## 2023-06-22 MED ORDER — ALBUTEROL SULFATE HFA 108 (90 BASE) MCG/ACT IN AERS: 2.0000 | INHALATION_SPRAY | Freq: Four times a day (QID) | RESPIRATORY_TRACT | Status: DC | PRN

## 2023-06-22 MED ORDER — OLANZAPINE 10 MG IM SOLR: 10.0000 mg | Freq: Every day | INTRAMUSCULAR | Status: DC | PRN

## 2023-06-22 MED ORDER — LORAZEPAM 2 MG/ML IJ SOLN: 2.0000 mg | Freq: Three times a day (TID) | INTRAMUSCULAR | Status: DC | PRN

## 2023-06-22 MED ORDER — TRAZODONE HCL 50 MG PO TABS
50.0000 mg | ORAL_TABLET | Freq: Every evening | ORAL | Status: DC | PRN
  Filled 2023-06-22 (×2): qty 1

## 2023-06-22 MED ORDER — DIPHENHYDRAMINE HCL 25 MG PO CAPS: 50.0000 mg | ORAL_CAPSULE | Freq: Three times a day (TID) | ORAL | Status: DC | PRN

## 2023-06-22 MED ORDER — DIPHENHYDRAMINE HCL 50 MG/ML IJ SOLN: 50.0000 mg | Freq: Three times a day (TID) | INTRAMUSCULAR | Status: DC | PRN

## 2023-06-22 NOTE — ED Notes (Signed)
IVC pending placement 
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
## 2023-06-22 NOTE — ED Provider Notes (Signed)
Emergency Medicine Observation Re-evaluation Note  Taylor Wiggins is a 42 y.o. female, seen on rounds today.  Pt initially presented to the ED for complaints of Suicidal  Currently, the patient is calm, no acute complaints.  Physical Exam  Blood pressure 107/74, pulse 74, temperature 98.4 F (36.9 C), temperature source Oral, resp. rate 15, height 5\' 8"  (1.727 m), weight 69.9 kg, last menstrual period 05/20/2023, SpO2 98 %. Physical Exam General: NAD Lungs: CTAB Psych: not agitated  ED Course / MDM  EKG:    I have reviewed the labs performed to date as well as medications administered while in observation.  Recent changes in the last 24 hours include no acute events overnight.    Plan  Current plan is for psych admit. Patient is under full IVC at this time.   Sharman Cheek, MD 06/22/23 [phone]

## 2023-06-22 NOTE — ED Notes (Signed)
Pt given lunch tray and beverage 
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
## 2023-06-23 ENCOUNTER — Encounter (HOSPITAL_COMMUNITY): Payer: Self-pay

## 2023-06-23 DIAGNOSIS — F319 Bipolar disorder, unspecified: Secondary | ICD-10-CM

## 2023-06-23 MED ORDER — SERTRALINE HCL 50 MG PO TABS
50.0000 mg | ORAL_TABLET | Freq: Every day | ORAL | Status: DC
  Administered 2023-06-23 – 2023-06-24 (×2): 50 mg via ORAL
  Filled 2023-06-23 (×5): qty 1

## 2023-06-23 NOTE — Progress Notes (Signed)
Adult Psychoeducational Group Note  Date:  06/23/2023 Time:  1:06 AM  Group Topic/Focus:  Wrap-Up Group:   The focus of this group is to help patients review their daily goal of treatment and discuss progress on daily workbooks.  Participation Level:  Did Not Attend  Participation Quality:  Appropriate  Affect:  Appropriate  Cognitive:  Disorganized  Insight: None  Engagement in Group:  None  Modes of Intervention:  Discussion  Additional Comments:  Pt. Did not attend group.  Joselyn Arrow 06/23/2023, 1:06 AM

## 2023-06-23 NOTE — Group Note (Signed)
Recreation Therapy Group Note   Group Topic:Problem Solving  Group Date: 06/23/2023 Start Time: 0930 End Time: 1000 Facilitators: Brylinn Teaney-McCall, LRT,CTRS Location: [address] Day[address]   Goal Area(s) Addresses:  Patient will effectively work with peer towards shared goal.  Patient will identify skills used to make activity successful.  Patient will share challenges and verbalize solution-driven approaches used. Patient will identify how skills used during activity can be used to reach post d/c goals.   Group Description: [employer]. Patients were provided the following materials: 4 drinking straws, 5 rubber bands, 5 paper clips, 2 index cards and 2 drinking cups. Using the provided materials patients were asked to build a launching mechanism to launch a ping pong ball across the room, approximately 10 feet. Patients were divided into teams of 3-5. Instructions required all materials be incorporated into the device, functionality of items left to the peer group's discretion.   Affect/Mood: N/A   Participation Level: Did not attend    Clinical Observations/Individualized Feedback:     Plan: Continue to engage patient in RT group sessions 2-3x/week.   Kimeka Badour-McCall, LRT,CTRS 06/23/2023 12:48 PM

## 2023-06-23 NOTE — BH IP Treatment Plan (Signed)
Interdisciplinary Treatment and Diagnostic Plan Update  06/23/2023 Time of Session: 10:35 AM  RAEGAN WINDERS MRN: [id number]  Principal Diagnosis: Bipolar 1 disorder (HCC)  Secondary Diagnoses: Principal Problem:   Bipolar 1 disorder (HCC)   Current Medications:  Current Facility-Administered Medications  Medication Dose Route Frequency Provider Last Rate Last Admin   acetaminophen (TYLENOL) tablet 650 mg  650 mg Oral Q6H PRN Dixon, Rashaun M, NP       albuterol (VENTOLIN HFA) 108 (90 Base) MCG/ACT inhaler 2 puff  2 puff Inhalation Q6H PRN Ntuen, Jesusita Oka, FNP       alum & mag hydroxide-simeth (MAALOX/MYLANTA) 200-200-20 MG/5ML suspension 30 mL  30 mL Oral Q4H PRN Dixon, Rashaun M, NP       clonazePAM (KLONOPIN) tablet 0.5 mg  0.5 mg Oral BID PRN Ntuen, Jesusita Oka, FNP       diphenhydrAMINE (BENADRYL) capsule 50 mg  50 mg Oral TID PRN Jearld Lesch, NP       Or   diphenhydrAMINE (BENADRYL) injection 50 mg  50 mg Intramuscular TID PRN Dixon, Rashaun M, NP       feeding supplement (ENSURE ENLIVE / ENSURE PLUS) liquid 237 mL  237 mL Oral BID BM Ntuen, Tina C, FNP   237 mL at 06/23/23 1056   haloperidol (HALDOL) tablet 5 mg  5 mg Oral TID PRN Jearld Lesch, NP       Or   haloperidol lactate (HALDOL) injection 5 mg  5 mg Intramuscular TID PRN Jearld Lesch, NP       hydrOXYzine (ATARAX) tablet 25 mg  25 mg Oral TID PRN Jearld Lesch, NP       LORazepam (ATIVAN) tablet 2 mg  2 mg Oral TID PRN Jearld Lesch, NP       Or   LORazepam (ATIVAN) injection 2 mg  2 mg Intramuscular TID PRN Dixon, Rashaun M, NP       magnesium hydroxide (MILK OF MAGNESIA) suspension 30 mL  30 mL Oral Daily PRN Durwin Nora, Rashaun M, NP       OLANZapine (ZYPREXA) tablet 10 mg  10 mg Oral Daily PRN Charm Rings, NP       Or   OLANZapine (ZYPREXA) injection 10 mg  10 mg Intramuscular Daily PRN Charm Rings, NP       QUEtiapine (SEROQUEL) tablet 50 mg  50 mg Oral QHS Charm Rings, NP   50 mg at 06/22/23  2136   sertraline (ZOLOFT) tablet 50 mg  50 mg Oral Daily Rex Kras, MD       traZODone (DESYREL) tablet 50 mg  50 mg Oral QHS PRN Jearld Lesch, NP       PTA Medications: Medications Prior to Admission  Medication Sig Dispense Refill Last Dose   Ascorbic Acid (VITAMIN C PO) Take 1 tablet by mouth daily with breakfast. (Patient not taking: Reported on 06/21/2023)      aspirin EC 325 MG tablet Take 325 mg by mouth daily as needed for mild pain (or headaches).      clonazePAM (KLONOPIN) 1 MG tablet Take 1 mg by mouth See admin instructions. Take 1 mg by mouth at bedtime and an additional 1 mg once a day as needed for anxiety (Patient not taking: Reported on 06/21/2023)      Cyanocobalamin (VITAMIN B-12 PO) Take 1 tablet by mouth daily with breakfast. (Patient not taking: Reported on 06/21/2023)      Omega-3 Fatty  Acids (OMEGA-3 FISH OIL PO) Take 1 capsule by mouth daily with breakfast. (Patient not taking: Reported on 06/21/2023)       Patient Stressors: Marital or family conflict   Substance abuse    Patient Strengths: Average or above average intelligence  Capable of independent living  Motivation for treatment/growth  Physical Health  Supportive family/friends   Treatment Modalities: Medication Management, Group therapy, Case management,  1 to 1 session with clinician, Psychoeducation, Recreational therapy.   Physician Treatment Plan for Primary Diagnosis: Bipolar 1 disorder (HCC) Long Term Goal(s): Improvement in symptoms so as ready for discharge   Short Term Goals: Ability to disclose and discuss suicidal ideas Ability to maintain clinical measurements within normal limits will improve Ability to identify changes in lifestyle to reduce recurrence of condition will improve Ability to verbalize feelings will improve Ability to identify and develop effective coping behaviors will improve  Medication Management: Evaluate patient's response, side effects, and tolerance of  medication regimen.  Therapeutic Interventions: 1 to 1 sessions, Unit Group sessions and Medication administration.  Evaluation of Outcomes: Not Progressing  Physician Treatment Plan for Secondary Diagnosis: Principal Problem:   Bipolar 1 disorder (HCC)  Long Term Goal(s): Improvement in symptoms so as ready for discharge   Short Term Goals: Ability to disclose and discuss suicidal ideas Ability to maintain clinical measurements within normal limits will improve Ability to identify changes in lifestyle to reduce recurrence of condition will improve Ability to verbalize feelings will improve Ability to identify and develop effective coping behaviors will improve     Medication Management: Evaluate patient's response, side effects, and tolerance of medication regimen.  Therapeutic Interventions: 1 to 1 sessions, Unit Group sessions and Medication administration.  Evaluation of Outcomes: Not Progressing   RN Treatment Plan for Primary Diagnosis: Bipolar 1 disorder (HCC) Long Term Goal(s): Knowledge of disease and therapeutic regimen to maintain health will improve  Short Term Goals: Ability to remain free from injury will improve, Ability to verbalize frustration and anger appropriately will improve, Ability to demonstrate self-control, Ability to participate in decision making will improve, Ability to verbalize feelings will improve, Ability to disclose and discuss suicidal ideas, Ability to identify and develop effective coping behaviors will improve, and Compliance with prescribed medications will improve  Medication Management: RN will administer medications as ordered by provider, will assess and evaluate patient's response and provide education to patient for prescribed medication. RN will report any adverse and/or side effects to prescribing provider.  Therapeutic Interventions: 1 on 1 counseling sessions, Psychoeducation, Medication administration, Evaluate responses to treatment,  Monitor vital signs and CBGs as ordered, Perform/monitor CIWA, COWS, AIMS and Fall Risk screenings as ordered, Perform wound care treatments as ordered.  Evaluation of Outcomes: Not Progressing   LCSW Treatment Plan for Primary Diagnosis: Bipolar 1 disorder (HCC) Long Term Goal(s): Safe transition to appropriate next level of care at discharge, Engage patient in therapeutic group addressing interpersonal concerns.  Short Term Goals: Engage patient in aftercare planning with referrals and resources, Increase social support, Increase ability to appropriately verbalize feelings, Increase emotional regulation, Facilitate acceptance of mental health diagnosis and concerns, Facilitate patient progression through stages of change regarding substance use diagnoses and concerns, Identify triggers associated with mental health/substance abuse issues, and Increase skills for wellness and recovery  Therapeutic Interventions: Assess for all discharge needs, 1 to 1 time with Social worker, Explore available resources and support systems, Assess for adequacy in community support network, Educate family and significant other(s) on suicide prevention,  Complete Psychosocial Assessment, Interpersonal group therapy.  Evaluation of Outcomes: Not Progressing   Progress in Treatment: Attending groups: No. Participating in groups: No. Taking medication as prescribed: Yes. Toleration medication: Yes. Family/Significant other contact made: No, will contact:  Whoever, pt gives CSW permission to speak with  Patient understands diagnosis: Yes. Discussing patient identified problems/goals with staff: Yes. Medical problems stabilized or resolved: Yes. Denies suicidal/homicidal ideation: Yes. Issues/concerns per patient self-inventory: No.   New problem(s) identified: No, Describe:  None reported   New Short Term/Long Term Goal(s):detox, medication management for mood stabilization; elimination of SI thoughts;  development of comprehensive mental wellness/sobriety plan   Patient Goals:  " get an antidepressant and Drug/alcohol Treatment "   Discharge Plan or Barriers: Patient recently admitted. CSW will continue to follow and assess for appropriate referrals and possible discharge planning.    Reason for Continuation of Hospitalization: Anxiety Depression Medication stabilization Suicidal ideation Other; describe Argument with husband   Estimated Length of Stay: 3-5 days   Last 3 [place] Suicide Severity Risk Score: Flowsheet Row Ad[facility] (Current) [facility] in [facility] ED from [facility]/18/2023 in [facility]  C-SSRS RISK CATEGORY Moderate Risk High Risk No Risk       Last PHQ 2/9 Scores:    12/05/2022    2:54 PM 07/19/2022    8:06 PM 07/06/2022    9:49 AM  Depression screen PHQ 2/9  Decreased Interest 0 2 2  Down, Depressed, Hopeless 0 3 1  PHQ - 2 Score 0 5 3  Altered sleeping  2 2  Tired, decreased energy  2 2  Change in appetite  1 1  Feeling bad or failure about yourself   3 2  Trouble concentrating  2 1  Moving slowly or fidgety/restless  0 0  Suicidal thoughts  1 1  PHQ-9 Score  16 12  Difficult doing work/chores  Extremely dIfficult Somewhat difficult    Scribe for Treatment Team: Beather Arbour 06/23/2023 2:33 PM

## 2023-06-23 NOTE — Progress Notes (Signed)
   06/23/23 2237  Psych Admission Type (Psych Patients Only)  Admission Status Involuntary  Psychosocial Assessment  Patient Complaints Anxiety  Eye Contact Fair  Facial Expression Flat  Affect Depressed  Speech Logical/coherent  Interaction Assertive  Motor Activity Other (Comment) (WDL)  Appearance/Hygiene Improved  Behavior Characteristics Cooperative  Mood Depressed  Thought Process  Coherency WDL  Content WDL  Delusions None reported or observed  Perception WDL  Hallucination None reported or observed  Judgment Impaired  Confusion None  Danger to Self  Current suicidal ideation? Denies  Self-Injurious Behavior No self-injurious ideation or behavior indicators observed or expressed   Agreement Not to Harm Self Yes  Description of Agreement verbal contract for safety  Danger to Others  Danger to Others None reported or observed   D: Patient in room on approach. Pt denies SI/HI and AVH.  A: Medications administered as prescribed. Support and encouragement provided as needed.  R: Patient remains safe on the unit. Plan of care ongoing for safety and stability.

## 2023-06-23 NOTE — Progress Notes (Signed)
   06/23/23 0602  15 Minute Checks  Location Bedroom  Visual Appearance Calm  Behavior Sleeping  Sleep (Behavioral Health Patients Only)  Calculate sleep? (Click Yes once per 24 hr at 0600 safety check) Yes  Documented sleep last 24 hours 8.5
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
## 2023-06-23 NOTE — Progress Notes (Signed)
   06/23/23 1110  Psych Admission Type (Psych Patients Only)  Admission Status Involuntary  Psychosocial Assessment  Patient Complaints Anxiety  Eye Contact Fair  Facial Expression Flat  Affect Depressed  Speech Logical/coherent  Interaction Assertive  Motor Activity Fidgety;Rigidity  Appearance/Hygiene In scrubs  Behavior Characteristics Cooperative;Appropriate to situation;Anxious  Mood Depressed;Anxious  Thought Process  Coherency WDL  Content WDL  Delusions None reported or observed  Perception WDL  Hallucination None reported or observed  Judgment Impaired  Confusion WDL  Danger to Self  Current suicidal ideation? Passive  Self-Injurious Behavior No self-injurious ideation or behavior indicators observed or expressed   Agreement Not to Harm Self Yes  Description of Agreement verbal  Danger to Others  Danger to Others None reported or observed
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
## 2023-06-23 NOTE — [facility] Counselor (Signed)
[facility] LCSW Progress Note   06/23/2023    12:25 PM  Taylor Wiggins      Type of Note: Residential Options    CSW provided patient with [facility] and [facility] contact information since she mentioned her interest in them during her treatment team meeting. CSW did explained the process for both, and patient said that she will call [facility].  CSW will send referral to [facility] once documentation from doctor (H&P) is in system.     Signed:   Jacob Moores, MSW, [facility] 06/23/2023 12:25 PM

## 2023-06-23 NOTE — Progress Notes (Signed)
   06/22/23 2135  Psych Admission Type (Psych Patients Only)  Admission Status Involuntary  Psychosocial Assessment  Patient Complaints Anxiety;Depression  Eye Contact Fair  Facial Expression Flat  Affect Depressed  Speech Logical/coherent  Interaction Assertive  Motor Activity Other (Comment) (WNL)  Appearance/Hygiene In scrubs  Behavior Characteristics Cooperative;Appropriate to situation  Mood Depressed  Thought Process  Coherency WDL  Content WDL  Delusions None reported or observed  Perception WDL  Hallucination None reported or observed  Judgment Impaired  Confusion None  Danger to Self  Current suicidal ideation? Passive  Description of Suicide Plan No plan  Self-Injurious Behavior No self-injurious ideation or behavior indicators observed or expressed   Agreement Not to Harm Self Yes  Description of Agreement Verbal  Danger to Others  Danger to Others None reported or observed   Pt reports 7/10 anxiety and 9/10 depression. Pt was offered support and encouragement. Pt was given scheduled medications. Pt was encouraged to attend group. Q 15 minute checks were done for safety. Pt did not attend group, remained in room asleep. Minimally interactive with peers and staff. Pt has no complaints.Pt receptive to treatment and safety maintained on unit.

## 2023-06-24 DIAGNOSIS — F1310 Sedative, hypnotic or anxiolytic abuse, uncomplicated: Secondary | ICD-10-CM | POA: Diagnosis present

## 2023-06-24 DIAGNOSIS — F314 Bipolar disorder, current episode depressed, severe, without psychotic features: Secondary | ICD-10-CM

## 2023-06-24 MED ORDER — ADULT MULTIVITAMIN W/MINERALS CH
1.0000 | ORAL_TABLET | Freq: Every day | ORAL | Status: DC
  Administered 2023-06-24 – 2023-06-29 (×6): 1 via ORAL
  Filled 2023-06-24 (×10): qty 1

## 2023-06-24 MED ORDER — SERTRALINE HCL 100 MG PO TABS
100.0000 mg | ORAL_TABLET | Freq: Every day | ORAL | Status: DC
  Administered 2023-06-25 – 2023-06-29 (×5): 100 mg via ORAL
  Filled 2023-06-24 (×8): qty 1

## 2023-06-24 MED ORDER — LOPERAMIDE HCL 2 MG PO CAPS: 2.0000 mg | ORAL_CAPSULE | ORAL | Status: DC | PRN

## 2023-06-24 MED ORDER — ONDANSETRON 4 MG PO TBDP: 4.0000 mg | ORAL_TABLET | Freq: Four times a day (QID) | ORAL | Status: AC | PRN

## 2023-06-24 MED ORDER — LORAZEPAM 1 MG PO TABS
1.0000 mg | ORAL_TABLET | Freq: Four times a day (QID) | ORAL | Status: DC | PRN
Start: 2023-06-24 — End: 2023-06-24

## 2023-06-24 MED ORDER — VITAMIN B-1 100 MG PO TABS
100.0000 mg | ORAL_TABLET | Freq: Every day | ORAL | Status: DC
Start: 2023-06-25 — End: 2023-06-24

## 2023-06-24 MED ORDER — QUETIAPINE FUMARATE 100 MG PO TABS
100.0000 mg | ORAL_TABLET | Freq: Every day | ORAL | Status: DC
  Administered 2023-06-24 – 2023-06-29 (×6): 100 mg via ORAL
  Filled 2023-06-24 (×9): qty 1

## 2023-06-24 MED ORDER — THIAMINE HCL 100 MG/ML IJ SOLN
100.0000 mg | Freq: Once | INTRAMUSCULAR | Status: DC
Start: 2023-06-24 — End: 2023-06-24

## 2023-06-24 NOTE — Group Note (Signed)
Date:  06/24/2023 Time:  12:19 PM  Group Topic/Focus:  Coping With Mental Health Crisis:   The purpose of this group is to help patients identify strategies for coping with mental health crisis.  Group discusses possible causes of crisis and ways to manage them effectively. Early Warning Signs:   The focus of this group is to help patients identify signs or symptoms they exhibit before slipping into an unhealthy state or crisis.    Participation Level:  Did Not Attend  Participation Quality:    Affect:    Cognitive:    Insight:   Engagement in Group:    Modes of Intervention:    Additional Comments:    Memory Dance Aspasia Rude 06/24/2023, 12:19 PM

## 2023-06-24 NOTE — Progress Notes (Addendum)
D:  Patient's self inventory sheet, patient has fair sleep, no sleep medication.  Fair appetite, low energy level, good concentration.  Rated depression 5, hopeless 5, anxiety 4.  Denied withdrawals.  Denied SI.  Denied physical problems.  Denied physical pain.    Goal is attend groups.  No discharge plans.  Staff is working on discharge placement.   A:  Medications administered per MD orders.  Emotional support and encouragement given patient. R:  Denied SI and HI.  Denied A/V hallucinations.  Safety maintained with 15 minute checks.

## 2023-06-24 NOTE — [facility] Suicide Risk Assessment (Signed)
[facility] INPATIENT:  Family/Significant Other Suicide Prevention Education  Suicide Prevention Education:  Patient Refusal for Family/Significant Other Suicide Prevention Education: The patient Taylor Wiggins has refused to provide written consent for family/significant other to be provided Family/Significant Other Suicide Prevention Education during admission and/or prior to discharge.  Physician notified.  Isabella Bowens 06/24/2023, 4:24 PM

## 2023-06-24 NOTE — Group Note (Signed)
Recreation Therapy Group Note   Group Topic:Animal Assisted Therapy   Group Date: 06/24/2023 Start Time: 0945 End Time: 1031 Facilitators: Shenice Dolder-McCall, LRT,CTRS Location: [address] Dayroom   Animal-Assisted Activity (AAA) Program Checklist/Progress Notes Patient Eligibility Criteria Checklist & Daily Group note for Rec Tx Intervention  AAA/T Program Assumption of Risk Form signed by Patient/ or Parent Legal Guardian Yes  Patient understands his/her participation is voluntary Yes   Affect/Mood: N/A   Participation Level: Did not attend    Clinical Observations/Individualized Feedback:    Plan: Continue to engage patient in RT group sessions 2-3x/week.   Krisalyn Yankowski-McCall, LRT,CTRS  06/24/2023 12:47 PM

## 2023-06-24 NOTE — Progress Notes (Addendum)
[facility] MD Progress Note  06/24/2023 2:59 PM BICH MCHANEY  MRN:  [id number] Principal Problem: Severe bipolar I disorder, current or most recent episode depressed (HCC) Diagnosis: Principal Problem:   Severe bipolar I disorder, current or most recent episode depressed (HCC) Active Problems:   Anxiety disorder   Anxiety   Amphetamine use disorder, severe, dependence (HCC)   Mild benzodiazepine use disorder (HCC)  Reason for Admission: Taylor Wiggins. Taylor Wiggins is a 42 yo CF who presented to the [facility] [place] with c/o suicidal ideations in the context of substance abuse and altercation with her mother and husband.Pt was transferred to this hospital under IVC status for treatment and stabilization of her mental status.   24 hr chart review: Sleep Hours last night: None documented, but patient reports that Taylor Wiggins slept last night. Nursing Concerns: None Behavioral episodes in the past 24 hrs: None Medication Compliance: Compliant Vital Signs in the past 24 hrs: Within normal limits PRN Medications in the past 24 hrs: None overnight  Patient assessment note: As per objective and subjective assessments, patient remains very depressed. Pt with flat affect and depressed mood, attention to personal hygiene and grooming is fair, eye contact is good, speech is clear & coherent. Thought contents are organized and logical, and pt currently denies SI/HI/AVH or paranoia. There is no evidence of delusional thoughts.    Patient reports that Taylor Wiggins would like to go to inpatient rehab, for substance use.  CSW aware, and making attempts to locate rehabilitation.  Patient reports a poor sleep quality last night, states that Taylor Wiggins had trouble falling asleep and staying asleep.  Taylor Wiggins reports appetite is fair, reports being depressed and also reports anxiety.  We are increasing Seroquel to 100 mg nightly to help with sleep and mood, and increasing Zoloft from 50 to 100 mg starting tomorrow morning 6/26.  Continuing other  medications as listed below.  Labs reviewed: Orders placed for baseline EKG, TSH, lipid panel, hemoglobin A1c, vitamin D.  Repeat BMP ordered to ensure that her sodium level is not continuing to trend downwards.  Total Time spent with patient: 45 minutes  Past Psychiatric History: See H & P  Past Medical History:  Past Medical History:  Diagnosis Date   Adult ADHD    Anxiety    Asthma    Hepatitis C    IBS (irritable bowel syndrome)    Opioid use disorder    No longer on buprenorphine therapy   PTSD (post-traumatic stress disorder)     Past Surgical History:  Procedure Laterality Date   BREAST REDUCTION SURGERY  2005   Family History:  Family History  Problem Relation Age of Onset   Anxiety disorder Mother    Diabetes Mother    Anxiety disorder Father    Family Psychiatric  History: See H & P Social History:  Social History   Substance and Sexual Activity  Alcohol Use No     Social History   Substance and Sexual Activity  Drug Use Not Currently   Types: Methamphetamines   Comment: no methamphetamine use in 30 days    Social History   Socioeconomic History   Marital status: Legally Separated    Spouse name: Not on file   Number of children: Not on file   Years of education: Not on file   Highest education level: Not on file  Occupational History   Not on file  Tobacco Use   Smoking status: Every Day    Packs/day: .5  Types: Cigarettes    Last attempt to quit: 09/06/2014    Years since quitting: 8.8   Smokeless tobacco: Never  Vaping Use   Vaping Use: Every day  Substance and Sexual Activity   Alcohol use: No   Drug use: Not Currently    Types: Methamphetamines    Comment: no methamphetamine use in 30 days   Sexual activity: Not Currently    Birth control/protection: Abstinence  Other Topics Concern   Not on file  Social History Narrative   42 y/o Caucasian female. Married and homeless with polysubstance abuse. Per pt-Graduated from [employer] in 2006 with bachelors degree in Business Administration.      04/24/23- Patient reported separated from husband but not legally at this time. Pt states Taylor Wiggins is going through several hardships this year and is currently taking care of mother and using address at this time. Requests information on resources for when Taylor Wiggins is no longer with mother.    Social Determinants of Health   Financial Resource Strain: Not on file  Food Insecurity: Patient Declined (06/22/2023)   Hunger Vital Sign    Worried About Running Out of Food in the Last Year: Patient declined    Ran Out of Food in the Last Year: Patient declined  Recent Concern: Food Insecurity - Food Insecurity Present (04/24/2023)   Hunger Vital Sign    Worried About Running Out of Food in the Last Year: Sometimes true    Ran Out of Food in the Last Year: Sometimes true  Transportation Needs: Patient Declined (06/22/2023)   PRAPARE - Administrator, Civil Service (Medical): Patient declined    Lack of Transportation (Non-Medical): Patient declined  Physical Activity: Not on file  Stress: Not on file  Social Connections: Not on file   Sleep: Poor  Appetite:  Poor  Current Medications: Current Facility-Administered Medications  Medication Dose Route Frequency Provider Last Rate Last Admin   acetaminophen (TYLENOL) tablet 650 mg  650 mg Oral Q6H PRN Jearld Lesch, NP   650 mg at 06/23/23 2115   albuterol (VENTOLIN HFA) 108 (90 Base) MCG/ACT inhaler 2 puff  2 puff Inhalation Q6H PRN Ntuen, Jesusita Oka, FNP       alum & mag hydroxide-simeth (MAALOX/MYLANTA) 200-200-20 MG/5ML suspension 30 mL  30 mL Oral Q4H PRN Dixon, Rashaun M, NP       diphenhydrAMINE (BENADRYL) capsule 50 mg  50 mg Oral TID PRN Jearld Lesch, NP       Or   diphenhydrAMINE (BENADRYL) injection 50 mg  50 mg Intramuscular TID PRN Dixon, Rashaun M, NP       feeding supplement (ENSURE ENLIVE / ENSURE PLUS) liquid 237 mL  237 mL Oral BID BM Ntuen, Tina C,  FNP   237 mL at 06/23/23 1447   haloperidol (HALDOL) tablet 5 mg  5 mg Oral TID PRN Jearld Lesch, NP       Or   haloperidol lactate (HALDOL) injection 5 mg  5 mg Intramuscular TID PRN Jearld Lesch, NP       hydrOXYzine (ATARAX) tablet 25 mg  25 mg Oral TID PRN Jearld Lesch, NP       LORazepam (ATIVAN) tablet 2 mg  2 mg Oral TID PRN Jearld Lesch, NP       Or   LORazepam (ATIVAN) injection 2 mg  2 mg Intramuscular TID PRN Jearld Lesch, NP       magnesium hydroxide (MILK  OF MAGNESIA) suspension 30 mL  30 mL Oral Daily PRN Jearld Lesch, NP       multivitamin with minerals tablet 1 tablet  1 tablet Oral Daily Davie Claud, NP       ondansetron (ZOFRAN-ODT) disintegrating tablet 4 mg  4 mg Oral Q6H PRN Starleen Blue, NP       QUEtiapine (SEROQUEL) tablet 100 mg  100 mg Oral QHS Starleen Blue, NP       [START ON 06/25/2023] sertraline (ZOLOFT) tablet 100 mg  100 mg Oral Daily Jensyn Cambria, NP       traZODone (DESYREL) tablet 50 mg  50 mg Oral QHS PRN Jearld Lesch, NP        Lab Results: No results found for this or any previous visit (from the past 48 hour(s)).  Blood Alcohol level:  Lab Results  Component Value Date   ETH <10 06/20/2023   ETH <10 05/06/2023    Metabolic Disorder Labs: Lab Results  Component Value Date   HGBA1C 5.3 08/06/2022   MPG 105.41 08/06/2022   MPG 108.28 07/06/2022   No results found for: "PROLACTIN" Lab Results  Component Value Date   CHOL 184 08/06/2022   TRIG 169 (H) 08/06/2022   HDL 53 08/06/2022   CHOLHDL 3.5 08/06/2022   VLDL 34 08/06/2022   LDLCALC 97 08/06/2022   LDLCALC 119 (H) 07/06/2022    Physical Findings: AIMS:  , ,  ,  ,    CIWA:    COWS:     Musculoskeletal: Strength & Muscle Tone: within normal limits Gait & Station: normal Patient leans: N/A  Psychiatric Specialty Exam:  Presentation  General Appearance:  Appropriate for Environment; Fairly Groomed  Eye Contact: Good  Speech: Clear and  Coherent  Speech Volume: Normal  Handedness: Right   Mood and Affect  Mood: Depressed; Anxious  Affect: Congruent   Thought Process  Thought Processes: Coherent  Descriptions of Associations:Intact  Orientation:Full (Time, Place and Person)  Thought Content:Logical  History of Schizophrenia/Schizoaffective disorder:No  Duration of Psychotic Symptoms:N/A  Hallucinations:Hallucinations: None  Ideas of Reference:None  Suicidal Thoughts:Suicidal Thoughts: No SI Active Intent and/or Plan: With Intent; Without Plan  Homicidal Thoughts:Homicidal Thoughts: No   Sensorium  Memory: Immediate Good  Judgment: Fair  Insight: Fair   Chartered certified accountant: Fair  Attention Span: Fair  Recall: [employer] of Knowledge: Fair  Language: Fair   Psychomotor Activity  Psychomotor Activity: Psychomotor Activity: Normal   Assets  Assets: Communication Skills  Sleep  Sleep: Sleep: Fair  Physical Exam: Physical Exam Constitutional:      Appearance: Normal appearance.  HENT:     Nose: Nose normal.  Eyes:     Pupils: Pupils are equal, round, and reactive to light.  Musculoskeletal:        General: Normal range of motion.     Cervical back: Normal range of motion.  Neurological:     Mental Status: Taylor Wiggins is alert.    Review of Systems  Constitutional:  Negative for fever.  HENT:  Negative for hearing loss.   Eyes:  Negative for blurred vision.  Respiratory:  Negative for cough.   Cardiovascular:  Negative for chest pain.  Gastrointestinal:  Negative for heartburn.  Genitourinary:  Negative for dysuria.  Musculoskeletal:  Negative for myalgias.  Neurological:  Negative for dizziness.  Psychiatric/Behavioral:  Positive for depression and substance abuse. Negative for hallucinations, memory loss and suicidal ideas. The patient is nervous/anxious and has insomnia.  Blood pressure 109/77, pulse 66, temperature 98.9 F (37.2 C),  temperature source Oral, resp. rate 18, height 5\' 7"  (1.702 m), weight 61.5 kg, last menstrual period 05/20/2023, SpO2 98 %. Body mass index is 21.22 kg/m.   Treatment Plan Summary: Daily contact with patient to assess and evaluate symptoms and progress in treatment and Medication management  Safety and Monitoring: Voluntary admission to inpatient psychiatric unit for safety, stabilization and treatment Daily contact with patient to assess and evaluate symptoms and progress in treatment Patient's case to be discussed in multi-disciplinary team meeting Observation Level : q15 minute checks Vital signs: q12 hours Precautions: Safety  Long Term Goal(s): Improvement in symptoms so as ready for discharge  Short Term Goals: Ability to identify changes in lifestyle to reduce recurrence of condition will improve, Ability to disclose and discuss suicidal ideas, Ability to identify and develop effective coping behaviors will improve, Ability to maintain clinical measurements within normal limits will improve, Compliance with prescribed medications will improve, and Ability to identify triggers associated with substance abuse/mental health issues will improve  Diagnoses Principal Problem:   Severe bipolar I disorder, current or most recent episode depressed (HCC) Active Problems:   Anxiety disorder   Anxiety   Amphetamine use disorder, severe, dependence (HCC)   Mild benzodiazepine use disorder (HCC)  Medications -Discontinue Klonopin 0.5 mg PRNs -Discontinue Zyprexa IM/p.o. 10 mg as needed -Increase Seroquel from 50 to 100 mg nightly for mood stabilization and sleep -Increase Zoloft from 50 to 100 mg starting 6/26 -Continue hydroxyzine 25 mg 3 times daily as needed for anxiety -Albuterol as needed for wheezing or shortness of breath -Continue agitation protocol medications: Ativan/Benadryl/Haldol as needed 3 times daily for agitation   PRNS -Continue trazodone 50 mg as needed nightly for  sleep -Continue Tylenol 650 mg every 6 hours PRN for mild pain -Continue Maalox 30 mg every 4 hrs PRN for indigestion -Continue Milk of Magnesia as needed every 6 hrs for constipation  Labs reviewed: Will need PCP referral prior to discharge as platelets are chronically elevated.  Repeating BMP for 6/26 and sodium was 133 on admission.  Orders placed for TSH, hemoglobin A1c and lipid panel.  Baseline EKG ordered.  Discharge Planning: Social work and case management to assist with discharge planning and identification of hospital follow-up needs prior to discharge Estimated LOS: 5-7 days Discharge Concerns: Need to establish a safety plan; Medication compliance and effectiveness Discharge Goals: Return home with outpatient referrals for mental health follow-up including medication management/psychotherapy  I certify that inpatient services furnished can reasonably be expected to improve the patient's condition.    Starleen Blue, NP 6/25/20242:59 PM

## 2023-06-24 NOTE — Progress Notes (Signed)
Adult Psychoeducational Group Note  Date:  06/24/2023 Time:  11:37 PM  Group Topic/Focus:  Wrap-Up Group:   The focus of this group is to help patients review their daily goal of treatment and discuss progress on daily workbooks.  Participation Level:  Did Not Attend  Participation Quality:  Appropriate  Affect:  Appropriate  Cognitive:  Appropriate  Insight: Appropriate  Engagement in Group:  Engaged  Modes of Intervention:  Discussion  Additional Comments:   Pt did not attend group.  Joselyn Arrow 06/24/2023, 11:37 PM

## 2023-06-24 NOTE — Progress Notes (Signed)
   06/24/23 2000  Psych Admission Type (Psych Patients Only)  Admission Status Involuntary  Psychosocial Assessment  Patient Complaints Anxiety;Depression  Eye Contact Fair  Facial Expression Flat  Affect Depressed  Speech Logical/coherent  Interaction Assertive  Motor Activity Slow  Appearance/Hygiene Unremarkable  Behavior Characteristics Unwilling to participate  Mood Depressed  Thought Process  Coherency WDL  Content WDL  Delusions None reported or observed  Perception WDL  Hallucination None reported or observed  Judgment Poor  Danger to Self  Current suicidal ideation? Active  Description of Suicide Plan No plan  Self-Injurious Behavior No self-injurious ideation or behavior indicators observed or expressed   Agreement Not to Harm Self Yes  Description of Agreement verbal  Danger to Others  Danger to Others None reported or observed   D: Pt alert and oriented. Pt rates depression 5/10 and anxiety 5/10.  Pt denies experiencing any HI, or AVH at this time but endorses SI without a plan but verbally agreed to contract for safety.  A: Scheduled medications administered to pt, per MD orders. Support and encouragement provided. Frequent verbal contact made. Routine safety checks conducted q15 minutes.   R: No adverse drug reactions noted. Pt verbally contracts for safety at this time. Pt complaint with medications and treatment plan. Patient is isolated and unwilling to participate in groups. Pt remains safe at this time. Will continue to monitor.

## 2023-06-24 NOTE — Plan of Care (Signed)
Nurse discussed anxiety, depression and coping skills with patient.  

## 2023-06-25 DIAGNOSIS — F1193 Opioid use, unspecified with withdrawal: Secondary | ICD-10-CM | POA: Diagnosis present

## 2023-06-25 DIAGNOSIS — F314 Bipolar disorder, current episode depressed, severe, without psychotic features: Secondary | ICD-10-CM | POA: Diagnosis not present

## 2023-06-25 LAB — BASIC METABOLIC PANEL
Anion gap: 7 (ref 5–15)
BUN: 15 mg/dL (ref 6–20)
CO2: 26 mmol/L (ref 22–32)
Calcium: 9.2 mg/dL (ref 8.9–10.3)
Chloride: 106 mmol/L (ref 98–111)
Creatinine, Ser: 0.87 mg/dL (ref 0.44–1.00)
GFR, Estimated: >60 mL/min (ref >60)
Glucose, Bld: 105 mg/dL — ABNORMAL HIGH (ref 70–99)
Potassium: 4.2 mmol/L (ref 3.5–5.1)
Sodium: 139 mmol/L (ref 135–145)

## 2023-06-25 LAB — LIPID PANEL
Cholesterol: 194 mg/dL (ref 0–200)
HDL: 57 mg/dL (ref >40)
LDL Cholesterol: 122 mg/dL — ABNORMAL HIGH (ref 0–99)
Total CHOL/HDL Ratio: 3.4 RATIO
Triglycerides: 74 mg/dL (ref <150)
VLDL: 15 mg/dL (ref 0–40)

## 2023-06-25 LAB — VITAMIN D 25 HYDROXY (VIT D DEFICIENCY, FRACTURES): Vit D, 25-Hydroxy: 23.73 ng/mL — ABNORMAL LOW (ref 30–100)

## 2023-06-25 LAB — TSH: TSH: 3.186 u[IU]/mL (ref 0.350–4.500)

## 2023-06-25 MED ORDER — VITAMIN D3 25 MCG PO TABS
1000.0000 [IU] | ORAL_TABLET | Freq: Every day | ORAL | Status: DC
  Administered 2023-06-25 – 2023-06-29 (×5): 1000 [IU] via ORAL
  Filled 2023-06-25 (×9): qty 1

## 2023-06-25 MED ORDER — CLONIDINE HCL 0.1 MG PO TABS
0.1000 mg | ORAL_TABLET | Freq: Two times a day (BID) | ORAL | Status: DC
  Administered 2023-06-25 – 2023-06-26 (×2): 0.1 mg via ORAL
  Filled 2023-06-25 (×8): qty 1

## 2023-06-25 MED ORDER — ONDANSETRON 8 MG PO TBDP
8.0000 mg | ORAL_TABLET | Freq: Once | ORAL | Status: AC
  Administered 2023-06-25: 8 mg via ORAL
  Filled 2023-06-25: qty 1
  Filled 2023-06-25: qty 2

## 2023-06-25 MED ORDER — IBUPROFEN 600 MG PO TABS
600.0000 mg | ORAL_TABLET | Freq: Once | ORAL | Status: AC | PRN
  Administered 2023-06-26: 600 mg via ORAL
  Filled 2023-06-25: qty 1

## 2023-06-25 MED ORDER — DICYCLOMINE HCL 10 MG PO CAPS
10.0000 mg | ORAL_CAPSULE | Freq: Two times a day (BID) | ORAL | Status: DC
  Administered 2023-06-25 – 2023-06-27 (×5): 10 mg via ORAL
  Filled 2023-06-25 (×12): qty 1

## 2023-06-25 MED ORDER — LOPERAMIDE HCL 2 MG PO CAPS: 2.0000 mg | ORAL_CAPSULE | Freq: Three times a day (TID) | ORAL | Status: DC | PRN

## 2023-06-25 NOTE — Progress Notes (Signed)
[facility] LCSW Progress Note   06/25/2023    2:33 PM  Taylor Wiggins   [id number]   Type of Contact and Topic:  [facility] referral  Admissions coordinator requested for patient to call and participate in initial screener.  CSW provided patient number to [facility] and she agreed to participate.  CSW agreed to call residential facility at 4pm to learn about any update in the referral.    Signed:  Anson [place] MSW, LCSW, LCAS 06/25/2023 2:33 PM

## 2023-06-25 NOTE — [facility] Group Notes (Signed)
Psychoeducational Group Note  Date:  06/25/2023 Time:  2000  Group Topic/Focus:  Narcotics Anonymous Meeting  Participation Level: Did Not Attend  Participation Quality:  Not Applicable  Affect:  Not Applicable  Cognitive:  Not Applicable  Insight:  Not Applicable  Engagement in Group: Not Applicable  Additional Comments:  Did not attend.   Marcille Buffy 06/25/2023, 10:18 PM

## 2023-06-25 NOTE — Group Note (Signed)
Recreation Therapy Group Note   Group Topic:Stress Management  Group Date: 06/25/2023 Start Time: 0935 End Time: 1000 Facilitators: Artemio Dobie-McCall, LRT,CTRS Location: [address] Dayroom   Goal Area(s) Addresses:  Patient will actively participate in stress management techniques presented during session.  Patient will successfully identify benefit of practicing stress management post d/c.   Group Description: Guided Imagery. LRT provided education, instruction, and demonstration on practice of visualization via guided imagery. Patient was asked to participate in the technique introduced during session. LRT debriefed including topics of mindfulness, stress management and specific scenarios each patient could use these techniques. Patients were given suggestions of ways to access scripts post d/c and encouraged to explore Youtube and other apps available on smartphones, tablets, and computers.   Affect/Mood: N/A   Participation Level: Did not attend    Clinical Observations/Individualized Feedback:      Plan: Continue to engage patient in RT group sessions 2-3x/week.   Taylor Wiggins, LRT,CTRS 06/25/2023 11:10 AM

## 2023-06-25 NOTE — Progress Notes (Signed)
   06/25/23 2037  Psych Admission Type (Psych Patients Only)  Admission Status Involuntary  Psychosocial Assessment  Patient Complaints Anxiety;Depression  Eye Contact Fair  Facial Expression Flat  Affect Depressed  Speech Logical/coherent  Interaction Assertive  Motor Activity Slow  Appearance/Hygiene Unremarkable  Behavior Characteristics Cooperative;Appropriate to situation  Mood Pleasant  Thought Process  Coherency WDL  Content WDL  Delusions None reported or observed  Perception WDL  Hallucination None reported or observed  Judgment Poor  Confusion None  Danger to Self  Current suicidal ideation? Active  Self-Injurious Behavior No self-injurious ideation or behavior indicators observed or expressed   Agreement Not to Harm Self Yes  Description of Agreement verbal  Danger to Others  Danger to Others None reported or observed   D: Pt alert and oriented. Pt rates depression 5/10 and anxiety 5/10.  Pt denies experiencing any HI, or AVH at this time. She endorses SI without a plan but contracts for safety.  A: Scheduled medications administered to pt, per MD orders. Support and encouragement provided. Frequent verbal contact made. Routine safety checks conducted q15 minutes.   R: No adverse drug reactions noted. Pt verbally contracts for safety at this time. Pt complaint with medications and treatment plan. Pt isolates herself in the  room. She c/o toothache. She was asking for Iboprofen.Got one time order for her but she refused as the pain subsided. Pt remains safe at this time. Will continue to monitor.

## 2023-06-25 NOTE — Progress Notes (Cosign Needed Addendum)
[facility] MD Progress Note  06/25/2023 3:38 PM Taylor Wiggins  MRN:  [id number] Principal Problem: Severe bipolar I disorder, current or most recent episode depressed (HCC) Diagnosis: Principal Problem:   Severe bipolar I disorder, current or most recent episode depressed (HCC) Active Problems:   Anxiety disorder   Anxiety   Amphetamine use disorder, severe, dependence (HCC)   Mild benzodiazepine use disorder (HCC)   Opioid use with withdrawal (HCC)  Reason for Admission: Taylor Wiggins is a 42 yo CF who presented to the [facility] [place] with c/o suicidal ideations in the context of substance abuse and altercation with her mother and husband.Pt was transferred to this hospital under IVC status for treatment and stabilization of her mental status.   24 hr chart review: Sleep Hours last night: None documented, but patient reports that sleep was fair. Nursing Concerns: None Behavioral episodes in the past 24 hrs: None Medication Compliance: Compliant Vital Signs in the past 24 hrs: Within normal limits PRN Medications in the past 24 hrs: None overnight  Patient assessment note: Depressive symptoms remain persistent, pt endorses +SI, denies any plan or intent to harm herself, verbally contracts for safety on the unit.  Attention to personal hygiene and grooming is poor, and the need to tend to personal hygiene reiterated. Energy is low, she reports decreased motivation along with symptoms of Opoid withdrawals; She reports frequent yawning, nausea, sweating, stomach cramps & diarrhea.  Reports that prior to this hospitalization, she was using fentanyl and heroin, she states that it was a mixture of both substances, and that she was using less than half of a gram on a daily basis for 1-1/2 weeks prior to this admission.  Clonidine 0.1 mg BID for Opioid withdrawal symptoms ordered (hold for low BP). Unsure if she was using synthetic Opioids as this was not positive on her toxicology screen. Will  also place order for Robaxin & Naproxen PRN.   Her eye contact is good, speech is clear & coherent. Thought contents are organized and logical, and pt currently denies HI/AVH or paranoia. There is no evidence of delusional thoughts.    Patient is continuing to report her interest in inpatient rehab, for substance use.  CSW aware, and making attempts to locate rehabilitation.  Patient reports a fair sleep quality last night, states that sleep was better than the night prior, but not where she wants it to be.  Zoloft and Seroquel were both increased yesterday. We are continuing medications today as listed below and adding daily Vitamin D for low Vitamin D levels.  Labs reviewed: LFTs are elevated. Will reorder for tomorrow morning to ascertain if trending downwards.  Also ordering acute hepatitis panel to ascertain if, this is reason for the, elevation. Will need PCP follow-up for elevated platelet levels which seem to be a chronic problem.  Total Time spent with patient: 45 minutes  Past Psychiatric History: See H & P  Past Medical History:  Past Medical History:  Diagnosis Date   Adult ADHD    Anxiety    Asthma    Hepatitis C    IBS (irritable bowel syndrome)    Opioid use disorder    No longer on buprenorphine therapy   PTSD (post-traumatic stress disorder)     Past Surgical History:  Procedure Laterality Date   BREAST REDUCTION SURGERY  2005   Family History:  Family History  Problem Relation Age of Onset   Anxiety disorder Mother    Diabetes Mother  Anxiety disorder Father    Family Psychiatric  History: See H & P Social History:  Social History   Substance and Sexual Activity  Alcohol Use No     Social History   Substance and Sexual Activity  Drug Use Not Currently   Types: Methamphetamines   Comment: no methamphetamine use in 30 days    Social History   Socioeconomic History   Marital status: Legally Separated    Spouse name: Not on file   Number of  children: Not on file   Years of education: Not on file   Highest education level: Not on file  Occupational History   Not on file  Tobacco Use   Smoking status: Every Day    Packs/day: .5    Types: Cigarettes    Last attempt to quit: 09/06/2014    Years since quitting: 8.8   Smokeless tobacco: Never  Vaping Use   Vaping Use: Every day  Substance and Sexual Activity   Alcohol use: No   Drug use: Not Currently    Types: Methamphetamines    Comment: no methamphetamine use in 30 days   Sexual activity: Not Currently    Birth control/protection: Abstinence  Other Topics Concern   Not on file  Social History Narrative   42 y/o Caucasian female. Married and homeless with polysubstance abuse. Per pt-Graduated from [employer] in 2006 with bachelors degree in Business Administration.      04/24/23- Patient reported separated from husband but not legally at this time. Pt states she is going through several hardships this year and is currently taking care of mother and using address at this time. Requests information on resources for when she is no longer with mother.    Social Determinants of Health   Financial Resource Strain: Not on file  Food Insecurity: Patient Declined (06/22/2023)   Hunger Vital Sign    Worried About Running Out of Food in the Last Year: Patient declined    Ran Out of Food in the Last Year: Patient declined  Recent Concern: Food Insecurity - Food Insecurity Present (04/24/2023)   Hunger Vital Sign    Worried About Running Out of Food in the Last Year: Sometimes true    Ran Out of Food in the Last Year: Sometimes true  Transportation Needs: Patient Declined (06/22/2023)   PRAPARE - Administrator, Civil Service (Medical): Patient declined    Lack of Transportation (Non-Medical): Patient declined  Physical Activity: Not on file  Stress: Not on file  Social Connections: Not on file   Sleep: Poor  Appetite:  Poor  Current Medications: Current  Facility-Administered Medications  Medication Dose Route Frequency Provider Last Rate Last Admin   acetaminophen (TYLENOL) tablet 650 mg  650 mg Oral Q6H PRN Jearld Lesch, NP   650 mg at 06/23/23 2115   albuterol (VENTOLIN HFA) 108 (90 Base) MCG/ACT inhaler 2 puff  2 puff Inhalation Q6H PRN Ntuen, Jesusita Oka, FNP       alum & mag hydroxide-simeth (MAALOX/MYLANTA) 200-200-20 MG/5ML suspension 30 mL  30 mL Oral Q4H PRN Dixon, Rashaun M, NP       cloNIDine (CATAPRES) tablet 0.1 mg  0.1 mg Oral BID Ilhan Madan, NP   0.1 mg at 06/25/23 1122   dicyclomine (BENTYL) capsule 10 mg  10 mg Oral BID Starleen Blue, NP   10 mg at 06/25/23 1122   diphenhydrAMINE (BENADRYL) capsule 50 mg  50 mg Oral TID PRN Jearld Lesch,  NP       Or   diphenhydrAMINE (BENADRYL) injection 50 mg  50 mg Intramuscular TID PRN Dixon, Rashaun M, NP       feeding supplement (ENSURE ENLIVE / ENSURE PLUS) liquid 237 mL  237 mL Oral BID BM Ntuen, Jesusita Oka, FNP   237 mL at 06/24/23 1539   haloperidol (HALDOL) tablet 5 mg  5 mg Oral TID PRN Jearld Lesch, NP       Or   haloperidol lactate (HALDOL) injection 5 mg  5 mg Intramuscular TID PRN Jearld Lesch, NP       hydrOXYzine (ATARAX) tablet 25 mg  25 mg Oral TID PRN Jearld Lesch, NP       loperamide (IMODIUM) capsule 2 mg  2 mg Oral TID PRN Starleen Blue, NP       LORazepam (ATIVAN) tablet 2 mg  2 mg Oral TID PRN Jearld Lesch, NP       Or   LORazepam (ATIVAN) injection 2 mg  2 mg Intramuscular TID PRN Jearld Lesch, NP       magnesium hydroxide (MILK OF MAGNESIA) suspension 30 mL  30 mL Oral Daily PRN Jearld Lesch, NP       multivitamin with minerals tablet 1 tablet  1 tablet Oral Daily Starleen Blue, NP   1 tablet at 06/25/23 0807   ondansetron (ZOFRAN-ODT) disintegrating tablet 4 mg  4 mg Oral Q6H PRN Starleen Blue, NP       QUEtiapine (SEROQUEL) tablet 100 mg  100 mg Oral QHS Starleen Blue, NP   100 mg at 06/24/23 2128   sertraline (ZOLOFT) tablet 100 mg   100 mg Oral Daily Starleen Blue, NP   100 mg at 06/25/23 1610   traZODone (DESYREL) tablet 50 mg  50 mg Oral QHS PRN Jearld Lesch, NP       vitamin D3 (CHOLECALCIFEROL) tablet 1,000 Units  1,000 Units Oral Daily Starleen Blue, NP        Lab Results:  Results for orders placed or performed during the hospital encounter of 06/22/23 (from the past 48 hour(s))  Basic metabolic panel     Status: Abnormal   Collection Time: 06/25/23  6:31 AM  Result Value Ref Range   Sodium 139 135 - 145 mmol/L   Potassium 4.2 3.5 - 5.1 mmol/L   Chloride 106 98 - 111 mmol/L   CO2 26 22 - 32 mmol/L   Glucose, Bld 105 (H) 70 - 99 mg/dL    Comment: Glucose reference range applies only to samples taken after fasting for at least 8 hours.   BUN 15 6 - 20 mg/dL   Creatinine, Ser 9.60 0.44 - 1.00 mg/dL   Calcium 9.2 8.9 - 45.4 mg/dL   GFR, Estimated >09 >81 mL/min    Comment: (NOTE) Calculated using the CKD-EPI Creatinine Equation (2021)    Anion gap 7 5 - 15    Comment: Performed at [facility], [address] [place], [place] [address]  Lipid panel     Status: Abnormal   Collection Time: 06/25/23  6:31 AM  Result Value Ref Range   Cholesterol 194 0 - 200 mg/dL   Triglycerides 74 <829 mg/dL   HDL 57 >56 mg/dL   Total CHOL/HDL Ratio 3.4 RATIO   VLDL 15 0 - 40 mg/dL   LDL Cholesterol 213 (H) 0 - 99 mg/dL    Comment:        Total  Cholesterol/HDL:CHD Risk Coronary Heart Disease Risk Table                     Men   Women  1/2 Average Risk   3.4   3.3  Average Risk       5.0   4.4  2 X Average Risk   9.6   7.1  3 X Average Risk  23.4   11.0        Use the calculated Patient Ratio above and the CHD Risk Table to determine the patient's CHD Risk.        ATP III CLASSIFICATION (LDL):  <100     mg/dL   Optimal  914-782  mg/dL   Near or Above                    Optimal  130-159  mg/dL   Borderline  956-213  mg/dL   High  >086     mg/dL   Very High Performed at [facility], [address], [place], [place] [address]   TSH     Status: None   Collection Time: 06/25/23  6:31 AM  Result Value Ref Range   TSH 3.186 0.350 - 4.500 uIU/mL    Comment: Performed by a 3rd Generation assay with a functional sensitivity of <=0.01 uIU/mL. Performed at [facility], [address] [place], [place] [address]   VITAMIN D 25 Hydroxy (Vit-D Deficiency, Fractures)     Status: Abnormal   Collection Time: 06/25/23  6:31 AM  Result Value Ref Range   Vit D, 25-Hydroxy 23.73 (L) 30 - 100 ng/mL    Comment: (NOTE) Vitamin D deficiency has been defined by the Institute of Medicine  and an Endocrine Society practice guideline as a level of serum 25-OH  vitamin D less than 20 ng/mL (1,2). The Endocrine Society went on to  further define vitamin D insufficiency as a level between 21 and 29  ng/mL (2).  1. IOM (Institute of Medicine). 2010. Dietary reference intakes for  calcium and D. [place]: [employer]. 2. Holick MF, Binkley Dickinson, Bischoff-Ferrari HA, et al. Evaluation,  treatment, and prevention of vitamin D deficiency: an Endocrine  Society clinical practice guideline, JCEM. 2011 Jul; 96(7): 1911-30.  Performed at [facility], [address] [place], [place] [address]     Blood Alcohol level:  Lab Results  Component Value Date   ETH <10 06/20/2023   ETH <10 05/06/2023    Metabolic Disorder Labs: Lab Results  Component Value Date   HGBA1C 5.3 08/06/2022   MPG 105.41 08/06/2022   MPG 108.28 07/06/2022   No results found for: "PROLACTIN" Lab Results  Component Value Date   CHOL 194 06/25/2023   TRIG 74 06/25/2023   HDL 57 06/25/2023   CHOLHDL 3.4 06/25/2023   VLDL 15 06/25/2023   LDLCALC 122 (H) 06/25/2023   LDLCALC 97 08/06/2022    Physical Findings: AIMS:  , ,  ,  ,    CIWA:  CIWA-Ar Total: 5 COWS:     Musculoskeletal: Strength & Muscle Tone: within normal limits Gait & Station:  normal Patient leans: N/A  Psychiatric Specialty Exam:  Presentation  General Appearance:  Fairly Groomed  Eye Contact: Fair  Speech: Clear and Coherent  Speech Volume: Normal  Handedness: Right   Mood and Affect  Mood: Depressed; Anxious  Affect: Congruent   Thought Process  Thought Processes:  Coherent  Descriptions of Associations:Intact  Orientation:Full (Time, Place and Person)  Thought Content:Logical  History of Schizophrenia/Schizoaffective disorder:No  Duration of Psychotic Symptoms:N/A  Hallucinations:Hallucinations: None  Ideas of Reference:None  Suicidal Thoughts:Suicidal Thoughts: No  Homicidal Thoughts:Homicidal Thoughts: No   Sensorium  Memory: Immediate Good  Judgment: Fair  Insight: Fair   Art therapist  Concentration: Fair  Attention Span: Fair  Recall: Fair  Fund of Knowledge: Fair  Language: Fair   Psychomotor Activity  Psychomotor Activity: Psychomotor Activity: Normal   Assets  Assets: Resilience  Sleep  Sleep: Sleep: Fair  Physical Exam: Physical Exam Constitutional:      Appearance: Normal appearance.  HENT:     Nose: Nose normal.  Eyes:     Pupils: Pupils are equal, round, and reactive to light.  Musculoskeletal:        General: Normal range of motion.     Cervical back: Normal range of motion.  Neurological:     Mental Status: She is alert.    Review of Systems  Constitutional:  Negative for fever.  HENT:  Negative for hearing loss.   Eyes:  Negative for blurred vision.  Respiratory:  Negative for cough.   Cardiovascular:  Negative for chest pain.  Gastrointestinal:  Negative for heartburn.  Genitourinary:  Negative for dysuria.  Musculoskeletal:  Negative for myalgias.  Neurological:  Negative for dizziness.  Psychiatric/Behavioral:  Positive for depression, substance abuse and suicidal ideas. Negative for hallucinations and memory loss. The patient is nervous/anxious  and has insomnia.    Blood pressure 106/77, pulse 73, temperature 98.9 F (37.2 C), temperature source Oral, resp. rate 18, height 5\' 7"  (1.702 m), weight 61.5 kg, last menstrual period 05/20/2023, SpO2 99 %. Body mass index is 21.22 kg/m.   Treatment Plan Summary: Daily contact with patient to assess and evaluate symptoms and progress in treatment and Medication management  Safety and Monitoring: Voluntary admission to inpatient psychiatric unit for safety, stabilization and treatment Daily contact with patient to assess and evaluate symptoms and progress in treatment Patient's case to be discussed in multi-disciplinary team meeting Observation Level : q15 minute checks Vital signs: q12 hours Precautions: Safety  Long Term Goal(s): Improvement in symptoms so as ready for discharge  Short Term Goals: Ability to identify changes in lifestyle to reduce recurrence of condition will improve, Ability to disclose and discuss suicidal ideas, Ability to identify and develop effective coping behaviors will improve, Ability to maintain clinical measurements within normal limits will improve, Compliance with prescribed medications will improve, and Ability to identify triggers associated with substance abuse/mental health issues will improve  Diagnoses Principal Problem:   Severe bipolar I disorder, current or most recent episode depressed (HCC) Active Problems:   Anxiety disorder   Anxiety   Amphetamine use disorder, severe, dependence (HCC)   Mild benzodiazepine use disorder (HCC)   Opioid use with withdrawal (HCC)  Medications -Start Clonidine 0.1 mg BID for Opioid withdrawal symptoms -Start Bentyl 10 mg BID for stomach cramps -Start Robaxin 500mg  BID PRN for muscle pain -Start Naproxen 500 mg BID PRN for pain -Start Vitamin D 3 daily for low Vitamin D levels. -Continue Seroquel 100 mg nightly for mood stabilization and sleep -Continue Zoloft 100 mg daily for MDD -Continue hydroxyzine  25 mg 3 times daily as needed for anxiety -Albuterol as needed for wheezing or shortness of breath -Continue agitation protocol medications: Ativan/Benadryl/Haldol as needed 3 times daily for agitation -Previously Discontinued Klonopin 0.5 mg PRNs -Previously Discontinue Zyprexa IM/p.o. 10  mg as needed   PRNS -Continue trazodone 50 mg as needed nightly for sleep -Continue Tylenol 650 mg every 6 hours PRN for mild pain -Continue Maalox 30 mg every 4 hrs PRN for indigestion -Continue Milk of Magnesia as needed every 6 hrs for constipation  Discharge Planning: Social work and case management to assist with discharge planning and identification of hospital follow-up needs prior to discharge Estimated LOS: 5-7 days Discharge Concerns: Need to establish a safety plan; Medication compliance and effectiveness Discharge Goals: Return home with outpatient referrals for mental health follow-up including medication management/psychotherapy  I certify that inpatient services furnished can reasonably be expected to improve the patient's condition.    Starleen Blue, NP 6/26/20243:38 PM    Patient ID: Taylor Wiggins, female   DOB: [birth year], 42 y.o.   MRN: [id number]

## 2023-06-25 NOTE — Progress Notes (Signed)
   06/25/23 1002  Psych Admission Type (Psych Patients Only)  Admission Status Involuntary  Psychosocial Assessment  Patient Complaints Anxiety;Depression;Isolation  Eye Contact Brief  Facial Expression Flat  Affect Depressed  Speech Logical/coherent  Interaction Assertive;Isolative  Motor Activity Slow  Appearance/Hygiene Unremarkable  Mood Depressed  Thought Process  Coherency WDL  Content WDL  Delusions None reported or observed  Perception WDL  Hallucination None reported or observed  Judgment Poor  Confusion None  Danger to Self  Current suicidal ideation? Passive  Description of Suicide Plan no plan  Self-Injurious Behavior Some self-injurious ideation observed or expressed.  No lethal plan expressed   Agreement Not to Harm Self Yes  Description of Agreement verbal  Danger to Others  Danger to Others None reported or observed

## 2023-06-26 DIAGNOSIS — F314 Bipolar disorder, current episode depressed, severe, without psychotic features: Secondary | ICD-10-CM | POA: Diagnosis not present

## 2023-06-26 LAB — HEMOGLOBIN A1C
Hgb A1c MFr Bld: 5.6 % (ref 4.8–5.6)
Mean Plasma Glucose: 114 mg/dL

## 2023-06-26 LAB — HEPATITIS PANEL, ACUTE
HCV Ab: REACTIVE — AB
Hep A IgM: NONREACTIVE
Hep B C IgM: NONREACTIVE
Hepatitis B Surface Ag: NONREACTIVE

## 2023-06-26 LAB — HEPATIC FUNCTION PANEL
ALT: 92 U/L — ABNORMAL HIGH (ref 0–44)
AST: 46 U/L — ABNORMAL HIGH (ref 15–41)
Albumin: 3.9 g/dL (ref 3.5–5.0)
Alkaline Phosphatase: 47 U/L (ref 38–126)
Bilirubin, Direct: <0.1 mg/dL (ref 0.0–0.2)
Total Bilirubin: 0.6 mg/dL (ref 0.3–1.2)
Total Protein: 7.3 g/dL (ref 6.5–8.1)

## 2023-06-26 NOTE — Progress Notes (Signed)
[facility] MD Progress Note  06/26/2023 4:58 PM Taylor Wiggins  MRN:  [id number] Principal Problem: Severe bipolar I disorder, current or most recent episode depressed (HCC) Diagnosis: Principal Problem:   Severe bipolar I disorder, current or most recent episode depressed (HCC) Active Problems:   Anxiety disorder   Anxiety   Amphetamine use disorder, severe, dependence (HCC)   Mild benzodiazepine use disorder (HCC)   Opioid use with withdrawal (HCC)  Reason for Admission: Taylor Posey. Wiggins is a 42 yo CF who presented to the [facility] [place] with c/o suicidal ideations in the context of substance abuse and altercation with her mother and husband.Pt was transferred to this hospital under IVC status for treatment and stabilization of her mental status.   Daily notes: Taylor Wiggins is seen in her room. She was lying down in bed. Chart reviewed. The chart findings discussed with the treatment team. She presents alert, oriented & aware of situation. She is not very visible on the unit expect when asked to come take her medications. She is not attending any group sessions. She reports today, "I feel a little dizzy this morning. I think the clonidine is the cause of my dizziness. I'm not going to take it any more. I'm suppose to call the [facility] treatment center. I'm waiting till the group is over so I can call. I'm not feeling happy or sad. I'm still having the opioid withdrawal symptoms (nausea, hot/cold flashes). Taylor Wiggins currently denies any SIHI, AVH, delusional thoughts or paranoia. She does not appear to be responding to any internal stimuli. Patient is again encouraged to attend group sessions. Make an effort to call the [facility] treatment center to assure her substance abuse treatment continues after her discharge from [facility]. Reviewed current lab results, the liver enzymes are trending down. Vital signs remain stable. Continue current plan of care as already in progress.   Per previous notes: Labs  reviewed: LFTs are elevated. Will reorder for tomorrow morning to ascertain if trending downwards.  Also ordering acute hepatitis panel to ascertain if, this is reason for the, elevation. Will need PCP follow-up for elevated platelet levels which seem to be a chronic problem.  Total Time spent with patient:  35 minutes  Past Psychiatric History: See H & P  Past Medical History:  Past Medical History:  Diagnosis Date   Adult ADHD    Anxiety    Asthma    Hepatitis C    IBS (irritable bowel syndrome)    Opioid use disorder    No longer on buprenorphine therapy   PTSD (post-traumatic stress disorder)     Past Surgical History:  Procedure Laterality Date   BREAST REDUCTION SURGERY  2005   Family History:  Family History  Problem Relation Age of Onset   Anxiety disorder Mother    Diabetes Mother    Anxiety disorder Father    Family Psychiatric  History: See H & P Social History:  Social History   Substance and Sexual Activity  Alcohol Use No     Social History   Substance and Sexual Activity  Drug Use Not Currently   Types: Methamphetamines   Comment: no methamphetamine use in 30 days    Social History   Socioeconomic History   Marital status: Legally Separated    Spouse name: Not on file   Number of children: Not on file   Years of education: Not on file   Highest education level: Not on file  Occupational History  Not on file  Tobacco Use   Smoking status: Every Day    Packs/day: .5    Types: Cigarettes    Last attempt to quit: 09/06/2014    Years since quitting: 8.8   Smokeless tobacco: Never  Vaping Use   Vaping Use: Every day  Substance and Sexual Activity   Alcohol use: No   Drug use: Not Currently    Types: Methamphetamines    Comment: no methamphetamine use in 30 days   Sexual activity: Not Currently    Birth control/protection: Abstinence  Other Topics Concern   Not on file  Social History Narrative   42 y/o Caucasian female. Married and  homeless with polysubstance abuse. Per pt-Graduated from [employer] in 2006 with bachelors degree in Business Administration.      04/24/23- Patient reported separated from husband but not legally at this time. Pt states she is going through several hardships this year and is currently taking care of mother and using address at this time. Requests information on resources for when she is no longer with mother.    Social Determinants of Health   Financial Resource Strain: Not on file  Food Insecurity: Patient Declined (06/22/2023)   Hunger Vital Sign    Worried About Running Out of Food in the Last Year: Patient declined    Ran Out of Food in the Last Year: Patient declined  Recent Concern: Food Insecurity - Food Insecurity Present (04/24/2023)   Hunger Vital Sign    Worried About Running Out of Food in the Last Year: Sometimes true    Ran Out of Food in the Last Year: Sometimes true  Transportation Needs: Patient Declined (06/22/2023)   PRAPARE - Administrator, Civil Service (Medical): Patient declined    Lack of Transportation (Non-Medical): Patient declined  Physical Activity: Not on file  Stress: Not on file  Social Connections: Not on file   Sleep: Good  Appetite:  Fair  Current Medications: Current Facility-Administered Medications  Medication Dose Route Frequency Provider Last Rate Last Admin   acetaminophen (TYLENOL) tablet 650 mg  650 mg Oral Q6H PRN Jearld Lesch, NP   650 mg at 06/25/23 1840   albuterol (VENTOLIN HFA) 108 (90 Base) MCG/ACT inhaler 2 puff  2 puff Inhalation Q6H PRN Ntuen, Jesusita Oka, FNP       alum & mag hydroxide-simeth (MAALOX/MYLANTA) 200-200-20 MG/5ML suspension 30 mL  30 mL Oral Q4H PRN Dixon, Rashaun M, NP       dicyclomine (BENTYL) capsule 10 mg  10 mg Oral BID Starleen Blue, NP   10 mg at 06/26/23 1640   diphenhydrAMINE (BENADRYL) capsule 50 mg  50 mg Oral TID PRN Jearld Lesch, NP       Or   diphenhydrAMINE (BENADRYL) injection 50  mg  50 mg Intramuscular TID PRN Dixon, Rashaun M, NP       feeding supplement (ENSURE ENLIVE / ENSURE PLUS) liquid 237 mL  237 mL Oral BID BM Ntuen, Tina C, FNP   237 mL at 06/24/23 1539   haloperidol (HALDOL) tablet 5 mg  5 mg Oral TID PRN Jearld Lesch, NP       Or   haloperidol lactate (HALDOL) injection 5 mg  5 mg Intramuscular TID PRN Jearld Lesch, NP       hydrOXYzine (ATARAX) tablet 25 mg  25 mg Oral TID PRN Jearld Lesch, NP       ibuprofen (ADVIL) tablet 600 mg  600 mg Oral Once PRN Ajibola, Ene A, NP       loperamide (IMODIUM) capsule 2 mg  2 mg Oral TID PRN Starleen Blue, NP       LORazepam (ATIVAN) tablet 2 mg  2 mg Oral TID PRN Jearld Lesch, NP       Or   LORazepam (ATIVAN) injection 2 mg  2 mg Intramuscular TID PRN Durwin Nora, Rashaun M, NP       magnesium hydroxide (MILK OF MAGNESIA) suspension 30 mL  30 mL Oral Daily PRN Jearld Lesch, NP       multivitamin with minerals tablet 1 tablet  1 tablet Oral Daily Starleen Blue, NP   1 tablet at 06/26/23 0829   ondansetron (ZOFRAN-ODT) disintegrating tablet 4 mg  4 mg Oral Q6H PRN Starleen Blue, NP       QUEtiapine (SEROQUEL) tablet 100 mg  100 mg Oral QHS Starleen Blue, NP   100 mg at 06/25/23 2015   sertraline (ZOLOFT) tablet 100 mg  100 mg Oral Daily Starleen Blue, NP   100 mg at 06/26/23 6073   traZODone (DESYREL) tablet 50 mg  50 mg Oral QHS PRN Jearld Lesch, NP       vitamin D3 (CHOLECALCIFEROL) tablet 1,000 Units  1,000 Units Oral Daily Starleen Blue, NP   1,000 Units at 06/26/23 0830    Lab Results:  Results for orders placed or performed during the hospital encounter of 06/22/23 (from the past 48 hour(s))  Basic metabolic panel     Status: Abnormal   Collection Time: 06/25/23  6:31 AM  Result Value Ref Range   Sodium 139 135 - 145 mmol/L   Potassium 4.2 3.5 - 5.1 mmol/L   Chloride 106 98 - 111 mmol/L   CO2 26 22 - 32 mmol/L   Glucose, Bld 105 (H) 70 - 99 mg/dL    Comment: Glucose reference range  applies only to samples taken after fasting for at least 8 hours.   BUN 15 6 - 20 mg/dL   Creatinine, Ser 7.10 0.44 - 1.00 mg/dL   Calcium 9.2 8.9 - 62.6 mg/dL   GFR, Estimated >94 >85 mL/min    Comment: (NOTE) Calculated using the CKD-EPI Creatinine Equation (2021)    Anion gap 7 5 - 15    Comment: Performed at [facility], [address], [place], [place] [address]  Lipid panel     Status: Abnormal   Collection Time: 06/25/23  6:31 AM  Result Value Ref Range   Cholesterol 194 0 - 200 mg/dL   Triglycerides 74 <350 mg/dL   HDL 57 >09 mg/dL   Total CHOL/HDL Ratio 3.4 RATIO   VLDL 15 0 - 40 mg/dL   LDL Cholesterol 381 (H) 0 - 99 mg/dL    Comment:        Total Cholesterol/HDL:CHD Risk Coronary Heart Disease Risk Table                     Men   Women  1/2 Average Risk   3.4   3.3  Average Risk       5.0   4.4  2 X Average Risk   9.6   7.1  3 X Average Risk  23.4   11.0        Use the calculated Patient Ratio above and the CHD Risk Table to determine the patient's CHD Risk.        ATP III CLASSIFICATION (  LDL):  <100     mg/dL   Optimal  161-096  mg/dL   Near or Above                    Optimal  130-159  mg/dL   Borderline  045-409  mg/dL   High  >811     mg/dL   Very High Performed at [facility], [address] [place], [place] [address]   TSH     Status: None   Collection Time: 06/25/23  6:31 AM  Result Value Ref Range   TSH 3.186 0.350 - 4.500 uIU/mL    Comment: Performed by a 3rd Generation assay with a functional sensitivity of <=0.01 uIU/mL. Performed at [facility], [address], [place], [place] [address]   VITAMIN D 25 Hydroxy (Vit-D Deficiency, Fractures)     Status: Abnormal   Collection Time: 06/25/23  6:31 AM  Result Value Ref Range   Vit D, 25-Hydroxy 23.73 (L) 30 - 100 ng/mL    Comment: (NOTE) Vitamin D deficiency has been defined by the Institute of Medicine  and an Endocrine Society  practice guideline as a level of serum 25-OH  vitamin D less than 20 ng/mL (1,2). The Endocrine Society went on to  further define vitamin D insufficiency as a level between 21 and 29  ng/mL (2).  1. IOM (Institute of Medicine). 2010. Dietary reference intakes for  calcium and D. [place]: [employer]. 2. Holick MF, Binkley [place], Bischoff-Ferrari HA, et al. Evaluation,  treatment, and prevention of vitamin D deficiency: an Endocrine  Society clinical practice guideline, JCEM. 2011 Jul; 96(7): 1911-30.  Performed at [facility], [address], [place], [place] [address]   Hemoglobin A1c     Status: None   Collection Time: 06/25/23  6:31 AM  Result Value Ref Range   Hgb A1c MFr Bld 5.6 4.8 - 5.6 %    Comment: (NOTE)         Prediabetes: 5.7 - 6.4         Diabetes: >6.4         Glycemic control for adults with diabetes: <7.0    Mean Plasma Glucose 114 mg/dL    Comment: (NOTE) Performed At: [facility] [address] [place], [place] [id number] Jolene Schimke MD BM:[id number]   Hepatic function panel     Status: Abnormal   Collection Time: 06/26/23  6:42 AM  Result Value Ref Range   Total Protein 7.3 6.5 - 8.1 g/dL   Albumin 3.9 3.5 - 5.0 g/dL   AST 46 (H) 15 - 41 U/L   ALT 92 (H) 0 - 44 U/L   Alkaline Phosphatase 47 38 - 126 U/L   Total Bilirubin 0.6 0.3 - 1.2 mg/dL   Bilirubin, Direct <2.7 0.0 - 0.2 mg/dL   Indirect Bilirubin NOT CALCULATED 0.3 - 0.9 mg/dL    Comment: Performed at [facility], [address], [place], [place] [address]  Hepatitis panel, acute     Status: Abnormal   Collection Time: 06/26/23  6:42 AM  Result Value Ref Range   Hepatitis B Surface Ag NON REACTIVE NON REACTIVE   HCV Ab Reactive (A) NON REACTIVE    Comment: (NOTE) The CDC recommends that a Reactive HCV antibody result be followed up  with a HCV Nucleic Acid Amplification test.     Hep A IgM NON REACTIVE NON REACTIVE   Hep B  C IgM  NON REACTIVE NON REACTIVE    Comment: Performed at [facility], [address], [place], [place] [address]    Blood Alcohol level:  Lab Results  Component Value Date   [facility] <10 06/20/2023   ETH <10 05/06/2023    Metabolic Disorder Labs: Lab Results  Component Value Date   HGBA1C 5.6 06/25/2023   MPG 114 06/25/2023   MPG 105.41 08/06/2022   No results found for: "PROLACTIN" Lab Results  Component Value Date   CHOL 194 06/25/2023   TRIG 74 06/25/2023   HDL 57 06/25/2023   CHOLHDL 3.4 06/25/2023   VLDL 15 06/25/2023   LDLCALC 122 (H) 06/25/2023   LDLCALC 97 08/06/2022    Physical Findings: AIMS:  , ,  ,  ,    CIWA:  CIWA-Ar Total: 1 COWS:     Musculoskeletal: Strength & Muscle Tone: within normal limits Gait & Station: normal Patient leans: N/A  Psychiatric Specialty Exam:  Presentation  General Appearance:  Fairly Groomed  Eye Contact: Fair  Speech: Clear and Coherent  Speech Volume: Normal  Handedness: Right   Mood and Affect  Mood: Depressed; Anxious  Affect: Congruent   Thought Process  Thought Processes: Coherent  Descriptions of Associations:Intact  Orientation:Full (Time, Place and Person)  Thought Content:Logical  History of Schizophrenia/Schizoaffective disorder:No  Duration of Psychotic Symptoms:N/A  Hallucinations:Hallucinations: None  Ideas of Reference:None  Suicidal Thoughts:Suicidal Thoughts: No  Homicidal Thoughts:Homicidal Thoughts: No   Sensorium  Memory: Immediate Good  Judgment: Fair  Insight: Fair   Art therapist  Concentration: Fair  Attention Span: Fair  Recall: Fair  Fund of Knowledge: Fair  Language: Fair   Psychomotor Activity  Psychomotor Activity: Psychomotor Activity: Normal   Assets  Assets: Resilience  Sleep  Sleep: Sleep: Fair  Physical Exam: Physical Exam Constitutional:      Appearance: Normal appearance.  HENT:     Nose: Nose normal.   Eyes:     Pupils: Pupils are equal, round, and reactive to light.  Musculoskeletal:        General: Normal range of motion.     Cervical back: Normal range of motion.  Neurological:     Mental Status: She is alert.    Review of Systems  Constitutional:  Negative for fever.  HENT:  Negative for hearing loss.   Eyes:  Negative for blurred vision.  Respiratory:  Negative for cough.   Cardiovascular:  Negative for chest pain.  Gastrointestinal:  Negative for heartburn.  Genitourinary:  Negative for dysuria.  Musculoskeletal:  Negative for myalgias.  Neurological:  Negative for dizziness.  Psychiatric/Behavioral:  Positive for depression, substance abuse and suicidal ideas. Negative for hallucinations and memory loss. The patient is nervous/anxious and has insomnia.    Blood pressure 103/71, pulse 71, temperature 98.3 F (36.8 C), temperature source Oral, resp. rate 18, height 5\' 7"  (1.702 m), weight 61.5 kg, last menstrual period 05/20/2023, SpO2 98 %. Body mass index is 21.22 kg/m.  Treatment Plan Summary: Daily contact with patient to assess and evaluate symptoms and progress in treatment and Medication management.   Continue inpatient hospitalization.  Will continue today 06/26/2023 plan as below except where it is noted.   Diagnoses Principal Problem:   Severe bipolar I disorder, current or most recent episode depressed (HCC) Active Problems:   Anxiety disorder   Anxiety   Amphetamine use disorder, severe, dependence (HCC)   Mild benzodiazepine use disorder (HCC)   Opioid use with withdrawal (HCC)  Medications -discontinued Clonidine 0.1 mg BID for Opioid withdrawal symptoms -Continue Bentyl 10 mg BID for stomach cramps -Continue Robaxin 500mg  BID PRN for muscle pain -Continue Naproxen 500 mg BID PRN for pain -Continue Vitamin D 3 daily for low Vitamin D levels. -Continue Seroquel 100 mg po q hs for mood control. -Continue Zoloft 100 mg daily for MDD -Continue  hydroxyzine 25 mg po tid prn for anxiety. -Albuterol as needed for wheezing or shortness of breath  Agitation protocols: Cont as recommended;  -Benadryl 50 mg po or IM tid prn. -Haldol 5 mg po or IM tid prn.  -Lorazepam 2 mg po or IM tid prn.  -Previously Discontinued Klonopin 0.5 mg PRNs -Previously Discontinue Zyprexa IM/p.o. 10 mg as needed   PRNS -Continue trazodone 50 mg as needed nightly for sleep -Continue Tylenol 650 mg every 6 hours PRN for mild pain -Continue Maalox 30 mg every 4 hrs PRN for indigestion -Continue Milk of Magnesia as needed every 6 hrs for constipation  Discharge Planning: Social work and case management to assist with discharge planning and identification of hospital follow-up needs prior to discharge Estimated LOS: 5-7 days Discharge Concerns: Need to establish a safety plan; Medication compliance and effectiveness Discharge Goals: Return home with outpatient referrals for mental health follow-up including medication management/psychotherapy  I certify that inpatient services furnished can reasonably be expected to improve the patient's condition.    Armandina Stammer, NP, pmhnp, fnp-bc. 6/27/20244:58 PM   Patient ID: Taylor Wiggins, female   DOB: 05-04-1981, 42 y.o.   MRN: [id number] Patient ID: Taylor Wiggins, female   DOB: [birth year], 42 y.o.   MRN: [id number]

## 2023-06-26 NOTE — Group Note (Signed)
Date:  06/26/2023 Time:  12:12 PM  Group Topic/Focus:  Recovery Goals:   The focus of this group is to identify appropriate goals for recovery and establish a plan to achieve them. Stages of Change:   The focus of this group is to explain the stages of change and help patients identify changes they want to make upon discharge.    Participation Level:  Did Not Attend  Participation Quality:    Affect:    Cognitive:    Insight:   Engagement in Group:    Modes of Intervention:    Additional Comments:    Memory Dance Merrissa Giacobbe 06/26/2023, 12:12 PM

## 2023-06-26 NOTE — Progress Notes (Signed)
[facility] LCSW Progress Note   06/26/2023    3:22 PM  MAYLEY LISH   [id number]   Type of Contact and Topic:  [facility]  CSW spoke with patient and patient states that she lost the number to do the screener.  CSW provided patient number again and patient agreed to participate in screener.     Signed:  Anson [place] MSW, LCSW, LCAS 06/26/2023 3:22 PM

## 2023-06-26 NOTE — [facility] Group Notes (Signed)
[facility] Group Notes:  (Nursing/MHT/Case Management/Adjunct)  Date:  06/26/2023  Time:  9:13 PM  Type of Therapy:   Wrap-up group  Participation Level:  Did Not Attend  Participation Quality:    Affect:    Cognitive:    Insight:    Engagement in Group:    Modes of Intervention:    Summary of Progress/Problems: Pt didn't attend.  Taylor Wiggins 06/26/2023, 9:13 PM

## 2023-06-26 NOTE — Progress Notes (Signed)
   06/26/23 2044  Psych Admission Type (Psych Patients Only)  Admission Status Involuntary  Psychosocial Assessment  Patient Complaints None  Eye Contact Fair  Facial Expression Flat  Affect Appropriate to circumstance  Speech Logical/coherent  Interaction Assertive  Motor Activity Other (Comment) (WDL)  Appearance/Hygiene Unremarkable  Behavior Characteristics Cooperative;Appropriate to situation;Calm  Mood Pleasant  Thought Process  Coherency WDL  Content WDL  Delusions None reported or observed  Perception WDL  Hallucination None reported or observed  Judgment Poor  Confusion None  Danger to Self  Current suicidal ideation? Verbalizes  Description of Suicide Plan No plan  Self-Injurious Behavior No self-injurious ideation or behavior indicators observed or expressed   Agreement Not to Harm Self Yes  Description of Agreement verbal  Danger to Others  Danger to Others None reported or observed   D: Pt alert and oriented. Pt rates depression 0/10 and anxiety 0/10.  Pt denies experiencing any SI/HI, or AVH at this time.   A: Scheduled medications administered to pt, per MD orders. Support and encouragement provided. Frequent verbal contact made. Routine safety checks conducted q15 minutes.   R: No adverse drug reactions noted. Pt verbally contracts for safety at this time. Pt complaint with medications and treatment plan. Pt interacts well with others on the unit. Pt remains safe at this time. Will continue to monitor.

## 2023-06-26 NOTE — Progress Notes (Signed)
   06/26/23 1046  Psych Admission Type (Psych Patients Only)  Admission Status Involuntary  Psychosocial Assessment  Patient Complaints Anxiety;Depression  Eye Contact Fair  Facial Expression Flat  Affect Depressed;Anxious;Blunted  Speech Logical/coherent  Interaction Assertive;Guarded;Intrusive  Motor Activity Slow  Appearance/Hygiene Unremarkable  Behavior Characteristics Cooperative;Calm  Mood Pleasant  Thought Process  Coherency WDL  Delusions None reported or observed  Perception WDL  Hallucination None reported or observed  Judgment Poor  Confusion None  Danger to Self  Current suicidal ideation? Active  Description of Suicide Plan no plan  Self-Injurious Behavior No self-injurious ideation or behavior indicators observed or expressed   Agreement Not to Harm Self Yes  Description of Agreement verbal  Danger to Others  Danger to Others None reported or observed

## 2023-06-27 ENCOUNTER — Encounter (HOSPITAL_COMMUNITY): Payer: Self-pay

## 2023-06-27 DIAGNOSIS — F314 Bipolar disorder, current episode depressed, severe, without psychotic features: Secondary | ICD-10-CM | POA: Diagnosis not present

## 2023-06-27 NOTE — [facility] Group Notes (Signed)
[facility] Group Notes:  (Nursing/MHT/Case Management/Adjunct)  Date:  06/27/2023  Time:  8:11 PM  Type of Therapy:   AA group  Participation Level:  Active  Participation Quality:  Appropriate  Affect:  Appropriate  Cognitive:  Appropriate  Insight:  Appropriate  Engagement in Group:  Engaged  Modes of Intervention:  Education  Summary of Progress/Problems: Attended AA meeting.  Noah Delaine 06/27/2023, 8:11 PM

## 2023-06-27 NOTE — Group Note (Signed)
Recreation Therapy Group Note   Group Topic:Team Building  Group Date: 06/27/2023 Start Time: 0940 End Time: 1012 Facilitators: Taylor Wiggins, LRT,CTRS Location: [address] [employer]   Recreation Therapy Notes  Goal Area(s) Addresses:  Patient will effectively work with peer towards shared goal.  Patient will identify skills used to make activity successful.  Patient will identify how skills used during activity can be applied to reach post d/c goals.    Group Description: [employer]. In teams of 5-6, patients were given 11 craft pipe cleaners. Using the materials provided, patients were instructed to compete again the opposing team(s) to build the tallest free-standing structure from floor level. The activity was timed; difficulty increased by Clinical research associate as Production designer, theatre/television/film continued.  Systematically resources were removed with additional directions for example, placing one arm behind their back, working in silence, and shape stipulations. LRT facilitated post-activity discussion reviewing team processes and necessary communication skills involved in completion. Patients were encouraged to reflect how the skills utilized, or not utilized, in this activity can be incorporated to positively impact support systems post discharge.   Affect/Mood: N/A   Participation Level: Did not attend    Clinical Observations/Individualized Feedback:     Plan: Continue to engage patient in RT group sessions 2-3x/week.   Taylor Wiggins, LRT,CTRS  06/27/2023 11:10 AM

## 2023-06-27 NOTE — Progress Notes (Signed)
[facility] MD Progress Note  06/27/2023 3:28 PM Taylor Wiggins  MRN:  [id number] Principal Problem: Severe bipolar I disorder, current or most recent episode depressed (HCC) Diagnosis: Principal Problem:   Severe bipolar I disorder, current or most recent episode depressed (HCC) Active Problems:   Anxiety disorder   Anxiety   Amphetamine use disorder, severe, dependence (HCC)   Mild benzodiazepine use disorder (HCC)   Opioid use with withdrawal (HCC)  Reason for Admission: Taylor Wiggins. [name] is a 42 yo CF who presented to the [facility] [place] with c/o suicidal ideations in the context of substance abuse and altercation with her mother and husband.Pt was transferred to this hospital under IVC status for treatment and stabilization of her mental status.   24 hr chart review: Sleep Hours last night: Good as per patient  Nursing Concerns: None Behavioral episodes in the past 24 hrs: None Medication Compliance: Compliant Vital Signs in the past 24 hrs: Within normal limits PRN Medications in the past 24 hrs: None overnight  Patient assessment note:Pt presents today with a significantly improved mood as compared to time of admission.  She is continuing to report motivation to go to rehab treatment for substance abuse.  She states that her assigned CSW has worked to get her into [facility] treatment center, for an admission date of Monday, July 1st for substance abuse treatment.  Patient is projected to be discharged on 7/1 as well.  Her attention to personal hygiene and grooming is fair, eye contact is good, speech is clear & coherent. Thought contents are organized and logical, and pt currently denies SI/HI/AVH or paranoia. There is no evidence of delusional thoughts.    Patient reports a good sleep quality last night, she reports that her appetite is fair but improving, denies being in any physical pain today.  Denies medication related side effects, reports that she is moving her bowels  well, states that all of her withdrawal symptoms have improved with the exception of cold chills.   Labs reviewed: LFTs are elevated.  Ordered hepatitis panel test due to this.  Hepatitis C virus reactive.  Total Time spent with patient: 45 minutes  Past Psychiatric History: See H & P  Past Medical History:  Past Medical History:  Diagnosis Date   Adult ADHD    Anxiety    Asthma    Hepatitis C    IBS (irritable bowel syndrome)    Opioid use disorder    No longer on buprenorphine therapy   PTSD (post-traumatic stress disorder)     Past Surgical History:  Procedure Laterality Date   BREAST REDUCTION SURGERY  2005   Family History:  Family History  Problem Relation Age of Onset   Anxiety disorder Mother    Diabetes Mother    Anxiety disorder Father    Family Psychiatric  History: See H & P Social History:  Social History   Substance and Sexual Activity  Alcohol Use No     Social History   Substance and Sexual Activity  Drug Use Not Currently   Types: Methamphetamines   Comment: no methamphetamine use in 30 days    Social History   Socioeconomic History   Marital status: Legally Separated    Spouse name: Not on file   Number of children: Not on file   Years of education: Not on file   Highest education level: Not on file  Occupational History   Not on file  Tobacco Use   Smoking  status: Every Day    Packs/day: .5    Types: Cigarettes    Last attempt to quit: 09/06/2014    Years since quitting: 8.8   Smokeless tobacco: Never  Vaping Use   Vaping Use: Every day  Substance and Sexual Activity   Alcohol use: No   Drug use: Not Currently    Types: Methamphetamines    Comment: no methamphetamine use in 30 days   Sexual activity: Not Currently    Birth control/protection: Abstinence  Other Topics Concern   Not on file  Social History Narrative   42 y/o Caucasian female. Married and homeless with polysubstance abuse. Per pt-Graduated from [employer] in  2006 with bachelors degree in Business Administration.      04/24/23- Patient reported separated from husband but not legally at this time. Pt states she is going through several hardships this year and is currently taking care of mother and using address at this time. Requests information on resources for when she is no longer with mother.    Social Determinants of Health   Financial Resource Strain: Not on file  Food Insecurity: Patient Declined (06/22/2023)   Hunger Vital Sign    Worried About Running Out of Food in the Last Year: Patient declined    Ran Out of Food in the Last Year: Patient declined  Recent Concern: Food Insecurity - Food Insecurity Present (04/24/2023)   Hunger Vital Sign    Worried About Running Out of Food in the Last Year: Sometimes true    Ran Out of Food in the Last Year: Sometimes true  Transportation Needs: Patient Declined (06/22/2023)   PRAPARE - Administrator, Civil Service (Medical): Patient declined    Lack of Transportation (Non-Medical): Patient declined  Physical Activity: Not on file  Stress: Not on file  Social Connections: Not on file   Sleep: Poor  Appetite:  Poor  Current Medications: Current Facility-Administered Medications  Medication Dose Route Frequency Provider Last Rate Last Admin   acetaminophen (TYLENOL) tablet 650 mg  650 mg Oral Q6H PRN Jearld Lesch, NP   650 mg at 06/25/23 1840   albuterol (VENTOLIN HFA) 108 (90 Base) MCG/ACT inhaler 2 puff  2 puff Inhalation Q6H PRN Ntuen, Jesusita Oka, FNP       alum & mag hydroxide-simeth (MAALOX/MYLANTA) 200-200-20 MG/5ML suspension 30 mL  30 mL Oral Q4H PRN Dixon, Rashaun M, NP       dicyclomine (BENTYL) capsule 10 mg  10 mg Oral BID Starleen Blue, NP   10 mg at 06/27/23 0836   diphenhydrAMINE (BENADRYL) capsule 50 mg  50 mg Oral TID PRN Jearld Lesch, NP       Or   diphenhydrAMINE (BENADRYL) injection 50 mg  50 mg Intramuscular TID PRN Dixon, Rashaun M, NP       feeding  supplement (ENSURE ENLIVE / ENSURE PLUS) liquid 237 mL  237 mL Oral BID BM Ntuen, Tina C, FNP   237 mL at 06/27/23 0837   haloperidol (HALDOL) tablet 5 mg  5 mg Oral TID PRN Jearld Lesch, NP       Or   haloperidol lactate (HALDOL) injection 5 mg  5 mg Intramuscular TID PRN Jearld Lesch, NP       hydrOXYzine (ATARAX) tablet 25 mg  25 mg Oral TID PRN Jearld Lesch, NP       loperamide (IMODIUM) capsule 2 mg  2 mg Oral TID PRN Starleen Blue, NP  LORazepam (ATIVAN) tablet 2 mg  2 mg Oral TID PRN Jearld Lesch, NP       Or   LORazepam (ATIVAN) injection 2 mg  2 mg Intramuscular TID PRN Durwin Nora, Rashaun M, NP       magnesium hydroxide (MILK OF MAGNESIA) suspension 30 mL  30 mL Oral Daily PRN Jearld Lesch, NP       multivitamin with minerals tablet 1 tablet  1 tablet Oral Daily Starleen Blue, NP   1 tablet at 06/27/23 1610   QUEtiapine (SEROQUEL) tablet 100 mg  100 mg Oral QHS Starleen Blue, NP   100 mg at 06/26/23 2007   sertraline (ZOLOFT) tablet 100 mg  100 mg Oral Daily Starleen Blue, NP   100 mg at 06/27/23 0836   traZODone (DESYREL) tablet 50 mg  50 mg Oral QHS PRN Jearld Lesch, NP       vitamin D3 (CHOLECALCIFEROL) tablet 1,000 Units  1,000 Units Oral Daily Starleen Blue, NP   1,000 Units at 06/27/23 9604    Lab Results:  Results for orders placed or performed during the hospital encounter of 06/22/23 (from the past 48 hour(s))  Hepatic function panel     Status: Abnormal   Collection Time: 06/26/23  6:42 AM  Result Value Ref Range   Total Protein 7.3 6.5 - 8.1 g/dL   Albumin 3.9 3.5 - 5.0 g/dL   AST 46 (H) 15 - 41 U/L   ALT 92 (H) 0 - 44 U/L   Alkaline Phosphatase 47 38 - 126 U/L   Total Bilirubin 0.6 0.3 - 1.2 mg/dL   Bilirubin, Direct <5.4 0.0 - 0.2 mg/dL   Indirect Bilirubin NOT CALCULATED 0.3 - 0.9 mg/dL    Comment: Performed at [facility], [address], [place], [place] [address]  Hepatitis panel, acute     Status: Abnormal    Collection Time: 06/26/23  6:42 AM  Result Value Ref Range   Hepatitis B Surface Ag NON REACTIVE NON REACTIVE   HCV Ab Reactive (A) NON REACTIVE    Comment: (NOTE) The CDC recommends that a Reactive HCV antibody result be followed up  with a HCV Nucleic Acid Amplification test.     Hep A IgM NON REACTIVE NON REACTIVE   Hep B C IgM NON REACTIVE NON REACTIVE    Comment: Performed at [facility], [address] [place], [place] [address]    Blood Alcohol level:  Lab Results  Component Value Date   [facility] <10 06/20/2023   ETH <10 05/06/2023    Metabolic Disorder Labs: Lab Results  Component Value Date   HGBA1C 5.6 06/25/2023   MPG 114 06/25/2023   MPG 105.41 08/06/2022   No results found for: "PROLACTIN" Lab Results  Component Value Date   CHOL 194 06/25/2023   TRIG 74 06/25/2023   HDL 57 06/25/2023   CHOLHDL 3.4 06/25/2023   VLDL 15 06/25/2023   LDLCALC 122 (H) 06/25/2023   LDLCALC 97 08/06/2022    Physical Findings: AIMS:  , ,  ,  ,    CIWA:  CIWA-Ar Total: 1 COWS:     Musculoskeletal: Strength & Muscle Tone: within normal limits Gait & Station: normal Patient leans: N/A  Psychiatric Specialty Exam:  Presentation  General Appearance:  Appropriate for Environment; Fairly Groomed  Eye Contact: Fair  Speech: Clear and Coherent  Speech Volume: Normal  Handedness: Right   Mood and Affect  Mood: Depressed  Affect: Congruent  Thought Process  Thought Processes: Coherent  Descriptions of Associations:Intact  Orientation:Full (Time, Place and Person)  Thought Content:Logical  History of Schizophrenia/Schizoaffective disorder:No  Duration of Psychotic Symptoms:N/A  Hallucinations:Hallucinations: None   Ideas of Reference:None  Suicidal Thoughts:Suicidal Thoughts: No   Homicidal Thoughts:Homicidal Thoughts: No    Sensorium  Memory: Immediate Good  Judgment: Fair  Insight: Fair   Executive Functions   Concentration: Good  Attention Span: Good  Recall: Fair  Fund of Knowledge: Fair  Language: Good   Psychomotor Activity  Psychomotor Activity: Psychomotor Activity: Normal    Assets  Assets: Communication Skills; Resilience  Sleep  Sleep: Sleep: Good   Physical Exam: Physical Exam Constitutional:      Appearance: Normal appearance.  HENT:     Nose: Nose normal.  Eyes:     Pupils: Pupils are equal, round, and reactive to light.  Musculoskeletal:        General: Normal range of motion.     Cervical back: Normal range of motion.  Neurological:     Mental Status: She is alert.    Review of Systems  Constitutional:  Negative for fever.  HENT:  Negative for hearing loss.   Eyes:  Negative for blurred vision.  Respiratory:  Negative for cough.   Cardiovascular:  Negative for chest pain.  Gastrointestinal:  Negative for heartburn.  Genitourinary:  Negative for dysuria.  Musculoskeletal:  Negative for myalgias.  Neurological:  Negative for dizziness.  Psychiatric/Behavioral:  Positive for depression and substance abuse. Negative for hallucinations, memory loss and suicidal ideas. The patient is nervous/anxious and has insomnia.    Blood pressure 103/81, pulse 95, temperature 98.3 F (36.8 C), temperature source Oral, resp. rate 16, height 5\' 7"  (1.702 m), weight 61.5 kg, last menstrual period 05/20/2023, SpO2 98 %. Body mass index is 21.22 kg/m.   Treatment Plan Summary: Daily contact with patient to assess and evaluate symptoms and progress in treatment and Medication management  Safety and Monitoring: Voluntary admission to inpatient psychiatric unit for safety, stabilization and treatment Daily contact with patient to assess and evaluate symptoms and progress in treatment Patient's case to be discussed in multi-disciplinary team meeting Observation Level : q15 minute checks Vital signs: q12 hours Precautions: Safety  Long Term Goal(s): Improvement  in symptoms so as ready for discharge  Short Term Goals: Ability to identify changes in lifestyle to reduce recurrence of condition will improve, Ability to disclose and discuss suicidal ideas, Ability to identify and develop effective coping behaviors will improve, Ability to maintain clinical measurements within normal limits will improve, Compliance with prescribed medications will improve, and Ability to identify triggers associated with substance abuse/mental health issues will improve  Diagnoses Principal Problem:   Severe bipolar I disorder, current or most recent episode depressed (HCC) Active Problems:   Anxiety disorder   Anxiety   Amphetamine use disorder, severe, dependence (HCC)   Mild benzodiazepine use disorder (HCC)   Opioid use with withdrawal (HCC)  Medications -Continue Bentyl 10 mg BID for stomach cramps -Continue Vitamin D 3 daily for low Vitamin D levels. -Continue Seroquel 100 mg nightly for mood stabilization and sleep -Continue Zoloft 100 mg daily for MDD -Continue Ensure Nutritional shakes for nutritional suupplementation -Continue hydroxyzine 25 mg 3 times daily as needed for anxiety -Albuterol as needed for wheezing or shortness of breath  -Continue agitation protocol medications: Ativan/Benadryl/Haldol as needed 3 times daily for agitation -Previously Discontinued Klonopin 0.5 mg PRNs -Previously Discontinue Zyprexa IM/p.o. 10 mg as needed  PRNS -Continue trazodone 50 mg as needed nightly for sleep -Continue Tylenol 650 mg every 6 hours PRN for mild pain -Continue Maalox 30 mg every 4 hrs PRN for indigestion -Continue Milk of Magnesia as needed every 6 hrs for constipation  Discharge Planning: Social work and case management to assist with discharge planning and identification of hospital follow-up needs prior to discharge Estimated LOS: 5-7 days Discharge Concerns: Need to establish a safety plan; Medication compliance and effectiveness Discharge  Goals: Return home with outpatient referrals for mental health follow-up including medication management/psychotherapy  I certify that inpatient services furnished can reasonably be expected to improve the patient's condition.    Starleen Blue, NP 6/28/20243:28 PM    Patient ID: KESHAY VELEY, female   DOB: [birth year], 42 y.o.   MRN: [id number] Patient ID: ZORIANA LIMBAUGH, female   DOB: [birth year], 42 y.o.   MRN: [id number]

## 2023-06-27 NOTE — BH IP Treatment Plan (Signed)
Interdisciplinary Treatment and Diagnostic Plan Update  06/27/2023 Time of Session: [address] [name] MRN: [id number]  Principal Diagnosis: Severe bipolar I disorder, current or most recent episode depressed (HCC)  Secondary Diagnoses: Principal Problem:   Severe bipolar I disorder, current or most recent episode depressed (HCC) Active Problems:   Anxiety disorder   Anxiety   Amphetamine use disorder, severe, dependence (HCC)   Mild benzodiazepine use disorder (HCC)   Opioid use with withdrawal (HCC)   Current Medications:  Current Facility-Administered Medications  Medication Dose Route Frequency Provider Last Rate Last Admin   acetaminophen (TYLENOL) tablet 650 mg  650 mg Oral Q6H PRN Jearld Lesch, NP   650 mg at 06/25/23 1840   albuterol (VENTOLIN HFA) 108 (90 Base) MCG/ACT inhaler 2 puff  2 puff Inhalation Q6H PRN Ntuen, Jesusita Oka, FNP       alum & mag hydroxide-simeth (MAALOX/MYLANTA) 200-200-20 MG/5ML suspension 30 mL  30 mL Oral Q4H PRN Dixon, Rashaun M, NP       dicyclomine (BENTYL) capsule 10 mg  10 mg Oral BID Starleen Blue, NP   10 mg at 06/27/23 4098   diphenhydrAMINE (BENADRYL) capsule 50 mg  50 mg Oral TID PRN Jearld Lesch, NP       Or   diphenhydrAMINE (BENADRYL) injection 50 mg  50 mg Intramuscular TID PRN Dixon, Rashaun M, NP       feeding supplement (ENSURE ENLIVE / ENSURE PLUS) liquid 237 mL  237 mL Oral BID BM Ntuen, Tina C, FNP   237 mL at 06/27/23 0837   haloperidol (HALDOL) tablet 5 mg  5 mg Oral TID PRN Jearld Lesch, NP       Or   haloperidol lactate (HALDOL) injection 5 mg  5 mg Intramuscular TID PRN Jearld Lesch, NP       hydrOXYzine (ATARAX) tablet 25 mg  25 mg Oral TID PRN Jearld Lesch, NP       loperamide (IMODIUM) capsule 2 mg  2 mg Oral TID PRN Starleen Blue, NP       LORazepam (ATIVAN) tablet 2 mg  2 mg Oral TID PRN Jearld Lesch, NP       Or   LORazepam (ATIVAN) injection 2 mg  2 mg Intramuscular TID PRN Dixon, Rashaun M, NP        magnesium hydroxide (MILK OF MAGNESIA) suspension 30 mL  30 mL Oral Daily PRN Jearld Lesch, NP       multivitamin with minerals tablet 1 tablet  1 tablet Oral Daily Nkwenti, Doris, NP   1 tablet at 06/27/23 0836   ondansetron (ZOFRAN-ODT) disintegrating tablet 4 mg  4 mg Oral Q6H PRN Starleen Blue, NP       QUEtiapine (SEROQUEL) tablet 100 mg  100 mg Oral QHS Nkwenti, Doris, NP   100 mg at 06/26/23 2007   sertraline (ZOLOFT) tablet 100 mg  100 mg Oral Daily Starleen Blue, NP   100 mg at 06/27/23 0836   traZODone (DESYREL) tablet 50 mg  50 mg Oral QHS PRN Jearld Lesch, NP       vitamin D3 (CHOLECALCIFEROL) tablet 1,000 Units  1,000 Units Oral Daily Starleen Blue, NP   1,000 Units at 06/27/23 1191   PTA Medications: Medications Prior to Admission  Medication Sig Dispense Refill Last Dose   Ascorbic Acid (VITAMIN C PO) Take 1 tablet by mouth daily with breakfast. (Patient not taking: Reported on 06/21/2023)  aspirin EC 325 MG tablet Take 325 mg by mouth daily as needed for mild pain (or headaches).      clonazePAM (KLONOPIN) 1 MG tablet Take 1 mg by mouth See admin instructions. Take 1 mg by mouth at bedtime and an additional 1 mg once a day as needed for anxiety (Patient not taking: Reported on 06/21/2023)      Cyanocobalamin (VITAMIN B-12 PO) Take 1 tablet by mouth daily with breakfast. (Patient not taking: Reported on 06/21/2023)      Omega-3 Fatty Acids (OMEGA-3 FISH OIL PO) Take 1 capsule by mouth daily with breakfast. (Patient not taking: Reported on 06/21/2023)       Patient Stressors: Marital or family conflict   Substance abuse    Patient Strengths: Average or above average intelligence  Capable of independent living  Motivation for treatment/growth  Physical Health  Supportive family/friends   Treatment Modalities: Medication Management, Group therapy, Case management,  1 to 1 session with clinician, Psychoeducation, Recreational therapy.   Physician Treatment  Plan for Primary Diagnosis: Severe bipolar I disorder, current or most recent episode depressed (HCC) Long Term Goal(s): Improvement in symptoms so as ready for discharge   Short Term Goals: Ability to identify changes in lifestyle to reduce recurrence of condition will improve Ability to disclose and discuss suicidal ideas Ability to identify and develop effective coping behaviors will improve Ability to maintain clinical measurements within normal limits will improve Compliance with prescribed medications will improve Ability to identify triggers associated with substance abuse/mental health issues will improve  Medication Management: Evaluate patient's response, side effects, and tolerance of medication regimen.  Therapeutic Interventions: 1 to 1 sessions, Unit Group sessions and Medication administration.  Evaluation of Outcomes: Progressing  Physician Treatment Plan for Secondary Diagnosis: Principal Problem:   Severe bipolar I disorder, current or most recent episode depressed (HCC) Active Problems:   Anxiety disorder   Anxiety   Amphetamine use disorder, severe, dependence (HCC)   Mild benzodiazepine use disorder (HCC)   Opioid use with withdrawal (HCC)  Long Term Goal(s): Improvement in symptoms so as ready for discharge   Short Term Goals: Ability to identify changes in lifestyle to reduce recurrence of condition will improve Ability to disclose and discuss suicidal ideas Ability to identify and develop effective coping behaviors will improve Ability to maintain clinical measurements within normal limits will improve Compliance with prescribed medications will improve Ability to identify triggers associated with substance abuse/mental health issues will improve     Medication Management: Evaluate patient's response, side effects, and tolerance of medication regimen.  Therapeutic Interventions: 1 to 1 sessions, Unit Group sessions and Medication  administration.  Evaluation of Outcomes: Progressing   RN Treatment Plan for Primary Diagnosis: Severe bipolar I disorder, current or most recent episode depressed (HCC) Long Term Goal(s): Knowledge of disease and therapeutic regimen to maintain health will improve  Short Term Goals: Ability to remain free from injury will improve, Ability to verbalize frustration and anger appropriately will improve, Ability to demonstrate self-control, Ability to participate in decision making will improve, Ability to verbalize feelings will improve, Ability to disclose and discuss suicidal ideas, Ability to identify and develop effective coping behaviors will improve, and Compliance with prescribed medications will improve  Medication Management: RN will administer medications as ordered by provider, will assess and evaluate patient's response and provide education to patient for prescribed medication. RN will report any adverse and/or side effects to prescribing provider.  Therapeutic Interventions: 1 on 1 counseling sessions, Psychoeducation,  Medication administration, Evaluate responses to treatment, Monitor vital signs and CBGs as ordered, Perform/monitor CIWA, COWS, AIMS and Fall Risk screenings as ordered, Perform wound care treatments as ordered.  Evaluation of Outcomes: Progressing   LCSW Treatment Plan for Primary Diagnosis: Severe bipolar I disorder, current or most recent episode depressed (HCC) Long Term Goal(s): Safe transition to appropriate next level of care at discharge, Engage patient in therapeutic group addressing interpersonal concerns.  Short Term Goals: Engage patient in aftercare planning with referrals and resources, Increase social support, Increase ability to appropriately verbalize feelings, Increase emotional regulation, Facilitate acceptance of mental health diagnosis and concerns, Facilitate patient progression through stages of change regarding substance use diagnoses and  concerns, Identify triggers associated with mental health/substance abuse issues, and Increase skills for wellness and recovery  Therapeutic Interventions: Assess for all discharge needs, 1 to 1 time with Social worker, Explore available resources and support systems, Assess for adequacy in community support network, Educate family and significant other(s) on suicide prevention, Complete Psychosocial Assessment, Interpersonal group therapy.  Evaluation of Outcomes: Progressing   Progress in Treatment: Attending groups: Yes. Participating in groups: Yes. Taking medication as prescribed: Yes. Toleration medication: Yes. Family/Significant other contact made: No, will contact:  Declined consents Patient understands diagnosis: Yes. Discussing patient identified problems/goals with staff: Yes. Medical problems stabilized or resolved: Yes. Denies suicidal/homicidal ideation: Yes. Issues/concerns per patient self-inventory: Yes. Other: N/A  New problem(s) identified: No, Describe:  None reported  New Short Term/Long Term Goal(s): medication stabilization, elimination of SI thoughts, development of comprehensive mental wellness plan.   Patient Goals: Medication Stabilization    Discharge Plan or Barriers: Pt is awaiting acceptance to a Substance use Tx facility. CSW will continue to follow and assess for appropriate referrals and possible discharge planning.   Reason for Continuation of Hospitalization: Anxiety Depression Medication stabilization Suicidal ideation Withdrawal symptoms  Estimated Length of Stay: 3-7 Days  Last 3 [place] Suicide Severity Risk Score: Flowsheet Row Admission (Current) [facility] from 06[facility]20/2023 in [facility] ED from 05/18/2023 in [facility]  C-SSRS RISK CATEGORY Moderate Risk High Risk No Risk       Last PHQ 2/9  Scores:    12/05/2022    2:54 PM 07/19/2022    8:06 PM 07/06/2022    9:49 AM  Depression screen PHQ 2/9  Decreased Interest 0 2 2  Down, Depressed, Hopeless 0 3 1  PHQ - 2 Score 0 5 3  Altered sleeping  2 2  Tired, decreased energy  2 2  Change in appetite  1 1  Feeling bad or failure about yourself   3 2  Trouble concentrating  2 1  Moving slowly or fidgety/restless  0 0  Suicidal thoughts  1 1  PHQ-9 Score  16 12  Difficult doing work/chores  Extremely dIfficult Somewhat difficult   detox, medication management for mood stabilization; elimination of SI thoughts; development of comprehensive mental wellness/sobriety plan  .   Scribe for Treatment Team: Ane Payment, LCSW 06/27/2023 9:03 AM

## 2023-06-27 NOTE — Progress Notes (Signed)
   06/27/23 0800  Psych Admission Type (Psych Patients Only)  Admission Status Involuntary  Psychosocial Assessment  Patient Complaints None  Eye Contact Fair  Facial Expression Flat  Affect Appropriate to circumstance  Speech Logical/coherent  Interaction Assertive;Other (Comment)  Motor Activity Other (Comment) (WDL)  Appearance/Hygiene Unremarkable  Behavior Characteristics Cooperative;Appropriate to situation  Mood Pleasant  Thought Process  Coherency WDL  Content WDL  Delusions None reported or observed  Perception WDL  Hallucination None reported or observed  Judgment Poor  Confusion None  Danger to Self  Current suicidal ideation? Verbalizes  Description of Suicide Plan No plan  Self-Injurious Behavior No self-injurious ideation or behavior indicators observed or expressed   Agreement Not to Harm Self Yes  Description of Agreement Verbal

## 2023-06-28 DIAGNOSIS — F314 Bipolar disorder, current episode depressed, severe, without psychotic features: Secondary | ICD-10-CM | POA: Diagnosis not present

## 2023-06-28 NOTE — Group Note (Signed)
Date:  06/28/2023 Time:  5:50 PM  Group Topic/Focus:  Goals Group:   The focus of this group is to help patients establish daily goals to achieve during treatment and discuss how the patient can incorporate goal setting into their daily lives to aide in recovery. Orientation:   The focus of this group is to educate the patient on the purpose and policies of crisis stabilization and provide a format to answer questions about their admission.  The group details unit policies and expectations of patients while admitted.    Participation Level:  Did Not Attend  Participation Quality:   n/a  Affect:   n/a  Cognitive:   n/a  Insight: None  Engagement in Group:   n/a  Modes of Intervention:   n/a  Additional Comments:   Pt did not attend  Taylor Wiggins 06/28/2023, 5:50 PM

## 2023-06-28 NOTE — Group Note (Signed)
Date:  06/28/2023 Time:  6:35 PM  Group Topic/Focus:  Dimensions of Wellness:   The focus of this group is to introduce the topic of wellness and discuss the role each dimension of wellness plays in total health.    Participation Level:  Did Not Attend  Participation Quality:   n/a  Affect:   n/a  Cognitive:   n/a  Insight: None  Engagement in Group:   n/a  Modes of Intervention:   n/a  Additional Comments:   Pt did not attend.  Edmund Hilda Izella Ybanez 06/28/2023, 6:35 PM

## 2023-06-28 NOTE — Plan of Care (Signed)
  Problem: Health Behavior/Discharge Planning: Goal: Ability to identify changes in lifestyle to reduce recurrence of condition will improve Outcome: Progressing Goal: Identification of resources available to assist in meeting health care needs will improve Outcome: Progressing   Problem: Physical Regulation: Goal: Complications related to the disease process, condition or treatment will be avoided or minimized Outcome: Progressing  Patient has been visible in Milieu this shift complaint with treatment. Support and encouragement provided.

## 2023-06-28 NOTE — Progress Notes (Signed)
   06/28/23 2000  Psych Admission Type (Psych Patients Only)  Admission Status Involuntary  Psychosocial Assessment  Patient Complaints None  Eye Contact Fair  Facial Expression Flat  Affect Appropriate to circumstance  Speech Logical/coherent  Interaction Assertive  Motor Activity Other (Comment) (wnl)  Appearance/Hygiene Unremarkable  Behavior Characteristics Cooperative;Appropriate to situation;Calm  Mood Pleasant  Thought Process  Coherency WDL  Content WDL  Delusions None reported or observed  Perception WDL  Hallucination None reported or observed  Judgment Poor  Confusion None  Danger to Self  Current suicidal ideation? Denies

## 2023-06-28 NOTE — [facility] Group Notes (Signed)
[facility] Group Notes:  (Nursing/MHT/Case Management/Adjunct)  Date:  06/28/2023  Time:  9:34 PM  Type of Therapy:   Wrap-up group  Participation Level:  None  Participation Quality:  Resistant  Affect:  Appropriate  Cognitive:  Appropriate  Insight:  Lacking  Engagement in Group:  None  Modes of Intervention:  Education  Summary of Progress/Problems: Pt attended group. Pt choose not to participate in group.  Noah Delaine 06/28/2023, 9:34 PM

## 2023-06-28 NOTE — Progress Notes (Signed)
   06/28/23 0752  Psych Admission Type (Psych Patients Only)  Admission Status Involuntary  Psychosocial Assessment  Patient Complaints None  Eye Contact Fair  Facial Expression Flat  Affect Flat  Speech Logical/coherent  Interaction Assertive  Motor Activity Other (Comment) (WDL)  Appearance/Hygiene Unremarkable  Behavior Characteristics Cooperative  Mood Pleasant  Thought Process  Coherency WDL  Content WDL  Delusions None reported or observed  Perception WDL  Hallucination None reported or observed  Judgment Poor  Confusion None  Danger to Self  Current suicidal ideation? Denies  Agreement Not to Harm Self Yes  Description of Agreement verbal  Danger to Others  Danger to Others None reported or observed

## 2023-06-28 NOTE — Progress Notes (Signed)
[facility] MD Progress Note  06/28/2023 3:01 PM Taylor Wiggins  MRN:  [id number] Principal Problem: Severe bipolar I disorder, current or most recent episode depressed (HCC) Diagnosis: Principal Problem:   Severe bipolar I disorder, current or most recent episode depressed (HCC) Active Problems:   Anxiety disorder   Anxiety   Amphetamine use disorder, severe, dependence (HCC)   Mild benzodiazepine use disorder (HCC)   Opioid use with withdrawal (HCC)  Reason for Admission: Taylor Wiggins is a 42 yo CF who presented to the [facility] [place] with c/o suicidal ideations in the context of substance abuse and altercation with her mother and husband.Pt was transferred to this hospital under IVC status for treatment and stabilization of her mental status.   24 hr chart review: Sleep Hours last night: Good as per patient  Nursing Concerns: None Behavioral episodes in the past 24 hrs: None Medication Compliance: Compliant Vital Signs in the past 24 hrs: Within normal limits PRN Medications in the past 24 hrs: None overnight  Patient assessment note: Reports that all Opiate withdrawal symptoms have resolved, reports a significant improvement since admission, denies SI, denies HI, denies AVH, denies paranoia, denies delusional thoughts and there is no evidence of such. Pt denies being in any physical pain today, verbalizes significant improvement since being hospitalized here at [facility].   Reports a good sleep quality last night, reports a good appetite, denies being in any physical pain, reports no issues with her bowel movements. Continuing to verbalize motivation to go to rehabilitation for substance abuse treatment. Plan is for patient to be discharged for transportation to the [facility] treatment center on 7/1. We are continuing medications as listed below, and will discontinue Bentyl and Ensures since pt has been refusing these. will plan to discharge pt on Monday, 7/1.   Labs reviewed: LFTs  are elevated.  Ordered hepatitis panel test due to this.  Hepatitis C virus reactive.  Total Time spent with patient: 45 minutes  Past Psychiatric History: See H & P  Past Medical History:  Past Medical History:  Diagnosis Date   Adult ADHD    Anxiety    Asthma    Hepatitis C    IBS (irritable bowel syndrome)    Opioid use disorder    No longer on buprenorphine therapy   PTSD (post-traumatic stress disorder)     Past Surgical History:  Procedure Laterality Date   BREAST REDUCTION SURGERY  2005   Family History:  Family History  Problem Relation Age of Onset   Anxiety disorder Mother    Diabetes Mother    Anxiety disorder Father    Family Psychiatric  History: See H & P Social History:  Social History   Substance and Sexual Activity  Alcohol Use No     Social History   Substance and Sexual Activity  Drug Use Not Currently   Types: Methamphetamines   Comment: no methamphetamine use in 30 days    Social History   Socioeconomic History   Marital status: Legally Separated    Spouse name: Not on file   Number of children: Not on file   Years of education: Not on file   Highest education level: Not on file  Occupational History   Not on file  Tobacco Use   Smoking status: Every Day    Packs/day: .5    Types: Cigarettes    Last attempt to quit: 09/06/2014    Years since quitting: 8.8   Smokeless tobacco: Never  Vaping Use   Vaping Use: Every day  Substance and Sexual Activity   Alcohol use: No   Drug use: Not Currently    Types: Methamphetamines    Comment: no methamphetamine use in 30 days   Sexual activity: Not Currently    Birth control/protection: Abstinence  Other Topics Concern   Not on file  Social History Narrative   42 y/o Caucasian female. Married and homeless with polysubstance abuse. Per pt-Graduated from [employer] in 2006 with bachelors degree in Business Administration.      04/24/23- Patient reported separated from husband but not  legally at this time. Pt states she is going through several hardships this year and is currently taking care of mother and using address at this time. Requests information on resources for when she is no longer with mother.    Social Determinants of Health   Financial Resource Strain: Not on file  Food Insecurity: Patient Declined (06/22/2023)   Hunger Vital Sign    Worried About Running Out of Food in the Last Year: Patient declined    Ran Out of Food in the Last Year: Patient declined  Recent Concern: Food Insecurity - Food Insecurity Present (04/24/2023)   Hunger Vital Sign    Worried About Running Out of Food in the Last Year: Sometimes true    Ran Out of Food in the Last Year: Sometimes true  Transportation Needs: Patient Declined (06/22/2023)   PRAPARE - Administrator, Civil Service (Medical): Patient declined    Lack of Transportation (Non-Medical): Patient declined  Physical Activity: Not on file  Stress: Not on file  Social Connections: Not on file   Sleep: Poor  Appetite:  Poor  Current Medications: Current Facility-Administered Medications  Medication Dose Route Frequency Provider Last Rate Last Admin   acetaminophen (TYLENOL) tablet 650 mg  650 mg Oral Q6H PRN Jearld Lesch, NP   650 mg at 06/25/23 1840   albuterol (VENTOLIN HFA) 108 (90 Base) MCG/ACT inhaler 2 puff  2 puff Inhalation Q6H PRN Ntuen, Jesusita Oka, FNP       alum & mag hydroxide-simeth (MAALOX/MYLANTA) 200-200-20 MG/5ML suspension 30 mL  30 mL Oral Q4H PRN Dixon, Rashaun M, NP       diphenhydrAMINE (BENADRYL) capsule 50 mg  50 mg Oral TID PRN Jearld Lesch, NP       Or   diphenhydrAMINE (BENADRYL) injection 50 mg  50 mg Intramuscular TID PRN Dixon, Rashaun M, NP       haloperidol (HALDOL) tablet 5 mg  5 mg Oral TID PRN Jearld Lesch, NP       Or   haloperidol lactate (HALDOL) injection 5 mg  5 mg Intramuscular TID PRN Jearld Lesch, NP       hydrOXYzine (ATARAX) tablet 25 mg  25 mg Oral  TID PRN Jearld Lesch, NP       loperamide (IMODIUM) capsule 2 mg  2 mg Oral TID PRN Starleen Blue, NP       LORazepam (ATIVAN) tablet 2 mg  2 mg Oral TID PRN Jearld Lesch, NP       Or   LORazepam (ATIVAN) injection 2 mg  2 mg Intramuscular TID PRN Dixon, Rashaun M, NP       magnesium hydroxide (MILK OF MAGNESIA) suspension 30 mL  30 mL Oral Daily PRN Jearld Lesch, NP       multivitamin with minerals tablet 1 tablet  1 tablet Oral Daily  Starleen Blue, NP   1 tablet at 06/28/23 4098   QUEtiapine (SEROQUEL) tablet 100 mg  100 mg Oral QHS Alexsandria Kivett, NP   100 mg at 06/27/23 2110   sertraline (ZOLOFT) tablet 100 mg  100 mg Oral Daily Starleen Blue, NP   100 mg at 06/28/23 1191   traZODone (DESYREL) tablet 50 mg  50 mg Oral QHS PRN Jearld Lesch, NP       vitamin D3 (CHOLECALCIFEROL) tablet 1,000 Units  1,000 Units Oral Daily Starleen Blue, NP   1,000 Units at 06/28/23 4782    Lab Results:  No results found for this or any previous visit (from the past 48 hour(s)).   Blood Alcohol level:  Lab Results  Component Value Date   ETH <10 06/20/2023   ETH <10 05/06/2023    Metabolic Disorder Labs: Lab Results  Component Value Date   HGBA1C 5.6 06/25/2023   MPG 114 06/25/2023   MPG 105.41 08/06/2022   No results found for: "PROLACTIN" Lab Results  Component Value Date   CHOL 194 06/25/2023   TRIG 74 06/25/2023   HDL 57 06/25/2023   CHOLHDL 3.4 06/25/2023   VLDL 15 06/25/2023   LDLCALC 122 (H) 06/25/2023   LDLCALC 97 08/06/2022    Physical Findings: AIMS:  , ,  ,  ,    CIWA:  CIWA-Ar Total: 3 COWS:     Musculoskeletal: Strength & Muscle Tone: within normal limits Gait & Station: normal Patient leans: N/A  Psychiatric Specialty Exam:  Presentation  General Appearance:  Appropriate for Environment; Fairly Groomed  Eye Contact: Fair; Good  Speech: Clear and Coherent  Speech Volume: Normal  Handedness: Right   Mood and Affect   Mood: Euthymic  Affect: Congruent   Thought Process  Thought Processes: Coherent  Descriptions of Associations:Intact  Orientation:Full (Time, Place and Person)  Thought Content:Logical  History of Schizophrenia/Schizoaffective disorder:No  Duration of Psychotic Symptoms:N/A  Hallucinations:Hallucinations: None   Ideas of Reference:None  Suicidal Thoughts:Suicidal Thoughts: No   Homicidal Thoughts:Homicidal Thoughts: No    Sensorium  Memory: Immediate Good  Judgment: Fair  Insight: Fair   Art therapist  Concentration: Good  Attention Span: Good  Recall: Good  Fund of Knowledge: Fair  Language: Good   Psychomotor Activity  Psychomotor Activity: Psychomotor Activity: Normal    Assets  Assets: Communication Skills; Resilience; Social Support  Sleep  Sleep: Sleep: Good   Physical Exam: Physical Exam Constitutional:      Appearance: Normal appearance.  HENT:     Nose: Nose normal.  Eyes:     Pupils: Pupils are equal, round, and reactive to light.  Musculoskeletal:        General: Normal range of motion.     Cervical back: Normal range of motion.  Neurological:     Mental Status: She is alert.    Review of Systems  Constitutional:  Negative for fever.  HENT:  Negative for hearing loss.   Eyes:  Negative for blurred vision.  Respiratory:  Negative for cough.   Cardiovascular:  Negative for chest pain.  Gastrointestinal:  Negative for heartburn.  Genitourinary:  Negative for dysuria.  Musculoskeletal:  Negative for myalgias.  Neurological:  Negative for dizziness.  Psychiatric/Behavioral:  Positive for depression and substance abuse. Negative for hallucinations, memory loss and suicidal ideas. The patient is nervous/anxious and has insomnia.    Blood pressure 106/75, pulse 79, temperature 98.3 F (36.8 C), temperature source Oral, resp. rate 18, height 5\' 7"  (1.702  m), weight 61.5 kg, last menstrual period  05/20/2023, SpO2 98 %. Body mass index is 21.22 kg/m.   Treatment Plan Summary: Daily contact with patient to assess and evaluate symptoms and progress in treatment and Medication management  Safety and Monitoring: Voluntary admission to inpatient psychiatric unit for safety, stabilization and treatment Daily contact with patient to assess and evaluate symptoms and progress in treatment Patient's case to be discussed in multi-disciplinary team meeting Observation Level : q15 minute checks Vital signs: q12 hours Precautions: Safety  Long Term Goal(s): Improvement in symptoms so as ready for discharge  Short Term Goals: Ability to identify changes in lifestyle to reduce recurrence of condition will improve, Ability to disclose and discuss suicidal ideas, Ability to identify and develop effective coping behaviors will improve, Ability to maintain clinical measurements within normal limits will improve, Compliance with prescribed medications will improve, and Ability to identify triggers associated with substance abuse/mental health issues will improve  Diagnoses Principal Problem:   Severe bipolar I disorder, current or most recent episode depressed (HCC) Active Problems:   Anxiety disorder   Anxiety   Amphetamine use disorder, severe, dependence (HCC)   Mild benzodiazepine use disorder (HCC)   Opioid use with withdrawal (HCC)  Medications -Discontinue Bentyl 10 mg BID as no longer has  stomach cramps -Continue Vitamin D 3 daily for low Vitamin D levels. -Continue Seroquel 100 mg nightly for mood stabilization and sleep -Continue Zoloft 100 mg daily for MDD -Discontinue Ensure Nutritional shakes for nutritional supplementation-Eating well now  -Continue hydroxyzine 25 mg 3 times daily as needed for anxiety -Albuterol as needed for wheezing or shortness of breath  -Continue agitation protocol medications: Ativan/Benadryl/Haldol as needed 3 times daily for agitation -Previously  Discontinued Klonopin 0.5 mg PRNs -Previously Discontinue Zyprexa IM/p.o. 10 mg as needed   PRNS -Continue trazodone 50 mg as needed nightly for sleep -Continue Tylenol 650 mg every 6 hours PRN for mild pain -Continue Maalox 30 mg every 4 hrs PRN for indigestion -Continue Milk of Magnesia as needed every 6 hrs for constipation  Discharge Planning: Social work and case management to assist with discharge planning and identification of hospital follow-up needs prior to discharge Estimated LOS: 5-7 days Discharge Concerns: Need to establish a safety plan; Medication compliance and effectiveness Discharge Goals: Return home with outpatient referrals for mental health follow-up including medication management/psychotherapy  I certify that inpatient services furnished can reasonably be expected to improve the patient's condition.    Starleen Blue, [place] 6/29/20243:01 PM    Patient ID: Taylor Wiggins, female   DOB: [birth year], 42 y.o.   MRN: [id number] Patient ID: Taylor Wiggins, female   DOB: [birth year], 42 y.o.   MRN: [id number] Patient ID: Taylor Wiggins, female   DOB: 1981-09-06, 42 y.o.   MRN: [id number]

## 2023-06-29 DIAGNOSIS — F314 Bipolar disorder, current episode depressed, severe, without psychotic features: Secondary | ICD-10-CM | POA: Diagnosis not present

## 2023-06-29 MED ORDER — ENSURE ENLIVE PO LIQD
237.0000 mL | Freq: Two times a day (BID) | ORAL | Status: DC
  Filled 2023-06-29 (×5): qty 237

## 2023-06-29 NOTE — Progress Notes (Signed)
This patient stated she has court case scheduled tomorrow July 1, and needs to be discharged from [facility] today.  Stated she MUST be in court tomorrow July 1.

## 2023-06-29 NOTE — Progress Notes (Signed)
D:  Patient's self inventory sheet, patient sleeps good, sleep medication helpful.  Good appetite, normal enemy level, good concentration.  Rated depression 1, hopeless and anxiety 4.  Denied withdrawals.  Denied SI.  Denied physical problems.  Denied physical pain.  Goal is small outside goals.  Plans.  Does have discharge plans. A:  Medications administered per MD orders.  Emotional support and encouragement given patient. R:  Denied SI and HI, contracts for safety.  Denied A/V hallucinations.  Safety maintained with 15 minute checks.

## 2023-06-29 NOTE — Group Note (Signed)
[facility] LCSW Group Therapy Note    Group Date: 06/29/2023 Start Time: 1000 End Time: 1100  Type of Therapy and Topic:  Group Therapy:  Overcoming Obstacles  Participation Level:  [facility] PARTICIPATION LEVEL: Did Not Attend   Description of Group:   In this group patients will be encouraged to explore what they see as obstacles to their own wellness and recovery. They will be guided to discuss their thoughts, feelings, and behaviors related to these obstacles. The group will process together ways to cope with barriers, with attention given to specific choices patients can make. Each patient will be challenged to identify changes they are motivated to make in order to overcome their obstacles. This group will be process-oriented, with patients participating in exploration of their own experiences as well as giving and receiving support and challenge from other group members.  Therapeutic Goals: 1. Patient will identify personal and current obstacles as they relate to admission. 2. Patient will identify barriers that currently interfere with their wellness or overcoming obstacles.  3. Patient will identify feelings, thought process and behaviors related to these barriers. 4. Patient will identify two changes they are willing to make to overcome these obstacles:    Summary of Patient Progress Pt did not attend group.     Therapeutic Modalities:   Cognitive Behavioral Therapy Solution Focused Therapy Motivational Interviewing Relapse Prevention Therapy   Iman Orourke A Briyah Wheelwright, LCSW 

## 2023-06-29 NOTE — Progress Notes (Signed)
   06/29/23 0551  15 Minute Checks  Location Bedroom  Visual Appearance Calm  Behavior Sleeping  Sleep (Behavioral Health Patients Only)  Calculate sleep? (Click Yes once per 24 hr at 0600 safety check) Yes  Documented sleep last 24 hours 8.5

## 2023-06-29 NOTE — Plan of Care (Signed)
Nurse discussed anxiety, depression and coping skills with patient.  

## 2023-06-29 NOTE — [facility] Suicide Risk Assessment (Signed)
[facility] INPATIENT:  Family/Significant Other Suicide Prevention Education  Suicide Prevention Education:  Patient Refusal for Family/Significant Other Suicide Prevention Education: The patient Taylor Wiggins has refused to provide written consent for family/significant other to be provided Family/Significant Other Suicide Prevention Education during admission and/or prior to discharge.  Physician notified.  Malachy Chamber [place] Warriner 06/29/2023, 9:53 AM

## 2023-06-29 NOTE — Group Note (Signed)
Date:  06/29/2023 Time:  4:55 PM  Group Topic/Focus:  Goals Group:   The focus of this group is to help patients establish daily goals to achieve during treatment and discuss how the patient can incorporate goal setting into their daily lives to aide in recovery. Orientation:   The focus of this group is to educate the patient on the purpose and policies of crisis stabilization and provide a format to answer questions about their admission.  The group details unit policies and expectations of patients while admitted.    Participation Level:  Did Not Attend  Participation Quality:   n/a  Affect:   n/a  Cognitive:   n/a  Insight: None  Engagement in Group:   n/a  Modes of Intervention:   n/a  Additional Comments:   Pt did not attend.  Edmund Hilda Rahmah Mccamy 06/29/2023, 4:55 PM

## 2023-06-29 NOTE — [facility] Group Notes (Signed)
Pt did not attend group discussion 

## 2023-06-29 NOTE — [facility] Group Notes (Signed)
[facility] Group Notes:  (Nursing)  Date:  06/29/2023  Time: 1400  Type of Therapy:  Nurse Education  Participation Level:  Did Not Attend    Summary of Progress/Problems:  Healthy relationships  What makes for a healthy relationship? How to identify a unhealthy relationship?  Shela Nevin 06/29/2023, 2:50 PM

## 2023-06-29 NOTE — Progress Notes (Signed)
[facility] MD Progress Note  06/29/2023 3:30 PM SHAWNDEL Wiggins  MRN:  [id number] Principal Problem: Severe bipolar I disorder, current or most recent episode depressed (HCC) Diagnosis: Principal Problem:   Severe bipolar I disorder, current or most recent episode depressed (HCC) Active Problems:   Anxiety disorder   Anxiety   Amphetamine use disorder, severe, dependence (HCC)   Mild benzodiazepine use disorder (HCC)   Opioid use with withdrawal (HCC)  Reason for Admission: Taylor Wiggins. Wice is a 42 yo CF who presented to the M.  at [place] with c/o suicidal ideations in the context of substance abuse and altercation with her mother and husband.Pt was transferred to this hospital under IVC status for treatment and stabilization of her mental status.   Yesterday the psychiatry team made the following recommendations:  -Discontinue Bentyl 10 mg BID as no longer has  stomach cramps -Continue Vitamin D 3 daily for low Vitamin D levels. -Continue Seroquel 100 mg nightly for mood stabilization and sleep -Continue Zoloft 100 mg daily for MDD -Discontinue Ensure Nutritional shakes for nutritional supplementation-Eating well now  -Continue hydroxyzine 25 mg 3 times daily as needed for anxiety -Albuterol as needed for wheezing or shortness of breath -Continue agitation protocol medications: Ativan/Benadryl/Haldol as needed 3 times daily for agitation -Previously Discontinued Klonopin 0.5 mg PRNs -Previously Discontinue Zyprexa IM/p.o. 10 mg as needed   On assessment today, the pt reports that her mood is less depressed and rates depression as #2/10, with 10 being high severity. Reports that anxiety is #2/10.  Reports she is ready to be discharged due to scheduled court case tomorrow morning.  Social worker asked to check patient's court case so as to notify them that patient is hospitalized at the behavioral health, upon checking, no court case found on patient's name. Patient was made aware.   Also report that she wants to leave the hospital to enable her to renew her insurance that has lapsed, reports after renewal of insurance, husband will transport her to [place] for her drug rehabilitation.  She appears pleasantly agitated and anxious for discharge.  Vital signs is stable and encouraged patient to call for as needed medication for agitation. Nursing staff report sleeping 8.5 hours last night.  Appetite is good Concentration is adequate Energy level appears increased Denies suicidal thoughts.  Denies suicidal intent or plan.  Denies having any HI.  Denies having psychotic symptoms.   Denies having side effects to current psychiatric medications.   We discussed compliance to current medication regimen.  Discussed the following psychosocial stressors: Not sure if patient is experiencing opioid withdrawal symptoms, however informed patient to report to nursing staff if the symptoms continues.  Continuing to verbalize motivation to go to rehabilitation for substance abuse treatment. Plan is for patient to be discharged with transportation to the [facility] treatment center on 7/1. We are continuing medications as listed below.  Will plan to discharge pt on Monday, 7/1, if she is stabilized.   Labs reviewed: LFTs are elevated.  Ordered hepatitis panel test due to this.  Hepatitis C virus reactive.  Total Time spent with patient: 45 minutes  Past Psychiatric History: As per the records patient has a history of substance use disorder including alcohol, opiates, tobacco, amphetamines. She also has a history of substance-induced mood disorder and psychosis.   Past Medical History:  Past Medical History:  Diagnosis Date   Adult ADHD    Anxiety    Asthma    Hepatitis C  IBS (irritable bowel syndrome)    Opioid use disorder    No longer on buprenorphine therapy   PTSD (post-traumatic stress disorder)     Past Surgical History:  Procedure Laterality Date   BREAST REDUCTION  SURGERY  2005   Family History:  Family History  Problem Relation Age of Onset   Anxiety disorder Mother    Diabetes Mother    Anxiety disorder Father    Family Psychiatric  History: See H & P Social History:  Social History   Substance and Sexual Activity  Alcohol Use No     Social History   Substance and Sexual Activity  Drug Use Not Currently   Types: Methamphetamines   Comment: no methamphetamine use in 30 days    Social History   Socioeconomic History   Marital status: Legally Separated    Spouse name: Not on file   Number of children: Not on file   Years of education: Not on file   Highest education level: Not on file  Occupational History   Not on file  Tobacco Use   Smoking status: Every Day    Packs/day: .5    Types: Cigarettes    Last attempt to quit: 09/06/2014    Years since quitting: 8.8   Smokeless tobacco: Never  Vaping Use   Vaping Use: Every day  Substance and Sexual Activity   Alcohol use: No   Drug use: Not Currently    Types: Methamphetamines    Comment: no methamphetamine use in 30 days   Sexual activity: Not Currently    Birth control/protection: Abstinence  Other Topics Concern   Not on file  Social History Narrative   42 y/o Caucasian female. Married and homeless with polysubstance abuse. Per pt-Graduated from [employer] in 2006 with bachelors degree in Business Administration.      04/24/23- Patient reported separated from husband but not legally at this time. Pt states she is going through several hardships this year and is currently taking care of mother and using address at this time. Requests information on resources for when she is no longer with mother.    Social Determinants of Health   Financial Resource Strain: Not on file  Food Insecurity: Patient Declined (06/22/2023)   Hunger Vital Sign    Worried About Running Out of Food in the Last Year: Patient declined    Ran Out of Food in the Last Year: Patient declined   Recent Concern: Food Insecurity - Food Insecurity Present (04/24/2023)   Hunger Vital Sign    Worried About Running Out of Food in the Last Year: Sometimes true    Ran Out of Food in the Last Year: Sometimes true  Transportation Needs: Patient Declined (06/22/2023)   PRAPARE - Administrator, Civil Service (Medical): Patient declined    Lack of Transportation (Non-Medical): Patient declined  Physical Activity: Not on file  Stress: Not on file  Social Connections: Not on file   Sleep: Poor  Appetite:  Poor  Current Medications: Current Facility-Administered Medications  Medication Dose Route Frequency Provider Last Rate Last Admin   acetaminophen (TYLENOL) tablet 650 mg  650 mg Oral Q6H PRN Jearld Lesch, NP   650 mg at 06/25/23 1840   albuterol (VENTOLIN HFA) 108 (90 Base) MCG/ACT inhaler 2 puff  2 puff Inhalation Q6H PRN Alexus Michael, Jesusita Oka, FNP       alum & mag hydroxide-simeth (MAALOX/MYLANTA) 200-200-20 MG/5ML suspension 30 mL  30 mL Oral Q4H PRN  Jearld Lesch, NP       diphenhydrAMINE (BENADRYL) capsule 50 mg  50 mg Oral TID PRN Jearld Lesch, NP       Or   diphenhydrAMINE (BENADRYL) injection 50 mg  50 mg Intramuscular TID PRN Durwin Nora, Elray Buba, NP       haloperidol (HALDOL) tablet 5 mg  5 mg Oral TID PRN Jearld Lesch, NP       Or   haloperidol lactate (HALDOL) injection 5 mg  5 mg Intramuscular TID PRN Jearld Lesch, NP       hydrOXYzine (ATARAX) tablet 25 mg  25 mg Oral TID PRN Jearld Lesch, NP   25 mg at 06/29/23 1415   loperamide (IMODIUM) capsule 2 mg  2 mg Oral TID PRN Starleen Blue, NP       LORazepam (ATIVAN) tablet 2 mg  2 mg Oral TID PRN Jearld Lesch, NP       Or   LORazepam (ATIVAN) injection 2 mg  2 mg Intramuscular TID PRN Jearld Lesch, NP       magnesium hydroxide (MILK OF MAGNESIA) suspension 30 mL  30 mL Oral Daily PRN Jearld Lesch, NP       multivitamin with minerals tablet 1 tablet  1 tablet Oral Daily Starleen Blue, NP    1 tablet at 06/29/23 0856   QUEtiapine (SEROQUEL) tablet 100 mg  100 mg Oral QHS Nkwenti, Tyler Aas, NP   100 mg at 06/28/23 2051   sertraline (ZOLOFT) tablet 100 mg  100 mg Oral Daily Starleen Blue, NP   100 mg at 06/29/23 0856   traZODone (DESYREL) tablet 50 mg  50 mg Oral QHS PRN Jearld Lesch, NP       vitamin D3 (CHOLECALCIFEROL) tablet 1,000 Units  1,000 Units Oral Daily Starleen Blue, NP   1,000 Units at 06/29/23 2956   Lab Results:  No results found for this or any previous visit (from the past 48 hour(s)).  Blood Alcohol level:  Lab Results  Component Value Date   ETH <10 06/20/2023   ETH <10 05/06/2023   Metabolic Disorder Labs: Lab Results  Component Value Date   HGBA1C 5.6 06/25/2023   MPG 114 06/25/2023   MPG 105.41 08/06/2022   No results found for: "PROLACTIN" Lab Results  Component Value Date   CHOL 194 06/25/2023   TRIG 74 06/25/2023   HDL 57 06/25/2023   CHOLHDL 3.4 06/25/2023   VLDL 15 06/25/2023   LDLCALC 122 (H) 06/25/2023   LDLCALC 97 08/06/2022   Physical Findings: AIMS:  , ,  ,  ,    CIWA:  CIWA-Ar Total: 3 COWS:     Musculoskeletal: Strength & Muscle Tone: within normal limits Gait & Station: normal Patient leans: N/A  Psychiatric Specialty Exam:  Presentation  General Appearance:  Casual  Eye Contact: Fair  Speech: Clear and Coherent  Speech Volume: Increased  Handedness: Right  Mood and Affect  Mood: Euthymic  Affect: Congruent  Thought Process  Thought Processes: Coherent  Descriptions of Associations:Intact  Orientation:Full (Time, Place and Person)  Thought Content:Logical  History of Schizophrenia/Schizoaffective disorder:No  Duration of Psychotic Symptoms:N/A  Hallucinations:Hallucinations: None  Ideas of Reference:None  Suicidal Thoughts:Suicidal Thoughts: No SI Active Intent and/or Plan: -- (Denies) SI Passive Intent and/or Plan: -- (Denies)  Homicidal Thoughts:Homicidal Thoughts: No HI  Passive Intent and/or Plan: -- (Denies)  Sensorium  Memory: Immediate Good; Recent Good  Judgment: Fair  Insight: Fair  Executive Functions  Concentration: Good  Attention Span: Good  Recall: [employer] of Knowledge: Fair  Language: Fair  Psychomotor Activity  Psychomotor Activity: Psychomotor Activity: Normal  Assets  Assets: Manufacturing systems engineer; Desire for Improvement; Physical Health; Resilience  Sleep  Sleep: Sleep: Good Number of Hours of Sleep: 8.5  Physical Exam: Physical Exam Constitutional:      Appearance: Normal appearance.  HENT:     Head: Normocephalic.     Nose: Nose normal.     Mouth/Throat:     Mouth: Mucous membranes are moist.  Eyes:     Pupils: Pupils are equal, round, and reactive to light.  Cardiovascular:     Rate and Rhythm: Normal rate.     Pulses: Normal pulses.  Pulmonary:     Effort: Pulmonary effort is normal.  Abdominal:     Palpations: Abdomen is soft.  Musculoskeletal:        General: Normal range of motion.     Cervical back: Normal range of motion.  Skin:    General: Skin is warm.  Neurological:     General: No focal deficit present.     Mental Status: She is alert and oriented to person, place, and time.  Psychiatric:        Behavior: Behavior normal.    Review of Systems  Constitutional:  Negative for fever.  HENT:  Negative for hearing loss.   Eyes:  Negative for blurred vision.  Respiratory:  Negative for cough.   Cardiovascular:  Negative for chest pain.  Gastrointestinal:  Negative for heartburn.  Genitourinary:  Negative for dysuria.  Musculoskeletal:  Negative for myalgias.  Neurological:  Negative for dizziness.  Psychiatric/Behavioral:  Positive for depression and substance abuse. Negative for hallucinations, memory loss and suicidal ideas. The patient is nervous/anxious and has insomnia.    Blood pressure 101/73, pulse 76, temperature 98.7 F (37.1 C), temperature source Oral, resp. rate  18, height 5\' 7"  (1.702 m), weight 61.5 kg, last menstrual period 05/20/2023, SpO2 99 %. Body mass index is 21.22 kg/m.   Treatment Plan Summary: Daily contact with patient to assess and evaluate symptoms and progress in treatment and Medication management  Safety and Monitoring: Voluntary admission to inpatient psychiatric unit for safety, stabilization and treatment Daily contact with patient to assess and evaluate symptoms and progress in treatment Patient's case to be discussed in multi-disciplinary team meeting Observation Level : q15 minute checks Vital signs: q12 hours Precautions: Safety  Long Term Goal(s): Improvement in symptoms so as ready for discharge  Short Term Goals: Ability to identify changes in lifestyle to reduce recurrence of condition will improve, Ability to disclose and discuss suicidal ideas, Ability to identify and develop effective coping behaviors will improve, Ability to maintain clinical measurements within normal limits will improve, Compliance with prescribed medications will improve, and Ability to identify triggers associated with substance abuse/mental health issues will improve  Diagnoses Principal Problem:   Severe bipolar I disorder, current or most recent episode depressed (HCC) Active Problems:   Anxiety disorder   Anxiety   Amphetamine use disorder, severe, dependence (HCC)   Mild benzodiazepine use disorder (HCC)   Opioid use with withdrawal (HCC)  Medications -Discontinue Bentyl 10 mg BID as no longer has  stomach cramps -Continue Vitamin D 3 daily for low Vitamin D levels. -Continue Seroquel 100 mg nightly for mood stabilization and sleep -Continue Zoloft 100 mg daily for MDD -Discontinue Ensure Nutritional shakes for nutritional supplementation-Eating well now  -Continue hydroxyzine 25 mg  3 times daily as needed for anxiety -Albuterol as needed for wheezing or shortness of breath  -Continue agitation protocol medications:  Ativan/Benadryl/Haldol as needed 3 times daily for agitation -Previously Discontinued Klonopin 0.5 mg PRNs -Previously Discontinue Zyprexa IM/p.o. 10 mg as needed   PRNS -Continue trazodone 50 mg as needed nightly for sleep -Continue Tylenol 650 mg every 6 hours PRN for mild pain -Continue Maalox 30 mg every 4 hrs PRN for indigestion -Continue Milk of Magnesia as needed every 6 hrs for constipation  Discharge Planning: Social work and case management to assist with discharge planning and identification of hospital follow-up needs prior to discharge Estimated LOS: 5-7 days Discharge Concerns: Need to establish a safety plan; Medication compliance and effectiveness Discharge Goals: Return home with outpatient referrals for mental health follow-up including medication management/psychotherapy  I certify that inpatient services furnished can reasonably be expected to improve the patient's condition.    Cecilie Lowers, FNP 6/30/20243:30 PM    Patient ID: [name], female   DOB: 1981/04/18, 43 y.o.   MRN: [id number] Patient ID: MIKHAIL GEBEL, female   DOB: 1981/02/09, 42 y.o.   MRN: [id number] Patient ID: GERLDINE CRAYNE, female   DOB: Aug 04, 1981, 42 y.o.   MRN: [id number] Patient ID: YANIN TEPEDINO, female   DOB: 1981/07/12, 42 y.o.   MRN: [id number]

## 2023-06-29 NOTE — Progress Notes (Signed)
   06/29/23 2024  Psych Admission Type (Psych Patients Only)  Admission Status Involuntary  Psychosocial Assessment  Patient Complaints Anxiety  Eye Contact Fair  Facial Expression Anxious  Affect Appropriate to circumstance  Speech Logical/coherent  Interaction Assertive  Motor Activity Other (Comment) (WNL)  Behavior Characteristics Cooperative  Mood Depressed  Thought Process  Coherency WDL  Content WDL  Delusions None reported or observed  Perception WDL  Hallucination None reported or observed  Judgment Impaired  Confusion None  Danger to Self  Current suicidal ideation? Denies  Self-Injurious Behavior No self-injurious ideation or behavior indicators observed or expressed   Agreement Not to Harm Self Yes  Description of Agreement VERBAL CONTRACT FOR SAFETY  Danger to Others  Danger to Others None reported or observed   D: Patient in dayroom reports she had a good day but c/o about ongoing toothache.  A: Medications administered as prescribed. Support and encouragement provided as needed.  R: Patient remains safe on the unit. Plan of care ongoing for safety and stability.

## 2023-06-30 DIAGNOSIS — F314 Bipolar disorder, current episode depressed, severe, without psychotic features: Secondary | ICD-10-CM | POA: Diagnosis not present

## 2023-06-30 MED ORDER — VITAMIN D3 25 MCG PO TABS: 1000.0000 [IU] | ORAL_TABLET | Freq: Every day | ORAL | 0 refills | Status: AC

## 2023-06-30 MED ORDER — QUETIAPINE FUMARATE 100 MG PO TABS: 100.0000 mg | ORAL_TABLET | Freq: Every day | ORAL | 0 refills | Status: DC

## 2023-06-30 MED ORDER — SERTRALINE HCL 100 MG PO TABS: 100.0000 mg | ORAL_TABLET | Freq: Every day | ORAL | 0 refills | Status: DC

## 2023-06-30 MED ORDER — ALBUTEROL SULFATE HFA 108 (90 BASE) MCG/ACT IN AERS: 2.0000 | INHALATION_SPRAY | Freq: Four times a day (QID) | RESPIRATORY_TRACT | 0 refills | Status: DC | PRN

## 2023-06-30 NOTE — BHH Suicide Risk Assessment (Cosign Needed Addendum)
Suicide Risk Assessment  Discharge Assessment    [facility] Discharge Suicide Risk Assessment   Principal Problem: Severe bipolar I disorder, current or most recent episode depressed [facility]) Discharge Diagnoses: Principal Problem:   Severe bipolar I disorder, current or most recent episode depressed (HCC) Active Problems:   Anxiety disorder   Anxiety   Amphetamine use disorder, severe, dependence (HCC)   Mild benzodiazepine use disorder (HCC)   Opioid use with withdrawal [facility])  Patient assessment note: Reason for Admission: Taylor Wiggins is a 42 yo CF who presented to the [facility] [place] with c/o suicidal ideations in the context of substance abuse and altercation with her mother and husband.Pt was transferred to this hospital under IVC status for treatment and stabilization of her mental status.    Hospital Course:  During the patient's hospitalization, patient had extensive initial psychiatric evaluation, and follow-up psychiatric evaluations every day. Psychiatric diagnoses provided upon initial assessment are as listed above. Seroquel was started on admission, and gradually tapered off. 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 Pt was given supportive medications for physiological symptoms related to Opioid withdrawals, and is currently free of all Opioid withdrawal related symptoms.   Medications at discharge are as follows: -Continue Vitamin D 3 daily for low Vitamin D levels. -Continue Seroquel 100 mg nightly for mood stabilization and sleep -Continue Zoloft 100 mg daily for MDD -Continue Albuterol PRN for wheezing or shortness of breath   Patient's care was discussed during the interdisciplinary team meeting every day during the hospitalization. The patient denies having side effects to prescribed psychiatric medication. Gradually, patient started adjusting to milieu. The patient was evaluated each day by a clinical provider  to ascertain response to treatment. Improvement was noted by the patient's report of decreasing symptoms, improved sleep and appetite, affect, medication tolerance, behavior, and participation in unit programming.  Patient was asked each day to complete a self inventory noting mood, mental status, pain, new symptoms, anxiety and concerns.    Symptoms were reported as significantly decreased or resolved completely by discharge. On day of discharge, the patient reports that their mood is stable. The patient denied having suicidal thoughts for more than 48 hours prior to discharge.  Patient denies having homicidal thoughts.  Patient denies having auditory hallucinations.  Patient denies any visual hallucinations or other symptoms of psychosis. The patient was motivated to continue taking medication with a goal of continued improvement in mental health.   The patient reports their target psychiatric symptoms of depression, insomnia, Opioid withdrawal symptoms & anxiety responded well to the psychiatric medications, and the patient reports overall benefit from this psychiatric hospitalization. Supportive psychotherapy was provided to the patient. The patient also participated in regular group therapy while hospitalized. Coping skills, problem solving as well as relaxation therapies were also part of the unit programming.  Labs were reviewed with the patient, and abnormal results were discussed with the patient. LFTs elevated, pt reactive to hepatitis C test, educated on the need to f/u with her PCP regarding this.  The patient is able to verbalize their individual safety plan to this provider.  # It is recommended to the patient to continue psychiatric medications as prescribed, after discharge from the hospital.    # It is recommended to the patient to follow up with your outpatient psychiatric provider and PCP.  # It was discussed with the patient, the impact of alcohol, drugs, tobacco have been there  overall psychiatric and medical wellbeing, and total abstinence from substance use was recommended the patient.ed.  # Prescriptions provided or sent directly to preferred pharmacy at discharge. Patient agreeable to plan. Given opportunity to ask questions. Appears to feel comfortable with discharge.    # In the event of worsening symptoms, the patient is instructed to call the crisis hotline, 911 and or go to the nearest ED for appropriate evaluation and treatment of symptoms. To follow-up with primary care provider for other medical issues, concerns and or health care needs  # Patient was discharged to her husband's home with a plan to follow up outpatient as noted below. Pt reports that she had a lapse in her insurance, and required $500 upfront to renew the health insurance prior to it becoming active again, and did not have the money. She reports that she canceled with [facility] even though they had already accepted her. She also reports that she intends to go there once she gets things situated for herself on the outside of this hospital.  Total Time spent with patient: 45 minutes  Musculoskeletal: Strength & Muscle Tone: within normal limits Gait & Station: normal Patient leans: N/A  Psychiatric Specialty Exam  Presentation  General Appearance:  Appropriate for Environment  Eye Contact: Good  Speech: Clear and Coherent  Speech Volume: Normal  Handedness: Right   Mood and Affect  Mood: Euthymic; Anxious  Duration of Depression Symptoms: Greater than two weeks  Affect: Appropriate   Thought Process  Thought Processes: Coherent  Descriptions of Associations:Intact  Orientation:Full (Time, Place and Person)  Thought Content:Logical  History of Schizophrenia/Schizoaffective disorder:No  Duration of Psychotic Symptoms:N/A  Hallucinations:Hallucinations: None  Ideas of Reference:None  Suicidal Thoughts:Suicidal Thoughts: No SI Active  Intent and/or Plan: -- (Denies) SI Passive Intent and/or Plan: -- (Denies)  Homicidal Thoughts:Homicidal Thoughts: No HI Passive Intent and/or Plan: -- (Denies)  Sensorium  Memory: Immediate Good  Judgment: Fair  Insight: Fair  Art therapist  Concentration: Good  Attention Span: Good  Recall: Good  Fund of Knowledge: Good  Language: Good  Psychomotor Activity  Psychomotor Activity: Psychomotor Activity: Normal  Assets  Assets: Communication Skills  Sleep  Sleep: Sleep: Good Number of Hours of Sleep: 8.5   Physical Exam: Physical Exam Constitutional:      Appearance: Normal appearance.  Eyes:     Pupils: Pupils are equal, round, and reactive to light.  Musculoskeletal:        General: Normal range of motion.     Cervical back: Normal range of motion.  Neurological:     Mental Status: She is alert and oriented to person, place, and time.  Psychiatric:        Behavior: Behavior normal.        Thought Content: Thought content normal.    Review of Systems  Constitutional:  Negative for fever.  HENT:  Negative for hearing loss.   Eyes:  Negative for blurred vision.  Respiratory:  Negative for cough.   Cardiovascular:  Negative for chest pain.  Gastrointestinal:  Negative for heartburn.  Genitourinary:  Negative for dysuria.  Musculoskeletal:  Negative for myalgias.  Skin:  Negative for rash.  Neurological:  Negative for dizziness.  Psychiatric/Behavioral:  Positive for depression (Denies SI/HI, denies plan or intent to harm self or any one else outside of [facility]) and substance abuse (Educated on the negative impact of substance use on her mental health and educated on cessation). Negative for hallucinations, memory loss and suicidal ideas.  The patient is nervous/anxious (Resolving with current medications) and has insomnia (Resolved on current medications).    Blood pressure 101/73, pulse 76, temperature 98.7 F (37.1 C), temperature source  Oral, resp. rate 18, height 5\' 7"  (1.702 m), weight 61.5 kg, last menstrual period 05/20/2023, SpO2 99 %. Body mass index is 21.22 kg/m.  Mental Status Per Nursing Assessment::   On Admission:  Suicidal ideation indicated by patient  Demographic Factors:  Caucasian and Unemployed  Loss Factors: Financial problems/change in socioeconomic status  Historical Factors: Family history of mental illness or substance abuse  Risk Reduction Factors:   Positive coping skills or problem solving skills  Continued Clinical Symptoms:  Patient has showed significant improvements in depressive symptoms & anxiety since hospitalization. Opioid withdrawal symptoms have resolved, and pt currently denies SI/HI/AVH, denies paranoia, denies any intent or plan to harm self or any one outside of [facility] health after discharge. Pt verbalizes readiness to go to rehab for substance use.   Cognitive Features That Contribute To Risk:  None    Suicide Risk:  Mild:  There are no identifiable suicide plans, no associated intent, mild dysphoria and related symptoms, good self-control (both objective and subjective assessment), few other risk factors, and identifiable protective factors, including available and accessible social support.    Follow-up Information     Center, [facility]. Schedule an appointment as soon as possible for a visit.   Why: Please call this provider to personally schedule an appointment for therapy services.  Provider will need a working telephone number and address when you call. Contact information: [address] [place], [place] [address] [phone]         [facility] Schedule an appointment as soon as possible for a visit.   Why: Please call this provider to personally schedule an appointment for medication management services. Provider will need a working telephone number and address when you call. Contact information: [address] [place] [address] [phone]         [facility] Call.   Why: If you change your mind and decide to start your recovery journey, please give them a call once your insurance is straighten out. Contact information: [address] [place] [address] [phone]                Starleen Blue, NP 06/30/2023, 11:27 AM

## 2023-06-30 NOTE — Discharge Instructions (Signed)
-  Follow-up with your outpatient psychiatric provider -instructions on appointment date, time, and address (location) are provided to you in discharge paperwork.  -Take your psychiatric medications as prescribed at discharge - instructions are provided to you in the discharge paperwork  -Follow-up with outpatient primary care doctor and other specialists -for management of preventative medicine and any chronic medical disease.  -Recommend abstinence from alcohol, tobacco, and other illicit drug use at discharge.   -If your psychiatric symptoms recur, worsen, or if you have side effects to your psychiatric medications, call your outpatient psychiatric provider, 911, 988 or go to the nearest emergency department.  -If suicidal thoughts occur, call your outpatient psychiatric provider, 911, 988 or go to the nearest emergency department.  Naloxone (Narcan) can help reverse an overdose when given to the victim quickly.  [place] offers free naloxone kits and instructions/training on its use.  Add naloxone to your first aid kit and you can help save a life.   Pick up your free kit at the following locations:   [place]:  [facility], [address] [place] [address] ([phone]) [facility] [address] [place] [address] ([phone]) [facility] [address]  [place] [address]  [place]: [facility] [address] [place] [address] ([phone]) [facility] [address] [place] [address] ([phone])  

## 2023-06-30 NOTE — Progress Notes (Signed)
  [facility] Adult Case Management Discharge Plan :  Will you be returning to the same living situation after discharge:  Yes,  Patient will be returning back with her husband  At discharge, do you have transportation home?: Yes,  Patient husband will be picking her up around 11:45 AM  Do you have the ability to pay for your medications: Yes,  [employer]   Release of information consent forms completed and in the chart;  Patient's signature needed at discharge.  Patient to Follow up at:  Follow-up Information     Center, [facility]. Schedule an appointment as soon as possible for a visit.   Why: Please call this provider to personally schedule an appointment for therapy services.  Provider will need a working telephone number and address when you call. Contact information: [address] [place], [place] [address] [phone]         [facility] Schedule an appointment as soon as possible for a visit.   Why: Please call this provider to personally schedule an appointment for medication management services. Provider will need a working telephone number and address when you call. Contact information: [address] [place] [address] [phone]         [facility] Call.   Why: If you change your mind and decide to start your recovery journey, please give them a call once your insurance is straighten out. Contact information: [address] [place] [address] [phone]                 Next level of care provider has access to [facility] Link:no  Safety Planning and Suicide Prevention discussed: Verneda Skill: [phone]     Has patient been referred to the Quitline?: Patient refused referral for treatment  Patient has been referred for addiction treatment: Yes, referral information given but appointment not made for [facility] (list facility).  Isabella Bowens, LCSWA 06/30/2023, 10:46 AM

## 2023-06-30 NOTE — [facility] Suicide Risk Assessment (Signed)
[facility] INPATIENT:  Family/Significant Other Suicide Prevention Education  Suicide Prevention Education:  Education Completed; Taylor Wiggins ( husband ) [phone],  (name of family member/significant other) has been identified by the patient as the family member/significant other with whom the patient will be residing, and identified as the person(s) who will aid the patient in the event of a mental health crisis (suicidal ideations/suicide attempt).  With written consent from the patient, the family member/significant other has been provided the following suicide prevention education, prior to the and/or following the discharge of the patient.   Safety planning was completed with husband. Husband had no questions or concerns regarding patient safety or his. Husband stated that he will make sure patient takes her medication and follow up with scheduling her outpatient appointments that CSW explained to her , due to a increase in her insurance. Husband will pick patient up today around 11:45 AM.   The suicide prevention education provided includes the following: Suicide risk factors Suicide prevention and interventions National Suicide Hotline telephone number [facility] assessment telephone number [facility] Emergency Assistance 911 [facility] and/or Residential Mobile Crisis Unit telephone number  Request made of family/significant other to: Remove weapons (e.g., guns, rifles, knives), all items previously/currently identified as safety concern.   Remove drugs/medications (over-the-counter, prescriptions, illicit drugs), all items previously/currently identified as a safety concern.  The family member/significant other verbalizes understanding of the suicide prevention education information provided.  The family member/significant other agrees to remove the items of safety concern listed above.  Taylor Wiggins 06/30/2023, 10:37 AM

## 2023-06-30 NOTE — Group Note (Signed)
Recreation Therapy Group Note   Group Topic:Problem Solving  Group Date: 06/30/2023 Start Time: 0935 End Time: 1010 Facilitators: Edna Rede-McCall, LRT,CTRS Location: [address] Day[address]   Goal Area(s) Addresses:  Patient will effectively work with peer towards shared goal.  Patient will identify skills used to make activity successful.  Patient will identify how skills used during activity can be used to reach post d/c goals.   Group Description: Landing Pad. In teams of 3-5, patients were given 12 plastic drinking straws and an equal length of masking tape. Using the materials provided, patients were asked to build a landing pad to catch a golf ball dropped from approximately 5 feet in the air. All materials were required to be used by the team in their design. LRT facilitated post-activity discussion.   Affect/Mood: N/A   Participation Level: Did not attend    Clinical Observations/Individualized Feedback:      Plan: Continue to engage patient in RT group sessions 2-3x/week.   Dejia Ebron-McCall, LRT,CTRS 06/30/2023 11:32 AM

## 2023-06-30 NOTE — BHH Counselor (Signed)
B[facility]/BMU LCSW Progress Note   06/30/2023    10:41 AM  Taylor Wiggins      Type of Note: [employer]  CSW spoke with one of the admission intake coordinator about patient referral and [name] stated that patient told them to close her file and that she will reach back out to them sometime next week once her insurance is paid.     Signed:   Jacob Moores, MSW, [facility] 06/30/2023 10:41 AM

## 2023-06-30 NOTE — Progress Notes (Signed)
Patient discharged from [facility] on 06/30/23 at 1210. Patient denies SI, plan, and intention. Suicide safety plan completed, reviewed with this RN, given to the patient, and a copy in the chart. Patient denies HI/AVH upon discharge.  Patient is alert, oriented, and cooperative. RN provided patient with discharge paperwork and reviewed information with patient. Patient expressed that she understood all of the discharge instructions. Pt was satisfied with belongings returned to her from the locker and at bedside. Discharged patient to [facility] waiting room.
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
## 2023-06-30 NOTE — Group Note (Signed)
Date:  06/30/2023 Time:  10:29 AM  Group Topic/Focus:  Orientation:   The focus of this group is to educate the patient on the purpose and policies of crisis stabilization and provide a format to answer questions about their admission.  The group details unit policies and expectations of patients while admitted.    Participation Level:  Active  Participation Quality:  Appropriate  Affect:  Appropriate  Cognitive:  Alert  Insight: Appropriate  Engagement in Group:  Engaged  Modes of Intervention:  Discussion  Additional Comments:    Beckie Busing 06/30/2023, 10:29 AM

## 2023-09-28 ENCOUNTER — Encounter: Payer: Self-pay | Admitting: Family

## 2023-09-28 ENCOUNTER — Other Ambulatory Visit: Payer: Self-pay

## 2023-09-28 ENCOUNTER — Inpatient Hospital Stay
Admission: AD | Admit: 2023-09-28 | Discharge: 2023-10-03 | DRG: 885 | Disposition: A | Discharge status: Home / Self Care | Payer: 59 | Source: Intra-hospital | Attending: Psychiatry | Admitting: Psychiatry

## 2023-09-28 ENCOUNTER — Ambulatory Visit (HOSPITAL_COMMUNITY)
Admission: EM | Admit: 2023-09-28 | Discharge: 2023-09-28 | Disposition: A | Discharge status: Other Acute Inpatient Hospital | Payer: 59 | Attending: Psychiatry | Admitting: Psychiatry

## 2023-09-28 DIAGNOSIS — F17210 Nicotine dependence, cigarettes, uncomplicated: Secondary | ICD-10-CM | POA: Diagnosis present

## 2023-09-28 DIAGNOSIS — F4310 Post-traumatic stress disorder, unspecified: Secondary | ICD-10-CM | POA: Diagnosis present

## 2023-09-28 DIAGNOSIS — Z5941 Food insecurity: Secondary | ICD-10-CM | POA: Diagnosis not present

## 2023-09-28 DIAGNOSIS — F419 Anxiety disorder, unspecified: Secondary | ICD-10-CM | POA: Diagnosis not present

## 2023-09-28 DIAGNOSIS — Z833 Family history of diabetes mellitus: Secondary | ICD-10-CM | POA: Diagnosis not present

## 2023-09-28 DIAGNOSIS — F332 Major depressive disorder, recurrent severe without psychotic features: Principal | ICD-10-CM | POA: Diagnosis present

## 2023-09-28 DIAGNOSIS — Z818 Family history of other mental and behavioral disorders: Secondary | ICD-10-CM | POA: Diagnosis not present

## 2023-09-28 DIAGNOSIS — F1123 Opioid dependence with withdrawal: Secondary | ICD-10-CM | POA: Diagnosis present

## 2023-09-28 DIAGNOSIS — Z635 Disruption of family by separation and divorce: Secondary | ICD-10-CM

## 2023-09-28 DIAGNOSIS — F1590 Other stimulant use, unspecified, uncomplicated: Secondary | ICD-10-CM | POA: Diagnosis not present

## 2023-09-28 DIAGNOSIS — F1910 Other psychoactive substance abuse, uncomplicated: Secondary | ICD-10-CM | POA: Diagnosis not present

## 2023-09-28 DIAGNOSIS — Z5900 Homelessness unspecified: Secondary | ICD-10-CM

## 2023-09-28 DIAGNOSIS — J45909 Unspecified asthma, uncomplicated: Secondary | ICD-10-CM | POA: Diagnosis present

## 2023-09-28 DIAGNOSIS — F32A Depression, unspecified: Secondary | ICD-10-CM | POA: Diagnosis not present

## 2023-09-28 DIAGNOSIS — R45851 Suicidal ideations: Secondary | ICD-10-CM | POA: Diagnosis present

## 2023-09-28 LAB — TSH: TSH: 1.606 u[IU]/mL (ref 0.350–4.500)

## 2023-09-28 LAB — CBC WITH DIFFERENTIAL/PLATELET
Abs Immature Granulocytes: 0.02 10*3/uL (ref 0.00–0.07)
Basophils Absolute: 0.1 10*3/uL (ref 0.0–0.1)
Basophils Relative: 1 %
Eosinophils Absolute: 0.6 10*3/uL — ABNORMAL HIGH (ref 0.0–0.5)
Eosinophils Relative: 8 %
HCT: 40.2 % (ref 36.0–46.0)
Hemoglobin: 13 g/dL (ref 12.0–15.0)
Immature Granulocytes: 0 %
Lymphocytes Relative: 42 %
Lymphs Abs: 3 10*3/uL (ref 0.7–4.0)
MCH: 29 pg (ref 26.0–34.0)
MCHC: 32.3 g/dL (ref 30.0–36.0)
MCV: 89.7 fL (ref 80.0–100.0)
Monocytes Absolute: 0.6 10*3/uL (ref 0.1–1.0)
Monocytes Relative: 9 %
Neutro Abs: 2.8 10*3/uL (ref 1.7–7.7)
Neutrophils Relative %: 40 %
Platelets: 461 10*3/uL — ABNORMAL HIGH (ref 150–400)
RBC: 4.48 MIL/uL (ref 3.87–5.11)
RDW: 13.5 % (ref 11.5–15.5)
WBC: 7 10*3/uL (ref 4.0–10.5)
nRBC: 0 % (ref 0.0–0.2)

## 2023-09-28 LAB — MAGNESIUM: Magnesium: 2.3 mg/dL (ref 1.7–2.4)

## 2023-09-28 LAB — POCT URINE DRUG SCREEN - MANUAL ENTRY (I-SCREEN)
POC Amphetamine UR: POSITIVE — AB
POC Buprenorphine (BUP): POSITIVE — AB
POC Cocaine UR: NOT DETECTED
POC Marijuana UR: POSITIVE — AB
POC Methadone UR: NOT DETECTED
POC Methamphetamine UR: POSITIVE — AB
POC Morphine: POSITIVE — AB
POC Oxazepam (BZO): POSITIVE — AB
POC Oxycodone UR: NOT DETECTED
POC Secobarbital (BAR): NOT DETECTED

## 2023-09-28 LAB — COMPREHENSIVE METABOLIC PANEL
ALT: 165 U/L — ABNORMAL HIGH (ref 0–44)
AST: 87 U/L — ABNORMAL HIGH (ref 15–41)
Albumin: 3.7 g/dL (ref 3.5–5.0)
Alkaline Phosphatase: 76 U/L (ref 38–126)
Anion gap: 11 (ref 5–15)
BUN: 16 mg/dL (ref 6–20)
CO2: 27 mmol/L (ref 22–32)
Calcium: 9.5 mg/dL (ref 8.9–10.3)
Chloride: 103 mmol/L (ref 98–111)
Creatinine, Ser: 0.82 mg/dL (ref 0.44–1.00)
GFR, Estimated: >60 mL/min (ref >60)
Glucose, Bld: 112 mg/dL — ABNORMAL HIGH (ref 70–99)
Potassium: 4.2 mmol/L (ref 3.5–5.1)
Sodium: 141 mmol/L (ref 135–145)
Total Bilirubin: 1.2 mg/dL (ref 0.3–1.2)
Total Protein: 7.5 g/dL (ref 6.5–8.1)

## 2023-09-28 LAB — ETHANOL: Alcohol, Ethyl (B): <10 mg/dL (ref <10)

## 2023-09-28 LAB — LIPID PANEL
Cholesterol: 194 mg/dL (ref 0–200)
HDL: 65 mg/dL (ref >40)
LDL Cholesterol: 119 mg/dL — ABNORMAL HIGH (ref 0–99)
Total CHOL/HDL Ratio: 3 {ratio}
Triglycerides: 52 mg/dL (ref <150)
VLDL: 10 mg/dL (ref 0–40)

## 2023-09-28 LAB — HEMOGLOBIN A1C
Hgb A1c MFr Bld: 5.8 % — ABNORMAL HIGH (ref 4.8–5.6)
Mean Plasma Glucose: 119.76 mg/dL

## 2023-09-28 LAB — RPR: RPR Ser Ql: NONREACTIVE

## 2023-09-28 MED ORDER — MAGNESIUM HYDROXIDE 400 MG/5ML PO SUSP: 30.0000 mL | Freq: Every day | ORAL | Status: DC | PRN

## 2023-09-28 MED ORDER — DICYCLOMINE HCL 20 MG PO TABS: 20.0000 mg | ORAL_TABLET | Freq: Four times a day (QID) | ORAL | Status: DC | PRN

## 2023-09-28 MED ORDER — DIPHENHYDRAMINE HCL 25 MG PO CAPS: 50.0000 mg | ORAL_CAPSULE | Freq: Three times a day (TID) | ORAL | Status: DC | PRN

## 2023-09-28 MED ORDER — LORAZEPAM 2 MG PO TABS: 2.0000 mg | ORAL_TABLET | Freq: Three times a day (TID) | ORAL | Status: DC | PRN

## 2023-09-28 MED ORDER — DIPHENHYDRAMINE HCL 50 MG/ML IJ SOLN: 50.0000 mg | Freq: Three times a day (TID) | INTRAMUSCULAR | Status: DC | PRN

## 2023-09-28 MED ORDER — HALOPERIDOL LACTATE 5 MG/ML IJ SOLN: 5.0000 mg | Freq: Three times a day (TID) | INTRAMUSCULAR | Status: DC | PRN

## 2023-09-28 MED ORDER — LORAZEPAM 2 MG/ML IJ SOLN: 2.0000 mg | Freq: Three times a day (TID) | INTRAMUSCULAR | Status: DC | PRN

## 2023-09-28 MED ORDER — ONDANSETRON 4 MG PO TBDP: 4.0000 mg | ORAL_TABLET | Freq: Four times a day (QID) | ORAL | Status: AC | PRN

## 2023-09-28 MED ORDER — METHOCARBAMOL 500 MG PO TABS: 500.0000 mg | ORAL_TABLET | Freq: Three times a day (TID) | ORAL | Status: AC | PRN

## 2023-09-28 MED ORDER — LOPERAMIDE HCL 2 MG PO CAPS: 2.0000 mg | ORAL_CAPSULE | ORAL | Status: AC | PRN

## 2023-09-28 MED ORDER — METHOCARBAMOL 500 MG PO TABS: 500.0000 mg | ORAL_TABLET | Freq: Three times a day (TID) | ORAL | Status: DC | PRN

## 2023-09-28 MED ORDER — NAPROXEN 500 MG PO TABS: 500.0000 mg | ORAL_TABLET | Freq: Two times a day (BID) | ORAL | Status: DC | PRN

## 2023-09-28 MED ORDER — NAPROXEN 500 MG PO TABS
500.0000 mg | ORAL_TABLET | Freq: Two times a day (BID) | ORAL | Status: AC | PRN
  Administered 2023-09-30: 500 mg via ORAL
  Filled 2023-09-28: qty 1

## 2023-09-28 MED ORDER — HALOPERIDOL 5 MG PO TABS: 5.0000 mg | ORAL_TABLET | Freq: Three times a day (TID) | ORAL | Status: DC | PRN

## 2023-09-28 MED ORDER — HYDROXYZINE HCL 25 MG PO TABS
25.0000 mg | ORAL_TABLET | Freq: Four times a day (QID) | ORAL | Status: AC | PRN
  Administered 2023-10-01: 25 mg via ORAL
  Filled 2023-09-28: qty 1

## 2023-09-28 MED ORDER — DICYCLOMINE HCL 20 MG PO TABS: 20.0000 mg | ORAL_TABLET | Freq: Four times a day (QID) | ORAL | Status: AC | PRN

## 2023-09-28 MED ORDER — ALUM & MAG HYDROXIDE-SIMETH 200-200-20 MG/5ML PO SUSP: 30.0000 mL | ORAL | Status: DC | PRN

## 2023-09-28 MED ORDER — LOPERAMIDE HCL 2 MG PO CAPS: 2.0000 mg | ORAL_CAPSULE | ORAL | Status: DC | PRN

## 2023-09-28 MED ORDER — ACETAMINOPHEN 325 MG PO TABS: 650.0000 mg | ORAL_TABLET | Freq: Four times a day (QID) | ORAL | Status: DC | PRN

## 2023-09-28 MED ORDER — ONDANSETRON 4 MG PO TBDP: 4.0000 mg | ORAL_TABLET | Freq: Four times a day (QID) | ORAL | Status: DC | PRN

## 2023-09-28 MED ORDER — HYDROXYZINE HCL 25 MG PO TABS: 25.0000 mg | ORAL_TABLET | Freq: Four times a day (QID) | ORAL | Status: DC | PRN
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
## 2023-09-28 NOTE — Progress Notes (Signed)
Admission note:  Consents signed, literature detailing the patient's rights, responsibilities, and visitor guidelines provided. Skin search unremarkable-old scars to right wrist. Belongings search completed no contraband found.She denies SI at present but endorses anxiety and depression. States she wants to go to long term rehab. She states she was living with her mother and her mother won't let her back home. She endorses substance use disorder. States she is "a little dope sick". Denies ETOH use. States she was thinking before going to [facility] about "sitting on the train tracks". She was oriented to her room and the unit. She contracts for safety and remains safe on the unit at this time.

## 2023-09-28 NOTE — Progress Notes (Signed)
   09/28/23 0157  [facility] Triage Screening (Walk-ins at [facility] only)  How Did You Hear About [place]? Self  What Is the Reason for Your Visit/Call Today? Patient presents to [facility] ovluntarily. Patient reports being unstable and feels isolated from family due to her relationship with her husband. Patient reports SI at this time with a plan to sit on the railroad tracks. Patient denies HI and AVH at this time. Patient reports opiod use. Patient reports not taking depression and anxiety medications. Patients speech is rapid and disorganzied. Patient is routine.  How Long Has This Been Causing You Problems? 1 wk - 1 month  Have You Recently Had Any Thoughts About Hurting Yourself? Yes  How long ago did you have thoughts about hurting yourself? Today  Are You Planning to Commit Suicide/Harm Yourself At This time? Yes  Have you Recently Had Thoughts About Hurting Someone Karolee Ohs? No  Are You Planning To Harm Someone At This Time? No  Are you currently experiencing any auditory, visual or other hallucinations? No  Have You Used Any Alcohol or Drugs in the Past 24 Hours? Yes  How long ago did you use Drugs or Alcohol? Yesterday  What Did You Use and How Much? Opiod/ Suboxene  Do you have any current medical co-morbidities that require immediate attention? No  Clinician description of patient physical appearance/behavior: Patient is fairly groomed and cooperative. Patients speech is rapid and disorganzied.  What Do You Feel Would Help You the Most Today? Treatment for Depression or other mood problem;Alcohol or Drug Use Treatment;Social Support;Support for unsafe relationship  If access to [facility] Urgent Care was not available, would you have sought care in the Emergency Department? No  Determination of Need Routine (7 days)  Options For Referral Chemical Dependency Intensive Outpatient Therapy (CDIOP);Partial Hospitalization;Facility-Based Crisis;BH Urgent Care

## 2023-09-28 NOTE — ED Notes (Signed)
Pt A&O x 4, presents with suicidal ideations, plan to sit on railroad tracks.  Denies HI or AVH.  Comfort measures given.  Monitoring for safety.

## 2023-09-28 NOTE — Tx Team (Signed)
Initial Treatment Plan 09/28/2023 5:12 PM Taylor Wiggins [id number]    PATIENT STRESSORS: Financial difficulties   Marital or family conflict   Medication change or noncompliance   Substance abuse     PATIENT STRENGTHS: Ability for insight  Average or above average intelligence  Capable of independent living  General fund of knowledge  Physical Health    PATIENT IDENTIFIED PROBLEMS: Substance use disorder  Homelessness  Financial difficulties  Family stressors               DISCHARGE CRITERIA:  Ability to meet basic life and health needs Adequate post-discharge living arrangements Improved stabilization in mood, thinking, and/or behavior Reduction of life-threatening or endangering symptoms to within safe limits Verbal commitment to aftercare and medication compliance  PRELIMINARY DISCHARGE PLAN: Attend 12-step recovery group Outpatient therapy Return to previous living arrangement  PATIENT/FAMILY INVOLVEMENT: This treatment plan has been presented to and reviewed with the patient, Taylor Wiggins..  The patient has been given the opportunity to ask questions and make suggestions.  Delos Haring, RN 09/28/2023, 5:12 PM

## 2023-09-28 NOTE — ED Notes (Addendum)
Report called to [facility] RN at [facility]. Safe transport notified for transportation.

## 2023-09-28 NOTE — Plan of Care (Signed)
Problem: Education: Goal: Knowledge of [facility] Education information/materials will improve Outcome: Not Progressing Goal: Emotional status will improve Outcome: Not Progressing Goal: Mental status will improve Outcome: Not Progressing Goal: Verbalization of understanding the information provided will improve Outcome: Not Progressing   Problem: Activity: Goal: Interest or engagement in activities will improve Outcome: Not Progressing Goal: Sleeping patterns will improve Outcome: Not Progressing   Problem: Coping: Goal: Ability to verbalize frustrations and anger appropriately will improve Outcome: Not Progressing Goal: Ability to demonstrate self-control will improve Outcome: Not Progressing   Problem: Health Behavior/Discharge Planning: Goal: Identification of resources available to assist in meeting health care needs will improve Outcome: Not Progressing Goal: Compliance with treatment plan for underlying cause of condition will improve Outcome: Not Progressing   Problem: Physical Regulation: Goal: Ability to maintain clinical measurements within normal limits will improve Outcome: Not Progressing   Problem: Safety: Goal: Periods of time without injury will increase Outcome: Not Progressing   Problem: Education: Goal: Knowledge of General Education information will improve Description: Including pain rating scale, medication(s)/side effects and non-pharmacologic comfort measures Outcome: Not Progressing   Problem: Health Behavior/Discharge Planning: Goal: Ability to manage health-related needs will improve Outcome: Not Progressing   Problem: Clinical Measurements: Goal: Ability to maintain clinical measurements within normal limits will improve Outcome: Not Progressing Goal: Will remain free from infection Outcome: Not Progressing Goal: Diagnostic test results will improve Outcome: Not Progressing Goal: Respiratory complications will  improve Outcome: Not Progressing Goal: Cardiovascular complication will be avoided Outcome: Not Progressing   Problem: Activity: Goal: Risk for activity intolerance will decrease Outcome: Not Progressing   Problem: Nutrition: Goal: Adequate nutrition will be maintained Outcome: Not Progressing   Problem: Coping: Goal: Level of anxiety will decrease Outcome: Not Progressing   Problem: Elimination: Goal: Will not experience complications related to bowel motility Outcome: Not Progressing Goal: Will not experience complications related to urinary retention Outcome: Not Progressing   Problem: Pain Managment: Goal: General experience of comfort will improve Outcome: Not Progressing   Problem: Safety: Goal: Ability to remain free from injury will improve Outcome: Not Progressing   Problem: Skin Integrity: Goal: Risk for impaired skin integrity will decrease Outcome: Not Progressing   Problem: Education: Goal: Ability to make informed decisions regarding treatment will improve Outcome: Not Progressing   Problem: Coping: Goal: Coping ability will improve Outcome: Not Progressing   Problem: Health Behavior/Discharge Planning: Goal: Identification of resources available to assist in meeting health care needs will improve Outcome: Not Progressing   Problem: Medication: Goal: Compliance with prescribed medication regimen will improve Outcome: Not Progressing   Problem: Self-Concept: Goal: Ability to disclose and discuss suicidal ideas will improve Outcome: Not Progressing Goal: Will verbalize positive feelings about self Outcome: Not Progressing Note:     Problem: Education: Goal: Utilization of techniques to improve thought processes will improve Outcome: Not Progressing Goal: Knowledge of the prescribed therapeutic regimen will improve Outcome: Not Progressing   Problem: Activity: Goal: Interest or engagement in leisure activities will improve Outcome: Not  Progressing Goal: Imbalance in normal sleep/wake cycle will improve Outcome: Not Progressing   Problem: Coping: Goal: Coping ability will improve Outcome: Not Progressing Goal: Will verbalize feelings Outcome: Not Progressing   Problem: Health Behavior/Discharge Planning: Goal: Ability to make decisions will improve Outcome: Not Progressing Goal: Compliance with therapeutic regimen will improve Outcome: Not Progressing   Problem: Role Relationship: Goal: Will demonstrate positive changes in social behaviors and relationships Outcome: Not Progressing   Problem: Safety: Goal:  Ability to disclose and discuss suicidal ideas will improve Outcome: Not Progressing Goal: Ability to identify and utilize support systems that promote safety will improve Outcome: Not Progressing   Problem: Self-Concept: Goal: Will verbalize positive feelings about self Outcome: Not Progressing Goal: Level of anxiety will decrease Outcome: Not Progressing

## 2023-09-28 NOTE — ED Triage Notes (Signed)
Patient presents to [facility] ovluntarily. Patient reports being unstable and feels isolated from family due to her relationship with her husband. Patient reports SI at this time with [place]. Patient denies HI and AVH at this time. Patient reports opiod use. Patient reports not taking depression and anxiety medications. Patients speech is rapid and disorganzied. Patient is routine.

## 2023-09-28 NOTE — ED Provider Notes (Signed)
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 mmol/L Final   Potassium 04/24/2023 3.6  3.5 - 5.1 mmol/L Final   Chloride 04/24/2023 99  98 - 111 mmol/L Final   CO2 04/24/2023 28  22 - 32 mmol/L Final   Glucose, Bld 04/24/2023 89  70 - 99 mg/dL Final   Glucose reference range applies only  to samples taken after fasting for at least 8 hours.   BUN 04/24/2023 11  6 - 20 mg/dL Final   Creatinine, Ser 04/24/2023 0.76  0.44 - 1.00 mg/dL Final   Calcium 95/62/1308 9.0  8.9 - 10.3 mg/dL Final   GFR, Estimated 04/24/2023 >60  >60 mL/min Final   Comment: (NOTE) Calculated using the CKD-EPI Creatinine Equation (2021)    Anion gap 04/24/2023 6  5 - 15 Final   Performed at [facility], [address], [place], [place] [address]   Lactic Acid, Venous 04/24/2023 0.6  0.5 - 1.9 mmol/L Final   Performed at [facility], [address], [place], [place] [address]   Opiates 04/24/2023 NONE DETECTED  NONE DETECTED Final   Cocaine 04/24/2023 NONE DETECTED  NONE DETECTED Final   Benzodiazepines 04/24/2023 NONE DETECTED  NONE DETECTED Final   Amphetamines 04/24/2023 POSITIVE (A)  NONE DETECTED Final   Tetrahydrocannabinol 04/24/2023 NONE DETECTED  NONE DETECTED Final   Barbiturates 04/24/2023 NONE DETECTED  NONE DETECTED Final   Comment: (NOTE) DRUG SCREEN FOR MEDICAL PURPOSES ONLY.  IF CONFIRMATION IS NEEDED FOR ANY PURPOSE, NOTIFY LAB WITHIN 5 DAYS.  LOWEST DETECTABLE LIMITS FOR URINE DRUG SCREEN Drug Class                     Cutoff (ng/mL) Amphetamine and metabolites    1000 Barbiturate and metabolites    200 Benzodiazepine                 200 Opiates and metabolites        300 Cocaine and metabolites  300 THC                            50 Performed at [facility], [address] [place], [place] [address]    Preg Test, Ur 04/24/2023 NEGATIVE  NEGATIVE Final   Comment:        THE SENSITIVITY OF THIS METHODOLOGY IS >20 mIU/mL. Performed at [facility], [address] [place], [place] [address]    WBC 04/24/2023 12.6 (H)  4.0 - 10.5 K/uL Final   RBC 04/24/2023 3.90  3.87 - 5.11 MIL/uL Final   Hemoglobin 04/24/2023 12.3  12.0 - 15.0 g/dL Final   HCT 09/81/1914 37.0  36.0 - 46.0 % Final    MCV 04/24/2023 94.9  80.0 - 100.0 fL Final   MCH 04/24/2023 31.5  26.0 - 34.0 pg Final   MCHC 04/24/2023 33.2  30.0 - 36.0 g/dL Final   RDW 78/29/5621 14.1  11.5 - 15.5 % Final   Platelets 04/24/2023 322  150 - 400 K/uL Final   nRBC 04/24/2023 0.0  0.0 - 0.2 % Final   Performed at [facility], [address], [place], [place] [address]   Creatinine, Ser 04/24/2023 0.82  0.44 - 1.00 mg/dL Final   GFR, Estimated 04/24/2023 >60  >60 mL/min Final   Comment: (NOTE) Calculated using the CKD-EPI Creatinine Equation (2021) Performed at [facility], [address], [place], [place] [address]    Total Protein 04/24/2023 7.2  6.5 - 8.1 g/dL Final   Albumin 62/95/2841 3.8  3.5 - 5.0 g/dL Final   AST 32/44/0102 85 (H)  15 - 41 U/L Final   ALT 04/24/2023 174 (H)  0 - 44 U/L Final   Alkaline Phosphatase 04/24/2023 67  38 - 126 U/L Final   Total Bilirubin 04/24/2023 1.2  0.3 - 1.2 mg/dL Final   Bilirubin, Direct 04/24/2023 0.1  0.0 - 0.2 mg/dL Final   Indirect Bilirubin 04/24/2023 1.1 (H)  0.3 - 0.9 mg/dL Final   Performed at [facility], [address], [place], [place] [address]   Sodium 04/25/2023 133 (L)  135 - 145 mmol/L Final   Potassium 04/25/2023 4.3  3.5 - 5.1 mmol/L Final   Chloride 04/25/2023 102  98 - 111 mmol/L Final   CO2 04/25/2023 23  22 - 32 mmol/L Final   Glucose, Bld 04/25/2023 117 (H)  70 - 99 mg/dL Final   Glucose reference range applies only to samples taken after fasting for at least 8 hours.   BUN 04/25/2023 12  6 - 20 mg/dL Final   Creatinine, Ser 04/25/2023 0.74  0.44 - 1.00 mg/dL Final   Calcium 64/40/3474 8.4 (L)  8.9 - 10.3 mg/dL Final   GFR, Estimated 04/25/2023 >60  >60 mL/min Final   Comment: (NOTE) Calculated using the CKD-EPI Creatinine Equation (2021)    Anion gap 04/25/2023 8  5 - 15 Final   Performed at [facility], [address], [place], [place] [address]   WBC 04/25/2023  9.0  4.0 - 10.5 K/uL Final   RBC 04/25/2023 3.63 (L)  3.87 - 5.11 MIL/uL Final   Hemoglobin 04/25/2023 11.3 (L)  12.0 - 15.0 g/dL Final   HCT 38/75/6433 34.8 (L)  36.0 - 46.0 % Final   MCV 04/25/2023 95.9  80.0 - 100.0 fL Final   MCH 04/25/2023 31.1  26.0 - 34.0 pg Final   MCHC  04/25/2023 32.5  30.0 - 36.0 g/dL Final   RDW 69/62/9528 14.2  11.5 - 15.5 % Final   Platelets 04/25/2023 288  150 - 400 K/uL Final   nRBC 04/25/2023 0.0  0.0 - 0.2 % Final   Performed at [facility], [address], [place], [place] [address]   Sodium 04/26/2023 137  135 - 145 mmol/L Final   Potassium 04/26/2023 4.4  3.5 - 5.1 mmol/L Final   Chloride 04/26/2023 108  98 - 111 mmol/L Final   CO2 04/26/2023 22  22 - 32 mmol/L Final   Glucose, Bld 04/26/2023 126 (H)  70 - 99 mg/dL Final   Glucose reference range applies only to samples taken after fasting for at least 8 hours.   BUN 04/26/2023 12  6 - 20 mg/dL Final   Creatinine, Ser 04/26/2023 0.67  0.44 - 1.00 mg/dL Final   Calcium 40/09/2724 8.3 (L)  8.9 - 10.3 mg/dL Final   Phosphorus 36/64/4034 2.5  2.5 - 4.6 mg/dL Final   Albumin 74/25/9563 3.0 (L)  3.5 - 5.0 g/dL Final   GFR, Estimated 04/26/2023 >60  >60 mL/min Final   Comment: (NOTE) Calculated using the CKD-EPI Creatinine Equation (2021)    Anion gap 04/26/2023 7  5 - 15 Final   Performed at [facility], [address], [place], [place] [address]   Phosphorus 04/26/2023 2.6  2.5 - 4.6 mg/dL Final   Performed at [facility], [address] [place], [place] [address]   Sed Rate 04/26/2023 35 (H)  0 - 22 mm/hr Final   Performed at [facility], [address] [place], [place] [address]    Allergies: Mold extract [trichophyton]  Medications:  PTA Medications  Medication Sig   QUEtiapine (SEROQUEL) 100 MG tablet Take 1 tablet (100 mg total) by mouth at bedtime.   sertraline (ZOLOFT) 100 MG tablet Take 1 tablet (100 mg  total) by mouth daily.   albuterol (VENTOLIN HFA) 108 (90 Base) MCG/ACT inhaler Inhale 2 puffs into the lungs every 6 (six) hours as needed for wheezing or shortness of breath.      Medical Decision Making  Admit to Observation unit for overnight monitoring. To be reevaluated in AM to determine disposition. Initiate Clonidine detox protocol  Acetaminophen 650 mg PO Q 6hrs PRN Maalox 30 ml PO Q 4 hrs PRN Milk of Magnesia 30 ml PO Daily PRN  Labs  CBC, CMP, RPR, TSH, UPT, UDS, Hepatic function panel, Lipid panel  Ethanol, Magnesium, UA  EKG  Recommendations  Based on my evaluation the patient does not appear to have an emergency medical condition.  Olin Pia, NP 09/28/23  2:56 AM

## 2023-09-28 NOTE — Tx Team (Signed)
Initial Treatment Plan 09/28/2023 5:04 PM [name] WUJ:[id number]    PATIENT STRESSORS: Financial difficulties   Marital or family conflict   Medication change or noncompliance   Substance abuse     PATIENT STRENGTHS: Ability for insight  Communication skills  General fund of knowledge  Motivation for treatment/growth  Physical Health    PATIENT IDENTIFIED PROBLEMS:                      DISCHARGE CRITERIA:  Adequate post-discharge living arrangements Improved stabilization in mood, thinking, and/or behavior Reduction of life-threatening or endangering symptoms to within safe limits Verbal commitment to aftercare and medication compliance  PRELIMINARY DISCHARGE PLAN: Attend 12-step recovery group Outpatient therapy Return to previous living arrangement  PATIENT/FAMILY INVOLVEMENT: This treatment plan has been presented to and reviewed with the patient, Taylor Wiggins.  The patient and family have been given the opportunity to ask questions and make suggestions.  Delos Haring, RN 09/28/2023, 5:04 PM

## 2023-09-28 NOTE — Group Note (Signed)
Date:  09/28/2023 Time:  3:21 PM  Group Topic/Focus:  Activity Group:  The focus of the group to promote activity for the patients to go outside in the courtyard and get some exercise and some fresh air.    Participation Level:  Did Not Attend  Taylor Wiggins Sella Crystal Ellwood 09/28/2023, 3:21 PM

## 2023-09-28 NOTE — ED Notes (Signed)
Voluntary consent form signed and placed in e-chart.

## 2023-09-29 DIAGNOSIS — F332 Major depressive disorder, recurrent severe without psychotic features: Secondary | ICD-10-CM | POA: Diagnosis not present

## 2023-09-29 MED ORDER — TRAZODONE HCL 100 MG PO TABS
100.0000 mg | ORAL_TABLET | Freq: Every evening | ORAL | Status: DC | PRN
  Filled 2023-09-29 (×2): qty 1

## 2023-09-29 MED ORDER — QUETIAPINE FUMARATE 200 MG PO TABS
200.0000 mg | ORAL_TABLET | Freq: Every day | ORAL | Status: DC
  Administered 2023-09-29 – 2023-10-02 (×2): 200 mg via ORAL
  Filled 2023-09-29 (×4): qty 1

## 2023-09-29 MED ORDER — SERTRALINE HCL 25 MG PO TABS
50.0000 mg | ORAL_TABLET | Freq: Every day | ORAL | Status: DC
  Administered 2023-09-29 – 2023-10-02 (×4): 50 mg via ORAL
  Filled 2023-09-29 (×4): qty 2

## 2023-09-29 MED ORDER — ALBUTEROL SULFATE HFA 108 (90 BASE) MCG/ACT IN AERS: 2.0000 | INHALATION_SPRAY | RESPIRATORY_TRACT | Status: DC | PRN

## 2023-09-29 MED ORDER — PHENOBARBITAL 32.4 MG PO TABS
32.4000 mg | ORAL_TABLET | Freq: Three times a day (TID) | ORAL | Status: DC
  Administered 2023-09-29 – 2023-09-30 (×4): 32.4 mg via ORAL
  Filled 2023-09-29 (×4): qty 1

## 2023-09-29 NOTE — Plan of Care (Signed)
  Problem: Education: Goal: Knowledge of [facility] Education information/materials will improve Outcome: Progressing Goal: Emotional status will improve Outcome: Progressing Goal: Mental status will improve Outcome: Progressing Goal: Verbalization of understanding the information provided will improve Outcome: Progressing   Problem: Activity: Goal: Interest or engagement in activities will improve Outcome: Progressing Goal: Sleeping patterns will improve Outcome: Progressing   Problem: Coping: Goal: Ability to verbalize frustrations and anger appropriately will improve Outcome: Progressing Goal: Ability to demonstrate self-control will improve Outcome: Progressing   Problem: Coping: Goal: Ability to verbalize frustrations and anger appropriately will improve Outcome: Progressing Goal: Ability to demonstrate self-control will improve Outcome: Progressing

## 2023-09-29 NOTE — Group Note (Signed)
Recreation Therapy Group Note   Group Topic:General Recreation  Group Date: 09/29/2023 Start Time: 1040 End Time: 1130 Facilitators: Rosina Lowenstein, LRT, CTRS Location: Courtyard  Group Description: Outdoor Recreation. Patients had the option to play basketball, corn hole, or draw with chalk and listen to music while outside in the courtyard getting fresh air and sunlight. LRT and pts discussed things that they enjoy doing in their free time outside of the hospital.   Goal Area(s) Addressed: Patient will identify leisure interests.  Patient will practice healthy decision making. Patient will engage in recreation activity.   Affect/Mood: N/A   Participation Level: Did not attend    Clinical Observations/Individualized Feedback: Taylor Wiggins did not attend group.  Plan: Continue to engage patient in RT group sessions 2-3x/week.   Rosina Lowenstein, LRT, CTRS 09/29/2023 11:42 AM

## 2023-09-29 NOTE — Progress Notes (Signed)
Patient isolative to self and room. Did not come out for snack or meds. Reports bad withdrawal symptoms. No interaction with peers, minimal with staff. Encouragement and support provided. Safety checks maintained. Meds given as prescribed. Pt remains safe on unit with q 15 min checks.
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
## 2023-09-29 NOTE — Progress Notes (Signed)
Nursing Shift Note:  1900-0700  Attended Evening Group: yes Medication Compliant:  n/a Behavior: isolative Sleep Quality: good Significant Changes: none noted   09/29/23 0500  Psych Admission Type (Psych Patients Only)  Admission Status Voluntary  Psychosocial Assessment  Patient Complaints Anxiety  Eye Contact Fair  Facial Expression Anxious  Affect Anxious;Blunted  Speech Slow  Interaction Assertive  Motor Activity Slow  Appearance/Hygiene Disheveled  Behavior Characteristics Cooperative;Appropriate to situation  Mood Depressed;Anxious  Thought Process  Coherency WDL  Content WDL  Delusions None reported or observed  Perception WDL  Hallucination None reported or observed  Judgment Impaired  Confusion None  Danger to Self  Current suicidal ideation? Denies  Danger to Others  Danger to Others None reported or observed

## 2023-09-29 NOTE — BH IP Treatment Plan (Signed)
Interdisciplinary Treatment and Diagnostic Plan Update  09/29/2023 Time of Session: 9:42 AM  SHALICIA CRAGHEAD MRN: [id number]  Principal Diagnosis: MDD (major depressive disorder), recurrent severe, without psychosis (HCC)  Secondary Diagnoses: Principal Problem:   MDD (major depressive disorder), recurrent severe, without psychosis (HCC)   Current Medications:  Current Facility-Administered Medications  Medication Dose Route Frequency Provider Last Rate Last Admin   dicyclomine (BENTYL) tablet 20 mg  20 mg Oral Q6H PRN Lenard Lance, FNP       diphenhydrAMINE (BENADRYL) capsule 50 mg  50 mg Oral TID PRN Lenard Lance, FNP       Or   diphenhydrAMINE (BENADRYL) injection 50 mg  50 mg Intramuscular TID PRN Lenard Lance, FNP       haloperidol (HALDOL) tablet 5 mg  5 mg Oral TID PRN Lenard Lance, FNP       Or   haloperidol lactate (HALDOL) injection 5 mg  5 mg Intramuscular TID PRN Lenard Lance, FNP       hydrOXYzine (ATARAX) tablet 25 mg  25 mg Oral Q6H PRN Lenard Lance, FNP       loperamide (IMODIUM) capsule 2-4 mg  2-4 mg Oral PRN Lenard Lance, FNP       LORazepam (ATIVAN) tablet 2 mg  2 mg Oral TID PRN Lenard Lance, FNP       Or   LORazepam (ATIVAN) injection 2 mg  2 mg Intramuscular TID PRN Lenard Lance, FNP       methocarbamol (ROBAXIN) tablet 500 mg  500 mg Oral Q8H PRN Lenard Lance, FNP       naproxen (NAPROSYN) tablet 500 mg  500 mg Oral BID PRN Lenard Lance, FNP       ondansetron (ZOFRAN-ODT) disintegrating tablet 4 mg  4 mg Oral Q6H PRN Lenard Lance, FNP       PTA Medications: Medications Prior to Admission  Medication Sig Dispense Refill Last Dose   albuterol (VENTOLIN HFA) 108 (90 Base) MCG/ACT inhaler Inhale 2 puffs into the lungs every 6 (six) hours as needed for wheezing or shortness of breath. 1 each 0     Patient Stressors: Financial difficulties   Marital or family conflict   Medication change or noncompliance   Substance abuse    Patient Strengths:  Ability for insight  Average or above average intelligence  Capable of independent living  General fund of knowledge  Physical Health   Treatment Modalities: Medication Management, Group therapy, Case management,  1 to 1 session with clinician, Psychoeducation, Recreational therapy.   Physician Treatment Plan for Primary Diagnosis: MDD (major depressive disorder), recurrent severe, without psychosis (HCC) Long Term Goal(s):     Short Term Goals:    Medication Management: Evaluate patient's response, side effects, and tolerance of medication regimen.  Therapeutic Interventions: 1 to 1 sessions, Unit Group sessions and Medication administration.  Evaluation of Outcomes: Progressing  Physician Treatment Plan for Secondary Diagnosis: Principal Problem:   MDD (major depressive disorder), recurrent severe, without psychosis (HCC)  Long Term Goal(s):     Short Term Goals:       Medication Management: Evaluate patient's response, side effects, and tolerance of medication regimen.  Therapeutic Interventions: 1 to 1 sessions, Unit Group sessions and Medication administration.  Evaluation of Outcomes: Progressing   RN Treatment Plan for Primary Diagnosis: MDD (major depressive disorder), recurrent severe, without psychosis (HCC) Long Term Goal(s): Knowledge of disease and therapeutic regimen to maintain  health will improve  Short Term Goals: Ability to remain free from injury will improve, Ability to verbalize frustration and anger appropriately will improve, Ability to demonstrate self-control, Ability to participate in decision making will improve, Ability to verbalize feelings will improve, Ability to disclose and discuss suicidal ideas, Ability to identify and develop effective coping behaviors will improve, and Compliance with prescribed medications will improve  Medication Management: RN will administer medications as ordered by provider, will assess and evaluate patient's response  and provide education to patient for prescribed medication. RN will report any adverse and/or side effects to prescribing provider.  Therapeutic Interventions: 1 on 1 counseling sessions, Psychoeducation, Medication administration, Evaluate responses to treatment, Monitor vital signs and CBGs as ordered, Perform/monitor CIWA, COWS, AIMS and Fall Risk screenings as ordered, Perform wound care treatments as ordered.  Evaluation of Outcomes: Progressing   LCSW Treatment Plan for Primary Diagnosis: MDD (major depressive disorder), recurrent severe, without psychosis (HCC) Long Term Goal(s): Safe transition to appropriate next level of care at discharge, Engage patient in therapeutic group addressing interpersonal concerns.  Short Term Goals: Engage patient in aftercare planning with referrals and resources, Increase social support, Increase ability to appropriately verbalize feelings, Increase emotional regulation, Facilitate acceptance of mental health diagnosis and concerns, Facilitate patient progression through stages of change regarding substance use diagnoses and concerns, Identify triggers associated with mental health/substance abuse issues, and Increase skills for wellness and recovery  Therapeutic Interventions: Assess for all discharge needs, 1 to 1 time with Social worker, Explore available resources and support systems, Assess for adequacy in community support network, Educate family and significant other(s) on suicide prevention, Complete Psychosocial Assessment, Interpersonal group therapy.  Evaluation of Outcomes: Progressing   Progress in Treatment: Attending groups: No. Participating in groups: No. Taking medication as prescribed: Yes. Toleration medication: Yes. Family/Significant other contact made: No, will contact:  CSW will contact if given permission  Patient understands diagnosis: Yes. Discussing patient identified problems/goals with staff: Yes. Medical problems  stabilized or resolved: Yes. Denies suicidal/homicidal ideation: Yes. Issues/concerns per patient self-inventory: No. Other: None   New problem(s) identified: No, Describe:  None identified   New Short Term/Long Term Goal(s): detox, elimination of symptoms of psychosis, medication management for mood stabilization; elimination of SI thoughts; development of comprehensive mental wellness/sobriety plan.   Patient Goals:  " Just to get all the drugs out of my system, get some referrals to a long-term treatment program"   Discharge Plan or Barriers: CSW will assist with appropriate discharge planning   Reason for Continuation of Hospitalization: Depression Medication stabilization Withdrawal symptoms  Estimated Length of Stay: 1 to 7 days   Last 3 [place] Suicide Severity Risk Score: Flowsheet Row Admission (Current) from 09/28/2023 in [facility] Most recent reading at 09/28/2023  4:35 PM ED from 09/28/2023 in [facility] Most recent reading at 09/28/2023  3:31 AM [facility] Most recent reading at 06/22/2023  5:30 PM  C-SSRS RISK CATEGORY No Risk High Risk Moderate Risk       Last PHQ 2/9 Scores:    09/28/2023    2:47 AM 12/05/2022    2:54 PM 07/19/2022    8:06 PM  Depression screen PHQ 2/9  Decreased Interest 2 0 2  Down, Depressed, Hopeless 2 0 3  PHQ - 2 Score 4 0 5  Altered sleeping 1  2  Tired, decreased energy 1  2  Change in appetite  1  1  Feeling bad or failure about yourself  2  3  Trouble concentrating 1  2  Moving slowly or fidgety/restless 1  0  Suicidal thoughts 1  1  PHQ-9 Score 12  16  Difficult doing work/chores Very difficult  Extremely dIfficult    Scribe for Treatment Team: Elza Rafter, LCSWA 09/29/2023 10:03 AM

## 2023-09-29 NOTE — Plan of Care (Signed)
  Problem: Education: Goal: Emotional status will improve Outcome: Progressing Goal: Mental status will improve Outcome: Progressing Goal: Verbalization of understanding the information provided will improve Outcome: Progressing   Problem: Activity: Goal: Interest or engagement in activities will improve Outcome: Not Progressing   Problem: Safety: Goal: Periods of time without injury will increase Outcome: Progressing

## 2023-09-29 NOTE — [facility] Counselor (Signed)
CSW attempted PSA with patient. Pt. declined indicating she is having significant withdrawal symptoms. Add'l attempts will continue.

## 2023-09-29 NOTE — Group Note (Signed)
Date:  09/29/2023 Time:  9:15 AM  Group Topic/Focus:  Goals Group:   The focus of this group is to help patients establish daily goals to achieve during treatment and discuss how the patient can incorporate goal setting into their daily lives to aide in recovery.   Participation Level:  Did Not Attend   Luci Bellucci A Sarthak Rubenstein 09/29/2023, 7:14 PM

## 2023-09-29 NOTE — Plan of Care (Signed)
  Problem: Self-Concept: Goal: Will verbalize positive feelings about self Outcome: Progressing   Problem: Role Relationship: Goal: Will demonstrate positive changes in social behaviors and relationships Outcome: Progressing
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
## 2023-09-30 MED ORDER — CLONAZEPAM 1 MG PO TABS
1.0000 mg | ORAL_TABLET | Freq: Two times a day (BID) | ORAL | Status: DC
  Administered 2023-10-01 – 2023-10-02 (×3): 1 mg via ORAL
  Filled 2023-09-30 (×5): qty 1

## 2023-09-30 NOTE — Group Note (Signed)
Date:  09/30/2023 Time:  10:15 AM  Group Topic/Focus:  Healthy Communication:   The focus of this group is to discuss communication, barriers to communication, as well as healthy ways to communicate with others.    Participation Level:  Did Not Attend   Keo Schirmer 09/30/2023, 10:15 AM

## 2023-09-30 NOTE — Group Note (Signed)
Date:  09/30/2023 Time:  9:57 PM  Group Topic/Focus:  Self Esteem Action Plan:   The focus of this group is to help patients create a plan to continue to build self-esteem after discharge.    Participation Level:  Did Not Attend  Participation Quality:   none  Affect:   none  Cognitive:   nione  Insight: None  Engagement in Group:   none  Modes of Intervention:   none  Additional Comments:     Saloni Lablanc 09/30/2023, 9:57 PM

## 2023-09-30 NOTE — Group Note (Signed)
Recreation Therapy Group Note   Group Topic:Goal Setting  Group Date: 09/30/2023 Start Time: 1000 End Time: 1100 Facilitators: Rosina Lowenstein, LRT, CTRS Location:  Craft Room  Group Description: Scientist, research (physical sciences). Patients were given many different magazines, a glue stick, markers, and a piece of cardstock paper. LRT and pts discussed the importance of having goals in life. LRT and pts discussed the difference between short-term and long-term goals, as well as what a SMART goal is. LRT encouraged pts to create a vision board, with images they picked and then cut out with safety scissors from the magazine, for themselves, that capture their short and long-term goals. LRT encouraged pts to show and explain their vision board to the group.   Goal Area(s) Addressed:  Patient will gain knowledge of short vs. long term goals.  Patient will identify goals for themselves. Patient will practice setting SMART goals. Patient will verbalize their goals to LRT and peers.   Affect/Mood: N/A   Participation Level: Did not attend    Clinical Observations/Individualized Feedback: Taylor Wiggins did not attend group.  Plan: Continue to engage patient in RT group sessions 2-3x/week.   Rosina Lowenstein, LRT, CTRS 09/30/2023 11:31 AM

## 2023-09-30 NOTE — [facility] Counselor (Signed)
Pt reports that she is unsure if she will pursue outpatient or inpatient treatment.   Penni Homans, MSW, LCSW 09/30/2023 1:12 PM
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
## 2023-09-30 NOTE — Plan of Care (Signed)
  Problem: Education: Goal: Mental status will improve Outcome: Progressing   Problem: Pain Managment: Goal: General experience of comfort will improve Outcome: Progressing

## 2023-09-30 NOTE — Plan of Care (Signed)
Patient stayed in bed most of the shift. Patient stated that she feels better with her withdrawals.Patient does not verbalize any discharge plans. Denies SI,HI and AVH. Compliant with medications. Appetite and energy level fair. Support and encouragement given.

## 2023-09-30 NOTE — [facility] Suicide Risk Assessment (Signed)
[facility] INPATIENT:  Family/Significant Other Suicide Prevention Education  Suicide Prevention Education:  Patient Refusal for Family/Significant Other Suicide Prevention Education: The patient Taylor Wiggins has refused to provide written consent for family/significant other to be provided Family/Significant Other Suicide Prevention Education during admission and/or prior to discharge.  Physician notified. SPE completed with pt, as pt refused to consent to family contact. SPI pamphlet provided to pt and pt was encouraged to share information with support network, ask questions, and talk about any concerns relating to SPE. Pt denies access to guns/firearms and verbalized understanding of information provided. Mobile Crisis information also provided to pt.    Harden Mo 09/30/2023, 1:12 PM
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
## 2023-09-30 NOTE — Group Note (Signed)
Date:  09/30/2023 Time:  4:43 PM  Group Topic/Focus:  Making Healthy Choices:   The focus of this group is to help patients identify negative/unhealthy choices they were using prior to admission and identify positive/healthier coping strategies to replace them upon discharge.    Participation Level:  Did Not Attend  Rosaura Carpenter 09/30/2023, 4:43 PM

## 2023-10-01 NOTE — Group Note (Signed)
Date:  10/01/2023 Time:  2:17 PM  Group Topic/Focus:  Outdoor recreation structured activity    Participation Level:  Did Not Attend   Taylor Wiggins 10/01/2023, 2:17 PM

## 2023-10-01 NOTE — Progress Notes (Signed)
Patient calm and pleasant during assessment denying SI/HI/AVH. Pt endorses anxiety. Pt refused her night time medication. Pt given education, still refused. Pt isolative to her room tonight. Pt given education, support, and encouragement to be active in her treatment plan. Pt being monitored Q 15 minutes for safety per unit protocol, remains safe on the unit

## 2023-10-01 NOTE — Group Note (Signed)
LCSW Group Therapy Note  Group Date: 10/01/2023 Start Time: 1300 End Time: 1415   Type of Therapy and Topic:  Group Therapy: Self-Harm Alternatives  Participation Level:  Did Not Attend   Description of Group:   Patients participated in a discussion regarding non-suicidal self-injurious behavior (NSSIB, or self-harm) and the stigma surrounding it. There was also discussion surrounding how other maladaptive coping skills could be seen as self-harm, such as substance abuse. Participants were invited to share their experiences with self-harm, with emphasis being placed on the motivation for self-harm (such as release, punishment, feeling numb, etc). Patients were then asked to brainstorm potential substitutions for self-harm and were provided with a handout entitled, "Distraction Techniques and Alternative Coping Strategies," published by [employer] Self-Injury Recovery.  Therapeutic Goals:  Patients will be given the opportunity to discuss NSSIB in a non-judgmental and therapeutic environment. Patients will identify which feelings lead to NSSIB.  Patients will discuss potential healthy coping skills to replace NSSIB Open discussion will specifically address stigma and shame surrounding NSSIB.   Summary of Patient Progress:   X  Therapeutic Modalities:   Cognitive Behavioral Therapy   Harden Mo, LCSWA 10/01/2023  2:26 PM

## 2023-10-01 NOTE — Progress Notes (Signed)
Patient asked to take her scheduled morning medication at lunch time. Providers will be notified during progression rounds.

## 2023-10-01 NOTE — Group Note (Signed)
Date:  10/01/2023 Time:  2:07 PM  Group Topic/Focus:  Recovery Goals:   The focus of this group is to identify appropriate goals for recovery and establish a plan to achieve them.    Participation Level:  Did Not Attend   Taylor Wiggins 10/01/2023, 2:07 PM

## 2023-10-01 NOTE — Plan of Care (Signed)
Problem: Education: Goal: Knowledge of [facility] Education information/materials will improve Outcome: Not Progressing Goal: Emotional status will improve Outcome: Not Progressing Goal: Mental status will improve Outcome: Not Progressing Goal: Verbalization of understanding the information provided will improve Outcome: Not Progressing   Problem: Activity: Goal: Interest or engagement in activities will improve Outcome: Not Progressing Goal: Sleeping patterns will improve Outcome: Not Progressing   Problem: Coping: Goal: Ability to verbalize frustrations and anger appropriately will improve Outcome: Not Progressing Goal: Ability to demonstrate self-control will improve Outcome: Not Progressing   Problem: Health Behavior/Discharge Planning: Goal: Identification of resources available to assist in meeting health care needs will improve Outcome: Not Progressing Goal: Compliance with treatment plan for underlying cause of condition will improve Outcome: Not Progressing   Problem: Physical Regulation: Goal: Ability to maintain clinical measurements within normal limits will improve Outcome: Not Progressing   Problem: Safety: Goal: Periods of time without injury will increase Outcome: Not Progressing   Problem: Education: Goal: Knowledge of General Education information will improve Description: Including pain rating scale, medication(s)/side effects and non-pharmacologic comfort measures Outcome: Not Progressing   Problem: Health Behavior/Discharge Planning: Goal: Ability to manage health-related needs will improve Outcome: Not Progressing   Problem: Clinical Measurements: Goal: Ability to maintain clinical measurements within normal limits will improve Outcome: Not Progressing Goal: Will remain free from infection Outcome: Not Progressing Goal: Diagnostic test results will improve Outcome: Not Progressing Goal: Respiratory complications will  improve Outcome: Not Progressing Goal: Cardiovascular complication will be avoided Outcome: Not Progressing   Problem: Activity: Goal: Risk for activity intolerance will decrease Outcome: Not Progressing   Problem: Nutrition: Goal: Adequate nutrition will be maintained Outcome: Not Progressing   Problem: Coping: Goal: Level of anxiety will decrease Outcome: Not Progressing   Problem: Elimination: Goal: Will not experience complications related to bowel motility Outcome: Not Progressing Goal: Will not experience complications related to urinary retention Outcome: Not Progressing   Problem: Pain Managment: Goal: General experience of comfort will improve Outcome: Not Progressing   Problem: Safety: Goal: Ability to remain free from injury will improve Outcome: Not Progressing   Problem: Skin Integrity: Goal: Risk for impaired skin integrity will decrease Outcome: Not Progressing   Problem: Education: Goal: Ability to make informed decisions regarding treatment will improve Outcome: Not Progressing   Problem: Coping: Goal: Coping ability will improve Outcome: Not Progressing   Problem: Health Behavior/Discharge Planning: Goal: Identification of resources available to assist in meeting health care needs will improve Outcome: Not Progressing   Problem: Medication: Goal: Compliance with prescribed medication regimen will improve Outcome: Not Progressing   Problem: Self-Concept: Goal: Ability to disclose and discuss suicidal ideas will improve Outcome: Not Progressing   Problem: Education: Goal: Utilization of techniques to improve thought processes will improve Outcome: Not Progressing Goal: Knowledge of the prescribed therapeutic regimen will improve Outcome: Not Progressing   Problem: Activity: Goal: Interest or engagement in leisure activities will improve Outcome: Not Progressing Goal: Imbalance in normal sleep/wake cycle will improve Outcome: Not  Progressing   Problem: Coping: Goal: Coping ability will improve Outcome: Not Progressing Goal: Will verbalize feelings Outcome: Not Progressing   Problem: Health Behavior/Discharge Planning: Goal: Ability to make decisions will improve Outcome: Not Progressing Goal: Compliance with therapeutic regimen will improve Outcome: Not Progressing   Problem: Role Relationship: Goal: Will demonstrate positive changes in social behaviors and relationships Outcome: Not Progressing   Problem: Safety: Goal: Ability to disclose and discuss suicidal ideas will improve Outcome: Not Progressing Goal:  Ability to identify and utilize support systems that promote safety will improve Outcome: Not Progressing   Problem: Self-Concept: Goal: Will verbalize positive feelings about self Outcome: Not Progressing Goal: Level of anxiety will decrease Outcome: Not Progressing

## 2023-10-01 NOTE — Group Note (Signed)
Date:  10/01/2023 Time:  7:08 PM  Group Topic/Focus:  OUTDOOR RECREATION STRUCTURED GROUP ACTIVITY    Participation Level:  Did Not Attend   Taylor Wiggins 10/01/2023, 7:08 PM

## 2023-10-01 NOTE — Progress Notes (Signed)
   10/01/23 0800  Clinical Opiate Withdrawal Scale (COWS)  Resting Pulse Rate 0  Sweating 2  Restlessness 0  Pupil Size 0  Bone or Joint Aches 0  Runny Nose or Tearing 0  GI Upset 0  Tremor 0  Yawning 0  Anxiety or Irritability 0  Gooseflesh Skin 0  COWS Total Score 2

## 2023-10-01 NOTE — Progress Notes (Signed)
D- Patient alert and oriented. Patient presented in a pleasant mood on assessment stating that she slept ok last night and had no complaints to voice to this Clinical research associate. Patient denied depression, but endorsed anxiety, rating it a "5/10". Patient stated that she doesn't know why she's anxious, but "I'm alright". Patient also denies SI, HI, AVH, and pain at this time. Patient was in bed most of the morning, however, she did sit up in the dayroom and watch TV for a little while after dinner. Patient had no stated goals for today.  A- Scheduled medications administered to patient, per MD orders. Support and encouragement provided. Routine safety checks conducted every 15 minutes. Patient informed to notify staff with problems or concerns.  R- No adverse drug reactions noted. Patient contracts for safety at this time. Patient compliant with medications. Patient receptive, calm, and cooperative. Patient interacts well with others on the unit. Patient remains safe at this time.   10/01/23 1500  Psych Admission Type (Psych Patients Only)  Admission Status Voluntary  Psychosocial Assessment  Patient Complaints Anxiety  Eye Contact Fair  Facial Expression Sullen  Affect Sullen  Speech Logical/coherent  Interaction Isolative (patient has been in bed, except for lunch and is asking about dinner.)  Motor Activity Slow  Appearance/Hygiene Disheveled;Poor hygiene;In scrubs  Behavior Characteristics Appropriate to situation  Mood Pleasant  Aggressive Behavior  Effect No apparent injury  Thought Process  Coherency Circumstantial  Content WDL  Delusions None reported or observed  Perception WDL  Hallucination None reported or observed  Judgment WDL  Confusion None  Danger to Self  Current suicidal ideation? Denies  Self-Injurious Behavior No self-injurious ideation or behavior indicators observed or expressed   Agreement Not to Harm Self Yes  Description of Agreement Verbal  Danger to Others  Danger  to Others None reported or observed

## 2023-10-01 NOTE — Progress Notes (Signed)
   10/01/23 1600  Clinical Opiate Withdrawal Scale (COWS)  Resting Pulse Rate 0  Sweating 0  Restlessness 0  Pupil Size 0  Bone or Joint Aches 0  Runny Nose or Tearing 0  GI Upset 0  Tremor 0  Yawning 0  Anxiety or Irritability 0  Gooseflesh Skin 0  COWS Total Score 0

## 2023-10-01 NOTE — Group Note (Signed)
Date:  10/01/2023 Time:  11:22 PM  Group Topic/Focus:  Goals Group:   The focus of this group is to help patients establish daily goals to achieve during treatment and discuss how the patient can incorporate goal setting into their daily lives to aide in recovery.    Participation Level:  Active  Participation Quality:  Appropriate and Attentive  Affect:  Appropriate  Cognitive:  Alert and Appropriate  Insight: Appropriate  Engagement in Group:  Improving  Modes of Intervention:  Education, Exploration, Problem-solving, and Reality Testing  Additional Comments:     Tanda Morrissey 10/01/2023, 11:22 PM

## 2023-10-01 NOTE — Progress Notes (Signed)
[facility] MD Progress Note  10/01/2023 13:57 Taylor Wiggins  MRN:  [id number] Subjective:  Patient: 42 year old Caucasian Wiggins.No complaints at this time, currently withdrawing from opiates and other substatances.Presenting Complaint: The patient is currently withdrawing from opiates and other substances. She denies experiencing any nausea or cramps associated with withdrawal symptoms. The patient also denies suicidal ideation, homicidal ideation, delusions, hallucinations, or self-injurious behavior.The patient is being managed on the COWS protocol for opioid withdrawal.  Principal Problem: MDD (major depressive disorder), recurrent severe, without psychosis (HCC) Diagnosis: Principal Problem:   MDD (major depressive disorder), recurrent severe, without psychosis (HCC)  Total Time spent with patient: 45 minutes  Past Psychiatric History: ADHD Anxiety Polysubstance Abuse  Past Medical History:  Past Medical History:  Diagnosis Date   Adult ADHD    Anxiety    Asthma    Hepatitis C    IBS (irritable bowel syndrome)    Opioid use disorder    No longer on buprenorphine therapy   PTSD (post-traumatic stress disorder)     Past Surgical History:  Procedure Laterality Date   BREAST REDUCTION SURGERY  2005   Family History:  Family History  Problem Relation Age of Onset   Anxiety disorder Mother    Diabetes Mother    Anxiety disorder Father    Family Psychiatric  History: see above Social History:  Social History   Substance and Sexual Activity  Alcohol Use No     Social History   Substance and Sexual Activity  Drug Use Not Currently   Types: Methamphetamines   Comment: no methamphetamine use in 30 days    Social History   Socioeconomic History   Marital status: Legally Separated    Spouse name: Not on file   Number of children: Not on file   Years of education: Not on file   Highest education level: Not on file  Occupational History   Not on file  Tobacco Use    Smoking status: Every Day    Current packs/day: 0.00    Types: Cigarettes    Last attempt to quit: 09/06/2014    Years since quitting: 9.0   Smokeless tobacco: Never  Vaping Use   Vaping status: Every Day  Substance and Sexual Activity   Alcohol use: No   Drug use: Not Currently    Types: Methamphetamines    Comment: no methamphetamine use in 30 days   Sexual activity: Not Currently    Birth control/protection: Abstinence  Other Topics Concern   Not on file  Social History Narrative   Taylor Wiggins. Married and homeless with polysubstance abuse. Per pt-Graduated from [employer] in 2006 with bachelors degree in Business Administration.      04/24/23- Patient reported separated from husband but not legally at this time. Pt states she is going through several hardships this year and is currently taking care of mother and using address at this time. Requests information on resources for when she is no longer with mother.    Social Determinants of Health   Financial Resource Strain: Not on file  Food Insecurity: Food Insecurity Present (09/28/2023)   Hunger Vital Sign    Worried About Running Out of Food in the Last Year: Sometimes true    Ran Out of Food in the Last Year: Sometimes true  Transportation Needs: Patient Declined (09/28/2023)   PRAPARE - Administrator, Civil Service (Medical): Patient declined    Lack of Transportation (Non-Medical): Patient declined  Physical Activity:  Not on file  Stress: Not on file  Social Connections: Not on file   Additional Social History:                         Sleep: Good  Appetite:  Good  Current Medications: Current Facility-Administered Medications  Medication Dose Route Frequency Provider Last Rate Last Admin   albuterol (VENTOLIN HFA) 108 (90 Base) MCG/ACT inhaler 2 puff  2 puff Inhalation Q4H PRN Sarina Ill, DO       clonazePAM Scarlette Calico) tablet 1 mg  1 mg Oral BID Myriam Forehand, NP    1 mg at 10/01/23 1135   dicyclomine (BENTYL) tablet 20 mg  20 mg Oral Q6H PRN Lenard Lance, FNP       diphenhydrAMINE (BENADRYL) capsule 50 mg  50 mg Oral TID PRN Lenard Lance, FNP       Or   diphenhydrAMINE (BENADRYL) injection 50 mg  50 mg Intramuscular TID PRN Lenard Lance, FNP       haloperidol (HALDOL) tablet 5 mg  5 mg Oral TID PRN Lenard Lance, FNP       Or   haloperidol lactate (HALDOL) injection 5 mg  5 mg Intramuscular TID PRN Lenard Lance, FNP       hydrOXYzine (ATARAX) tablet 25 mg  25 mg Oral Q6H PRN Lenard Lance, FNP       loperamide (IMODIUM) capsule 2-4 mg  2-4 mg Oral PRN Lenard Lance, FNP       LORazepam (ATIVAN) tablet 2 mg  2 mg Oral TID PRN Lenard Lance, FNP       Or   LORazepam (ATIVAN) injection 2 mg  2 mg Intramuscular TID PRN Lenard Lance, FNP       methocarbamol (ROBAXIN) tablet 500 mg  500 mg Oral Q8H PRN Lenard Lance, FNP       naproxen (NAPROSYN) tablet 500 mg  500 mg Oral BID PRN Lenard Lance, FNP   500 mg at 09/30/23 0640   ondansetron (ZOFRAN-ODT) disintegrating tablet 4 mg  4 mg Oral Q6H PRN Lenard Lance, FNP       QUEtiapine (SEROQUEL) tablet 200 mg  200 mg Oral QHS Sarina Ill, DO   200 mg at 09/29/23 2125   sertraline (ZOLOFT) tablet 50 mg  50 mg Oral Daily Sarina Ill, DO   50 mg at 10/01/23 1134   traZODone (DESYREL) tablet 100 mg  100 mg Oral QHS PRN Sarina Ill, DO        Lab Results: No results found for this or any previous visit (from the past 48 hour(s)).  Blood Alcohol level:  Lab Results  Component Value Date   ETH <10 09/28/2023   ETH <10 06/20/2023    Metabolic Disorder Labs: Lab Results  Component Value Date   HGBA1C 5.8 (H) 09/28/2023   MPG 119.76 09/28/2023   MPG 114 06/25/2023   No results found for: "PROLACTIN" Lab Results  Component Value Date   CHOL 194 09/28/2023   TRIG 52 09/28/2023   HDL Taylor 09/28/2023   CHOLHDL 3.0 09/28/2023   VLDL 10 09/28/2023   LDLCALC 119 (H)  09/28/2023   LDLCALC 122 (H) 06/25/2023    Physical Findings: AIMS:  , ,  ,  ,    CIWA:    COWS:  COWS Total Score: 0  Musculoskeletal: Strength & Muscle Tone: within normal  limits Gait & Station: normal Patient leans: N/A  Psychiatric Specialty Exam:  Presentation  General Appearance:  Appropriate for Environment  Eye Contact: Good  Speech: Clear and Coherent  Speech Volume: Normal  Handedness: Right   Mood and Affect  Mood: Anxious (about discharge)  Affect: Appropriate   Thought Process  Thought Processes: Coherent  Descriptions of Associations:Intact  Orientation:Full (Time, Place and Person) (and situation)  Thought Content:WDL  History of Schizophrenia/Schizoaffective disorder:No  Duration of Psychotic Symptoms:N/A  Hallucinations:Hallucinations: None  Ideas of Reference:None  Suicidal Thoughts:Suicidal Thoughts: No SI Active Intent and/or Plan: -- (none noted)  Homicidal Thoughts:Homicidal Thoughts: No   Sensorium  Memory: Immediate Fair  Judgment: Poor  Insight: Fair   Art therapist  Concentration: Good  Attention Span: Fair  Recall: Good  Fund of Knowledge: Good  Language: Good   Psychomotor Activity  Psychomotor Activity: Psychomotor Activity: Normal   Assets  Assets: Communication Skills   Sleep  Sleep: Sleep: Good Number of Hours of Sleep: 8    Physical Exam: Physical Exam Vitals and nursing note reviewed.  HENT:     Head: Normocephalic and atraumatic.     Nose: Nose normal.  Pulmonary:     Effort: Pulmonary effort is normal.  Musculoskeletal:        General: Normal range of motion.     Cervical back: Normal range of motion.  Neurological:     General: No focal deficit present.     Mental Status: She is alert and oriented to person, place, and time.    Review of Systems  Psychiatric/Behavioral:  Positive for substance abuse. The patient is nervous/anxious.         Polysubstance  All other systems reviewed and are negative.  Blood pressure 110/75, pulse 77, temperature 97.7 F (36.5 C), resp. rate 17, height 5\' 7"  (1.702 m), weight Taylor.3 kg, SpO2 97%. Body mass index is 22.55 kg/m.   Treatment Plan Summary: 1.No medication adjustments at this time, pending phenobarbital levels. 2.Group Therapy:Encourage the patient to attend group therapy sessions to support her recovery and coping strategies. 3.Discharge Planning:Continue working with the LCSW to develop a discharge plan and explore appropriate placement options for the patient. 4.Continue monitoring for any physical withdrawal symptoms or changes in the patient's mental state as the COWS protocol progresses.  Myriam Forehand, NP 10/01/2023, 5:57 PM

## 2023-10-01 NOTE — Progress Notes (Signed)
Nursing Shift Note:  1900-0700  Attended Evening Group: No Medication Compliant:  No - refused all Behavior: Isolative Sleep Quality: good Significant Changes: none noted.   10/01/23 0400  Psych Admission Type (Psych Patients Only)  Admission Status Voluntary  Psychosocial Assessment  Patient Complaints None  Eye Contact Fair  Facial Expression Flat;Sad  Affect Depressed  Speech Logical/coherent  Interaction Assertive  Motor Activity Slow  Appearance/Hygiene Disheveled  Behavior Characteristics Appropriate to situation  Mood Depressed  Thought Process  Coherency WDL  Content WDL  Delusions None reported or observed  Perception WDL  Hallucination None reported or observed  Judgment WDL  Confusion None  Danger to Self  Current suicidal ideation? Denies  Danger to Others  Danger to Others None reported or observed

## 2023-10-02 LAB — PHENOBARBITAL LEVEL: Phenobarbital: <5 ug/mL — ABNORMAL LOW (ref 15.0–40.0)

## 2023-10-02 MED ORDER — SERTRALINE HCL 100 MG PO TABS
100.0000 mg | ORAL_TABLET | Freq: Every day | ORAL | Status: DC
  Administered 2023-10-03: 100 mg via ORAL
  Filled 2023-10-02: qty 1

## 2023-10-02 MED ORDER — CLONAZEPAM 0.5 MG PO TABS
0.5000 mg | ORAL_TABLET | Freq: Two times a day (BID) | ORAL | Status: DC
  Administered 2023-10-02 – 2023-10-03 (×2): 0.5 mg via ORAL
  Filled 2023-10-02 (×2): qty 1

## 2023-10-02 NOTE — Plan of Care (Signed)
  Problem: Education: Goal: Knowledge of [facility] Education information/materials will improve Outcome: Progressing Goal: Emotional status will improve Outcome: Progressing Goal: Mental status will improve Outcome: Progressing Goal: Verbalization of understanding the information provided will improve Outcome: Progressing   Problem: Activity: Goal: Interest or engagement in activities will improve Outcome: Progressing Goal: Sleeping patterns will improve Outcome: Progressing   Problem: Coping: Goal: Ability to verbalize frustrations and anger appropriately will improve Outcome: Progressing Goal: Ability to demonstrate self-control will improve Outcome: Progressing   Problem: Health Behavior/Discharge Planning: Goal: Identification of resources available to assist in meeting health care needs will improve Outcome: Progressing Goal: Compliance with treatment plan for underlying cause of condition will improve Outcome: Progressing   Problem: Physical Regulation: Goal: Ability to maintain clinical measurements within normal limits will improve Outcome: Progressing   Problem: Safety: Goal: Periods of time without injury will increase Outcome: Progressing   Problem: Education: Goal: Knowledge of General Education information will improve Description: Including pain rating scale, medication(s)/side effects and non-pharmacologic comfort measures Outcome: Progressing   Problem: Health Behavior/Discharge Planning: Goal: Ability to manage health-related needs will improve Outcome: Progressing   Problem: Clinical Measurements: Goal: Ability to maintain clinical measurements within normal limits will improve Outcome: Progressing Goal: Will remain free from infection Outcome: Progressing Goal: Diagnostic test results will improve Outcome: Progressing Goal: Respiratory complications will improve Outcome: Progressing Goal: Cardiovascular complication will be  avoided Outcome: Progressing   Problem: Activity: Goal: Risk for activity intolerance will decrease Outcome: Progressing   Problem: Nutrition: Goal: Adequate nutrition will be maintained Outcome: Progressing   Problem: Coping: Goal: Level of anxiety will decrease Outcome: Progressing   Problem: Elimination: Goal: Will not experience complications related to bowel motility Outcome: Progressing Goal: Will not experience complications related to urinary retention Outcome: Progressing   Problem: Pain Managment: Goal: General experience of comfort will improve Outcome: Progressing   Problem: Safety: Goal: Ability to remain free from injury will improve Outcome: Progressing   Problem: Skin Integrity: Goal: Risk for impaired skin integrity will decrease Outcome: Progressing   Problem: Education: Goal: Ability to make informed decisions regarding treatment will improve Outcome: Progressing   Problem: Coping: Goal: Coping ability will improve Outcome: Progressing   Problem: Health Behavior/Discharge Planning: Goal: Identification of resources available to assist in meeting health care needs will improve Outcome: Progressing   Problem: Medication: Goal: Compliance with prescribed medication regimen will improve Outcome: Progressing   Problem: Self-Concept: Goal: Ability to disclose and discuss suicidal ideas will improve Outcome: Progressing   Problem: Education: Goal: Utilization of techniques to improve thought processes will improve Outcome: Progressing Goal: Knowledge of the prescribed therapeutic regimen will improve Outcome: Progressing   Problem: Activity: Goal: Interest or engagement in leisure activities will improve Outcome: Progressing Goal: Imbalance in normal sleep/wake cycle will improve Outcome: Progressing   Problem: Coping: Goal: Coping ability will improve Outcome: Progressing Goal: Will verbalize feelings Outcome: Progressing   Problem:  Health Behavior/Discharge Planning: Goal: Ability to make decisions will improve Outcome: Progressing Goal: Compliance with therapeutic regimen will improve Outcome: Progressing   Problem: Role Relationship: Goal: Will demonstrate positive changes in social behaviors and relationships Outcome: Progressing   Problem: Safety: Goal: Ability to disclose and discuss suicidal ideas will improve Outcome: Progressing Goal: Ability to identify and utilize support systems that promote safety will improve Outcome: Progressing   Problem: Self-Concept: Goal: Will verbalize positive feelings about self Outcome: Progressing Goal: Level of anxiety will decrease Outcome: Progressing

## 2023-10-02 NOTE — Plan of Care (Signed)
Pt denies SI/HI/AVH, refused night time medication. This Clinical research associate gave education, she still refused.   Problem: Education: Goal: Knowledge of [facility] Education information/materials will improve Outcome: Not Progressing Goal: Emotional status will improve Outcome: Not Progressing Goal: Mental status will improve Outcome: Not Progressing Goal: Verbalization of understanding the information provided will improve Outcome: Not Progressing   Problem: Activity: Goal: Interest or engagement in activities will improve Outcome: Not Progressing Goal: Sleeping patterns will improve Outcome: Not Progressing   Problem: Coping: Goal: Ability to verbalize frustrations and anger appropriately will improve Outcome: Not Progressing Goal: Ability to demonstrate self-control will improve Outcome: Not Progressing   Problem: Health Behavior/Discharge Planning: Goal: Identification of resources available to assist in meeting health care needs will improve Outcome: Not Progressing Goal: Compliance with treatment plan for underlying cause of condition will improve Outcome: Not Progressing   Problem: Physical Regulation: Goal: Ability to maintain clinical measurements within normal limits will improve Outcome: Not Progressing   Problem: Safety: Goal: Periods of time without injury will increase Outcome: Not Progressing   Problem: Education: Goal: Knowledge of General Education information will improve Description: Including pain rating scale, medication(s)/side effects and non-pharmacologic comfort measures Outcome: Not Progressing   Problem: Health Behavior/Discharge Planning: Goal: Ability to manage health-related needs will improve Outcome: Not Progressing   Problem: Clinical Measurements: Goal: Ability to maintain clinical measurements within normal limits will improve Outcome: Not Progressing Goal: Will remain free from infection Outcome: Not Progressing Goal: Diagnostic test  results will improve Outcome: Not Progressing Goal: Respiratory complications will improve Outcome: Not Progressing Goal: Cardiovascular complication will be avoided Outcome: Not Progressing   Problem: Activity: Goal: Risk for activity intolerance will decrease Outcome: Not Progressing   Problem: Nutrition: Goal: Adequate nutrition will be maintained Outcome: Not Progressing   Problem: Coping: Goal: Level of anxiety will decrease Outcome: Not Progressing   Problem: Elimination: Goal: Will not experience complications related to bowel motility Outcome: Not Progressing Goal: Will not experience complications related to urinary retention Outcome: Not Progressing   Problem: Pain Managment: Goal: General experience of comfort will improve Outcome: Not Progressing   Problem: Safety: Goal: Ability to remain free from injury will improve Outcome: Not Progressing   Problem: Skin Integrity: Goal: Risk for impaired skin integrity will decrease Outcome: Not Progressing   Problem: Education: Goal: Ability to make informed decisions regarding treatment will improve Outcome: Not Progressing   Problem: Coping: Goal: Coping ability will improve Outcome: Not Progressing   Problem: Health Behavior/Discharge Planning: Goal: Identification of resources available to assist in meeting health care needs will improve Outcome: Not Progressing   Problem: Medication: Goal: Compliance with prescribed medication regimen will improve Outcome: Not Progressing   Problem: Self-Concept: Goal: Ability to disclose and discuss suicidal ideas will improve Outcome: Not Progressing   Problem: Education: Goal: Utilization of techniques to improve thought processes will improve Outcome: Not Progressing Goal: Knowledge of the prescribed therapeutic regimen will improve Outcome: Not Progressing   Problem: Activity: Goal: Interest or engagement in leisure activities will improve Outcome: Not  Progressing Goal: Imbalance in normal sleep/wake cycle will improve Outcome: Not Progressing   Problem: Coping: Goal: Coping ability will improve Outcome: Not Progressing Goal: Will verbalize feelings Outcome: Not Progressing   Problem: Health Behavior/Discharge Planning: Goal: Ability to make decisions will improve Outcome: Not Progressing Goal: Compliance with therapeutic regimen will improve Outcome: Not Progressing   Problem: Role Relationship: Goal: Will demonstrate positive changes in social behaviors and relationships Outcome: Not Progressing  Problem: Safety: Goal: Ability to disclose and discuss suicidal ideas will improve Outcome: Not Progressing Goal: Ability to identify and utilize support systems that promote safety will improve Outcome: Not Progressing   Problem: Self-Concept: Goal: Will verbalize positive feelings about self Outcome: Not Progressing Goal: Level of anxiety will decrease Outcome: Not Progressing

## 2023-10-02 NOTE — Progress Notes (Signed)
Patient is in bed, stating that she is getting ready to take a shower to help her feel better. She has no complaints of any withdrawal symptoms at this time.   10/02/23 0800  Clinical Opiate Withdrawal Scale (COWS)  Resting Pulse Rate 0  Sweating 2  Restlessness 0  Pupil Size 0  Bone or Joint Aches 0  Runny Nose or Tearing 0  GI Upset 0  Tremor 0  Yawning 0  Anxiety or Irritability 0  Gooseflesh Skin 0  COWS Total Score 2

## 2023-10-02 NOTE — Progress Notes (Signed)
Patient came up asking for her scheduled medication,in which she initially asked to have at noon, but has taken it without any issues.

## 2023-10-02 NOTE — Group Note (Signed)
Recreation Therapy Group Note   Group Topic:Communication  Group Date: 10/02/2023 Start Time: 1000 End Time: 1100 Facilitators: Rosina Lowenstein, LRT, CTRS Location: Courtyard  Group Description: Emotional Check in. Patient sat and talked with LRT about how they are doing and whatever else is on their mind. LRT provided active listening, reassurance and encouragement. Pts were given the opportunity to listen to music or play cornhole while getting fresh air and sunlight in the courtyard.    Goal Area(s) Addressed: Patient will engage in conversation with LRT. Patient will communicate their wants, needs, or questions.  Patient will practice a new coping skill of "talking to someone".   Affect/Mood: N/A   Participation Level: Did not attend    Clinical Observations/Individualized Feedback: Taylor Wiggins did not attend group.  Plan: Continue to engage patient in RT group sessions 2-3x/week.   Rosina Lowenstein, LRT, CTRS 10/02/2023 11:25 AM

## 2023-10-02 NOTE — Progress Notes (Signed)
Patient was found in the hallway, waiting for dinner, when this writer approached her and asked if she was experiencing any signs/symptoms of withdrawal symptoms. Patient stated that "I'm feeling much better".   10/02/23 1600  Clinical Opiate Withdrawal Scale (COWS)  Resting Pulse Rate 0  Sweating 0  Restlessness 0  Pupil Size 0  Bone or Joint Aches 0  Runny Nose or Tearing 0  GI Upset 0  Tremor 0  Yawning 0  Anxiety or Irritability 0  Gooseflesh Skin 0  COWS Total Score 0

## 2023-10-02 NOTE — Group Note (Signed)
Date:  10/02/2023 Time:  8:30 PM  Group Topic/Focus:  Stages of Change:   The focus of this group is to explain the stages of change and help patients identify changes they want to make upon discharge.    Participation Level:  Did Not Attend  Participation Quality:   none  Affect:   none  Cognitive:   none  Insight: None  Engagement in Group:   none  Modes of Intervention:   none  Additional Comments:  none  Mansour Balboa 10/02/2023, 8:30 PM
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
## 2023-10-02 NOTE — Group Note (Signed)
Date:  10/02/2023 Time:  2:18 PM  Group Topic/Focus:  Goals Group:   The focus of this group is to help patients establish daily goals to achieve during treatment and discuss how the patient can incorporate goal setting into their daily lives to aide in recovery.    Participation Level:  Active  Participation Quality:  Appropriate  Affect:  Appropriate  Cognitive:  Alert  Insight: Good  Engagement in Group:  Engaged  Modes of Intervention:  Education  Additional Comments:    Ardelle Anton 10/02/2023, 2:18 PM

## 2023-10-02 NOTE — Progress Notes (Signed)
Patient requested to take her scheduled 0800 medication at noon. This Clinical research associate moved medication time to noon and will notify the providers during progression rounds.

## 2023-10-02 NOTE — Progress Notes (Signed)
D- Patient alert and oriented. Patient presented in a pleasant mood on assessment reporting that she slept good last night and had no complaints to voice to this Clinical research associate. Although she endorsed both depression and anxiety, she stated that it's "getting better". Patient denied SI, HI, AVH, and pain at this time. Patient had no stated goals for today.  A- Scheduled medications administered to patient, per MD orders. Support and encouragement provided. Routine safety checks conducted every 15 minutes. Patient informed to notify staff with problems or concerns.  R- No adverse drug reactions noted. Patient contracts for safety at this time. Patient compliant with medications. Patient receptive, calm, and cooperative. Patient isolates to room, except for meals and medication. Patient remains safe at this time.   10/02/23 1000  Psych Admission Type (Psych Patients Only)  Admission Status Voluntary  Psychosocial Assessment  Patient Complaints Anxiety;Depression  Eye Contact Fair  Facial Expression Sullen  Affect Appropriate to circumstance  Speech Logical/coherent  Interaction Assertive;Isolative (isolates to room, except for meals and medication)  Motor Activity Slow  Appearance/Hygiene In scrubs;Disheveled  Behavior Characteristics Cooperative;Appropriate to situation  Mood Pleasant  Aggressive Behavior  Effect No apparent injury  Thought Process  Coherency Circumstantial  Content WDL  Delusions None reported or observed  Perception WDL  Hallucination None reported or observed  Judgment WDL  Confusion None  Danger to Self  Current suicidal ideation? Denies  Self-Injurious Behavior No self-injurious ideation or behavior indicators observed or expressed   Agreement Not to Harm Self Yes  Description of Agreement Verbal  Danger to Others  Danger to Others None reported or observed

## 2023-10-03 MED ORDER — SERTRALINE HCL 100 MG PO TABS: 100.0000 mg | ORAL_TABLET | Freq: Every day | ORAL | 0 refills | Status: DC

## 2023-10-03 MED ORDER — QUETIAPINE FUMARATE 200 MG PO TABS: 200.0000 mg | ORAL_TABLET | Freq: Every day | ORAL | 0 refills | Status: DC

## 2023-10-03 MED ORDER — CLONAZEPAM 0.5 MG PO TABS: 0.5000 mg | ORAL_TABLET | Freq: Every day | ORAL | 0 refills | Status: DC

## 2023-10-03 MED ORDER — ALBUTEROL SULFATE HFA 108 (90 BASE) MCG/ACT IN AERS: 2.0000 | INHALATION_SPRAY | RESPIRATORY_TRACT | 0 refills | Status: DC | PRN

## 2023-10-03 NOTE — Group Note (Signed)
Recreation Therapy Group Note   Group Topic:Leisure Education  Group Date: 10/03/2023 Start Time: 1000 End Time: 1100 Facilitators: Rosina Lowenstein, LRT, CTRS Location:  Craft Room  Group Description: Leisure. Patients were given the option to choose from singing karaoke, coloring mandalas, using oil pastels, journaling, or playing with play-doh. LRT and pts discussed the meaning of leisure, the importance of participating in leisure during their free time/when they're outside of the hospital, as well as how our leisure interests can also serve as coping skills.   Goal Area(s) Addressed:  Patient will identify a current leisure interest.  Patient will learn the definition of "leisure". Patient will practice making a positive decision. Patient will have the opportunity to try a new leisure activity. Patient will communicate with peers and LRT.    Affect/Mood: N/A   Participation Level: Did not attend    Clinical Observations/Individualized Feedback: Taylor Wiggins did not attend group.  Plan: Continue to engage patient in RT group sessions 2-3x/week.   Rosina Lowenstein, LRT, CTRS 10/03/2023 11:12 AM

## 2023-10-03 NOTE — Progress Notes (Signed)
   10/03/23 1100  Psych Admission Type (Psych Patients Only)  Admission Status Voluntary  Psychosocial Assessment  Patient Complaints Anxiety  Eye Contact Fair  Facial Expression Worried  Affect Preoccupied  Speech Logical/coherent  Interaction Assertive  Motor Activity Slow  Appearance/Hygiene In scrubs  Behavior Characteristics Appropriate to situation  Mood Pleasant  Thought Process  Coherency Circumstantial  Content WDL  Delusions None reported or observed  Perception WDL  Hallucination None reported or observed  Judgment WDL  Confusion None  Danger to Self  Current suicidal ideation? Denies  Agreement Not to Harm Self Yes  Danger to Others  Danger to Others None reported or observed   Patient discharged in care of family. All discharge instruction with prescription read and given to patient with acknowledgement.

## 2023-10-03 NOTE — BHH Counselor (Signed)
CSW spoke with patient's husband.   Husband reports no concerns for the patient returning home.  He reports that he will provide with her with transportation home.   He reports that the patient is not a danger to self or others and does not have access to weapons.  Penni Homans, MSW, LCSW 10/03/2023 10:35 AM

## 2023-10-03 NOTE — Progress Notes (Signed)
  [facility] Adult Case Management Discharge Plan :  Will you be returning to the same living situation after discharge:  Yes,  pt reports that she is returning home with her husband.  At discharge, do you have transportation home?: Yes,  pt reports hat her husband is providing transportation.  Do you have the ability to pay for your medications: Yes,  [employer] / [employer] [facility]HP  Release of information consent forms completed and in the chart;  Patient's signature needed at discharge.  Patient to Follow up at:  Follow-up Information     [facility] Follow up.   Why: Walk in hours are from Monday, Wednesday and Friday 8AM to [employer] information: [address] [place] [address] [phone]         [facility] Follow up.   Specialty: Professional Counselor Why: Walk in hours are from 9AM to [employer] information: [employer] of the [place] [address] [place] [address] [phone]                 Next level of care provider has access to [facility] Link:no  Safety Planning and Suicide Prevention discussed: Yes,  SPE completed with the patent's .      Has patient been referred to the Quitline?: Patient refused referral for treatment  Patient has been referred for addiction treatment: Patient refused referral for treatment; referral information given to patient at discharge.  Harden Mo, LCSW 10/03/2023, 10:23 AM
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
## 2023-10-03 NOTE — Progress Notes (Signed)
Patient cooperative with treatment, no w/d symptoms noted on shift thus far, she took medication with encouragement. She remains depressed but she seemed to sleep well through out the night. No new issues to report on shift at this time.

## 2023-10-03 NOTE — [facility] Suicide Risk Assessment (Signed)
[facility] INPATIENT:  Family/Significant Other Suicide Prevention Education  Suicide Prevention Education:  Education Completed; Carnesha Maravilla, husband, [phone],  has been identified by the patient as the family member/significant other with whom the patient will be residing, and identified as the person(s) who will aid the patient in the event of a mental health crisis (suicidal ideations/suicide attempt).  With written consent from the patient, the family member/significant other has been provided the following suicide prevention education, prior to the and/or following the discharge of the patient.  The suicide prevention education provided includes the following: Suicide risk factors Suicide prevention and interventions National Suicide Hotline telephone number [facility] assessment telephone number [facility] Emergency Assistance 911 [facility] and/or Residential Mobile Crisis Unit telephone number  Request made of family/significant other to: Remove weapons (e.g., guns, rifles, knives), all items previously/currently identified as safety concern.   Remove drugs/medications (over-the-counter, prescriptions, illicit drugs), all items previously/currently identified as a safety concern.  The family member/significant other verbalizes understanding of the suicide prevention education information provided.  The family member/significant other agrees to remove the items of safety concern listed above.      Harden Mo 10/03/2023, 10:31 AM
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
## 2023-10-06 NOTE — Group Note (Signed)
[facility] LCSW Group Therapy Note   Group Date: 10/02/2023 Start Time: 1315 End Time: 1420   Type of Therapy/Topic:  Group Therapy:  Balance in Life  Participation Level:  Did Not Attend   Description of Group:    This group will address the concept of balance and how it feels and looks when one is unbalanced. Patients will be encouraged to process areas in their lives that are out of balance, and identify reasons for remaining unbalanced. Facilitators will guide patients utilizing problem- solving interventions to address and correct the stressor making their life unbalanced. Understanding and applying boundaries will be explored and addressed for obtaining  and maintaining a balanced life. Patients will be encouraged to explore ways to assertively make their unbalanced needs known to significant others in their lives, using other group members and facilitator for support and feedback.  Therapeutic Goals: Patient will identify two or more emotions or situations they have that consume much of in their lives. Patient will identify signs/triggers that life has become out of balance:  Patient will identify two ways to set boundaries in order to achieve balance in their lives:  Patient will demonstrate ability to communicate their needs through discussion and/or role plays  Summary of Patient Progress: X   Therapeutic Modalities:   Cognitive Behavioral Therapy Solution-Focused Therapy Assertiveness Training   Glenis Smoker, LCSW

## 2024-01-25 ENCOUNTER — Emergency Department
Admission: EM | Admit: 2024-01-25 | Discharge: 2024-01-25 | Discharge status: Against Medical Advice | Payer: Medicaid Other | Attending: Behavioral Health | Admitting: Behavioral Health

## 2024-01-25 DIAGNOSIS — Z5321 Procedure and treatment not carried out due to patient leaving prior to being seen by health care provider: Secondary | ICD-10-CM | POA: Diagnosis not present

## 2024-01-25 DIAGNOSIS — R079 Chest pain, unspecified: Secondary | ICD-10-CM | POA: Insufficient documentation

## 2024-01-25 NOTE — ED Triage Notes (Signed)
First Nurse Note:  [place] [facility] from [facility]. Pt was walking started having mid-CP, non-radiating. Denies cardiac hx. EMS reports EKG unremarkable. EMS gave 324 ASA. Pt has gotten better since she isn't walking. Pt is A&Ox4 and NAD 87 HR  100% on RA 119/78 BP
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
## 2024-02-20 ENCOUNTER — Other Ambulatory Visit: Payer: Medicaid Other

## 2024-02-23 ENCOUNTER — Ambulatory Visit: Payer: Medicaid Other

## 2024-02-23 ENCOUNTER — Other Ambulatory Visit: Payer: Medicaid Other

## 2024-03-01 ENCOUNTER — Other Ambulatory Visit: Payer: Medicaid Other

## 2024-03-04 ENCOUNTER — Ambulatory Visit: Payer: Medicaid Other

## 2024-04-30 ENCOUNTER — Telehealth: Payer: Self-pay

## 2024-04-30 NOTE — Telephone Encounter (Signed)
[place] with the transfer?

## 2024-04-30 NOTE — Telephone Encounter (Signed)
 Copied from CRM [id number]. Topic: Appointments - Transfer of Care >> Apr 30, 2024  1:45 PM Traseandia E wrote: Pt is requesting to transfer FROM: Taylor Wiggins Pt is requesting to transfer TO: Deborra Falter Reason for requested transfer: Relocation It is the responsibility of the team the patient would like to transfer to (Dr.Kaur) to reach out to the patient if for any reason this transfer is not acceptable.

## 2024-06-21 ENCOUNTER — Ambulatory Visit: Admitting: General Practice

## 2024-06-21 DIAGNOSIS — Z7689 Persons encountering health services in other specified circumstances: Secondary | ICD-10-CM

## 2024-07-05 ENCOUNTER — Encounter: Payer: Self-pay | Admitting: Emergency Medicine

## 2024-07-05 ENCOUNTER — Emergency Department

## 2024-07-05 ENCOUNTER — Emergency Department: Admission: EM | Admit: 2024-07-05 | Discharge: 2024-07-05 | Discharge status: Against Medical Advice

## 2024-07-05 ENCOUNTER — Emergency Department
Admission: EM | Admit: 2024-07-05 | Discharge: 2024-07-05 | Discharge status: Against Medical Advice | Source: Home / Self Care

## 2024-07-05 ENCOUNTER — Emergency Department
Admission: EM | Admit: 2024-07-05 | Discharge: 2024-07-09 | Disposition: A | Discharge status: Home / Self Care | Attending: Emergency Medicine | Admitting: Emergency Medicine

## 2024-07-05 DIAGNOSIS — R45851 Suicidal ideations: Secondary | ICD-10-CM

## 2024-07-05 DIAGNOSIS — F10920 Alcohol use, unspecified with intoxication, uncomplicated: Secondary | ICD-10-CM | POA: Insufficient documentation

## 2024-07-05 DIAGNOSIS — F329 Major depressive disorder, single episode, unspecified: Secondary | ICD-10-CM | POA: Insufficient documentation

## 2024-07-05 DIAGNOSIS — M79642 Pain in left hand: Secondary | ICD-10-CM | POA: Diagnosis present

## 2024-07-05 DIAGNOSIS — S62321A Displaced fracture of shaft of second metacarpal bone, left hand, initial encounter for closed fracture: Secondary | ICD-10-CM

## 2024-07-05 DIAGNOSIS — F10129 Alcohol abuse with intoxication, unspecified: Secondary | ICD-10-CM | POA: Diagnosis not present

## 2024-07-05 LAB — BASIC METABOLIC PANEL WITH GFR
Anion gap: 10 (ref 5–15)
BUN: 10 mg/dL (ref 6–20)
CO2: 25 mmol/L (ref 22–32)
Calcium: 8.9 mg/dL (ref 8.9–10.3)
Chloride: 106 mmol/L (ref 98–111)
Creatinine, Ser: 0.99 mg/dL (ref 0.44–1.00)
GFR, Estimated: >60 mL/min (ref >60)
Glucose, Bld: 114 mg/dL — ABNORMAL HIGH (ref 70–99)
Potassium: 4 mmol/L (ref 3.5–5.1)
Sodium: 141 mmol/L (ref 135–145)

## 2024-07-05 LAB — CBC WITH DIFFERENTIAL/PLATELET
Abs Immature Granulocytes: 0.01 K/uL (ref 0.00–0.07)
Basophils Absolute: 0.1 K/uL (ref 0.0–0.1)
Basophils Relative: 1 %
Eosinophils Absolute: 0.1 K/uL (ref 0.0–0.5)
Eosinophils Relative: 2 %
HCT: 42.9 % (ref 36.0–46.0)
Hemoglobin: 14.5 g/dL (ref 12.0–15.0)
Immature Granulocytes: 0 %
Lymphocytes Relative: 34 %
Lymphs Abs: 2.6 K/uL (ref 0.7–4.0)
MCH: 31.2 pg (ref 26.0–34.0)
MCHC: 33.8 g/dL (ref 30.0–36.0)
MCV: 92.3 fL (ref 80.0–100.0)
Monocytes Absolute: 0.4 K/uL (ref 0.1–1.0)
Monocytes Relative: 5 %
Neutro Abs: 4.4 K/uL (ref 1.7–7.7)
Neutrophils Relative %: 58 %
Platelets: 465 K/uL — ABNORMAL HIGH (ref 150–400)
RBC: 4.65 MIL/uL (ref 3.87–5.11)
RDW: 13.5 % (ref 11.5–15.5)
WBC: 7.6 K/uL (ref 4.0–10.5)
nRBC: 0 % (ref 0.0–0.2)

## 2024-07-05 LAB — URINE DRUG SCREEN, QUALITATIVE (ARMC ONLY)
Amphetamines, Ur Screen: NOT DETECTED
Barbiturates, Ur Screen: NOT DETECTED
Benzodiazepine, Ur Scrn: POSITIVE — AB
Cannabinoid 50 Ng, Ur ~~LOC~~: POSITIVE — AB
Cocaine Metabolite,Ur ~~LOC~~: NOT DETECTED
MDMA (Ecstasy)Ur Screen: NOT DETECTED
Methadone Scn, Ur: NOT DETECTED
Opiate, Ur Screen: NOT DETECTED
Phencyclidine (PCP) Ur S: NOT DETECTED
Tricyclic, Ur Screen: NOT DETECTED

## 2024-07-05 LAB — ETHANOL: Alcohol, Ethyl (B): 298 mg/dL — ABNORMAL HIGH (ref <15)

## 2024-07-05 LAB — POC URINE PREG, ED: Preg Test, Ur: NEGATIVE

## 2024-07-05 LAB — ACETAMINOPHEN LEVEL: Acetaminophen (Tylenol), Serum: <10 ug/mL — ABNORMAL LOW (ref 10–30)

## 2024-07-05 LAB — SALICYLATE LEVEL: Salicylate Lvl: <7 mg/dL — ABNORMAL LOW (ref 7.0–30.0)

## 2024-07-05 NOTE — ED Notes (Signed)
 Pt called her mother to come get here. Pt states that she will come back when the wait is not so long.
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
## 2024-07-05 NOTE — ED Notes (Signed)
 Pt Belongings: Tan pants Pair white shoes Green shirt White shirt Black purse Black hair tie

## 2024-07-05 NOTE — ED Notes (Signed)
 Pt going to xray with security escort.

## 2024-07-06 ENCOUNTER — Inpatient Hospital Stay: Admission: AD | Admit: 2024-07-06 | Source: Intra-hospital | Admitting: Psychiatry

## 2024-07-06 DIAGNOSIS — F332 Major depressive disorder, recurrent severe without psychotic features: Secondary | ICD-10-CM | POA: Diagnosis not present

## 2024-07-06 MED ORDER — QUETIAPINE FUMARATE 200 MG PO TABS
200.0000 mg | ORAL_TABLET | Freq: Every day | ORAL | Status: DC
  Administered 2024-07-06 – 2024-07-08 (×4): 200 mg via ORAL
  Filled 2024-07-06 (×4): qty 1

## 2024-07-06 MED ORDER — CLONAZEPAM 0.5 MG PO TABS
0.5000 mg | ORAL_TABLET | Freq: Once | ORAL | Status: AC
  Administered 2024-07-06: 0.5 mg via ORAL
  Filled 2024-07-06: qty 1

## 2024-07-06 MED ORDER — IBUPROFEN 600 MG PO TABS
600.0000 mg | ORAL_TABLET | Freq: Once | ORAL | Status: AC
  Administered 2024-07-06: 600 mg via ORAL
  Filled 2024-07-06: qty 1

## 2024-07-06 NOTE — ED Notes (Signed)
 Pt to interview room with security for psych consult.
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
## 2024-07-06 NOTE — BH Assessment (Signed)
 This writer attempted to wake patient up for her assessment but patient was unable to be aroused, Clinical research associate made several attempts to call her name and tap the patient but was unsuccessful. Psyc team to follow

## 2024-07-06 NOTE — ED Provider Notes (Signed)
-----------------------------------------   10:59 AM on 07/06/2024 -----------------------------------------  Consulted in person with Dr. Allyn Foil with psychiatry.  She is going to work on getting additional collateral information from family and then reassess the patient.   Gordan Huxley, MD 07/06/24 1059

## 2024-07-06 NOTE — ED Notes (Signed)
 IVC/  PENDING  PLACEMENT

## 2024-07-06 NOTE — ED Notes (Signed)
 ivc by MD Wells/psych consult ordered/pending.
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
## 2024-07-06 NOTE — Progress Notes (Addendum)
 Update:  Per AC Danika, patient's bed has been rescinded due to brace requiring ace wrap.      Patient has been accepted to [facility] BMU Patient assigned to room 304 Accepting physician is Dr. Jadapalle. Call report to [phone]. Representative was [facility] Danika.   ER Staff is aware of it: [facility] ER Secretary Dr. Gordan, ER MD Zelda, RN Patient's Nurse   Michial Skeen, [facility] [phone]

## 2024-07-07 MED ORDER — HYDROCODONE-ACETAMINOPHEN 5-325 MG PO TABS
1.0000 | ORAL_TABLET | Freq: Once | ORAL | Status: AC
  Administered 2024-07-07: 1 via ORAL
  Filled 2024-07-07: qty 1

## 2024-07-07 NOTE — ED Notes (Signed)

## 2024-07-07 NOTE — ED Notes (Signed)
 Pt given snack and drink at this time. Pt is well appearing. Pt denies no other needs at this time.

## 2024-07-07 NOTE — ED Notes (Signed)
 IVC pending placement

## 2024-07-07 NOTE — ED Notes (Signed)
 IVC/  PENDING  PLACEMENT

## 2024-07-07 NOTE — ED Notes (Signed)
 Snacks provided to pt

## 2024-07-07 NOTE — ED Provider Notes (Signed)
 Emergency Medicine Observation Re-evaluation Note  Taylor Wiggins is a 43 y.o. female, seen on rounds today.  Pt initially presented to the ED for complaints of Alcohol  Intoxication  Currently, the patient is resting in bed. No reported issues from nursing team.   Physical Exam  BP 110/78 (BP Location: Right Arm)   Pulse 78   Temp 98.8 F (37.1 C) (Oral)   Resp 16   Ht 5' 7 (1.702 m)   Wt 65.3 kg   SpO2 97%   BMI 22.55 kg/m  Physical Exam General: Resting in bed  ED Course / MDM   No labs last 24 hours.  Plan  Current plan is for dispo per psychiatry.    Levander Slate, MD 07/07/24 [phone]

## 2024-07-07 NOTE — ED Notes (Signed)
 Pt was provided supper tray.

## 2024-07-07 NOTE — ED Notes (Signed)
 RN taking over care of pt after receiving report from [place], [place]. Pt ABCs intact. RR even and unlabored. Pt in NAD. Denies needs at this time.   Past Medical History:  Diagnosis Date   Adult ADHD    Anxiety    Asthma    Hepatitis C    IBS (irritable bowel syndrome)    Opioid use disorder    No longer on buprenorphine therapy   PTSD (post-traumatic stress disorder)

## 2024-07-08 MED ORDER — HYDROCODONE-ACETAMINOPHEN 5-325 MG PO TABS
1.0000 | ORAL_TABLET | Freq: Once | ORAL | Status: AC
  Administered 2024-07-08: 1 via ORAL
  Filled 2024-07-08: qty 1

## 2024-07-08 MED ORDER — CLONAZEPAM 1 MG PO TABS
1.0000 mg | ORAL_TABLET | Freq: Once | ORAL | Status: AC
  Administered 2024-07-08: 1 mg via ORAL
  Filled 2024-07-08: qty 1

## 2024-07-08 MED ORDER — IBUPROFEN 600 MG PO TABS
600.0000 mg | ORAL_TABLET | Freq: Four times a day (QID) | ORAL | Status: DC | PRN
  Administered 2024-07-08 – 2024-07-09 (×2): 600 mg via ORAL
  Filled 2024-07-08 (×3): qty 1

## 2024-07-08 MED ORDER — ACETAMINOPHEN 500 MG PO TABS
1000.0000 mg | ORAL_TABLET | Freq: Four times a day (QID) | ORAL | Status: DC | PRN
  Administered 2024-07-08 (×2): 1000 mg via ORAL
  Filled 2024-07-08 (×3): qty 2

## 2024-07-08 MED ORDER — OXYCODONE HCL 5 MG PO TABS
5.0000 mg | ORAL_TABLET | Freq: Once | ORAL | Status: AC
  Administered 2024-07-08: 5 mg via ORAL
  Filled 2024-07-08: qty 1

## 2024-07-08 NOTE — ED Notes (Signed)
 Pt took a shower

## 2024-07-08 NOTE — ED Notes (Signed)
 Pt given snack and drink at this time. Pt is well appearing. Pt denies no other needs at this time.

## 2024-07-08 NOTE — ED Notes (Signed)
 Snacks provided to pt

## 2024-07-08 NOTE — BH Assessment (Signed)
   Referral information faxed to:  Service Provider Phone  [facility]  [phone]  [facility]  [phone]  [facility]  [phone]  [facility]  [phone]  [facility]  [phone]  [facility]  [phone]  [facility]  [phone]  [place] [facility]  [phone]  [facility]  [phone]  [facility]  [phone]  [facility]  [phone]  [facility]  [phone]  Internal Comment last updated by Nora Michial GAILS 07/06/2024 1625           [facility]  [phone]  [facility]  [phone]  [facility]  [phone]  [facility]  [phone]

## 2024-07-08 NOTE — ED Notes (Signed)
 Assumed care of pt. Report received from previous RN. Pt alert and oriented. Pt RR even and unlabored. Pt denies pain at this time. Pt calm and cooperative. Pt denies any needs at this time.

## 2024-07-08 NOTE — ED Provider Notes (Signed)
 Emergency Medicine Observation Re-evaluation Note  Taylor Wiggins is a 43 y.o. female, seen on rounds today.  Pt initially presented to the ED for complaints of Alcohol  Intoxication  Currently, the patient is is no acute distress. Denies any concerns at this time.  Physical Exam  Blood pressure 125/82, pulse 85, temperature 98 F (36.7 C), resp. rate 18, height 5' 7 (1.702 m), weight 65.3 kg, SpO2 96%.  Physical Exam: General: No apparent distress Pulm: Normal WOB Neuro: Moving all extremities Psych: Resting comfortably     ED Course / MDM     I have reviewed the labs performed to date as well as medications administered while in observation.  Recent changes in the last 24 hours include: No acute events overnight.  Plan   Current plan: Psychiatry recommended inpatient placement.  Currently waiting for placement. Patient is under full IVC at this time.    Suzanne Kirsch, MD 07/08/24 [phone]

## 2024-07-08 NOTE — ED Notes (Addendum)
 RN taking over care of pt at this time after receiving report from [place], [place]. RN informed that during today's shift, pts mother called for update on pt. Pts mother did not sound stable and it was advised that the pt have another place to go home to, when appropriate for discharge. Otherwise, pt had a great day today. Pt ABCs intact. RR even and unlabored. Pt in NAD. Denies needs at this time.   Past Medical History:  Diagnosis Date   Adult ADHD    Anxiety    Asthma    Hepatitis C    IBS (irritable bowel syndrome)    Opioid use disorder    No longer on buprenorphine therapy   PTSD (post-traumatic stress disorder)

## 2024-07-08 NOTE — ED Notes (Signed)
 IVC pending placement

## 2024-07-08 NOTE — ED Notes (Signed)
 IVC/Pending Placement

## 2024-07-08 NOTE — ED Notes (Signed)
 Meal tray provided. Tray checked for potential hazards. Pt denies no other needs at this time.

## 2024-07-08 NOTE — ED Notes (Signed)
 Shower supplies,hospital scrubs and grip socks provided to pt.

## 2024-07-08 NOTE — ED Notes (Signed)
 Pts mother called RN and asked to speak with pt. RN informed pts mother that phone times are over but she can call back in the morning starting at 9am to speak with pt.

## 2024-07-08 NOTE — ED Notes (Signed)
 Rounded on pt at this time. Pt is well appearing. RR even and unlabored. Pt is calm and cooperative. Pt denies no other needs at this time.

## 2024-07-08 NOTE — ED Notes (Signed)
 Lunch tray provided to pt.

## 2024-07-09 DIAGNOSIS — F10129 Alcohol abuse with intoxication, unspecified: Secondary | ICD-10-CM

## 2024-07-09 DIAGNOSIS — R45851 Suicidal ideations: Secondary | ICD-10-CM

## 2024-07-09 NOTE — ED Provider Notes (Signed)
 Notified by Dr. Jadapalle that she evaluated this patient this morning and did feel that she could be cleared from psychiatric perspective.  Patient was given outpatient resources from TTS.  Her IVC was rescinded by psychiatry team.  Patient has otherwise been medically cleared.  She is due for orthopedics follow-up in the next week, will include this information patient's discharge paperwork.  Patient discharged in stable condition with strict return precautions.   Levander Slate, MD 07/09/24 1351

## 2024-07-09 NOTE — ED Notes (Signed)
 Splint reinforced, discharge instructions reviewed.  Pt to be transported by [facility].

## 2024-07-09 NOTE — ED Notes (Signed)
 Pt refused a shower, says she still feels clean.

## 2024-07-09 NOTE — ED Notes (Signed)
Lunch tray provided with water.

## 2024-07-09 NOTE — ED Notes (Signed)
 Per Dr. Jadapalle, patient has been psych cleared for discharge. [facility] provided [facility] and RTSA resources to [place], Charity fundraiser for patient.  [place], [facility] [phone]

## 2024-07-09 NOTE — Discharge Instructions (Signed)
 Follow-up with orthopedics within the next week for further evaluation of your fracture.  Use the resources provided for help with your substance use.  Return to the ER for new or worsening symptoms.

## 2024-07-09 NOTE — ED Notes (Signed)
 IVC rescinded/pt now VOL

## 2024-07-09 NOTE — ED Notes (Signed)
 [facility] called for transport to residence , spoke with Merlynn

## 2024-07-09 NOTE — ED Notes (Signed)
Snacks given to pt.
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
## 2024-07-09 NOTE — ED Provider Notes (Signed)
 Emergency Medicine Observation Re-evaluation Note  Taylor Wiggins is a 43 y.o. female, seen on rounds today.  Pt initially presented to the ED for complaints of Alcohol  Intoxication  Currently, the patient is resting in bed. No reported issues from nursing team.   Physical Exam  BP 112/87 (BP Location: Right Arm)   Pulse 73   Temp 98.7 F (37.1 C) (Oral)   Resp 17   Ht 5' 7 (1.702 m)   Wt 65.3 kg   SpO2 97%   BMI 22.55 kg/m  Physical Exam General: Resting in bed  ED Course / MDM   No labs last 24 hours.  Plan  Current plan is for dispo per psychiatry.    Levander Slate, MD 07/09/24 [id number]

## 2024-07-16 ENCOUNTER — Other Ambulatory Visit

## 2024-07-16 ENCOUNTER — Ambulatory Visit

## 2024-07-19 ENCOUNTER — Other Ambulatory Visit

## 2024-07-23 ENCOUNTER — Ambulatory Visit (LOCAL_COMMUNITY_HEALTH_CENTER)

## 2024-07-23 ENCOUNTER — Ambulatory Visit: Admitting: Family Medicine

## 2024-07-23 ENCOUNTER — Encounter: Payer: Self-pay | Admitting: Family Medicine

## 2024-07-23 DIAGNOSIS — Z1231 Encounter for screening mammogram for malignant neoplasm of breast: Secondary | ICD-10-CM

## 2024-07-23 DIAGNOSIS — Z3202 Encounter for pregnancy test, result negative: Secondary | ICD-10-CM | POA: Diagnosis not present

## 2024-07-23 DIAGNOSIS — Z309 Encounter for contraceptive management, unspecified: Secondary | ICD-10-CM | POA: Diagnosis not present

## 2024-07-23 DIAGNOSIS — Z8619 Personal history of other infectious and parasitic diseases: Secondary | ICD-10-CM

## 2024-07-23 DIAGNOSIS — Z114 Encounter for screening for human immunodeficiency virus [HIV]: Secondary | ICD-10-CM

## 2024-07-23 DIAGNOSIS — Z3200 Encounter for pregnancy test, result unknown: Secondary | ICD-10-CM

## 2024-07-23 DIAGNOSIS — Z111 Encounter for screening for respiratory tuberculosis: Secondary | ICD-10-CM

## 2024-07-23 LAB — HM HIV SCREENING LAB: HM HIV Screening: NEGATIVE

## 2024-07-23 LAB — PREGNANCY, URINE: Preg Test, Ur: NEGATIVE

## 2024-07-23 NOTE — Progress Notes (Signed)
 [facility] Problem Visit  [facility]  Subjective:  Taylor Wiggins is a 43 y.o. being seen today for a pregnancy test, HIV test. Also completed a TB skin test in nurse clinic.   HPI: She is entering new rehab facility and this is required testing. Denies desire for syphilis testing, gonorrhea/chlamydia testing. Has diagnosis of hepatitis C without having received any treatment. She also has not had a pap test since around 2015 per patient. Discussed need for cervical cancer screening. Patient reports she is not prepared for this today but will schedule. Also declined breast exam today, but will complete this when she comes back for her pap.  Does the patient have a current or past history of drug use? Yes   No components found for: HCV]  Health Maintenance Due  Topic Date Due   DTaP/Tdap/Td (1 - Tdap) Never done   Pneumococcal Vaccine 75-50 Years old (1 of 2 - PCV) Never done   Hepatitis B Vaccines (1 of 3 - 19+ 3-dose series) Never done   HPV VACCINES (1 - 3-dose SCDM series) Never done   Cervical Cancer Screening (HPV/Pap Cotest)  07/22/2014   Review of Systems  All other systems reviewed and are negative.  The following portions of the patient's history were reviewed and updated as appropriate: allergies, current medications, past family history, past medical history, past social history, past surgical history and problem list. Problem list updated.  See flowsheet for other program required questions.  Objective:  There were no vitals filed for this visit.  Physical Exam Constitutional:      Appearance: Normal appearance.  HENT:     Head: Normocephalic.     Mouth/Throat:     Mouth: Mucous membranes are moist.  Eyes:     General: No scleral icterus.       Right eye: No discharge.        Left eye: No discharge.  Pulmonary:     Effort: Pulmonary effort is normal.  Skin:    General: Skin is warm and dry.  Neurological:      General: No focal deficit present.     Mental Status: She is alert.  Psychiatric:        Mood and Affect: Mood normal.        Behavior: Behavior normal.    Taylor Wiggins is a 43 y.o. female presenting to the [facility] Department for a Women's Health problem visit  1. Encounter for pregnancy test, result unknown (Primary)  - Pregnancy, urine  2. Screening for HIV (human immunodeficiency virus)  - HIV [facility]  3. History of hepatitis C  - Hepatitis C positive, no treatment received  - Ambulatory referral to Infectious Disease  4. Screening mammogram for breast cancer  - CBE declined today but willing at return visit for pap - MM Digital Screening; Future  Return for as soon as able for cervical cancer screening.  Future Appointments  Date Time Provider Department Center  07/26/2024 10:00 AM [facility] NURSE [facility] None    Damien FORBES [facility]chel [facility]

## 2024-07-25 ENCOUNTER — Ambulatory Visit: Payer: Self-pay | Admitting: Family Medicine

## 2024-07-25 NOTE — Progress Notes (Signed)
 Reviewed.   Dorothyann Helling, MD 07/25/24  8:25 PM

## 2024-07-26 ENCOUNTER — Ambulatory Visit (LOCAL_COMMUNITY_HEALTH_CENTER)

## 2024-07-26 DIAGNOSIS — Z111 Encounter for screening for respiratory tuberculosis: Secondary | ICD-10-CM

## 2024-07-26 LAB — TB SKIN TEST
Induration: 0 mm
TB Skin Test: NEGATIVE

## 2024-07-28 ENCOUNTER — Telehealth: Payer: Self-pay | Admitting: Family Medicine

## 2024-08-03 ENCOUNTER — Encounter: Payer: Self-pay | Admitting: Family Medicine

## 2024-09-09 ENCOUNTER — Ambulatory Visit: Admitting: Infectious Diseases

## 2024-10-12 ENCOUNTER — Ambulatory Visit: Admitting: Infectious Diseases

## 2024-11-23 ENCOUNTER — Encounter: Payer: Self-pay | Admitting: Infectious Diseases

## 2024-11-23 ENCOUNTER — Ambulatory Visit: Payer: MEDICAID | Attending: Infectious Diseases | Admitting: Infectious Diseases

## 2024-11-23 ENCOUNTER — Ambulatory Visit (INDEPENDENT_AMBULATORY_CARE_PROVIDER_SITE_OTHER)

## 2024-11-23 ENCOUNTER — Other Ambulatory Visit
Admission: RE | Admit: 2024-11-23 | Discharge: 2024-11-23 | Disposition: A | Discharge status: Home / Self Care | Source: Ambulatory Visit | Attending: Infectious Diseases | Admitting: Infectious Diseases

## 2024-11-23 VITALS — BP 100/72 | HR 90 | Temp 97.2°F | Ht 67.0 in | Wt 155.0 lb

## 2024-11-23 DIAGNOSIS — F1191 Opioid use, unspecified, in remission: Secondary | ICD-10-CM | POA: Insufficient documentation

## 2024-11-23 DIAGNOSIS — J45909 Unspecified asthma, uncomplicated: Secondary | ICD-10-CM | POA: Insufficient documentation

## 2024-11-23 DIAGNOSIS — Z1283 Encounter for screening for malignant neoplasm of skin: Secondary | ICD-10-CM | POA: Diagnosis not present

## 2024-11-23 DIAGNOSIS — L578 Other skin changes due to chronic exposure to nonionizing radiation: Secondary | ICD-10-CM | POA: Diagnosis not present

## 2024-11-23 DIAGNOSIS — Z634 Disappearance and death of family member: Secondary | ICD-10-CM | POA: Insufficient documentation

## 2024-11-23 DIAGNOSIS — F4310 Post-traumatic stress disorder, unspecified: Secondary | ICD-10-CM | POA: Insufficient documentation

## 2024-11-23 DIAGNOSIS — F988 Other specified behavioral and emotional disorders with onset usually occurring in childhood and adolescence: Secondary | ICD-10-CM | POA: Diagnosis not present

## 2024-11-23 DIAGNOSIS — F17210 Nicotine dependence, cigarettes, uncomplicated: Secondary | ICD-10-CM | POA: Diagnosis not present

## 2024-11-23 DIAGNOSIS — B182 Chronic viral hepatitis C: Secondary | ICD-10-CM | POA: Insufficient documentation

## 2024-11-23 DIAGNOSIS — L821 Other seborrheic keratosis: Secondary | ICD-10-CM | POA: Diagnosis not present

## 2024-11-23 DIAGNOSIS — W908XXA Exposure to other nonionizing radiation, initial encounter: Secondary | ICD-10-CM

## 2024-11-23 DIAGNOSIS — R7989 Other specified abnormal findings of blood chemistry: Secondary | ICD-10-CM | POA: Insufficient documentation

## 2024-11-23 DIAGNOSIS — Z8619 Personal history of other infectious and parasitic diseases: Secondary | ICD-10-CM | POA: Diagnosis present

## 2024-11-23 DIAGNOSIS — L720 Epidermal cyst: Secondary | ICD-10-CM

## 2024-11-23 DIAGNOSIS — D229 Melanocytic nevi, unspecified: Secondary | ICD-10-CM

## 2024-11-23 DIAGNOSIS — D1801 Hemangioma of skin and subcutaneous tissue: Secondary | ICD-10-CM

## 2024-11-23 DIAGNOSIS — L988 Other specified disorders of the skin and subcutaneous tissue: Secondary | ICD-10-CM

## 2024-11-23 DIAGNOSIS — Z808 Family history of malignant neoplasm of other organs or systems: Secondary | ICD-10-CM

## 2024-11-23 DIAGNOSIS — R7689 Other specified abnormal immunological findings in serum: Secondary | ICD-10-CM | POA: Diagnosis not present

## 2024-11-23 DIAGNOSIS — L814 Other melanin hyperpigmentation: Secondary | ICD-10-CM

## 2024-11-23 LAB — HEPATIC FUNCTION PANEL
ALT: 210 U/L — ABNORMAL HIGH (ref 0–44)
AST: 115 U/L — ABNORMAL HIGH (ref 15–41)
Albumin: 4.5 g/dL (ref 3.5–5.0)
Alkaline Phosphatase: 55 U/L (ref 38–126)
Bilirubin, Direct: 0.2 mg/dL (ref 0.0–0.2)
Indirect Bilirubin: 0.3 mg/dL (ref 0.3–0.9)
Total Bilirubin: 0.5 mg/dL (ref 0.0–1.2)
Total Protein: 7.7 g/dL (ref 6.5–8.1)

## 2024-11-23 LAB — HEPATITIS A ANTIBODY, TOTAL: hep A Total Ab: NONREACTIVE

## 2024-11-23 NOTE — Patient Instructions (Signed)
 You came in today to evaluate your hepatitis C status. You were diagnosed with hepatitis C about four to five years ago, and you have not received treatment due to personal circumstances. You have a history of elevated liver enzymes and have experienced symptoms like headaches, nausea, and liver pain. You have been abstinent from drug use since January of last year and have not consumed alcohol  since experiencing significant liver pain. Your past medical history includes asthma, PTSD, and ADD. You have been screened for HIV with nonreactive results and have not been vaccinated for hepatitis A or B.  YOUR PLAN:  -HEPATITIS C ANTIBODY POSITIVE, ACTIVE INFECTION STATUS UNKNOWN: You have tested positive for hepatitis C antibodies, but it is unclear if you have an active infection. Elevated liver enzymes suggest a possible active infection. We discussed that some people naturally eliminate the virus, while others may develop liver damage. We have ordered a hepatitis C RNA viral load test and a genotype test to determine if you have an active infection. Additionally, a liver ultrasound has been ordered to assess your liver condition. If an active infection is confirmed, we will start treatment with Epclusa (sofosbuvir/velpatasvir), which is a 12-week course with high cure rates. We will also coordinate with the pharmacy for medication approval and delivery. Follow-up blood work will be scheduled after 2 months of treatment to check your viral load.  INSTRUCTIONS:  Please complete the hepatitis C RNA viral load test, genotype test, and liver ultrasound as soon as possible. If an active infection is confirmed, we will start you on Epclusa (sofosbuvir/velpatasvir) and schedule follow-up blood work after 2 months of treatment to assess your viral load.

## 2024-11-23 NOTE — Progress Notes (Signed)
 Subjective   Taylor PIPPEN is a 43 y.o. female who presents for the following: Total body skin exam for skin cancer screening and mole check. The patient has spots, moles and lesions to be evaluated, some may be new or changing and the patient may have concern these could be cancer.. Patient is new patient  Today patient reports: Pt c/o raised flesh colored lesion on the forehead that she noticed over the summer.   Review of Systems:    No other skin or systemic complaints except as noted in HPI or Assessment and Plan.  The following portions of the chart were reviewed this encounter and updated as appropriate: medications, allergies, medical history  Relevant Medical History:  n/a   Objective  (SKPE) Well appearing patient in no apparent distress; mood and affect are within normal limits. Examination was performed of the: Full Skin Examination: scalp, head, eyes, ears, nose, lips, neck, chest, axillae, abdomen, back, buttocks, bilateral upper extremities, bilateral lower extremities, hands, feet, fingers, toes, fingernails, and toenails.   Examination notable for: SKIN EXAM, Angioma(s): Scattered red vascular papule(s)  , Lentigo/lentigines: Scattered pigmented macules that are tan to brown in color and are somewhat non-uniform in shape and concentrated in the sun-exposed areas, Nevus/nevi: Scattered well-demarcated, regular, pigmented macule(s) and/or papule(s)  , Seborrheic Keratosis(es): Stuck-on appearing keratotic papule(s) on the trunk, none  irritated with redness, crusting, edema, and/or partial avulsion, Actinic Damage/Elastosis: chronic sun damage: dyspigmentation, telangiectasia, and wrinkling  Milia - forehead  Examination limited by: n/a    Assessment & Plan  (SKAP)   SKIN CANCER SCREENING PERFORMED TODAY.  BENIGN SKIN FINDINGS  - Lentigines  - Seborrheic keratoses  - Hemangiomas   - Nevus/Multiple Benign Nevi  - Milia  - Discussed the diagnosis and reassured  the pt regarding the benign nature of this condition - Discussed extraction  - After discussion of risks, benefits, and alternative, patient opts for cosmetic removal. Discussed that milia may recur. - Reassurance provided regarding the benign appearance of lesions noted on exam today; no treatment is indicated in the absence of symptoms/changes. - Reinforced importance of photoprotective strategies including liberal and frequent sunscreen use of a broad-spectrum SPF 30 or greater, use of protective clothing, and sun avoidance for prevention of cutaneous malignancy and photoaging.  Counseled patient on the importance of regular self-skin monitoring as well as routine clinical skin examinations as scheduled.   ACTINIC DAMAGE - Chronic condition, secondary to cumulative UV/sun exposure - Recommend daily broad spectrum sunscreen SPF 30+ to sun-exposed areas, reapply every 2 hours as needed.  - Staying in the shade or wearing long sleeves, sun glasses (UVA+UVB protection) and wide brim hats (4-inch brim around the entire circumference of the hat) are also recommended for sun protection.  - Call for new or changing lesions.  FAMILY HISTORY OF SKIN CANCER What type(s): unknown Who affected: father   FACIAL ELASTOSIS Exam: Rhytides and volume loss.  Treatment Plan: Discussed Botox, may schedule for consult.   Recommend daily broad spectrum sunscreen SPF 30+ to sun-exposed areas, reapply every 2 hours as needed. Call for new or changing lesions.  Staying in the shade or wearing long sleeves, sun glasses (UVA+UVB protection) and wide brim hats (4-inch brim around the entire circumference of the hat) are also recommended for sun protection.    Level of service outlined above   Patient instructions (SKPI)   Procedures, orders, diagnosis for this visit:    There are no diagnoses linked to this  encounter.  Return to clinic: Return for TBSE in 1-2 years.  LILLETTE Rosina Mayans, CMA, am acting as  scribe for Lauraine JAYSON Kanaris, MD .   Documentation: I have reviewed the above documentation for accuracy and completeness, and I agree with the above.  Lauraine JAYSON Kanaris, MD
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
## 2024-11-23 NOTE — Patient Instructions (Signed)

## 2024-11-24 LAB — HCV FIBROSURE
ALPHA 2-MACROGLOBULINS, QN: 222 mg/dL (ref 110–276)
ALT (SGPT) P5P: 216 IU/L — ABNORMAL HIGH (ref 0–40)
Apolipoprotein A-1: 153 mg/dL (ref 116–209)
Bilirubin, Total: 0.5 mg/dL (ref 0.0–1.2)
Fibrosis Score: 0.2 (ref 0.00–0.21)
GGT: 31 IU/L (ref 0–60)
Haptoglobin: 80 mg/dL (ref 42–296)
Necroinflammat Activity Score: 0.81 — ABNORMAL HIGH (ref 0.00–0.17)

## 2024-11-24 LAB — HEPATITIS B SURFACE ANTIBODY, QUANTITATIVE: Hep B S AB Quant (Post): 12922 m[IU]/mL

## 2024-11-24 LAB — HCV RNA QUANT
HCV Quantitative Log: 6.394 {Log_IU}/mL (ref >1.70)
HCV Quantitative: 2480000 [IU]/mL (ref >50)

## 2024-11-26 ENCOUNTER — Ambulatory Visit: Admission: RE | Admit: 2024-11-26 | Source: Ambulatory Visit

## 2024-11-26 LAB — HEPATITIS C GENOTYPE

## 2024-11-29 ENCOUNTER — Ambulatory Visit: Payer: Self-pay | Admitting: Pharmacist

## 2024-11-29 ENCOUNTER — Other Ambulatory Visit (HOSPITAL_COMMUNITY): Payer: Self-pay

## 2024-11-29 ENCOUNTER — Other Ambulatory Visit: Payer: Self-pay | Admitting: Pharmacist

## 2024-11-29 DIAGNOSIS — B182 Chronic viral hepatitis C: Secondary | ICD-10-CM

## 2024-11-29 MED ORDER — SOFOSBUVIR-VELPATASVIR 400-100 MG PO TABS
1.0000 | ORAL_TABLET | Freq: Every day | ORAL | 2 refills | Status: AC
  Filled 2024-12-01: qty 28, 28d supply, fill #0
  Filled 2024-12-21 – 2024-12-22 (×2): qty 28, 28d supply, fill #1
  Filled 2025-01-17 – 2025-01-26 (×2): qty 28, 28d supply, fill #2

## 2024-11-29 NOTE — Progress Notes (Signed)
 Sent!

## 2024-11-30 ENCOUNTER — Ambulatory Visit

## 2024-12-01 ENCOUNTER — Other Ambulatory Visit: Payer: Self-pay | Admitting: Pharmacist

## 2024-12-01 ENCOUNTER — Other Ambulatory Visit: Payer: Self-pay

## 2024-12-01 ENCOUNTER — Ambulatory Visit
Admission: RE | Admit: 2024-12-01 | Discharge: 2024-12-01 | Attending: Infectious Diseases | Admitting: Infectious Diseases

## 2024-12-01 ENCOUNTER — Other Ambulatory Visit (HOSPITAL_COMMUNITY): Payer: Self-pay

## 2024-12-01 ENCOUNTER — Telehealth: Payer: Self-pay | Admitting: Pharmacist

## 2024-12-01 DIAGNOSIS — B182 Chronic viral hepatitis C: Secondary | ICD-10-CM | POA: Insufficient documentation

## 2024-12-01 NOTE — Telephone Encounter (Signed)
 Patient is approved to receive Epclusa x 12 weeks for chronic Hepatitis C infection. Counseled patient to take Epclusa daily with or without food. Encouraged patient not to miss any doses and explained how their chance of cure could go down with each dose missed. Counseled patient on what to do if dose is missed - if it is closer to the missed dose take immediately; if closer to next dose skip dose and take the next dose at the usual time. Counseled patient on common side effects such as headache, fatigue, and nausea and that these normally decrease with time. I reviewed patient medications and found no drug interactions. Discussed with patient that there are several drug interactions including acid suppressants. Instructed patient to call clinic if she wishes to start a new medication during course of therapy. Also advised patient to call if she experiences any side effects. Patient will follow-up with Dr. Fayette.  Alan Geralds, PharmD, CPP, BCIDP, AAHIVP Clinical Pharmacist Practitioner Infectious Diseases Clinical Pharmacist [facility]

## 2024-12-01 NOTE — Progress Notes (Signed)
 Specialty Pharmacy Initiation Note   Taylor Wiggins is a 43 y.o. female who will be followed by the specialty pharmacy service for RxSp Hepatitis C    Review of administration, indication, effectiveness, safety, potential side effects, storage/disposable, and missed dose instructions occurred today for patient's specialty medication(s) Sofosbuvir-Velpatasvir     Patient/Caregiver did not have any additional questions or concerns.   Patient's therapy is appropriate to: Initiate    Goals Addressed             This Visit's Progress    Achieve virologic cure as evidenced by SVR       Patient is initiating therapy. Patient will be evaluated at upcoming provider appointment to assess progress      Comply with lab assessments       Patient is on track. Patient will adhere to provider and/or lab appointments      Maintain optimal adherence to therapy       Patient is initiating therapy. Patient will be evaluated at upcoming provider appointment to assess progress         Alan JINNY Geralds Specialty Pharmacist

## 2024-12-01 NOTE — Progress Notes (Signed)
 Specialty Pharmacy Initial Fill Coordination Note  Taylor Wiggins is a 43 y.o. female contacted today regarding initial fill of specialty medication(s) Sofosbuvir-Velpatasvir   Patient requested Delivery   Delivery date: 12/03/24   Verified address: [address] [place] [address]   Medication will be filled on 12/02/24.   Patient is aware of 4.00 copayment.

## 2024-12-02 ENCOUNTER — Other Ambulatory Visit: Payer: Self-pay

## 2024-12-06 ENCOUNTER — Other Ambulatory Visit (HOSPITAL_COMMUNITY): Payer: Self-pay

## 2024-12-21 ENCOUNTER — Other Ambulatory Visit: Payer: Self-pay

## 2024-12-22 ENCOUNTER — Other Ambulatory Visit: Payer: Self-pay

## 2024-12-24 ENCOUNTER — Other Ambulatory Visit: Payer: Self-pay

## 2024-12-24 ENCOUNTER — Other Ambulatory Visit (HOSPITAL_COMMUNITY): Payer: Self-pay

## 2024-12-24 NOTE — Progress Notes (Signed)
 Clinical Intervention Note  Clinical Intervention Notes: Patient reported starting gabapentin  and amoxicillin . No DDIs identified with sofosbuvir -velpatasvir    Clinical Intervention Outcomes: Prevention of an adverse drug event   Advertising Account Planner

## 2024-12-24 NOTE — Progress Notes (Signed)
 Specialty Pharmacy Refill Coordination Note  Spoke with Taylor Wiggins is a 43 y.o. female contacted today regarding refills of specialty medication(s) Sofosbuvir -Velpatasvir   Doses on hand: 9  Patient requested: Delivery   Delivery date: 12/28/24   Verified address: [address] [place] [address]  Medication will be filled on 12/27/24

## 2024-12-27 ENCOUNTER — Other Ambulatory Visit: Payer: Self-pay

## 2025-01-17 ENCOUNTER — Other Ambulatory Visit: Payer: Self-pay

## 2025-01-19 ENCOUNTER — Other Ambulatory Visit: Payer: Self-pay

## 2025-01-21 ENCOUNTER — Other Ambulatory Visit: Payer: Self-pay

## 2025-01-26 ENCOUNTER — Other Ambulatory Visit: Payer: Self-pay | Admitting: Pharmacy Technician

## 2025-01-26 ENCOUNTER — Other Ambulatory Visit: Payer: Self-pay

## 2025-01-26 NOTE — Progress Notes (Signed)
 Specialty Pharmacy Refill Coordination Note  Taylor Wiggins is a 44 y.o. female contacted today regarding refills of specialty medication(s) Sofosbuvir -Velpatasvir    Patient requested Delivery   Delivery date: 01/28/25   Verified address: [address] [place] [address]   Medication will be filled on: 01/27/25

## 2025-01-27 ENCOUNTER — Other Ambulatory Visit: Payer: Self-pay
